# Patient Record
Sex: Female | Born: 1955
Health system: Southern US, Community
[De-identification: ages and names within clinical notes are randomized; demographics above are authoritative.]

## PROBLEM LIST (undated history)

## (undated) DIAGNOSIS — G43909 Migraine, unspecified, not intractable, without status migrainosus: Secondary | ICD-10-CM

## (undated) DIAGNOSIS — E785 Hyperlipidemia, unspecified: Secondary | ICD-10-CM

## (undated) DIAGNOSIS — R112 Nausea with vomiting, unspecified: Secondary | ICD-10-CM

## (undated) DIAGNOSIS — F329 Major depressive disorder, single episode, unspecified: Secondary | ICD-10-CM

## (undated) DIAGNOSIS — F32A Depression, unspecified: Secondary | ICD-10-CM

## (undated) DIAGNOSIS — T7840XA Allergy, unspecified, initial encounter: Secondary | ICD-10-CM

## (undated) DIAGNOSIS — B019 Varicella without complication: Secondary | ICD-10-CM

## (undated) DIAGNOSIS — R569 Unspecified convulsions: Secondary | ICD-10-CM

## (undated) DIAGNOSIS — F419 Anxiety disorder, unspecified: Secondary | ICD-10-CM

## (undated) DIAGNOSIS — Z8489 Family history of other specified conditions: Secondary | ICD-10-CM

## (undated) DIAGNOSIS — Z9889 Other specified postprocedural states: Secondary | ICD-10-CM

## (undated) DIAGNOSIS — R519 Headache, unspecified: Secondary | ICD-10-CM

## (undated) DIAGNOSIS — Z9289 Personal history of other medical treatment: Secondary | ICD-10-CM

## (undated) DIAGNOSIS — M810 Age-related osteoporosis without current pathological fracture: Secondary | ICD-10-CM

## (undated) DIAGNOSIS — R51 Headache: Secondary | ICD-10-CM

## (undated) DIAGNOSIS — I10 Essential (primary) hypertension: Secondary | ICD-10-CM

## (undated) DIAGNOSIS — K219 Gastro-esophageal reflux disease without esophagitis: Secondary | ICD-10-CM

## (undated) DIAGNOSIS — H269 Unspecified cataract: Secondary | ICD-10-CM

## (undated) DIAGNOSIS — M1990 Unspecified osteoarthritis, unspecified site: Secondary | ICD-10-CM

## (undated) DIAGNOSIS — J449 Chronic obstructive pulmonary disease, unspecified: Secondary | ICD-10-CM

## (undated) HISTORY — DX: Hyperlipidemia, unspecified: E78.5

## (undated) HISTORY — PX: EYE SURGERY: SHX253

## (undated) HISTORY — DX: Gastro-esophageal reflux disease without esophagitis: K21.9

## (undated) HISTORY — PX: WISDOM TOOTH EXTRACTION: SHX21

## (undated) HISTORY — DX: Varicella without complication: B01.9

## (undated) HISTORY — PX: HERNIA REPAIR: SHX51

## (undated) HISTORY — DX: Headache, unspecified: R51.9

## (undated) HISTORY — DX: Unspecified cataract: H26.9

## (undated) HISTORY — DX: Headache: R51

## (undated) HISTORY — DX: Age-related osteoporosis without current pathological fracture: M81.0

## (undated) HISTORY — DX: Chronic obstructive pulmonary disease, unspecified: J44.9

## (undated) HISTORY — DX: Unspecified osteoarthritis, unspecified site: M19.90

## (undated) HISTORY — PX: APPENDECTOMY: SHX54

## (undated) HISTORY — DX: Personal history of other medical treatment: Z92.89

## (undated) HISTORY — DX: Allergy, unspecified, initial encounter: T78.40XA

## (undated) HISTORY — DX: Depression, unspecified: F32.A

## (undated) HISTORY — DX: Migraine, unspecified, not intractable, without status migrainosus: G43.909

## (undated) HISTORY — DX: Essential (primary) hypertension: I10

## (undated) HISTORY — DX: Unspecified convulsions: R56.9

## (undated) HISTORY — DX: Anxiety disorder, unspecified: F41.9

## (undated) HISTORY — PX: CATARACT EXTRACTION, BILATERAL: SHX1313

## (undated) HISTORY — PX: JOINT REPLACEMENT: SHX530

## (undated) HISTORY — DX: Major depressive disorder, single episode, unspecified: F32.9

---

## 1982-02-03 HISTORY — PX: APPENDECTOMY: SHX54

## 1982-02-03 HISTORY — PX: TOTAL ABDOMINAL HYSTERECTOMY: SHX209

## 1982-02-03 HISTORY — PX: ABDOMINAL HYSTERECTOMY: SHX81

## 1999-06-03 ENCOUNTER — Encounter: Admission: RE | Admit: 1999-06-03 | Discharge: 1999-06-03 | Payer: Self-pay | Admitting: Obstetrics and Gynecology

## 1999-06-03 ENCOUNTER — Encounter: Payer: Self-pay | Admitting: Obstetrics and Gynecology

## 2005-02-14 ENCOUNTER — Emergency Department: Payer: Self-pay | Admitting: Emergency Medicine

## 2005-07-01 ENCOUNTER — Emergency Department: Payer: Self-pay | Admitting: Internal Medicine

## 2007-03-10 ENCOUNTER — Emergency Department: Payer: Self-pay | Admitting: Emergency Medicine

## 2007-06-09 ENCOUNTER — Ambulatory Visit: Payer: Self-pay

## 2009-05-06 ENCOUNTER — Emergency Department: Payer: Self-pay | Admitting: Emergency Medicine

## 2010-03-08 ENCOUNTER — Emergency Department: Payer: Self-pay | Admitting: Emergency Medicine

## 2010-09-15 ENCOUNTER — Emergency Department: Payer: Self-pay | Admitting: Unknown Physician Specialty

## 2011-01-30 ENCOUNTER — Emergency Department: Payer: Self-pay | Admitting: Emergency Medicine

## 2011-08-27 ENCOUNTER — Emergency Department: Payer: Self-pay | Admitting: Emergency Medicine

## 2011-08-27 LAB — CBC WITH DIFFERENTIAL/PLATELET
Basophil #: 0 10*3/uL (ref 0.0–0.1)
Basophil %: 0.4 %
Eosinophil #: 0.2 10*3/uL (ref 0.0–0.7)
Eosinophil %: 2.6 %
HCT: 42.5 % (ref 35.0–47.0)
HGB: 13.8 g/dL (ref 12.0–16.0)
Lymphocyte #: 1.7 10*3/uL (ref 1.0–3.6)
Lymphocyte %: 23.3 %
MCH: 32.3 pg (ref 26.0–34.0)
MCHC: 32.4 g/dL (ref 32.0–36.0)
MCV: 100 fL (ref 80–100)
Monocyte #: 0.3 x10 3/mm (ref 0.2–0.9)
Monocyte %: 4.3 %
Neutrophil #: 4.9 10*3/uL (ref 1.4–6.5)
Neutrophil %: 69.4 %
Platelet: 174 10*3/uL (ref 150–440)
RBC: 4.26 10*6/uL (ref 3.80–5.20)
RDW: 13.7 % (ref 11.5–14.5)
WBC: 7.1 10*3/uL (ref 3.6–11.0)

## 2011-08-27 LAB — COMPREHENSIVE METABOLIC PANEL
Albumin: 4 g/dL (ref 3.4–5.0)
Alkaline Phosphatase: 93 U/L (ref 50–136)
Anion Gap: 4 — ABNORMAL LOW (ref 7–16)
BUN: 14 mg/dL (ref 7–18)
Bilirubin,Total: 0.3 mg/dL (ref 0.2–1.0)
Calcium, Total: 8.7 mg/dL (ref 8.5–10.1)
Chloride: 106 mmol/L (ref 98–107)
Co2: 28 mmol/L (ref 21–32)
Creatinine: 0.82 mg/dL (ref 0.60–1.30)
EGFR (African American): >60
EGFR (Non-African Amer.): >60
Glucose: 108 mg/dL — ABNORMAL HIGH (ref 65–99)
Osmolality: 277 (ref 275–301)
Potassium: 3.8 mmol/L (ref 3.5–5.1)
SGOT(AST): 31 U/L (ref 15–37)
SGPT (ALT): 25 U/L
Sodium: 138 mmol/L (ref 136–145)
Total Protein: 8 g/dL (ref 6.4–8.2)

## 2012-02-04 ENCOUNTER — Emergency Department: Payer: Self-pay | Admitting: Emergency Medicine

## 2012-02-04 LAB — CBC WITH DIFFERENTIAL/PLATELET
Basophil #: 0.1 10*3/uL (ref 0.0–0.1)
Basophil %: 0.9 %
Eosinophil #: 0.1 10*3/uL (ref 0.0–0.7)
Eosinophil %: 0.9 %
HCT: 42.9 % (ref 35.0–47.0)
HGB: 15 g/dL (ref 12.0–16.0)
Lymphocyte #: 1.2 10*3/uL (ref 1.0–3.6)
Lymphocyte %: 16.9 %
MCH: 33.8 pg (ref 26.0–34.0)
MCHC: 35 g/dL (ref 32.0–36.0)
MCV: 97 fL (ref 80–100)
Monocyte #: 0.3 x10 3/mm (ref 0.2–0.9)
Monocyte %: 4.1 %
Neutrophil #: 5.2 10*3/uL (ref 1.4–6.5)
Neutrophil %: 77.2 %
Platelet: 179 10*3/uL (ref 150–440)
RBC: 4.44 10*6/uL (ref 3.80–5.20)
RDW: 13.1 % (ref 11.5–14.5)
WBC: 6.8 10*3/uL (ref 3.6–11.0)

## 2012-02-04 LAB — COMPREHENSIVE METABOLIC PANEL
Albumin: 4.3 g/dL (ref 3.4–5.0)
Alkaline Phosphatase: 88 U/L (ref 50–136)
Anion Gap: 12 (ref 7–16)
BUN: 10 mg/dL (ref 7–18)
Bilirubin,Total: 0.3 mg/dL (ref 0.2–1.0)
Calcium, Total: 9.3 mg/dL (ref 8.5–10.1)
Chloride: 104 mmol/L (ref 98–107)
Co2: 21 mmol/L (ref 21–32)
Creatinine: 1.08 mg/dL (ref 0.60–1.30)
EGFR (African American): >60
EGFR (Non-African Amer.): 58 — ABNORMAL LOW
Glucose: 88 mg/dL (ref 65–99)
Osmolality: 272 (ref 275–301)
Potassium: 3.1 mmol/L — ABNORMAL LOW (ref 3.5–5.1)
SGOT(AST): 20 U/L (ref 15–37)
SGPT (ALT): 14 U/L (ref 12–78)
Sodium: 137 mmol/L (ref 136–145)
Total Protein: 8.7 g/dL — ABNORMAL HIGH (ref 6.4–8.2)

## 2012-02-04 LAB — DRUG SCREEN, URINE
Amphetamines, Ur Screen: NEGATIVE (ref ?–1000)
Barbiturates, Ur Screen: NEGATIVE (ref ?–200)
Benzodiazepine, Ur Scrn: NEGATIVE (ref ?–200)
Cannabinoid 50 Ng, Ur ~~LOC~~: POSITIVE (ref ?–50)
Cocaine Metabolite,Ur ~~LOC~~: NEGATIVE (ref ?–300)
MDMA (Ecstasy)Ur Screen: NEGATIVE (ref ?–500)
Methadone, Ur Screen: NEGATIVE (ref ?–300)
Opiate, Ur Screen: NEGATIVE (ref ?–300)
Phencyclidine (PCP) Ur S: NEGATIVE (ref ?–25)
Tricyclic, Ur Screen: NEGATIVE (ref ?–1000)

## 2012-02-04 LAB — URINALYSIS, COMPLETE
Bacteria: NONE SEEN
Bilirubin,UR: NEGATIVE
Glucose,UR: NEGATIVE mg/dL (ref 0–75)
Hyaline Cast: 3
Ketone: NEGATIVE
Nitrite: NEGATIVE
Ph: 6 (ref 4.5–8.0)
Protein: 30
RBC,UR: 4 /HPF (ref 0–5)
Specific Gravity: 1.016 (ref 1.003–1.030)
Squamous Epithelial: 7
WBC UR: 6 /HPF (ref 0–5)

## 2012-07-01 ENCOUNTER — Inpatient Hospital Stay: Payer: Self-pay | Admitting: Internal Medicine

## 2012-07-01 LAB — COMPREHENSIVE METABOLIC PANEL
Albumin: 4.3 g/dL (ref 3.4–5.0)
Alkaline Phosphatase: 96 U/L (ref 50–136)
Anion Gap: 15 (ref 7–16)
BUN: 12 mg/dL (ref 7–18)
Bilirubin,Total: 0.3 mg/dL (ref 0.2–1.0)
Calcium, Total: 9 mg/dL (ref 8.5–10.1)
Chloride: 110 mmol/L — ABNORMAL HIGH (ref 98–107)
Co2: 16 mmol/L — ABNORMAL LOW (ref 21–32)
Creatinine: 1.26 mg/dL (ref 0.60–1.30)
EGFR (African American): 55 — ABNORMAL LOW
EGFR (Non-African Amer.): 48 — ABNORMAL LOW
Glucose: 132 mg/dL — ABNORMAL HIGH (ref 65–99)
Osmolality: 283 (ref 275–301)
Potassium: 3.3 mmol/L — ABNORMAL LOW (ref 3.5–5.1)
SGOT(AST): 43 U/L — ABNORMAL HIGH (ref 15–37)
SGPT (ALT): 21 U/L (ref 12–78)
Sodium: 141 mmol/L (ref 136–145)
Total Protein: 8.5 g/dL — ABNORMAL HIGH (ref 6.4–8.2)

## 2012-07-01 LAB — DRUG SCREEN, URINE
Barbiturates, Ur Screen: NEGATIVE (ref ?–200)
Benzodiazepine, Ur Scrn: NEGATIVE (ref ?–200)
Cannabinoid 50 Ng, Ur ~~LOC~~: POSITIVE (ref ?–50)
Cocaine Metabolite,Ur ~~LOC~~: NEGATIVE (ref ?–300)
MDMA (Ecstasy)Ur Screen: NEGATIVE (ref ?–500)
Methadone, Ur Screen: NEGATIVE (ref ?–300)
Opiate, Ur Screen: NEGATIVE (ref ?–300)
Phencyclidine (PCP) Ur S: NEGATIVE (ref ?–25)
Tricyclic, Ur Screen: NEGATIVE (ref ?–1000)

## 2012-07-01 LAB — CBC
HCT: 42.1 % (ref 35.0–47.0)
HGB: 14.1 g/dL (ref 12.0–16.0)
MCH: 33.1 pg (ref 26.0–34.0)
MCHC: 33.6 g/dL (ref 32.0–36.0)
MCV: 99 fL (ref 80–100)
Platelet: 231 10*3/uL (ref 150–440)
RBC: 4.27 10*6/uL (ref 3.80–5.20)
RDW: 13.8 % (ref 11.5–14.5)
WBC: 21.1 10*3/uL — ABNORMAL HIGH (ref 3.6–11.0)

## 2012-07-01 LAB — URINALYSIS, COMPLETE
Bacteria: NONE SEEN
Nitrite: NEGATIVE
Ph: 5 (ref 4.5–8.0)
Protein: NEGATIVE
RBC,UR: 6 /HPF (ref 0–5)
Squamous Epithelial: <1

## 2012-07-01 LAB — TSH: Thyroid Stimulating Horm: 1.37 u[IU]/mL

## 2012-07-01 LAB — MAGNESIUM: Magnesium: 1.7 mg/dL — ABNORMAL LOW

## 2012-07-01 LAB — ETHANOL: Ethanol: <3 mg/dL

## 2012-07-01 LAB — ACETAMINOPHEN LEVEL: Acetaminophen: <2 ug/mL

## 2012-07-02 LAB — BASIC METABOLIC PANEL
Anion Gap: 4 — ABNORMAL LOW (ref 7–16)
Calcium, Total: 8.5 mg/dL (ref 8.5–10.1)
Potassium: 3.7 mmol/L (ref 3.5–5.1)
Sodium: 141 mmol/L (ref 136–145)

## 2012-07-02 LAB — WBC: WBC: 12.8 10*3/uL — ABNORMAL HIGH (ref 3.6–11.0)

## 2012-07-03 ENCOUNTER — Ambulatory Visit: Payer: Self-pay | Admitting: Neurology

## 2012-07-19 ENCOUNTER — Ambulatory Visit: Payer: Self-pay | Admitting: Family Medicine

## 2013-02-03 LAB — HM MAMMOGRAPHY: HM MAMMO: NORMAL (ref 0–4)

## 2013-08-28 ENCOUNTER — Emergency Department: Payer: Self-pay | Admitting: Internal Medicine

## 2013-08-28 LAB — COMPREHENSIVE METABOLIC PANEL
ALBUMIN: 4.2 g/dL (ref 3.4–5.0)
ALK PHOS: 64 U/L
ANION GAP: 4 — AB (ref 7–16)
AST: 23 U/L (ref 15–37)
BUN: 13 mg/dL (ref 7–18)
Bilirubin,Total: 0.3 mg/dL (ref 0.2–1.0)
Calcium, Total: 8.8 mg/dL (ref 8.5–10.1)
Chloride: 101 mmol/L (ref 98–107)
Co2: 34 mmol/L — ABNORMAL HIGH (ref 21–32)
Creatinine: 1.27 mg/dL (ref 0.60–1.30)
EGFR (African American): 54 — ABNORMAL LOW
GFR CALC NON AF AMER: 47 — AB
Glucose: 91 mg/dL (ref 65–99)
Osmolality: 277 (ref 275–301)
Potassium: 3.5 mmol/L (ref 3.5–5.1)
SGPT (ALT): 16 U/L
SODIUM: 139 mmol/L (ref 136–145)
TOTAL PROTEIN: 7.8 g/dL (ref 6.4–8.2)

## 2013-08-28 LAB — CBC
HCT: 41.1 % (ref 35.0–47.0)
HGB: 13.7 g/dL (ref 12.0–16.0)
MCH: 33.1 pg (ref 26.0–34.0)
MCHC: 33.4 g/dL (ref 32.0–36.0)
MCV: 99 fL (ref 80–100)
Platelet: 194 10*3/uL (ref 150–440)
RBC: 4.16 10*6/uL (ref 3.80–5.20)
RDW: 13 % (ref 11.5–14.5)
WBC: 6.7 10*3/uL (ref 3.6–11.0)

## 2013-08-28 LAB — SEDIMENTATION RATE: Erythrocyte Sed Rate: 12 mm/hr (ref 0–30)

## 2013-08-29 DIAGNOSIS — F32A Depression, unspecified: Secondary | ICD-10-CM | POA: Insufficient documentation

## 2013-08-29 DIAGNOSIS — F329 Major depressive disorder, single episode, unspecified: Secondary | ICD-10-CM | POA: Insufficient documentation

## 2013-08-29 DIAGNOSIS — R9402 Abnormal brain scan: Secondary | ICD-10-CM | POA: Insufficient documentation

## 2013-09-24 ENCOUNTER — Emergency Department: Payer: Self-pay | Admitting: Emergency Medicine

## 2013-10-06 ENCOUNTER — Ambulatory Visit: Payer: Self-pay | Admitting: Neurology

## 2013-11-04 ENCOUNTER — Emergency Department: Payer: Self-pay | Admitting: Emergency Medicine

## 2014-01-10 ENCOUNTER — Ambulatory Visit: Payer: Self-pay | Admitting: Family Medicine

## 2014-05-26 NOTE — Discharge Summary (Signed)
PATIENT NAME:  Kayla Shah, Kayla Shah MR#:  [id number] DATE OF BIRTH:  1955-11-20  DATE OF ADMISSION:  07/01/2012 DATE OF DISCHARGE:  07/03/2012  PRIMARY CARE PHYSICIAN:  Dr. Brunetta Genera.   DISCHARGE DIAGNOSES: 1.  Generalized tonic-clonic seizure.  2.  Tobacco abuse.  4.  Depression.  5.  Postictal encephalopathy.   CONSULTATIONS:  Dr. Irish Elders of neurology.   IMAGING STUDIES DONE:  Include a CT scan of the head which showed no acute abnormalities.   PROCEDURES:  An EEG done showed abnormal occipital left temporal more than left parietal spikes and two short-lasting seizures with rapid secondary generalization.  This was consistent with cortical irritability and increased predisposition to focal onset epilepsy from these locations.   ADMITTING HISTORY AND PHYSICAL:  Please see detailed H and P dictated by Dr. Posey Pronto on 07/01/2012.  In brief, a 59 year old Caucasian female patient was brought to the Emergency Room by EMS after the patient's husband found her curled up on the floor for unknown amount of time.  The patient was confused and this was similar to her prior episode of having seizure six months prior, was brought to the Emergency Room.  In the Emergency Room, the patient had a generalized tonic-clonic seizure witnessed by the ER physician, was admitted to the hospitalist service for further work-up and treatment.   HOSPITAL COURSE:  The patient was started on Keppra IV twice daily as she was confused, unable to take anything oral after loading a Keppra dose.  The patient did not have any further seizures.  An EEG showed seizure-like activity after which Dr. Irish Elders of neurology was consulted who suggested patient can be discharged home as seizures had resolved, but she will need an MRI as outpatient and this was scheduled.  The patient will follow up with Dr. Manuella Ghazi of neurology in 2 to 3 weeks.  This appointment could not obtained as it is the weekend, but patient will call Dr. Trena Platt office,   The patient was advised to stop taking her tramadol which could have caused her seizure and lower the threshold of seizures.  Also, the patient was counseled extensively to quit using marijuana which could be the other cause of her precipitation of seizures.   Today the patient's neurological examination shows motor strength 5 by 5 in upper and lower extremities.  Sensation is intact all over.  The patient is alert and oriented x 3 and is being discharged home in a fair condition.   DISCHARGE MEDICATIONS:  Include:  1.  Simvastatin 20 mg oral once a day.  2.  Seroquel 50 mg oral once a day at bedtime.  3.  Celexa 20 mg oral once a day.  4.  Keppra 500 mg oral 2 times a day.  5.  Acetaminophen 650 mg oral every 4 hours as needed for pain or fever.   DISCHARGE INSTRUCTIONS:  Regular diet.  Activity as tolerated.  Call her doctor or return to the Emergency Room if any further seizures.  Follow up with Dr. Manuella Ghazi of neurology in 2 to 3 weeks.  The patient has been given a lab slip to check her MRI of the brain with and without contrast for which she will return and get the MRI.   Time spent on day of discharge in discharge activity was 40 minutes.     ____________________________ Leia Alf Kazuki Ingle, MD srs:ea D: 07/03/2012 15:36:42 ET T: 07/03/2012 20:06:26 ET JOB#: [id number]  cc: Alveta Heimlich R. Ellee Wawrzyniak, MD, <Dictator> Meindert A. Brunetta Genera, [place]  Arlice Colt MD ELECTRONICALLY SIGNED 07/27/2012 10:44

## 2014-05-26 NOTE — Consult Note (Signed)
PATIENT NAME:  Kayla Shah, Kayla Shah MR#:  [id number] DATE OF BIRTH:  July 22, 1955  DATE OF CONSULTATION:  07/03/2012  REFERRING PHYSICIAN:   CONSULTING PHYSICIAN:  Leotis Pain, MD  REASON FOR CONSULTATION:  Seizures.   HISTORY OF PRESENT ILLNESS: This is a 59 year old Caucasian female brought to Emergency Department via EMS after the patient's husband found her curled up on the floor, unknown amount of time. The patient was had altered mental status.  Upon presenting to the  Emergency Department, she appeared confused and had a generalized tonic-clonic seizure, which resolved with IV Ativan.  At that time, the patient appeared postictal and had difficulty following commands. She is status post Keppra load of 500 mg x1.  Throughout the course of the stay, the patient's symptoms are very much improved.  She appears to be close to baseline.  She has a history of chronic headaches and she has been on tramadol, which can lower seizure threshold, which is held at this time. She is also a chronic tobacco smoker of 1 pack per day. She smokes marijuana almost daily, or as she states, whenever she can afford it. She has a history of seizure that happened about six months ago.  She does remember the event and the type of seizure. Status post EEG that showed generalized spikes in the left  temporal, left occipital lobe with potentiation for seizure activity.   PAST MEDICAL HISTORY:  Depression, seizure disorder 6 months ago.  She has a suicide attempt in the past, history of total abdominal hysterectomy.   HOME MEDICATIONS: Include:  1.  Simvastatin.  2.  Seroquel.  3.  Celexa.  4. Tramadol, which is held at this point.   FAMILY HISTORY: No history of seizures.   SOCIAL HISTORY:  Per husband or the person who lives with her, she smokes about a pack per day. Denies any alcohol use. She is a daily marijuana smoker.   PHYSICAL EXAMINATION:  VITAL SIGNS: The patient's temperature is 99.1, pulse is 68,  respirations 18, blood pressure 129/79, pulse oximetry is 98%.   NEUROLOGICAL: The patient is alert, awake, oriented to time, place, and the reason why she is in the hospital. Cranial nerve examination:  The patient's sensation and motor are intact. Pupils 3 mm, 2 mm, reactive bilaterally. Extraocular movements are intact. Visual fields intact. Uvula elevates symmetrically. Tongue is midline. Shoulder shrug intact bilaterally. Strength is 5/5 bilateral upper extremities   Chronic weakness in the right lower extremity, which is 5- out of 5 and the left lower extremity is 5/5.  Coordination: Finger-to-nose intact. Sensation intact to light touch and temperature.  Reflexes symmetrical throughout. Gait not assessed.   IMAGING:  CAT scan of the head did not show any acute intracranial pathology. Status post EEG that showed left occipital sharps, potentiation for seizure activity. The patient's chemistry included glucose of 96, BUN of 12, creatinine of 0.89, chloride of 113.  The patient white blood cell count is 12.8, suspected because of seizure activity.   IMPRESSION: A 59 year old female presenting with generalized seizure activity.  This is  suspected a second seizure activity of her life, status post CAT scan did not show any acute abnormalities. EEG as above, potentiation for seizure activity in left temporal and left occipital lobes.   PLAN: The patient should be on antiepileptics, Keppra 500 mg b.i.d. as her renal function is within normal limits.  The patient does not drive but was told not to drive anyway. No swimming alone, seizure precautions.  MRI of the brain, which can be done as an outpatient with and without contrast.  Discontinue tramadol as tramadol lowers the seizure threshold. The patient should follow up with neurology as an outpatient.  The patient was also consulted in terms of smoking and was told not to smoke marijuana daily like she does.  She smokes 1 pack per day so she was told to try  to decrease it or use nicotine patch. This patient was discussed with the patient's primary team.  It was a pleasure seeing this patient.  ____________________________ Leotis Pain, MD yz:mw D: 07/03/2012 12:57:15 ET T: 07/03/2012 18:12:06 ET JOB#: [id number]  cc: Leotis Pain, MD, <Dictator> Leotis Pain MD ELECTRONICALLY SIGNED 08/07/2012 11:40

## 2014-05-26 NOTE — H&P (Signed)
PATIENT NAME:  ANYJAH, ROUNDTREE MR#:  [id number] DATE OF BIRTH:  [birth year]  DATE OF ADMISSION:  07/01/2012  PRIMARY CARE PHYSICIAN:  Dr.  Brunetta Genera  CHIEF COMPLAINT:  Altered mental status, possible seizures.   HISTORY OF PRESENT ILLNESS:  Miss Soots is a 59 year old Caucasian female, who is brought into the Emergency Room by EMS after patient's husband found her curled up on  the floor for unknown amount of time, with altered mental status, and brought to the Emergency Room. Thereafter,  remained confused and had a generalized convulsion in the Emergency Room, witnessed by the ER physician. She received IV Ativan. She is currently postictal during my evaluation; however, is getting restless and somewhat agitated, trying to pull IV lines and telemetry cords. The patient is being admitted for further evaluation and management.   Per husband, according to his knowledge, the patient does not do any drugs or drink alcohol. She is an excessive tobacco abuser. Per patient's husband, he does not think she has any suicidal ideation. He tells me she has "the most pleasant personality". The patient does not see a psychiatrist. Her depression is managed by a primary care physician.   PAST MEDICAL HISTORY: 1.  Depression.  2.  Seizure one time, last New Year's Eve, patient had a mild seizure, which was apparently thought to be due to her running out of her antidepressant meds.  3.  Suicidal attempt in the past.  4.  Depression.  5.  Total abdominal hysterectomy.   MEDICATIONS: 1.  Simvastatin 20 mg daily.  2.  Seroquel 50 mg at bedtime.  3.  Celexa 20 mg daily.  4.  Tramadol 50 mg 2 times a day.   ALLERGIES:  No known drug allergies.   SOCIAL HISTORY:  Per husband, smokes about a pack a day. Denies any alcohol use.   FAMILY HISTORY:  Unobtainable.  REVIEW OF SYSTEMS:  Unobtainable.  PHYSICAL EXAMINATION: GENERAL: The patient is unresponsive. Moves all extremities well, on sternal rub, she gets  somewhat agitated. She is afebrile, pulse is 96, blood pressure 145/77, sats are 96% on room air.  HEENT: Atraumatic, normocephalic. PERLA.  EOM intact. Oral mucosa is moist. There are some cracked, chapped lips present. I could not assess for tongue bite.  NECK:  Supple. No JVD. No carotid bruit.  RESPIRATORY:  Clear to auscultation bilaterally. No rales, rhonchi, respiratory distress or labored breathing.  CARDIOVASCULAR:  Both the heart sounds are normal. Rate, rhythm regular. PMI not lateralized. Chest nontender. Good pedal pulses, good femoral pulses. No lower extremity edema.  ABDOMEN:  Soft, benign. No organomegaly felt. Positive bowel sounds.  NEUROLOGIC:  Exam limited secondary to patient's altered mental status and being postictal. She moves all her extremities well. No obvious focal deficit noted.  SKIN:  Warm and dry. The patient does have some bruises over her knees and over her elbows.   LABS: Urinalysis negative for UTI. Urine drug screen positive for cannabinoids. CT of the head: No evidence of acute abnormality. CT of the cervical spine without contrast shows multilevel spondylolysis. No evidence of acute osseous abnormality. Serum acetaminophen less than 2.0. Creatinine is 1.26, sodium is 141, potassium is 1.3, chloride is 110. Her SGOT is 43. CBC within normal limits, except white count of 21.1.  Magnesium 1.7. Salicylates  9.4. TSH is 137. EKG: Sinus rhythm with voltage criteria of left ventricular hypertrophy, nonspecific ST-T abnormality.   ASSESSMENT:  A 59 year old [place], who presents to the Emergency Room with  altered mental status and found curled over on the floor by her husband, brought in with:   1.  Generalized seizures. The patient's seizure tonic convulsion was witnessed by ER MD. She is postictal at this time. Received IV Ativan. CT head is negative. Will admit patient on medical floor, take seizure precautions. Will give IV Keppra 500 mg b.i.d. Neurology  consultation in the morning. Will order EEG in the morning as well. This could be possibly tramadol-induced as well. I will hold off on it. No history of drug abuse or drug intake known to family. However, her drug screen was positive for cannabinoids. She also has mild elevated salicylate level, which we will continue to follow.   2.  Hypokalemia, hyperchloremia. I will give IV fluids, replace potassium, and follow up labs.   3.  Depression, on Seroquel. Will hold off on it at this time until patient is awake.   4. Tobacco abuse. Will counsel patient, once she is awake enough to follow commands.   5.  Deep vein thrombosis prophylaxis with subcu Lovenox.   Further workup according to patient's clinical course. Hospital admission plan was discussed with patient's family members, who were present in the Emergency Room.   TIME SPENT:  55 minutes.    ____________________________ Hart Rochester Posey Pronto, MD sap:mr D: 07/01/2012 19:44:49 ET T: 07/01/2012 20:11:34 ET JOB#: [id number]  cc: Yavuz Kirby A. Posey Pronto, MD, <Dictator> Meinder[place] A. Brunetta Genera, MD  Ilda Basset MD ELECTRONICALLY SIGNED 07/08/2012 19:35

## 2014-08-14 DIAGNOSIS — J4489 Other specified chronic obstructive pulmonary disease: Secondary | ICD-10-CM | POA: Insufficient documentation

## 2014-08-14 DIAGNOSIS — E7800 Pure hypercholesterolemia, unspecified: Secondary | ICD-10-CM | POA: Insufficient documentation

## 2014-08-14 DIAGNOSIS — I10 Essential (primary) hypertension: Secondary | ICD-10-CM | POA: Insufficient documentation

## 2014-08-14 DIAGNOSIS — J449 Chronic obstructive pulmonary disease, unspecified: Secondary | ICD-10-CM | POA: Insufficient documentation

## 2015-05-09 ENCOUNTER — Telehealth: Payer: Self-pay | Admitting: Family Medicine

## 2015-05-09 NOTE — Telephone Encounter (Signed)
Received a call from [facility]  requesting when pt. Come in for appt for new pt  For  You to order a colonoscopy .  [employer]  Call back # is  [phone].

## 2015-05-10 NOTE — Telephone Encounter (Signed)
Did this patient schedule an appt?  I would be happy to help her get her colonoscopy scheduled at her new patient appt. Thanks!

## 2015-06-07 ENCOUNTER — Ambulatory Visit (INDEPENDENT_AMBULATORY_CARE_PROVIDER_SITE_OTHER): Payer: Medicare Other | Admitting: Family Medicine

## 2015-06-07 ENCOUNTER — Encounter: Payer: Self-pay | Admitting: Family Medicine

## 2015-06-07 VITALS — BP 136/86 | HR 73 | Temp 98.9°F | Resp 16 | Ht 65.0 in | Wt 128.4 lb

## 2015-06-07 DIAGNOSIS — I10 Essential (primary) hypertension: Secondary | ICD-10-CM | POA: Diagnosis not present

## 2015-06-07 DIAGNOSIS — Z7189 Other specified counseling: Secondary | ICD-10-CM | POA: Diagnosis not present

## 2015-06-07 DIAGNOSIS — R569 Unspecified convulsions: Secondary | ICD-10-CM | POA: Diagnosis not present

## 2015-06-07 DIAGNOSIS — E7800 Pure hypercholesterolemia, unspecified: Secondary | ICD-10-CM

## 2015-06-07 DIAGNOSIS — F17200 Nicotine dependence, unspecified, uncomplicated: Secondary | ICD-10-CM | POA: Insufficient documentation

## 2015-06-07 DIAGNOSIS — F329 Major depressive disorder, single episode, unspecified: Secondary | ICD-10-CM | POA: Diagnosis not present

## 2015-06-07 DIAGNOSIS — K219 Gastro-esophageal reflux disease without esophagitis: Secondary | ICD-10-CM | POA: Insufficient documentation

## 2015-06-07 DIAGNOSIS — G43909 Migraine, unspecified, not intractable, without status migrainosus: Secondary | ICD-10-CM | POA: Insufficient documentation

## 2015-06-07 DIAGNOSIS — F32A Depression, unspecified: Secondary | ICD-10-CM

## 2015-06-07 DIAGNOSIS — Z1211 Encounter for screening for malignant neoplasm of colon: Secondary | ICD-10-CM

## 2015-06-07 DIAGNOSIS — E559 Vitamin D deficiency, unspecified: Secondary | ICD-10-CM

## 2015-06-07 DIAGNOSIS — Z720 Tobacco use: Secondary | ICD-10-CM

## 2015-06-07 DIAGNOSIS — Z1239 Encounter for other screening for malignant neoplasm of breast: Secondary | ICD-10-CM | POA: Diagnosis not present

## 2015-06-07 DIAGNOSIS — Z7689 Persons encountering health services in other specified circumstances: Secondary | ICD-10-CM

## 2015-06-07 MED ORDER — ALPRAZOLAM 1 MG PO TABS: 1.0000 mg | ORAL_TABLET | Freq: Three times a day (TID) | ORAL | Status: DC | PRN

## 2015-06-07 MED ORDER — VERAPAMIL HCL ER 240 MG PO TBCR: 240.0000 mg | EXTENDED_RELEASE_TABLET | Freq: Every day | ORAL | Status: DC

## 2015-06-07 MED ORDER — PANTOPRAZOLE SODIUM 40 MG PO TBEC: 40.0000 mg | DELAYED_RELEASE_TABLET | Freq: Every day | ORAL | Status: DC

## 2015-06-07 MED ORDER — SIMVASTATIN 20 MG PO TABS
20.0000 mg | ORAL_TABLET | Freq: Every day | ORAL | Status: DC
Start: 2015-06-07 — End: 2018-05-14

## 2015-06-07 MED ORDER — LEVETIRACETAM 500 MG PO TABS: 500.0000 mg | ORAL_TABLET | Freq: Two times a day (BID) | ORAL | Status: DC

## 2015-06-07 NOTE — Assessment & Plan Note (Signed)
Appears controlled with verapamil. Check CMET. Encouraged smoking cessation. Encouraged dash diet.

## 2015-06-07 NOTE — Patient Instructions (Signed)
Nausea- Try taking protonix in the morning first thing upon awakening. This may help nausea.   Depression and Anxiety: Let's try decreasing Xanax to 3 times daily while we wait for psychiatry. Continue not to take the seroquel at this time.   Please schedule mammogram at your convenience.   We will check labs. You can call Dr. Melrose Nakayama to determine if you need to continue the [place].

## 2015-06-07 NOTE — Assessment & Plan Note (Signed)
Encouraged smoking cessation. Referred to [facility]. Reviewed available resources.

## 2015-06-07 NOTE — Progress Notes (Signed)
Subjective:    Patient ID: Kayla Shah, female    DOB: [birth year], 60 y.o.   MRN: [id number]  HPI: Kayla Shah is a 60 y.o. female presenting on 06/07/2015 for Establish Care   HPI  Pt presents to establish care today. Previous care provider was Dr. Fulton Reek at [facility].   It has been 6 mos months since Her last PCP visit. Records from previous provider will be requested and reviewed. Current medical problems include:  Seizure disorder: Diagnosed 4 years ago. Currently on Keppra. Was being managed by her previous PCP. Has seen Dr. Melrose Nakayama in the past- has not seen since 2015. Last seizure was 4 years . Did EEG in office. Did not find anything.  Hypertension: Placed on BP medications 4 years ago. Doing well. Was placed verampil. Has helped with her headaches.  Acid Reflux: Diagnosed 5 years ago. Did not have EGD. Protonix controls symptoms. No dysphagia, regurg, or blood in stool or vomit. Has AM nausea. Takes protonix. Nausea in AM upon awakening.  Hyperlipidemia: Taking 20mg  simvastatin at bedtime. Has been on for 5-6 years. No leg cramping on myalgias. Tries to eat a heart healthy diet. Husband recently had an MI. Walks in the evenings. Does smoke.   Issue with frequent HA's- started verampil by Dr. Melrose Nakayama in 2015. Has not had issues in quite a while.  Arthritis: L knee and R foot. Doesn't bother her too badly. Stiff at times. Takes Aleve as needed.  Depression: Previously taking paxil- made her feel bad. Was placed on Seroquel by previous PCP- 2 seroquel per day is too much. Doesn't feel her antidepressants helps. Has been taking Xanax 4 times daily. Started on Valium in the 1980's. Endorse occasional dark thoughts. No plan for suicide. Does not want to die. Does not feel she is a danger to herself.   Disability- thinks it was due to mental state.  Health maintenance:  Smokes- 1/2 pack per day. Started smoking as a teenage. Smoked 1/2 PPD for 40 years.  Last mammogram- all normal.  Needs been 2 years.  Last pap Hysterectomy 1984- took cervix and ovaries. Last colonoscopy: Would like to do the cologuard.     Past Medical History  Diagnosis Date  . Allergy   . Arthritis   . Depression   . Headache   . GERD (gastroesophageal reflux disease)   . Hypertension   . Hyperlipidemia   . Migraine   . Seizure [facility])    Social History   Social History  . Marital Status: Divorced    Spouse Name: N/A  . Number of Children: N/A  . Years of Education: N/A   Occupational History  . Not on file.   Social History Main Topics  . Smoking status: Current Every Day Smoker -- 1.00 packs/day  . Smokeless tobacco: Not on file  . Alcohol Use: No  . Drug Use: No  . Sexual Activity: Not on file   Other Topics Concern  . Not on file   Social History Narrative  . No narrative on file   Family History  Problem Relation Age of Onset  . Stroke Mother   . Heart disease Father   . Osteoporosis Sister   . Kidney disease Sister    No current outpatient prescriptions on file prior to visit.   No current facility-administered medications on file prior to visit.    Review of Systems  Constitutional: Negative for fever and chills.  HENT: Negative.   Respiratory: Negative  for cough, chest tightness and wheezing.   Cardiovascular: Negative for chest pain and leg swelling.  Gastrointestinal: Positive for nausea. Negative for vomiting, abdominal pain, diarrhea and constipation.  Endocrine: Negative.  Negative for cold intolerance, heat intolerance, polydipsia, polyphagia and polyuria.  Genitourinary: Negative for dysuria and difficulty urinating.  Musculoskeletal: Negative.   Neurological: Negative for dizziness, seizures, light-headedness and numbness.  Psychiatric/Behavioral: Positive for dysphoric mood. The patient is nervous/anxious.    Per HPI unless specifically indicated above     Objective:    BP 136/86 mmHg  Pulse 73  Temp(Src) 98.9 F (37.2 C) (Oral)  Resp  16  Ht 5\' 5"  (1.651 m)  Wt 128 lb 6.4 oz (58.242 kg)  BMI 21.37 kg/m2  LMP   Wt Readings from Last 3 Encounters:  06/07/15 128 lb 6.4 oz (58.242 kg)    Depression screen PHQ 2/9 06/07/2015  Decreased Interest 1  Down, Depressed, Hopeless 1  PHQ - 2 Score 2  Altered sleeping 0  Tired, decreased energy 1  Change in appetite 1  Feeling bad or failure about yourself  1  Trouble concentrating 0  Moving slowly or fidgety/restless 1  Suicidal thoughts 1  PHQ-9 Score 7      Physical Exam  Constitutional: She is oriented to person, place, and time. She appears well-developed and well-nourished.  HENT:  Head: Normocephalic and atraumatic.  Neck: Neck supple.  Cardiovascular: Normal rate, regular rhythm and normal heart sounds.  Exam reveals no gallop and no friction rub.   No murmur heard. Pulmonary/Chest: Effort normal and breath sounds normal. She has no wheezes. She exhibits no tenderness.  Abdominal: Soft. Normal appearance and bowel sounds are normal. She exhibits no distension and no mass. There is no tenderness. There is no rebound and no guarding.  Musculoskeletal: Normal range of motion. She exhibits no edema or tenderness.  Lymphadenopathy:    She has no cervical adenopathy.  Neurological: She is alert and oriented to person, place, and time.  Skin: Skin is warm and dry.  Psychiatric: She has a normal mood and affect. Her speech is normal and behavior is normal. Judgment normal. Cognition and memory are normal. She expresses suicidal ideation. She expresses no suicidal plans.   Results for orders placed or performed in visit on 06/07/15  HM MAMMOGRAPHY  Result Value Ref Range   HM Mammogram Self Reported Normal 0-4 Bi-Rad, Self Reported Normal      Assessment & Plan:   Problem List Items Addressed This Visit      Cardiovascular and Mediastinum   Essential (primary) hypertension    Appears controlled with verapamil. Check CMET. Encouraged smoking cessation. Encouraged  dash diet.       Relevant Medications   verapamil (CALAN-SR) 240 MG CR tablet   simvastatin (ZOCOR) 20 MG tablet   Other Relevant Orders   Comprehensive metabolic panel     Digestive   Gastroesophageal reflux disease without esophagitis    Continue protonix. Check CBC. Trial of taking medication firs thing in AM to help with nausea. Consider GI referral if symptoms don't improve.       Relevant Medications   dimenhyDRINATE (DRAMAMINE) 50 MG tablet   pantoprazole (PROTONIX) 40 MG tablet     Other   Clinical depression    Pt does not want to take seroquel and stopped. Pt is currently on Xanax. Reviewed the potential long term risks of this medication and encouraged pt to seek psych consult to determine if there is another  medication that can control her symptoms. Have reduced Xanax dose with patient goal of weaning off medication if possible. Pt is aware this is not a long term medication from this office.  Unclear if clinical depression is true diagnosis since patient states she gets disability for her mental state. She is unable to tell me what her diagnosis was. Pt does feel she needs to see a therapist or psychiatrist to help with her symptoms. Referral to [facility] placed today.  Contract for safety made. Pt to ER if she feels a danger to herself. No active plan.       Relevant Medications   ALPRAZolam (XANAX) 1 MG tablet   Other Relevant Orders   Ambulatory referral to Psychiatry   Pure hypercholesterolemia    Check baseline lipid panel to determine if medication titration is needed.       Relevant Medications   verapamil (CALAN-SR) 240 MG CR tablet   simvastatin (ZOCOR) 20 MG tablet   Other Relevant Orders   Lipid panel   Seizure ([place])    Pt has not had a seizure in 4 years. Check Keppra levels. Recommend that the patient seek neurology consult to determine if this medication is needed. She will call to schedule follow-up with Dr. Melrose Nakayama.       Relevant Medications    levETIRAcetam (KEPPRA) 500 MG tablet   Other Relevant Orders   Levetiracetam level   CBC with Differential/Platelet   Smoker    Encouraged smoking cessation. Referred to [place] [facility]. Reviewed available resources.        Other Visit Diagnoses    Encounter to establish care    -  Primary    Colon cancer screening        Relevant Orders    Cologuard    Screening for breast cancer        Relevant Orders    MM Digital Screening    Vitamin D deficiency        Relevant Orders    VITAMIN D 25 Hydroxy (Vit-D Deficiency, Fractures)       Meds ordered this encounter  Medications  . DISCONTD: levETIRAcetam (KEPPRA) 500 MG tablet    Sig: Take by mouth.  . DISCONTD: pantoprazole (PROTONIX) 40 MG tablet    Sig: Take by mouth.  . DISCONTD: simvastatin (ZOCOR) 20 MG tablet    Sig: Take by mouth.  . DISCONTD: verapamil (CALAN-SR) 240 MG CR tablet    Sig: Take by mouth.  . DISCONTD: ALPRAZolam (XANAX) 1 MG tablet    Sig: Take 1 mg by mouth.  . DISCONTD: QUEtiapine (SEROQUEL) 25 MG tablet    Sig:   . Calcium-Vitamin D 600-200 MG-UNIT tablet    Sig: Take by mouth.  . Cholecalciferol (VITAMIN D-1000 MAX ST) 1000 units tablet    Sig: Take by mouth.  . Cyanocobalamin (B-12) 500 MCG SUBL    Sig: Take by mouth.  . Omega-3 Fatty Acids (FISH OIL PO)    Sig: Take by mouth.  . dimenhyDRINATE (DRAMAMINE) 50 MG tablet    Sig: Take 50 mg by mouth daily.  Marland Kitchen levETIRAcetam (KEPPRA) 500 MG tablet    Sig: Take 1 tablet (500 mg total) by mouth 2 (two) times daily.    Dispense:  180 tablet    Refill:  1    Order Specific Question:  Supervising Provider    Answer:  Arlis Porta [phone]  . verapamil (CALAN-SR) 240 MG CR tablet    Sig: Take  1 tablet (240 mg total) by mouth daily.    Dispense:  90 tablet    Refill:  3    Order Specific Question:  Supervising Provider    Answer:  Arlis Porta [phone]  . simvastatin (ZOCOR) 20 MG tablet    Sig: Take 1 tablet (20 mg total) by mouth daily  at 6 PM.    Dispense:  90 tablet    Refill:  3    Order Specific Question:  Supervising Provider    Answer:  Arlis Porta [phone]  . pantoprazole (PROTONIX) 40 MG tablet    Sig: Take 1 tablet (40 mg total) by mouth daily.    Dispense:  90 tablet    Refill:  3    Order Specific Question:  Supervising Provider    Answer:  Arlis Porta [phone]  . ALPRAZolam (XANAX) 1 MG tablet    Sig: Take 1 tablet (1 mg total) by mouth 3 (three) times daily as needed for anxiety.    Dispense:  90 tablet    Refill:  1    Order Specific Question:  Supervising Provider    Answer:  Arlis Porta [id number]      Follow up plan: Return in about 4 weeks (around 07/05/2015) for nausea.Marland Kitchen

## 2015-06-07 NOTE — Assessment & Plan Note (Addendum)
Pt does not want to take seroquel and stopped. Pt is currently on Xanax. Reviewed the potential long term risks of this medication and encouraged pt to seek psych consult to determine if there is another medication that can control her symptoms. Have reduced Xanax dose with patient goal of weaning off medication if possible. Pt is aware this is not a long term medication from this office.  Unclear if clinical depression is true diagnosis since patient states she gets disability for her mental state. She is unable to tell me what her diagnosis was. Pt does feel she needs to see a therapist or psychiatrist to help with her symptoms. Referral to [facility] placed today.  Contract for safety made. Pt to ER if she feels a danger to herself. No active plan.

## 2015-06-07 NOTE — Assessment & Plan Note (Signed)
Pt has not had a seizure in 4 years. Check Keppra levels. Recommend that the patient seek neurology consult to determine if this medication is needed. She will call to schedule follow-up with Dr. Melrose Nakayama.

## 2015-06-07 NOTE — Assessment & Plan Note (Signed)
Check baseline lipid panel to determine if medication titration is needed.

## 2015-06-07 NOTE — Assessment & Plan Note (Signed)
Continue protonix. Check CBC. Trial of taking medication firs thing in AM to help with nausea. Consider GI referral if symptoms don't improve.

## 2015-06-19 ENCOUNTER — Ambulatory Visit
Admission: RE | Admit: 2015-06-19 | Discharge: 2015-06-19 | Disposition: A | Discharge status: Home / Self Care | Payer: Medicare Other | Source: Ambulatory Visit | Attending: Family Medicine | Admitting: Family Medicine

## 2015-06-19 ENCOUNTER — Other Ambulatory Visit: Payer: Self-pay | Admitting: Family Medicine

## 2015-06-19 ENCOUNTER — Telehealth: Payer: Self-pay | Admitting: Family Medicine

## 2015-06-19 DIAGNOSIS — Z1231 Encounter for screening mammogram for malignant neoplasm of breast: Secondary | ICD-10-CM | POA: Diagnosis not present

## 2015-06-19 DIAGNOSIS — Z1239 Encounter for other screening for malignant neoplasm of breast: Secondary | ICD-10-CM

## 2015-06-19 NOTE — Telephone Encounter (Signed)
Pt needs to know what kind of labs [name] ordered for her because it matters when she goes to [place] she needs to know if it's a drug screen or fasting.  Please call [phone]

## 2015-06-19 NOTE — Telephone Encounter (Signed)
Left message that basic labs were ordered not a drug screen.

## 2015-06-22 DIAGNOSIS — R7309 Other abnormal glucose: Secondary | ICD-10-CM | POA: Diagnosis not present

## 2015-06-22 DIAGNOSIS — R569 Unspecified convulsions: Secondary | ICD-10-CM | POA: Diagnosis not present

## 2015-06-22 DIAGNOSIS — I10 Essential (primary) hypertension: Secondary | ICD-10-CM | POA: Diagnosis not present

## 2015-06-22 DIAGNOSIS — E7800 Pure hypercholesterolemia, unspecified: Secondary | ICD-10-CM | POA: Diagnosis not present

## 2015-06-22 DIAGNOSIS — E559 Vitamin D deficiency, unspecified: Secondary | ICD-10-CM | POA: Diagnosis not present

## 2015-06-25 LAB — COMPREHENSIVE METABOLIC PANEL
A/G RATIO: 1.6 (ref 1.2–2.2)
ALBUMIN: 4.7 g/dL (ref 3.5–5.5)
ALT: 8 IU/L (ref 0–32)
AST: 15 IU/L (ref 0–40)
Alkaline Phosphatase: 66 IU/L (ref 39–117)
BUN / CREAT RATIO: 12 (ref 9–23)
BUN: 12 mg/dL (ref 6–24)
Bilirubin Total: 0.3 mg/dL (ref 0.0–1.2)
CALCIUM: 9.5 mg/dL (ref 8.7–10.2)
CO2: 21 mmol/L (ref 18–29)
CREATININE: 0.97 mg/dL (ref 0.57–1.00)
Chloride: 98 mmol/L (ref 96–106)
GFR, EST AFRICAN AMERICAN: 74 mL/min/{1.73_m2} (ref >59)
GFR, EST NON AFRICAN AMERICAN: 64 mL/min/{1.73_m2} (ref >59)
GLUCOSE: 111 mg/dL — AB (ref 65–99)
Globulin, Total: 2.9 g/dL (ref 1.5–4.5)
POTASSIUM: 4.2 mmol/L (ref 3.5–5.2)
SODIUM: 143 mmol/L (ref 134–144)
TOTAL PROTEIN: 7.6 g/dL (ref 6.0–8.5)

## 2015-06-25 LAB — LIPID PANEL
CHOL/HDL RATIO: 3.5 ratio (ref 0.0–4.4)
Cholesterol, Total: 189 mg/dL (ref 100–199)
HDL: 54 mg/dL (ref >39)
LDL CALC: 111 mg/dL — AB (ref 0–99)
Triglycerides: 120 mg/dL (ref 0–149)
VLDL Cholesterol Cal: 24 mg/dL (ref 5–40)

## 2015-06-25 LAB — VITAMIN D 25 HYDROXY (VIT D DEFICIENCY, FRACTURES): VIT D 25 HYDROXY: 47.4 ng/mL (ref 30.0–100.0)

## 2015-06-25 LAB — CBC WITH DIFFERENTIAL/PLATELET
BASOS ABS: 0 10*3/uL (ref 0.0–0.2)
Basos: 1 %
EOS (ABSOLUTE): 0.2 10*3/uL (ref 0.0–0.4)
Eos: 3 %
Hematocrit: 39.4 % (ref 34.0–46.6)
Hemoglobin: 13.6 g/dL (ref 11.1–15.9)
IMMATURE GRANS (ABS): 0 10*3/uL (ref 0.0–0.1)
IMMATURE GRANULOCYTES: 0 %
LYMPHS: 33 %
Lymphocytes Absolute: 2.1 10*3/uL (ref 0.7–3.1)
MCH: 33 pg (ref 26.6–33.0)
MCHC: 34.5 g/dL (ref 31.5–35.7)
MCV: 96 fL (ref 79–97)
MONOS ABS: 0.4 10*3/uL (ref 0.1–0.9)
Monocytes: 6 %
NEUTROS ABS: 3.6 10*3/uL (ref 1.4–7.0)
NEUTROS PCT: 57 %
PLATELETS: 208 10*3/uL (ref 150–379)
RBC: 4.12 x10E6/uL (ref 3.77–5.28)
RDW: 14 % (ref 12.3–15.4)
WBC: 6.3 10*3/uL (ref 3.4–10.8)

## 2015-06-25 LAB — LEVETIRACETAM LEVEL: LEVETIRACETAM: 8.6 ug/mL — AB (ref 10.0–40.0)

## 2015-06-28 LAB — HGB A1C W/O EAG: Hgb A1c MFr Bld: 5.9 % — ABNORMAL HIGH (ref 4.8–5.6)

## 2015-06-28 LAB — SPECIMEN STATUS REPORT

## 2015-07-06 ENCOUNTER — Ambulatory Visit (INDEPENDENT_AMBULATORY_CARE_PROVIDER_SITE_OTHER): Payer: 59 | Admitting: Psychiatry

## 2015-07-06 ENCOUNTER — Encounter: Payer: Self-pay | Admitting: Psychiatry

## 2015-07-06 VITALS — BP 118/82 | HR 56 | Temp 97.5°F | Ht 65.0 in | Wt 134.8 lb

## 2015-07-06 DIAGNOSIS — F1394 Sedative, hypnotic or anxiolytic use, unspecified with sedative, hypnotic or anxiolytic-induced mood disorder: Secondary | ICD-10-CM | POA: Diagnosis not present

## 2015-07-06 DIAGNOSIS — F329 Major depressive disorder, single episode, unspecified: Secondary | ICD-10-CM | POA: Diagnosis not present

## 2015-07-06 DIAGNOSIS — F32A Depression, unspecified: Secondary | ICD-10-CM

## 2015-07-06 DIAGNOSIS — F4001 Agoraphobia with panic disorder: Secondary | ICD-10-CM

## 2015-07-06 MED ORDER — BUSPIRONE HCL 5 MG PO TABS: 5.0000 mg | ORAL_TABLET | Freq: Two times a day (BID) | ORAL | Status: DC

## 2015-07-06 MED ORDER — ALPRAZOLAM 1 MG PO TABS: 1.0000 mg | ORAL_TABLET | Freq: Two times a day (BID) | ORAL | Status: DC | PRN

## 2015-07-06 NOTE — Progress Notes (Signed)
Psychiatric Initial Adult Assessment   Patient Identification: Kayla Shah MRN:  [id number] Date of Evaluation:  07/06/2015 Referral Source: [facility]  Chief Complaint:   Chief Complaint    Establish Care; Depression; Anxiety; Panic Attack     Visit Diagnosis:    ICD-9-CM ICD-10-CM   1. Panic disorder with agoraphobia and mild panic attacks 300.21 F40.01   2. Sedative, hypnotic or anxiolytic-induced mood disorder (HCC) 292.84 F13.94    E980.2      History of Present Illness:   Patient is a 60 year old female who presented for initial assessment. Patient was referred by her primary care physician at [facility]. She reported that she has history of anxiety and and was prescribed Xanax 1 mg T times daily by her primary care physician. Patient reported that she has tried several psycho topic medications in the past and she does not want to switch her medication. She reported that she has history of anxiety and has been having issues with her medications. Previously she has tried Paxil which made her feel bad. She was also placed on Seroquel by her previous primary care physician. She doesn't feel that the antidepressants helped her. She was previously taking Xanax 4 times daily. She was also given Valium in the 1980s. She occasionally endorse impulsivity and anger and more swings. She reported that she wants to stay in the house most of  the time.  Patient currently denied having any suicidal ideations or plans. She denied having any perceptual disturbances.  Associated Signs/Symptoms: Depression Symptoms:  difficulty concentrating, impaired memory, anxiety, disturbed sleep, (Hypo) Manic Symptoms:  Impulsivity, Irritable Mood, Labiality of Mood, Anxiety Symptoms:  Excessive Worry, Panic Symptoms, Psychotic Symptoms:  denied  PTSD Symptoms: Negative NA  Past Psychiatric History:  She denied any previous psychiatric hospitalization. She has never  attempted suicide in the past.   Previous Psychotropic Medications:  Seroquel- Sleeping too much Prozac Paxil made her homidical    Substance Abuse History in the last 12 months:  No. None reported Consequences of Substance Abuse: Negative NA  Past Medical History:  Past Medical History  Diagnosis Date  . Allergy   . Arthritis   . Depression   . Headache   . GERD (gastroesophageal reflux disease)   . Hypertension   . Hyperlipidemia   . Migraine   . Seizure ([place])   . Diabetes mellitus, type II [facility])     Past Surgical History  Procedure Laterality Date  . Abdominal hysterectomy  1984    Family Psychiatric History:  Father - raging alcoholic Niece - raging crack addict   Family History:  Family History  Problem Relation Age of Onset  . Stroke Mother   . Depression Mother   . Heart disease Father   . Alcohol abuse Father   . Osteoporosis Sister   . Kidney disease Sister     Social History:   Social History   Social History  . Marital Status: Divorced    Spouse Name: N/A  . Number of Children: N/A  . Years of Education: N/A   Social History Main Topics  . Smoking status: Current Every Day Smoker -- 1.00 packs/day  . Smokeless tobacco: None  . Alcohol Use: No  . Drug Use: No  . Sexual Activity: No   Other Topics Concern  . None   Social History Narrative    Additional Social History:  Married x 35 years. Has a step son. Has 2 grown grand children.  Allergies:   Allergies  Allergen Reactions  . Paroxetine Hcl Other (See Comments)    Pt. Becomes very aggressive with mood swings.  . Codeine Nausea And Vomiting  . Fluoxetine Anxiety  . Paroxetine Anxiety    Metabolic Disorder Labs: Lab Results  Component Value Date   HGBA1C 5.9* 06/22/2015   No results found for: PROLACTIN Lab Results  Component Value Date   CHOL 189 06/22/2015   TRIG 120 06/22/2015   HDL 54 06/22/2015   CHOLHDL 3.5 06/22/2015   LDLCALC 111* 06/22/2015      Current Medications: Current Outpatient Prescriptions  Medication Sig Dispense Refill  . ALPRAZolam (XANAX) 1 MG tablet Take 1 tablet (1 mg total) by mouth 3 (three) times daily as needed for anxiety. 90 tablet 1  . Calcium-Vitamin D 600-200 MG-UNIT tablet Take by mouth.    . Cholecalciferol (VITAMIN D-1000 MAX ST) 1000 units tablet Take by mouth.    . Cyanocobalamin (B-12) 500 MCG SUBL Take by mouth.    . levETIRAcetam (KEPPRA) 500 MG tablet Take 1 tablet (500 mg total) by mouth 2 (two) times daily. 180 tablet 1  . Omega-3 Fatty Acids (FISH OIL PO) Take by mouth.    . pantoprazole (PROTONIX) 40 MG tablet Take 1 tablet (40 mg total) by mouth daily. 90 tablet 3  . simvastatin (ZOCOR) 20 MG tablet Take 1 tablet (20 mg total) by mouth daily at 6 PM. 90 tablet 3  . verapamil (CALAN-SR) 240 MG CR tablet Take 1 tablet (240 mg total) by mouth daily. 90 tablet 3   No current facility-administered medications for this visit.    Neurologic: Headache: No Seizure: Yes Paresthesias:No  Musculoskeletal: Strength & Muscle Tone: within normal limits Gait & Station: normal Patient leans: N/A  Psychiatric Specialty Exam: ROS  Blood pressure 118/82, pulse 56, temperature 97.5 F (36.4 C), temperature source Tympanic, height 5\' 5"  (1.651 m), weight 134 lb 12.8 oz (61.145 kg), SpO2 95 %.Body mass index is 22.43 kg/(m^2).  General Appearance: Casual  Eye Contact:  Fair  Speech:  Clear and Coherent  Volume:  Normal  Mood:  Anxious  Affect:  Appropriate  Thought Process:  Coherent  Orientation:  Full (Time, Place, and Person)  Thought Content:  WDL  Suicidal Thoughts:  No  Homicidal Thoughts:  No  Memory:  Immediate;   Fair Recent;   Fair Remote;   Fair  Judgement:  Fair  Insight:  Fair  Psychomotor Activity:  Normal  Concentration:  Concentration: Fair and Attention Span: Fair  Recall:  [employer] of Knowledge:Fair  Language: Fair  Akathisia:  No  Handed:  Right  AIMS (if  indicated):    Assets:  Communication Skills Desire for Improvement Physical Health Social Support  ADL's:  Intact  Cognition: WNL  Sleep:      Treatment Plan Summary: Medication management   Discussed with her at length about the medications treatment risks benefits and alternatives. She will be started on Xanax 1 mg by mouth twice a day to help with her anxiety symptoms. Advised patient that the xanax work on the same receptors in the brain as well as alcohol and she demonstrated understanding  I will also start her on Buspar 5 mg by mouth twice a day for her anxiety symptoms and she will take it on alternate basis with Xanax.  Discussed about starting her on mood stabilizer at her next visit and she agreed with the plan.  Follow-up in 3 weeks  More than 50% of the time spent in psychoeducation, counseling and coordination of care.    This note was generated in part or whole with voice recognition software. Voice regonition is usually quite accurate but there are transcription errors that can and very often do occur. I apologize for any typographical errors that were not detected and corrected.    Rainey Pines, MD 6/2/20179:33 AM

## 2015-07-10 ENCOUNTER — Telehealth: Payer: Self-pay

## 2015-07-10 ENCOUNTER — Telehealth: Payer: Self-pay | Admitting: Family Medicine

## 2015-07-10 NOTE — Telephone Encounter (Signed)
pt called states that she can not take the buspar.  it makes her sick, she is more nervous, and nausea and she can not take this she doesn't like how it makes her feel

## 2015-07-10 NOTE — Telephone Encounter (Signed)
Left detailed message with recommendations.[place]

## 2015-07-10 NOTE — Telephone Encounter (Signed)
Looks like psychiatry started her on this medication. I recommend she call and speak with them if she feels she cannot tolerate medication. General tips for meds that bother stomach are to take with a meal. If she is taking twice daily, just take with breakfast and dinner. Thanks! AK

## 2015-07-10 NOTE — Telephone Encounter (Signed)
Pt. Called states that  Her medicine for depression  (Buspar) was making her sick on her stomach wanted to know what she needed to do. Pt call  bck # is  [phone]

## 2015-07-11 NOTE — Telephone Encounter (Signed)
Advise pt to take Buspar 5mg   In evening with food. Titrate slowly next week.  Will follow as scheduled.

## 2015-07-12 ENCOUNTER — Encounter: Payer: Self-pay | Admitting: Family Medicine

## 2015-07-12 ENCOUNTER — Ambulatory Visit (INDEPENDENT_AMBULATORY_CARE_PROVIDER_SITE_OTHER): Payer: Medicare Other | Admitting: Family Medicine

## 2015-07-12 VITALS — BP 112/78 | HR 58 | Temp 98.0°F | Resp 16 | Ht 65.0 in | Wt 136.0 lb

## 2015-07-12 DIAGNOSIS — Z13820 Encounter for screening for osteoporosis: Secondary | ICD-10-CM

## 2015-07-12 DIAGNOSIS — F329 Major depressive disorder, single episode, unspecified: Secondary | ICD-10-CM

## 2015-07-12 DIAGNOSIS — R569 Unspecified convulsions: Secondary | ICD-10-CM

## 2015-07-12 DIAGNOSIS — K219 Gastro-esophageal reflux disease without esophagitis: Secondary | ICD-10-CM | POA: Diagnosis not present

## 2015-07-12 DIAGNOSIS — F32A Depression, unspecified: Secondary | ICD-10-CM

## 2015-07-12 DIAGNOSIS — F418 Other specified anxiety disorders: Secondary | ICD-10-CM | POA: Diagnosis not present

## 2015-07-12 DIAGNOSIS — F419 Anxiety disorder, unspecified: Secondary | ICD-10-CM

## 2015-07-12 NOTE — Assessment & Plan Note (Signed)
Symptoms much improved with AM protonix. Will continue current regimen.

## 2015-07-12 NOTE — Assessment & Plan Note (Signed)
Urged pt to follow recommendations of psychiatry. As her PCP I will not change medications or deviate from psychiatry recommendations. Pt told this could be dangerous for her. Recommend taking home nausea medication 30 minutes prior to her buspar to help with symptoms.   I spent >50% of the visit face to face counseling this patient about her anxiety plan of care.

## 2015-07-12 NOTE — Patient Instructions (Addendum)
I would urge you to keep following the recommendations of psychiatry in regards to your anxiety. Try taking dramamine 30 minutes prior to your Buspar to help with nausea.

## 2015-07-12 NOTE — Assessment & Plan Note (Signed)
Placed a referral to neurology today to determine if patient still needs medication. She had 1 seizure 4 years ago per her report. Has since been on keppra. No additional seizures. Was previously taking only once daily and her levels were low.

## 2015-07-12 NOTE — Progress Notes (Signed)
Subjective:    Patient ID: Kayla Shah, female    DOB: 02/27/1955, 60 y.o.   MRN: [id number]  HPI: Kayla Shah is a 60 y.o. female presenting on 07/12/2015 for Nausea   HPI  Patient presents today to follow up on nausea in the AM. Has started taking her Protonix very early and has seen major improvement and has not had to use her Dramamine at all recently. Denies any other issues at this time. Has cologuard at home. Has had some constipation. Is waiting unti she is not taking laxatives to do. Had several questions about her anxiety medications and treatment. Pt saw psychiatry last Friday. The Buspar made her nauseated. She has not been taking. She has been taking her Xanax TID.   Past Medical History  Diagnosis Date  . Allergy   . Arthritis   . Depression   . Headache   . GERD (gastroesophageal reflux disease)   . Hypertension   . Hyperlipidemia   . Migraine   . Seizure ([place])   . Diabetes mellitus, type II [facility])     Current Outpatient Prescriptions on File Prior to Visit  Medication Sig  . ALPRAZolam (XANAX) 1 MG tablet Take 1 tablet (1 mg total) by mouth 2 (two) times daily as needed for anxiety.  . busPIRone (BUSPAR) 5 MG tablet Take 1 tablet (5 mg total) by mouth 2 (two) times daily. (Patient not taking: Reported on 07/12/2015)  . Calcium-Vitamin D 600-200 MG-UNIT tablet Take by mouth.  . Cholecalciferol (VITAMIN D-1000 MAX ST) 1000 units tablet Take by mouth.  . Cyanocobalamin (B-12) 500 MCG SUBL Take by mouth.  . levETIRAcetam (KEPPRA) 500 MG tablet Take 1 tablet (500 mg total) by mouth 2 (two) times daily.  . Omega-3 Fatty Acids (FISH OIL PO) Take by mouth.  . pantoprazole (PROTONIX) 40 MG tablet Take 1 tablet (40 mg total) by mouth daily.  . simvastatin (ZOCOR) 20 MG tablet Take 1 tablet (20 mg total) by mouth daily at 6 PM.  . verapamil (CALAN-SR) 240 MG CR tablet Take 1 tablet (240 mg total) by mouth daily.   No current facility-administered medications on  file prior to visit.    Review of Systems  Constitutional: Negative for fever and chills.  HENT: Negative.   Eyes: Negative.   Respiratory: Negative for cough, chest tightness, shortness of breath and wheezing.   Cardiovascular: Negative for chest pain and leg swelling.  Gastrointestinal: Negative for nausea, vomiting, abdominal pain, diarrhea and constipation.  Endocrine: Negative.  Negative for cold intolerance, heat intolerance, polydipsia, polyphagia and polyuria.  Genitourinary: Negative for dysuria and difficulty urinating.  Musculoskeletal: Negative.   Neurological: Negative for dizziness, light-headedness and numbness.  Psychiatric/Behavioral: Negative for suicidal ideas. The patient is nervous/anxious.    Per HPI unless specifically indicated above     Objective:    BP 112/78 mmHg  Pulse 58  Temp(Src) 98 F (36.7 C) (Oral)  Resp 16  Ht 5\' 5"  (1.651 m)  Wt 136 lb (61.689 kg)  BMI 22.63 kg/m2  Wt Readings from Last 3 Encounters:  07/12/15 136 lb (61.689 kg)  07/06/15 134 lb 12.8 oz (61.145 kg)  06/07/15 128 lb 6.4 oz (58.242 kg)    Physical Exam  Constitutional: She is oriented to person, place, and time. She appears well-developed and well-nourished.  HENT:  Head: Normocephalic and atraumatic.  Neck: Neck supple.  Cardiovascular: Normal rate, regular rhythm and normal heart sounds.  Exam reveals no gallop and no  friction rub.   No murmur heard. Pulmonary/Chest: Effort normal and breath sounds normal. She has no wheezes. She exhibits no tenderness.  Abdominal: Soft. Normal appearance and bowel sounds are normal. She exhibits no distension and no mass. There is no tenderness. There is no rebound and no guarding.  Musculoskeletal: Normal range of motion. She exhibits no edema or tenderness.  Lymphadenopathy:    She has no cervical adenopathy.  Neurological: She is alert and oriented to person, place, and time.  Skin: Skin is warm and dry.  Psychiatric: She has a  normal mood and affect. Her behavior is normal. Judgment and thought content normal.   Results for orders placed or performed in visit on 06/07/15  HM MAMMOGRAPHY  Result Value Ref Range   HM Mammogram Self Reported Normal 0-4 Bi-Rad, Self Reported Normal  Levetiracetam level  Result Value Ref Range   Levetiracetam Lvl 8.6 (L) 10.0 - 40.0 ug/mL  Comprehensive metabolic panel  Result Value Ref Range   Glucose 111 (H) 65 - 99 mg/dL   BUN 12 6 - 24 mg/dL   Creatinine, Ser 0.97 0.57 - 1.00 mg/dL   GFR calc non Af Amer 64 >59 mL/min/1.73   GFR calc Af Amer 74 >59 mL/min/1.73   BUN/Creatinine Ratio 12 9 - 23   Sodium 143 134 - 144 mmol/L   Potassium 4.2 3.5 - 5.2 mmol/L   Chloride 98 96 - 106 mmol/L   CO2 21 18 - 29 mmol/L   Calcium 9.5 8.7 - 10.2 mg/dL   Total Protein 7.6 6.0 - 8.5 g/dL   Albumin 4.7 3.5 - 5.5 g/dL   Globulin, Total 2.9 1.5 - 4.5 g/dL   Albumin/Globulin Ratio 1.6 1.2 - 2.2   Bilirubin Total 0.3 0.0 - 1.2 mg/dL   Alkaline Phosphatase 66 39 - 117 IU/L   AST 15 0 - 40 IU/L   ALT 8 0 - 32 IU/L  Lipid panel  Result Value Ref Range   Cholesterol, Total 189 100 - 199 mg/dL   Triglycerides 120 0 - 149 mg/dL   HDL 54 >39 mg/dL   VLDL Cholesterol Cal 24 5 - 40 mg/dL   LDL Calculated 111 (H) 0 - 99 mg/dL   Chol/HDL Ratio 3.5 0.0 - 4.4 ratio units  CBC with Differential/Platelet  Result Value Ref Range   WBC 6.3 3.4 - 10.8 x10E3/uL   RBC 4.12 3.77 - 5.28 x10E6/uL   Hemoglobin 13.6 11.1 - 15.9 g/dL   Hematocrit 39.4 34.0 - 46.6 %   MCV 96 79 - 97 fL   MCH 33.0 26.6 - 33.0 pg   MCHC 34.5 31.5 - 35.7 g/dL   RDW 14.0 12.3 - 15.4 %   Platelets 208 150 - 379 x10E3/uL   Neutrophils 57 %   Lymphs 33 %   Monocytes 6 %   Eos 3 %   Basos 1 %   Neutrophils Absolute 3.6 1.4 - 7.0 x10E3/uL   Lymphocytes Absolute 2.1 0.7 - 3.1 x10E3/uL   Monocytes Absolute 0.4 0.1 - 0.9 x10E3/uL   EOS (ABSOLUTE) 0.2 0.0 - 0.4 x10E3/uL   Basophils Absolute 0.0 0.0 - 0.2 x10E3/uL   Immature  Granulocytes 0 %   Immature Grans (Abs) 0.0 0.0 - 0.1 x10E3/uL  VITAMIN D 25 Hydroxy (Vit-D Deficiency, Fractures)  Result Value Ref Range   Vit D, 25-Hydroxy 47.4 30.0 - 100.0 ng/mL  Hgb A1c w/o eAG  Result Value Ref Range   Hgb A1c MFr Bld 5.9 (H)  4.8 - 5.6 %  Specimen status report  Result Value Ref Range   specimen status report Comment       Assessment & Plan:   Problem List Items Addressed This Visit      Digestive   Gastroesophageal reflux disease without esophagitis - Primary    Symptoms much improved with AM protonix. Will continue current regimen.         Other   Clinical depression    Urged pt to follow recommendations of psychiatry. As her PCP I will not change medications or deviate from psychiatry recommendations. Pt told this could be dangerous for her. Recommend taking home nausea medication 30 minutes prior to her buspar to help with symptoms.   I spent >50% of the visit face to face counseling this patient about her anxiety plan of care.       Seizure [facility]    Placed a referral to neurology today to determine if patient still needs medication. She had 1 seizure 4 years ago per her report. Has since been on keppra. No additional seizures. Was previously taking only once daily and her levels were low.       Relevant Orders   Ambulatory referral to Neurology    Other Visit Diagnoses    Screening for osteoporosis        Pt would like DXA scan.     Relevant Orders    DG Bone Density       No orders of the defined types were placed in this encounter.      Follow up plan: Return in about 3 months (around 10/12/2015) for HTN, needs spirometry.

## 2015-07-16 ENCOUNTER — Telehealth: Payer: Self-pay | Admitting: Family Medicine

## 2015-07-16 NOTE — Telephone Encounter (Signed)
Pt. Called requesting an order for her Bone Density test

## 2015-07-16 NOTE — Telephone Encounter (Signed)
Please inform Kayla Shah that she does have an order for her bone density was placed on Friday. She needs to call [place] [phone] to schedule at her leisure. Thanks! AK

## 2015-07-16 NOTE — Telephone Encounter (Signed)
Patient informed.[place]

## 2015-07-24 ENCOUNTER — Ambulatory Visit: Payer: Medicare Other | Admitting: Psychiatry

## 2015-08-14 ENCOUNTER — Ambulatory Visit: Payer: Medicare Other | Admitting: Psychiatry

## 2015-09-05 ENCOUNTER — Ambulatory Visit: Payer: Medicare Other | Admitting: Psychiatry

## 2015-10-10 ENCOUNTER — Telehealth: Payer: Self-pay | Admitting: *Deleted

## 2015-10-10 NOTE — Telephone Encounter (Signed)
Patient contacted me with self referral for initial lung cancer screening scan. Contacted patient and obtained smoking history,(current smoker, 45 pack year) as well as answering questions related to screening process. Patient denies signs of lung cancer such as weight loss or hemoptysis. Patient denies comorbidity that would prevent curative treatment if lung cancer were found. Patient is tentatively scheduled for shared decision making visit and CT scan on 10/16/15 at 1:30pm, pending insurance approval from business office.

## 2015-10-11 ENCOUNTER — Ambulatory Visit (INDEPENDENT_AMBULATORY_CARE_PROVIDER_SITE_OTHER): Payer: Medicare Other | Admitting: Family Medicine

## 2015-10-11 ENCOUNTER — Encounter: Payer: Self-pay | Admitting: Family Medicine

## 2015-10-11 ENCOUNTER — Ambulatory Visit: Payer: Medicare Other | Admitting: Psychiatry

## 2015-10-11 VITALS — BP 132/84 | HR 76 | Temp 98.7°F | Resp 16 | Ht 65.0 in | Wt 128.6 lb

## 2015-10-11 DIAGNOSIS — F329 Major depressive disorder, single episode, unspecified: Secondary | ICD-10-CM | POA: Diagnosis not present

## 2015-10-11 DIAGNOSIS — K5901 Slow transit constipation: Secondary | ICD-10-CM | POA: Diagnosis not present

## 2015-10-11 DIAGNOSIS — F32A Depression, unspecified: Secondary | ICD-10-CM

## 2015-10-11 DIAGNOSIS — M19022 Primary osteoarthritis, left elbow: Secondary | ICD-10-CM | POA: Diagnosis not present

## 2015-10-11 MED ORDER — POLYETHYLENE GLYCOL 4500 POWD
17.0000 g | Freq: Every day | 2 refills | Status: DC
Start: 2015-10-11 — End: 2017-07-30

## 2015-10-11 MED ORDER — MELOXICAM 15 MG PO TABS: 15.0000 mg | ORAL_TABLET | Freq: Every day | ORAL | 0 refills | Status: DC

## 2015-10-11 MED ORDER — BISACODYL 10 MG RE SUPP: 10.0000 mg | RECTAL | 0 refills | Status: DC | PRN

## 2015-10-11 NOTE — Patient Instructions (Addendum)
Constipation: We will get an XR. Start taking miralax once daily- mix about 17g (1 tablespoon) into liquid daily. Take bisacodyl suppository once nightly for 1 week to help clean you out. Continue with miralax or do over the counter Benefiber or Metamucil.   Arthritis: We will start meloxicam to help with your joints- take once daily. No other advil or aleve. Can take extra-strength tylenol as needed.   Anxiety: I highly recommend finding a new psychiatrist. Please call  [facility]- [address], [place], [place] [address]  Hours:  Open today  9AM-5PM  Phone: [phone]  Called [facility]: [phone]- you can walk in as a new patient M-F 9am-4pm. I recommend that you walk in to discuss your issues.

## 2015-10-11 NOTE — Progress Notes (Signed)
Subjective:    Patient ID: Kayla Shah, female    DOB: 05/20/1955, 59 y.o.   MRN: [id number]  HPI: Kayla Shah is a 60 y.o. female presenting on 10/11/2015 for Hypertension   HPI  Pt presents for hypertension but she would like to talk about her psychiatrist today. She has not been back to see psychiatry since June. She only wants Xanax to treat her psychiatric issues. She does not agree with the plan the psychiatrist started. She  Pt is having issues with constipation. Bowel movements changed a couple months ago. BM's daily to now once per week. She is using laxatives at home. Straining to have BM. Unsure what changed. No blood in the stool. Tried Senna at home. Home enema. Is trying to eat well. Drinking water. Has tried increasing fiber.  Taking a stool softener. Type 2 bristol stool chart.  Pt is reporting arthritis pain- L elbow, L wrist, L fingers. Ices it regularly. Taking aleve at home for pain. Aleve does not help. Hurts when she wakes up in the morning. Exercise does help joints. No swelling in the joints in the morning. She was taking Xanax to help with pain previously.   Pt is refusing to see neurology due to cost.   Pt is refusing to return to psychiatry. She is refusing any SSRI. Refusing well butrin- reports she failed in the past. Has failed seroquel in the past.  Does report some passive SI. No plans to commit suicide. She does not feel she is a danger to herself. Psychiatry gave her instructions on how to wean off of Xanax. She has been out for 2 weeks.    Past Medical History:  Diagnosis Date  . Allergy   . Arthritis   . Depression   . Diabetes mellitus, type II ([place])   . GERD (gastroesophageal reflux disease)   . Headache   . Hyperlipidemia   . Hypertension   . Migraine   . Seizure [facility]     Current Outpatient Prescriptions on File Prior to Visit  Medication Sig  . levETIRAcetam (KEPPRA) 500 MG tablet Take 1 tablet (500 mg total) by mouth 2 (two) times  daily. (Patient taking differently: Take 500 mg by mouth daily. )  . pantoprazole (PROTONIX) 40 MG tablet Take 1 tablet (40 mg total) by mouth daily.  . simvastatin (ZOCOR) 20 MG tablet Take 1 tablet (20 mg total) by mouth daily at 6 PM.  . verapamil (CALAN-SR) 240 MG CR tablet Take 1 tablet (240 mg total) by mouth daily.   No current facility-administered medications on file prior to visit.     Review of Systems  Constitutional: Negative for chills and fever.  HENT: Negative.   Respiratory: Negative for cough, chest tightness and wheezing.   Cardiovascular: Negative for chest pain and leg swelling.  Gastrointestinal: Positive for constipation. Negative for abdominal pain, diarrhea, nausea and vomiting.  Endocrine: Negative.  Negative for cold intolerance, heat intolerance, polydipsia, polyphagia and polyuria.  Genitourinary: Negative for difficulty urinating and dysuria.  Musculoskeletal: Negative.   Neurological: Negative for dizziness, light-headedness and numbness.  Psychiatric/Behavioral: Positive for dysphoric mood and suicidal ideas. Negative for agitation, behavioral problems, hallucinations, self-injury and sleep disturbance. The patient is nervous/anxious.    Per HPI unless specifically indicated above     Objective:    BP 132/84 (BP Location: Left Arm, Patient Position: Sitting, Cuff Size: Normal)   Pulse 76   Temp 98.7 F (37.1 C) (Oral)   Resp 16  Ht 5\' 5"  (1.651 m)   Wt 128 lb 9.6 oz (58.3 kg)   BMI 21.40 kg/m   Wt Readings from Last 3 Encounters:  10/11/15 128 lb 9.6 oz (58.3 kg)  07/12/15 136 lb (61.7 kg)  07/06/15 134 lb 12.8 oz (61.1 kg)    Physical Exam  Constitutional: She is oriented to person, place, and time. She appears well-developed and well-nourished.  HENT:  Head: Normocephalic and atraumatic.  Neck: Neck supple.  Cardiovascular: Normal rate, regular rhythm and normal heart sounds.  Exam reveals no gallop and no friction rub.   No murmur  heard. Pulmonary/Chest: Effort normal and breath sounds normal. She has no wheezes. She exhibits no tenderness.  Abdominal: Soft. Normal appearance and bowel sounds are normal. She exhibits no distension and no mass. There is generalized tenderness. There is no rebound and no guarding.  Musculoskeletal: Normal range of motion. She exhibits no edema or tenderness.  Lymphadenopathy:    She has no cervical adenopathy.  Neurological: She is alert and oriented to person, place, and time.  Skin: Skin is warm and dry.  Psychiatric: Her speech is normal. Judgment and thought content normal. Her mood appears anxious. She is hyperactive. Cognition and memory are normal. She expresses no suicidal ideation. She expresses no suicidal plans.   Results for orders placed or performed in visit on 06/07/15  HM MAMMOGRAPHY  Result Value Ref Range   HM Mammogram Self Reported Normal 0-4 Bi-Rad, Self Reported Normal  Levetiracetam level  Result Value Ref Range   Levetiracetam Lvl 8.6 (L) 10.0 - 40.0 ug/mL  Comprehensive metabolic panel  Result Value Ref Range   Glucose 111 (H) 65 - 99 mg/dL   BUN 12 6 - 24 mg/dL   Creatinine, Ser 0.97 0.57 - 1.00 mg/dL   GFR calc non Af Amer 64 >59 mL/min/1.73   GFR calc Af Amer 74 >59 mL/min/1.73   BUN/Creatinine Ratio 12 9 - 23   Sodium 143 134 - 144 mmol/L   Potassium 4.2 3.5 - 5.2 mmol/L   Chloride 98 96 - 106 mmol/L   CO2 21 18 - 29 mmol/L   Calcium 9.5 8.7 - 10.2 mg/dL   Total Protein 7.6 6.0 - 8.5 g/dL   Albumin 4.7 3.5 - 5.5 g/dL   Globulin, Total 2.9 1.5 - 4.5 g/dL   Albumin/Globulin Ratio 1.6 1.2 - 2.2   Bilirubin Total 0.3 0.0 - 1.2 mg/dL   Alkaline Phosphatase 66 39 - 117 IU/L   AST 15 0 - 40 IU/L   ALT 8 0 - 32 IU/L  Lipid panel  Result Value Ref Range   Cholesterol, Total 189 100 - 199 mg/dL   Triglycerides 120 0 - 149 mg/dL   HDL 54 >39 mg/dL   VLDL Cholesterol Cal 24 5 - 40 mg/dL   LDL Calculated 111 (H) 0 - 99 mg/dL   Chol/HDL Ratio 3.5 0.0 -  4.4 ratio units  CBC with Differential/Platelet  Result Value Ref Range   WBC 6.3 3.4 - 10.8 x10E3/uL   RBC 4.12 3.77 - 5.28 x10E6/uL   Hemoglobin 13.6 11.1 - 15.9 g/dL   Hematocrit 39.4 34.0 - 46.6 %   MCV 96 79 - 97 fL   MCH 33.0 26.6 - 33.0 pg   MCHC 34.5 31.5 - 35.7 g/dL   RDW 14.0 12.3 - 15.4 %   Platelets 208 150 - 379 x10E3/uL   Neutrophils 57 %   Lymphs 33 %   Monocytes 6 %  Eos 3 %   Basos 1 %   Neutrophils Absolute 3.6 1.4 - 7.0 x10E3/uL   Lymphocytes Absolute 2.1 0.7 - 3.1 x10E3/uL   Monocytes Absolute 0.4 0.1 - 0.9 x10E3/uL   EOS (ABSOLUTE) 0.2 0.0 - 0.4 x10E3/uL   Basophils Absolute 0.0 0.0 - 0.2 x10E3/uL   Immature Granulocytes 0 %   Immature Grans (Abs) 0.0 0.0 - 0.1 x10E3/uL  VITAMIN D 25 Hydroxy (Vit-D Deficiency, Fractures)  Result Value Ref Range   Vit D, 25-Hydroxy 47.4 30.0 - 100.0 ng/mL  Hgb A1c w/o eAG  Result Value Ref Range   Hgb A1c MFr Bld 5.9 (H) 4.8 - 5.6 %  Specimen status report  Result Value Ref Range   specimen status report Comment       Assessment & Plan:   Problem List Items Addressed This Visit      Musculoskeletal and Integument   Primary osteoarthritis of left elbow    Start meloxicam to help with elbow pain.  PRN tylenol for breakthrough pain. Recommend icy hot cream, blue emu cream for pain. Continue usual activities to help with pain.       Relevant Medications   meloxicam (MOBIC) 15 MG tablet     Other   Clinical depression    Strongly advised this patient to continue with psychiatry. She has failed multiple psych medications including atypicals I don't feel she can be managed in primary care. She is requesting Xanax today- she weaned herself per psychiatry's recommendations and has been out for about 2 weeks. Declined to fill today since psychiatry worked to take her off and I don't feel restarting is in her best interest. Strongly advised psych with passive SI- she does not feel she is danger to herself-recommend she go to  ER if she feels she is a danger to herself.        Other Visit Diagnoses    Slow transit constipation    -  Primary   KUB to check for impaction. Restart miralax to help with constipation. PRN bisacoydl. Pt is due for colonoscopy- will refer to GI. Return 1 mos.    Relevant Medications   Polyethylene Glycol 4500 POWD   bisacodyl (DULCOLAX) 10 MG suppository   Other Relevant Orders   DG Abd 1 View      Meds ordered this encounter  Medications  . Polyethylene Glycol 4500 POWD    Sig: 17 g by Does not apply route daily.    Dispense:  500 g    Refill:  2    Order Specific Question:   Supervising Provider    Answer:   Arlis Porta [id number]  . bisacodyl (DULCOLAX) 10 MG suppository    Sig: Place 1 suppository (10 mg total) rectally as needed for moderate constipation.    Dispense:  12 suppository    Refill:  0    Order Specific Question:   Supervising Provider    Answer:   Arlis Porta [phone]  . meloxicam (MOBIC) 15 MG tablet    Sig: Take 1 tablet (15 mg total) by mouth daily.    Dispense:  30 tablet    Refill:  0    Order Specific Question:   Supervising Provider    Answer:   Arlis Porta [id number]      Follow up plan: Return in about 4 weeks (around 11/08/2015) for constipation. Marland Kitchen

## 2015-10-11 NOTE — Assessment & Plan Note (Signed)
Strongly advised this patient to continue with psychiatry. She has failed multiple psych medications including atypicals I don't feel she can be managed in primary care. She is requesting Xanax today- she weaned herself per psychiatry's recommendations and has been out for about 2 weeks. Declined to fill today since psychiatry worked to take her off and I don't feel restarting is in her best interest. Strongly advised psych with passive SI- she does not feel she is danger to herself-recommend she go to ER if she feels she is a danger to herself.

## 2015-10-11 NOTE — Assessment & Plan Note (Signed)
Start meloxicam to help with elbow pain.  PRN tylenol for breakthrough pain. Recommend icy hot cream, blue emu cream for pain. Continue usual activities to help with pain.

## 2015-10-15 ENCOUNTER — Ambulatory Visit: Payer: Self-pay | Admitting: Family Medicine

## 2015-10-15 ENCOUNTER — Other Ambulatory Visit: Payer: Self-pay | Admitting: *Deleted

## 2015-10-15 DIAGNOSIS — Z87891 Personal history of nicotine dependence: Secondary | ICD-10-CM

## 2015-10-16 ENCOUNTER — Telehealth: Payer: Self-pay | Admitting: Family Medicine

## 2015-10-16 ENCOUNTER — Ambulatory Visit: Payer: Medicare Other | Attending: Oncology

## 2015-10-16 ENCOUNTER — Encounter: Payer: Self-pay | Admitting: Oncology

## 2015-10-16 ENCOUNTER — Inpatient Hospital Stay: Payer: Medicare Other | Admitting: Oncology

## 2015-10-16 NOTE — Telephone Encounter (Signed)
Called to follow-up on patient abdominal XR. She never got it done.  She said she has a respiratory virus and will try to get it done next week.

## 2015-10-22 ENCOUNTER — Other Ambulatory Visit: Payer: Self-pay | Admitting: *Deleted

## 2015-10-22 DIAGNOSIS — Z87891 Personal history of nicotine dependence: Secondary | ICD-10-CM

## 2015-10-23 ENCOUNTER — Inpatient Hospital Stay: Payer: Medicare Other | Attending: Oncology | Admitting: Oncology

## 2015-10-23 ENCOUNTER — Ambulatory Visit
Admission: RE | Admit: 2015-10-23 | Discharge: 2015-10-23 | Disposition: A | Discharge status: Home / Self Care | Payer: Medicare Other | Source: Ambulatory Visit | Attending: Oncology | Admitting: Oncology

## 2015-10-23 DIAGNOSIS — F17210 Nicotine dependence, cigarettes, uncomplicated: Secondary | ICD-10-CM

## 2015-10-23 DIAGNOSIS — Z87891 Personal history of nicotine dependence: Secondary | ICD-10-CM | POA: Diagnosis not present

## 2015-10-23 DIAGNOSIS — R911 Solitary pulmonary nodule: Secondary | ICD-10-CM | POA: Insufficient documentation

## 2015-10-23 DIAGNOSIS — Z122 Encounter for screening for malignant neoplasm of respiratory organs: Secondary | ICD-10-CM | POA: Diagnosis not present

## 2015-10-23 NOTE — Progress Notes (Signed)
In accordance with CMS guidelines, patient has met eligibility criteria including age, absence of signs or symptoms of lung cancer.  Social History  Substance Use Topics  . Smoking status: Current Every Day Smoker    Packs/day: 1.00    Years: 45.00    Types: Cigarettes  . Smokeless tobacco: Current User  . Alcohol use No     A shared decision-making session was conducted prior to the performance of CT scan. This includes one or more decision aids, includes benefits and harms of screening, follow-up diagnostic testing, over-diagnosis, false positive rate, and total radiation exposure.  Counseling on the importance of adherence to annual lung cancer LDCT screening, impact of co-morbidities, and ability or willingness to undergo diagnosis and treatment is imperative for compliance of the program.  Counseling on the importance of continued smoking cessation for former smokers; the importance of smoking cessation for current smokers, and information about tobacco cessation interventions have been given to patient including [place] and 1800 quit [place] programs.  Written order for lung cancer screening with LDCT has been given to the patient and any and all questions have been answered to the best of my abilities.   Yearly follow up will be coordinated by Burgess Estelle, Thoracic Navigator.

## 2015-10-25 ENCOUNTER — Telehealth: Payer: Self-pay | Admitting: *Deleted

## 2015-10-25 NOTE — Telephone Encounter (Signed)
Notified patient of LDCT lung cancer screening results with recommendation for 12 month follow up imaging. Patient verbalizes understanding.   IMPRESSION: 3.3 mm subpleural left upper lobe pulmonary nodule. Lung-RADS Category 2, benign appearance or behavior. Continue annual screening with low-dose chest CT without contrast in 12 months.

## 2015-10-29 ENCOUNTER — Ambulatory Visit: Payer: Medicare Other

## 2015-10-31 ENCOUNTER — Ambulatory Visit: Payer: Medicare Other | Attending: Family Medicine

## 2015-11-09 ENCOUNTER — Ambulatory Visit: Payer: Self-pay | Admitting: Family Medicine

## 2015-11-21 ENCOUNTER — Ambulatory Visit: Payer: Self-pay | Admitting: Family Medicine

## 2015-11-26 ENCOUNTER — Ambulatory Visit: Payer: Medicare Other

## 2016-01-09 ENCOUNTER — Ambulatory Visit: Payer: Self-pay | Admitting: Internal Medicine

## 2016-01-09 ENCOUNTER — Other Ambulatory Visit: Payer: Self-pay | Admitting: Family Medicine

## 2016-01-09 DIAGNOSIS — Z79899 Other long term (current) drug therapy: Secondary | ICD-10-CM | POA: Diagnosis not present

## 2016-01-09 DIAGNOSIS — Z1231 Encounter for screening mammogram for malignant neoplasm of breast: Secondary | ICD-10-CM | POA: Diagnosis not present

## 2016-01-09 DIAGNOSIS — K219 Gastro-esophageal reflux disease without esophagitis: Secondary | ICD-10-CM | POA: Diagnosis not present

## 2016-02-27 ENCOUNTER — Encounter: Payer: Self-pay | Admitting: Family Medicine

## 2016-02-27 ENCOUNTER — Ambulatory Visit
Admission: RE | Admit: 2016-02-27 | Discharge: 2016-02-27 | Disposition: A | Discharge status: Home / Self Care | Payer: Medicare Other | Source: Ambulatory Visit | Attending: Family Medicine | Admitting: Family Medicine

## 2016-02-27 DIAGNOSIS — M818 Other osteoporosis without current pathological fracture: Secondary | ICD-10-CM | POA: Diagnosis not present

## 2016-02-27 DIAGNOSIS — Z13820 Encounter for screening for osteoporosis: Secondary | ICD-10-CM | POA: Diagnosis not present

## 2016-02-27 DIAGNOSIS — M810 Age-related osteoporosis without current pathological fracture: Secondary | ICD-10-CM | POA: Insufficient documentation

## 2016-03-13 DIAGNOSIS — R5381 Other malaise: Secondary | ICD-10-CM | POA: Diagnosis not present

## 2016-03-13 DIAGNOSIS — M545 Low back pain: Secondary | ICD-10-CM | POA: Diagnosis not present

## 2016-03-13 DIAGNOSIS — Z79899 Other long term (current) drug therapy: Secondary | ICD-10-CM | POA: Diagnosis not present

## 2016-03-13 DIAGNOSIS — R5383 Other fatigue: Secondary | ICD-10-CM | POA: Diagnosis not present

## 2016-03-13 DIAGNOSIS — M542 Cervicalgia: Secondary | ICD-10-CM | POA: Diagnosis not present

## 2016-03-13 DIAGNOSIS — E559 Vitamin D deficiency, unspecified: Secondary | ICD-10-CM | POA: Diagnosis not present

## 2016-03-13 DIAGNOSIS — E7800 Pure hypercholesterolemia, unspecified: Secondary | ICD-10-CM | POA: Diagnosis not present

## 2016-03-13 DIAGNOSIS — E785 Hyperlipidemia, unspecified: Secondary | ICD-10-CM | POA: Diagnosis not present

## 2016-03-13 DIAGNOSIS — R7989 Other specified abnormal findings of blood chemistry: Secondary | ICD-10-CM | POA: Diagnosis not present

## 2016-04-20 ENCOUNTER — Encounter: Payer: Self-pay | Admitting: Emergency Medicine

## 2016-04-20 ENCOUNTER — Emergency Department
Admission: EM | Admit: 2016-04-20 | Discharge: 2016-04-20 | Disposition: A | Discharge status: Home / Self Care | Payer: Medicare Other | Attending: Emergency Medicine | Admitting: Emergency Medicine

## 2016-04-20 DIAGNOSIS — E119 Type 2 diabetes mellitus without complications: Secondary | ICD-10-CM | POA: Diagnosis not present

## 2016-04-20 DIAGNOSIS — R112 Nausea with vomiting, unspecified: Secondary | ICD-10-CM | POA: Diagnosis present

## 2016-04-20 DIAGNOSIS — G43909 Migraine, unspecified, not intractable, without status migrainosus: Secondary | ICD-10-CM | POA: Diagnosis not present

## 2016-04-20 DIAGNOSIS — I10 Essential (primary) hypertension: Secondary | ICD-10-CM | POA: Diagnosis not present

## 2016-04-20 DIAGNOSIS — F17210 Nicotine dependence, cigarettes, uncomplicated: Secondary | ICD-10-CM | POA: Insufficient documentation

## 2016-04-20 MED ORDER — SODIUM CHLORIDE 0.9 % IV BOLUS (SEPSIS)
1000.0000 mL | Freq: Once | INTRAVENOUS | Status: AC
  Administered 2016-04-20: 1000 mL via INTRAVENOUS

## 2016-04-20 MED ORDER — FENTANYL CITRATE (PF) 100 MCG/2ML IJ SOLN: 50.0000 ug | Freq: Once | INTRAMUSCULAR | Status: DC

## 2016-04-20 MED ORDER — METOCLOPRAMIDE HCL 5 MG/ML IJ SOLN
10.0000 mg | Freq: Once | INTRAMUSCULAR | Status: AC
  Administered 2016-04-20: 10 mg via INTRAVENOUS
  Filled 2016-04-20: qty 2

## 2016-04-20 MED ORDER — BUTALBITAL-APAP-CAFFEINE 50-325-40 MG PO TABS: 1.0000 | ORAL_TABLET | Freq: Four times a day (QID) | ORAL | 0 refills | Status: AC | PRN

## 2016-04-20 MED ORDER — DIPHENHYDRAMINE HCL 50 MG/ML IJ SOLN
50.0000 mg | Freq: Once | INTRAMUSCULAR | Status: AC
  Administered 2016-04-20: 50 mg via INTRAVENOUS
  Filled 2016-04-20: qty 1

## 2016-04-20 MED ORDER — FENTANYL CITRATE (PF) 100 MCG/2ML IJ SOLN
100.0000 ug | Freq: Once | INTRAMUSCULAR | Status: AC
  Administered 2016-04-20: 100 ug via INTRAVENOUS
  Filled 2016-04-20: qty 2

## 2016-04-20 MED ORDER — KETOROLAC TROMETHAMINE 30 MG/ML IJ SOLN
30.0000 mg | Freq: Once | INTRAMUSCULAR | Status: AC
  Administered 2016-04-20: 30 mg via INTRAVENOUS
  Filled 2016-04-20: qty 1

## 2016-04-20 MED ORDER — METHYLPREDNISOLONE SODIUM SUCC 125 MG IJ SOLR
125.0000 mg | Freq: Once | INTRAMUSCULAR | Status: AC
  Administered 2016-04-20: 125 mg via INTRAVENOUS
  Filled 2016-04-20: qty 2

## 2016-04-20 NOTE — ED Notes (Signed)
Pt provided with additional warm blankets. Pt states "I want to go".

## 2016-04-20 NOTE — ED Provider Notes (Signed)
[facility] Emergency Department Provider Note  Time seen: 5:01 PM  I have reviewed the triage vital signs and the nursing notes.   HISTORY  Chief Complaint Headache and Emesis    HPI Kayla Shah is a 61 y.o. female with a past medical history of depression, diabetes, gastric reflux, headaches, migraines, hypertension, seizure disorder, presents to the emergency department for a migraine headache. According to the patient for the past 3 days she has had a headache. States it started very mild but has progressively worsened today she describes as moderate to severe along with photophobia and nausea and vomiting which are typical of her migraines. Patient states her last migraine was approximately one year ago. Denies any weakness or numbness. Denies sudden onset. Denies fever or neck pain.  Past Medical History:  Diagnosis Date  . Allergy   . Arthritis   . Depression   . Diabetes mellitus, type II ([place])   . GERD (gastroesophageal reflux disease)   . Headache   . Hyperlipidemia   . Hypertension   . Migraine   . Seizure [facility])     Patient Active Problem List   Diagnosis Date Noted  . Osteoporosis 02/27/2016  . Personal history of tobacco use, presenting hazards to health 10/23/2015  . Primary osteoarthritis of left elbow 10/11/2015  . Headache 06/07/2015  . Seizure ([place]) 06/07/2015  . Smoker 06/07/2015  . Gastroesophageal reflux disease without esophagitis 06/07/2015  . Chronic obstructive bronchitis ([place]) 08/14/2014  . Essential (primary) hypertension 08/14/2014  . Pure hypercholesterolemia 08/14/2014  . Abnormal brain scan 08/29/2013  . Clinical depression 08/29/2013    Past Surgical History:  Procedure Laterality Date  . ABDOMINAL HYSTERECTOMY  1984    Prior to Admission medications   Medication Sig Start Date End Date Taking? Authorizing Provider  bisacodyl (DULCOLAX) 10 MG suppository Place 1 suppository (10 mg total) rectally as  needed for moderate constipation. 10/11/15   [name] Overton Mam, NP  levETIRAcetam (KEPPRA) 500 MG tablet Take 1 tablet (500 mg total) by mouth 2 (two) times daily. Patient taking differently: Take 500 mg by mouth daily.  06/07/15   [name] Overton Mam, NP  meloxicam (MOBIC) 15 MG tablet Take 1 tablet (15 mg total) by mouth daily. 10/11/15   [name] Lauren Krebs, NP  pantoprazole (PROTONIX) 40 MG tablet Take 1 tablet (40 mg total) by mouth daily. 06/07/15   [name] Overton Mam, NP  Polyethylene Glycol 4500 POWD 17 g by Does not apply route daily. 10/11/15   [name] Overton Mam, NP  simvastatin (ZOCOR) 20 MG tablet Take 1 tablet (20 mg total) by mouth daily at 6 PM. 06/07/15   [name] Overton Mam, NP  verapamil (CALAN-SR) 240 MG CR tablet Take 1 tablet (240 mg total) by mouth daily. 06/07/15 06/06/16  [name] Overton Mam, NP    Allergies  Allergen Reactions  . Paroxetine Hcl Other (See Comments)    Pt. Becomes very aggressive with mood swings.  . Codeine Nausea And Vomiting  . Fluoxetine Anxiety  . Paroxetine Anxiety    Family History  Problem Relation Age of Onset  . Stroke Mother   . Depression Mother   . Heart disease Father   . Alcohol abuse Father   . Osteoporosis Sister   . Kidney disease Sister     Social History Social History  Substance Use Topics  . Smoking status: Current Every Day Smoker    Packs/day: 1.00    Years: 45.00    Types: Cigarettes  .  Smokeless tobacco: Current User  . Alcohol use No    Review of Systems Constitutional: Negative for fever. Cardiovascular: Negative for chest pain. Respiratory: Negative for shortness of breath. Gastrointestinal: Negative for abdominal pain. Positive for nausea and vomiting. Negative diarrhea Musculoskeletal: Upper neck pain Neurological: Moderate headache, negative for focal weakness or numbness. 10-point ROS otherwise negative.  ____________________________________________   PHYSICAL EXAM:  VITAL SIGNS: ED Triage Vitals  Enc Vitals Group      BP 04/20/16 1604 112/73     Pulse Rate 04/20/16 1604 78     Resp 04/20/16 1604 20     Temp 04/20/16 1604 98.9 F (37.2 C)     Temp Source 04/20/16 1604 Oral     SpO2 04/20/16 1604 99 %     Weight 04/20/16 1605 132 lb (59.9 kg)     Height 04/20/16 1605 5\' 5"  (1.651 m)     Head Circumference --      Peak Flow --      Pain Score 04/20/16 1605 10     Pain Loc --      Pain Edu? --      Excl. in [place]? --     Constitutional: Alert and oriented. Keep lights off in the room states that is painful to look at the light. Eyes: Normal exam, mild photophobia on exam. PERRL, EOMI. ENT   Head: Normocephalic and atraumatic   Mouth/Throat: Mucous membranes are moist. Cardiovascular: Normal rate, regular rhythm. No murmur Respiratory: Normal respiratory effort without tachypnea nor retractions. Breath sounds are clear  Gastrointestinal: Soft and nontender. No distention.   Musculoskeletal: Nontender with normal range of motion in all extremities.  Neurologic:  Normal speech and language. No gross focal neurologic deficits are equal grip strengths. Skin:  Skin is warm, dry and intact.  Psychiatric: Mood and affect are normal.   ____________________________________________   INITIAL IMPRESSION / ASSESSMENT AND PLAN / ED COURSE  Pertinent labs & imaging results that were available during my care of the patient were reviewed by me and considered in my medical decision making (see chart for details).  Patient presents the emergency department with a headache, symptoms are very suggestive of migraine, patient is a history of migraine with similar symptoms in the past but states it is been approximately one year since she has had a migraine. We will treat with Toradol, Reglan, Benadryl and closely monitor for improvement in the emergency department.  Patient states significant improvement in headache, still has a mild headache. Patient is asking to be discharged home so she can go home and  sleep.  ____________________________________________   FINAL CLINICAL IMPRESSION(S) / ED DIAGNOSES  Migraine headache    Harvest Dark, MD 04/20/16 1941

## 2016-04-20 NOTE — ED Triage Notes (Signed)
Pt presents to ER from home with complaints of headache for 2 days reports history of migraine taking OTC med Ibuprofen with no relief. Pt reports unable to see PCP until May. Pt reports has been having nausea and vomiting, last episode of emesis about 30 minutes prior to ER arrival. Pt talks in complete sentences no respiratory distress noted, pt reports photosensitivity.

## 2016-10-09 ENCOUNTER — Ambulatory Visit: Payer: Self-pay | Admitting: Nurse Practitioner

## 2016-10-13 ENCOUNTER — Telehealth: Payer: Self-pay | Admitting: *Deleted

## 2016-10-13 DIAGNOSIS — Z122 Encounter for screening for malignant neoplasm of respiratory organs: Secondary | ICD-10-CM

## 2016-10-13 DIAGNOSIS — Z87891 Personal history of nicotine dependence: Secondary | ICD-10-CM

## 2016-10-13 NOTE — Telephone Encounter (Signed)
CT appt schd and  conf with patient for 09/19 @ 2:30 pm.

## 2016-10-13 NOTE — Telephone Encounter (Signed)
Notified patient that annual lung cancer screening low dose CT scan is due currently or will be in near future. Confirmed that patient is within the age range of 55-77, and asymptomatic, (no signs or symptoms of lung cancer). Patient denies illness that would prevent curative treatment for lung cancer if found. Verified smoking history, (current, 46 pack year). The shared decision making visit was done 10/23/15. Patient is agreeable for CT scan being scheduled.

## 2016-10-22 ENCOUNTER — Ambulatory Visit
Admission: RE | Admit: 2016-10-22 | Discharge: 2016-10-22 | Disposition: A | Discharge status: Home / Self Care | Payer: Medicare Other | Source: Ambulatory Visit | Attending: Oncology | Admitting: Oncology

## 2016-10-22 DIAGNOSIS — J439 Emphysema, unspecified: Secondary | ICD-10-CM | POA: Diagnosis not present

## 2016-10-22 DIAGNOSIS — Z122 Encounter for screening for malignant neoplasm of respiratory organs: Secondary | ICD-10-CM | POA: Insufficient documentation

## 2016-10-22 DIAGNOSIS — Z87891 Personal history of nicotine dependence: Secondary | ICD-10-CM | POA: Diagnosis not present

## 2016-10-22 DIAGNOSIS — I700 Atherosclerosis of aorta: Secondary | ICD-10-CM | POA: Diagnosis not present

## 2016-10-22 DIAGNOSIS — I2510 Atherosclerotic heart disease of native coronary artery without angina pectoris: Secondary | ICD-10-CM | POA: Diagnosis not present

## 2016-10-27 ENCOUNTER — Encounter: Payer: Self-pay | Admitting: *Deleted

## 2016-12-01 ENCOUNTER — Other Ambulatory Visit: Payer: Self-pay | Admitting: Internal Medicine

## 2016-12-01 DIAGNOSIS — Z1231 Encounter for screening mammogram for malignant neoplasm of breast: Secondary | ICD-10-CM

## 2017-01-14 ENCOUNTER — Ambulatory Visit: Payer: Medicare Other | Admitting: Family Medicine

## 2017-03-05 ENCOUNTER — Telehealth: Payer: Self-pay | Admitting: Gastroenterology

## 2017-03-05 NOTE — Telephone Encounter (Signed)
Patient ready to schedule procedure 

## 2017-03-09 ENCOUNTER — Other Ambulatory Visit: Payer: Self-pay

## 2017-03-09 DIAGNOSIS — Z1211 Encounter for screening for malignant neoplasm of colon: Secondary | ICD-10-CM

## 2017-03-09 NOTE — Telephone Encounter (Signed)
Gastroenterology Pre-Procedure Review  Request Date: 03/26/17 Requesting Physician: Dr. Vicente Males  PATIENT REVIEW QUESTIONS: The patient responded to the following health history questions as indicated:    1. Are you having any GI issues? no 2. Do you have a personal history of Polyps? no 3. Do you have a family history of Colon Cancer or Polyps? no 4. Diabetes Mellitus? no 5. Joint replacements in the past 12 months?no 6. Major health problems in the past 3 months?no 7. Any artificial heart valves, MVP, or defibrillator?no    MEDICATIONS & ALLERGIES:    Patient reports the following regarding taking any anticoagulation/antiplatelet therapy:   Plavix, Coumadin, Eliquis, Xarelto, Lovenox, Pradaxa, Brilinta, or Effient? no Aspirin? yes (81 mg daily)  Patient confirms/reports the following medications:  Current Outpatient Medications  Medication Sig Dispense Refill  . bisacodyl (DULCOLAX) 10 MG suppository Place 1 suppository (10 mg total) rectally as needed for moderate constipation. 12 suppository 0  . butalbital-acetaminophen-caffeine (FIORICET, ESGIC) 50-325-40 MG tablet Take 1-2 tablets by mouth every 6 (six) hours as needed for headache. 20 tablet 0  . levETIRAcetam (KEPPRA) 500 MG tablet Take 1 tablet (500 mg total) by mouth 2 (two) times daily. (Patient taking differently: Take 500 mg by mouth daily. ) 180 tablet 1  . meloxicam (MOBIC) 15 MG tablet Take 1 tablet (15 mg total) by mouth daily. 30 tablet 0  . pantoprazole (PROTONIX) 40 MG tablet Take 1 tablet (40 mg total) by mouth daily. 90 tablet 3  . Polyethylene Glycol 4500 POWD 17 g by Does not apply route daily. 500 g 2  . simvastatin (ZOCOR) 20 MG tablet Take 1 tablet (20 mg total) by mouth daily at 6 PM. 90 tablet 3  . verapamil (CALAN-SR) 240 MG CR tablet Take 1 tablet (240 mg total) by mouth daily. 90 tablet 3   No current facility-administered medications for this visit.     Patient confirms/reports the following allergies:   Allergies  Allergen Reactions  . Paroxetine Hcl Other (See Comments)    Pt. Becomes very aggressive with mood swings.  . Codeine Nausea And Vomiting  . Fluoxetine Anxiety  . Paroxetine Anxiety    No orders of the defined types were placed in this encounter.   AUTHORIZATION INFORMATION Primary Insurance: 1D#: Group #:  Secondary Insurance: 1D#: Group #:  SCHEDULE INFORMATION: Date:03/26/17  Time: Location:[facility]

## 2017-03-11 ENCOUNTER — Ambulatory Visit
Admission: RE | Admit: 2017-03-11 | Discharge: 2017-03-11 | Disposition: A | Discharge status: Home / Self Care | Payer: Medicare Other | Source: Ambulatory Visit | Attending: Internal Medicine | Admitting: Internal Medicine

## 2017-03-11 DIAGNOSIS — Z1231 Encounter for screening mammogram for malignant neoplasm of breast: Secondary | ICD-10-CM | POA: Diagnosis present

## 2017-03-24 ENCOUNTER — Telehealth: Payer: Self-pay | Admitting: Gastroenterology

## 2017-03-24 NOTE — Telephone Encounter (Signed)
Patient called & l/m wanting to talking with someone before her colonoscopy scheduled for 03-26-17 with Dr Vicente Males.

## 2017-03-26 ENCOUNTER — Encounter
Admission: RE | Disposition: A | Discharge status: Home / Self Care | Payer: Self-pay | Source: Ambulatory Visit | Attending: Gastroenterology

## 2017-03-26 ENCOUNTER — Other Ambulatory Visit: Payer: Self-pay

## 2017-03-26 ENCOUNTER — Ambulatory Visit: Payer: Medicare Other | Admitting: Anesthesiology

## 2017-03-26 ENCOUNTER — Ambulatory Visit
Admission: RE | Admit: 2017-03-26 | Discharge: 2017-03-26 | Disposition: A | Discharge status: Home / Self Care | Payer: Medicare Other | Source: Ambulatory Visit | Attending: Gastroenterology | Admitting: Gastroenterology

## 2017-03-26 ENCOUNTER — Encounter: Payer: Self-pay | Admitting: *Deleted

## 2017-03-26 DIAGNOSIS — F329 Major depressive disorder, single episode, unspecified: Secondary | ICD-10-CM | POA: Diagnosis not present

## 2017-03-26 DIAGNOSIS — D124 Benign neoplasm of descending colon: Secondary | ICD-10-CM | POA: Diagnosis not present

## 2017-03-26 DIAGNOSIS — I10 Essential (primary) hypertension: Secondary | ICD-10-CM | POA: Insufficient documentation

## 2017-03-26 DIAGNOSIS — Z8249 Family history of ischemic heart disease and other diseases of the circulatory system: Secondary | ICD-10-CM | POA: Diagnosis not present

## 2017-03-26 DIAGNOSIS — Z888 Allergy status to other drugs, medicaments and biological substances status: Secondary | ICD-10-CM | POA: Diagnosis not present

## 2017-03-26 DIAGNOSIS — Z885 Allergy status to narcotic agent status: Secondary | ICD-10-CM | POA: Insufficient documentation

## 2017-03-26 DIAGNOSIS — M1990 Unspecified osteoarthritis, unspecified site: Secondary | ICD-10-CM | POA: Insufficient documentation

## 2017-03-26 DIAGNOSIS — Z79899 Other long term (current) drug therapy: Secondary | ICD-10-CM | POA: Diagnosis not present

## 2017-03-26 DIAGNOSIS — K219 Gastro-esophageal reflux disease without esophagitis: Secondary | ICD-10-CM | POA: Diagnosis not present

## 2017-03-26 DIAGNOSIS — E785 Hyperlipidemia, unspecified: Secondary | ICD-10-CM | POA: Insufficient documentation

## 2017-03-26 DIAGNOSIS — Z1211 Encounter for screening for malignant neoplasm of colon: Secondary | ICD-10-CM | POA: Diagnosis present

## 2017-03-26 DIAGNOSIS — J449 Chronic obstructive pulmonary disease, unspecified: Secondary | ICD-10-CM | POA: Insufficient documentation

## 2017-03-26 DIAGNOSIS — K621 Rectal polyp: Secondary | ICD-10-CM | POA: Diagnosis not present

## 2017-03-26 DIAGNOSIS — F17210 Nicotine dependence, cigarettes, uncomplicated: Secondary | ICD-10-CM | POA: Diagnosis not present

## 2017-03-26 HISTORY — PX: COLONOSCOPY WITH PROPOFOL: SHX5780

## 2017-03-26 SURGERY — COLONOSCOPY WITH PROPOFOL
Anesthesia: General

## 2017-03-26 MED ORDER — SODIUM CHLORIDE 0.9 % IV SOLN
INTRAVENOUS | Status: DC
  Administered 2017-03-26: 07:00:00 via INTRAVENOUS

## 2017-03-26 MED ORDER — PROPOFOL 10 MG/ML IV BOLUS
INTRAVENOUS | Status: DC | PRN
  Administered 2017-03-26: 14 mg via INTRAVENOUS
  Administered 2017-03-26: 80 mg via INTRAVENOUS
  Administered 2017-03-26: 14 mg via INTRAVENOUS
  Administered 2017-03-26: 28 mg via INTRAVENOUS
  Administered 2017-03-26: 14 mg via INTRAVENOUS

## 2017-03-26 MED ORDER — PROPOFOL 500 MG/50ML IV EMUL
INTRAVENOUS | Status: DC | PRN
  Administered 2017-03-26: 140 ug/kg/min via INTRAVENOUS

## 2017-03-26 MED ORDER — LIDOCAINE HCL (PF) 2 % IJ SOLN
INTRAMUSCULAR | Status: AC
  Filled 2017-03-26: qty 10

## 2017-03-26 MED ORDER — LIDOCAINE HCL (CARDIAC) 20 MG/ML IV SOLN
INTRAVENOUS | Status: DC | PRN
  Administered 2017-03-26: 50 mg via INTRAVENOUS

## 2017-03-26 MED ORDER — PROPOFOL 500 MG/50ML IV EMUL
INTRAVENOUS | Status: AC
  Filled 2017-03-26: qty 50

## 2017-03-26 NOTE — H&P (Signed)
Jonathon Bellows, MD [address], [place], [place], [place], [address] [address], [place], [place], [place], [address] Phone: [phone]  Fax: [phone]  Primary Care Physician:  Cletis Athens, MD   Pre-Procedure History & Physical: HPI:  Kayla Shah is a 62 y.o. female is here for an colonoscopy.   Past Medical History:  Diagnosis Date  . Allergy   . Arthritis   . Depression   . GERD (gastroesophageal reflux disease)   . Headache   . Hyperlipidemia   . Hypertension   . Migraine   . Seizure [facility])     Past Surgical History:  Procedure Laterality Date  . ABDOMINAL HYSTERECTOMY  1984  . WISDOM TOOTH EXTRACTION      Prior to Admission medications   Medication Sig Start Date End Date Taking? Authorizing Provider  bisacodyl (DULCOLAX) 10 MG suppository Place 1 suppository (10 mg total) rectally as needed for moderate constipation. 10/11/15  Yes Krebs, Genevie Cheshire, NP  butalbital-acetaminophen-caffeine (FIORICET, ESGIC) [phone] MG tablet Take 1-2 tablets by mouth every 6 (six) hours as needed for headache. 04/20/16 04/20/17 Yes Harvest Dark, MD  pantoprazole (PROTONIX) 40 MG tablet Take 1 tablet (40 mg total) by mouth daily. 06/07/15  Yes Krebs, [name] Lauren, NP  simvastatin (ZOCOR) 20 MG tablet Take 1 tablet (20 mg total) by mouth daily at 6 PM. 06/07/15  Yes Krebs, [name] Lauren, NP  verapamil (CALAN-SR) 240 MG CR tablet Take 1 tablet (240 mg total) by mouth daily. 06/07/15 03/26/17 Yes Krebs, [name] Lauren, NP  levETIRAcetam (KEPPRA) 500 MG tablet Take 1 tablet (500 mg total) by mouth 2 (two) times daily. Patient not taking: Reported on 03/26/2017 06/07/15   Luciana Axe, NP  meloxicam (MOBIC) 15 MG tablet Take 1 tablet (15 mg total) by mouth daily. Patient not taking: Reported on 03/26/2017 10/11/15   Luciana Axe, NP  Polyethylene Glycol 4500 POWD 17 g by Does not apply route daily. Patient not taking: Reported on 03/26/2017 10/11/15   Luciana Axe, NP     Allergies as of 03/10/2017 - Review Complete 04/20/2016  Allergen Reaction Noted  . Paroxetine hcl Other (See Comments) 06/07/2015  . Codeine Nausea And Vomiting 06/07/2015  . Fluoxetine Anxiety 06/07/2015  . Paroxetine Anxiety 06/07/2015    Family History  Problem Relation Age of Onset  . Stroke Mother   . Depression Mother   . Heart disease Father   . Alcohol abuse Father   . Osteoporosis Sister   . Kidney disease Sister   . Breast cancer Neg Hx     Social History   Socioeconomic History  . Marital status: Divorced    Spouse name: Not on file  . Number of children: Not on file  . Years of education: Not on file  . Highest education level: Not on file  Social Needs  . Financial resource strain: Not on file  . Food insecurity - worry: Not on file  . Food insecurity - inability: Not on file  . Transportation needs - medical: Not on file  . Transportation needs - non-medical: Not on file  Occupational History  . Not on file  Tobacco Use  . Smoking status: Current Every Day Smoker    Packs/day: 1.00    Years: 45.00    Pack years: 45.00    Types: Cigarettes  . Smokeless tobacco: Never Used  Substance and Sexual Activity  . Alcohol use: No  . Drug use: No  .  Sexual activity: No  Other Topics Concern  . Not on file  Social History Narrative  . Not on file    Review of Systems: See HPI, otherwise negative ROS  Physical Exam: BP (!) 158/85   Pulse 67   Temp (!) 97.4 F (36.3 C) (Tympanic)   Resp 20   Ht 5' 6.5" (1.689 m)   Wt 125 lb (56.7 kg)   SpO2 100%   BMI 19.87 kg/m  General:   Alert,  pleasant and cooperative in NAD Head:  Normocephalic and atraumatic. Neck:  Supple; no masses or thyromegaly. Lungs:  Clear throughout to auscultation, normal respiratory effort.    Heart:  +S1, +S2, Regular rate and rhythm, No edema. Abdomen:  Soft, nontender and nondistended. Normal bowel sounds, without guarding, and without rebound.   Neurologic:  Alert and   oriented x4;  grossly normal neurologically.  Impression/Plan: Kayla Shah is here for an colonoscopy to be performed for Screening colonoscopy average risk   Risks, benefits, limitations, and alternatives regarding  colonoscopy have been reviewed with the patient.  Questions have been answered.  All parties agreeable.   Jonathon Bellows, MD  03/26/2017, 7:44 AM

## 2017-03-26 NOTE — Op Note (Signed)
[facility] Gastroenterology Patient Name: Kayla Shah Procedure Date: 03/26/2017 7:45 AM MRN: [id number] Account #: [id number] Date of Birth: 03/06/1955 Admit Type: Outpatient Age: 62 Room: [facility] ENDO ROOM 3 Gender: Female Note Status: Finalized Procedure:            Colonoscopy Indications:          Screening for colorectal malignant neoplasm Providers:            Jonathon Bellows MD, MD Referring MD:         Cletis Athens, MD (Referring MD) Medicines:            Monitored Anesthesia Care Complications:        No immediate complications. Procedure:            Pre-Anesthesia Assessment:                       - Prior to the procedure, a History and Physical was                        performed, and patient medications, allergies and                        sensitivities were reviewed. The patient's tolerance of                        previous anesthesia was reviewed.                       - The risks and benefits of the procedure and the                        sedation options and risks were discussed with the                        patient. All questions were answered and informed                        consent was obtained.                       - ASA Grade Assessment: III - A patient with severe                        systemic disease.                       After obtaining informed consent, the colonoscope was                        passed under direct vision. Throughout the procedure,                        the patient's blood pressure, pulse, and oxygen                        saturations were monitored continuously. The                        Colonoscope was introduced through the anus and  advanced to the the cecum, identified by the                        appendiceal orifice, IC valve and transillumination.                        The colonoscopy was performed with ease. The patient                        tolerated the procedure well. The  quality of the bowel                        preparation was good. Findings:      The perianal and digital rectal examinations were normal.      Two sessile polyps were found in the rectum and descending colon. The       polyps were 5 to 7 mm in size. These polyps were removed with a cold       snare. Resection and retrieval were complete.      No additional abnormalities were found on retroflexion.      The exam was otherwise without abnormality on direct and retroflexion       views. Impression:           - Two 5 to 7 mm polyps in the rectum and in the                        descending colon, removed with a cold snare. Resected                        and retrieved.                       - The examination was otherwise normal on direct and                        retroflexion views. Recommendation:       - Discharge patient to home (with escort).                       - Resume previous diet.                       - Continue present medications.                       - Await pathology results.                       - Repeat colonoscopy in 5-10 years for surveillance                        based on pathology results. Procedure Code(s):    --- Professional ---                       [phone], Colonoscopy, flexible; with removal of tumor(s),                        polyp(s), or other lesion(s) by snare technique Diagnosis Code(s):    --- Professional ---  Z12.11, Encounter for screening for malignant neoplasm                        of colon                       K62.1, Rectal polyp                       D12.4, Benign neoplasm of descending colon CPT copyright 2016 American Medical Association. All rights reserved. The codes documented in this report are preliminary and upon coder review may  be revised to meet current compliance requirements. Jonathon Bellows, MD Jonathon Bellows MD, MD 03/26/2017 8:14:34 AM This report has been signed electronically. Number of Addenda: 0 Note  Initiated On: 03/26/2017 7:45 AM Scope Withdrawal Time: 0 hours 16 minutes 20 seconds  Total Procedure Duration: 0 hours 23 minutes 8 seconds       [facility]

## 2017-03-26 NOTE — Anesthesia Preprocedure Evaluation (Signed)
Anesthesia Evaluation  Patient identified by MRN, date of birth, ID band Patient awake    Reviewed: Allergy & Precautions, NPO status , Patient's Chart, lab work & pertinent test results  History of Anesthesia Complications (+) PONV  Airway Mallampati: II       Dental   Pulmonary neg sleep apnea, COPD (no tx at this time), Current Smoker,           Cardiovascular hypertension, Pt. on medications (-) Past MI and (-) CHF (-) dysrhythmias (-) Valvular Problems/Murmurs     Neuro/Psych Seizures - (off meds x 5 yrs, no seizures at least that much),  Depression    GI/Hepatic GERD  Medicated and Controlled,  Endo/Other  Diabetes: pt denies.  Renal/GU      Musculoskeletal   Abdominal   Peds  Hematology   Anesthesia Other Findings   Reproductive/Obstetrics                             Anesthesia Physical Anesthesia Plan  ASA: III  Anesthesia Plan: General   Post-op Pain Management:    Induction: Intravenous  PONV Risk Score and Plan: 3 and Propofol infusion, TIVA and Midazolam  Airway Management Planned: Nasal Cannula  Additional Equipment:   Intra-op Plan:   Post-operative Plan:   Informed Consent: I have reviewed the patients History and Physical, chart, labs and discussed the procedure including the risks, benefits and alternatives for the proposed anesthesia with the patient or authorized representative who has indicated his/her understanding and acceptance.     Plan Discussed with:   Anesthesia Plan Comments:         Anesthesia Quick Evaluation

## 2017-03-26 NOTE — Anesthesia Postprocedure Evaluation (Signed)
Anesthesia Post Note  Patient: Kayla Shah  Procedure(s) Performed: COLONOSCOPY WITH PROPOFOL (N/A )  Patient location during evaluation: Endoscopy Anesthesia Type: General Level of consciousness: awake and alert Pain management: pain level controlled Vital Signs Assessment: post-procedure vital signs reviewed and stable Respiratory status: spontaneous breathing and respiratory function stable Cardiovascular status: stable Anesthetic complications: no     Last Vitals:  Vitals:   03/26/17 0707 03/26/17 0818  BP: (!) 158/85 114/61  Pulse: 67 76  Resp: 20 14  Temp: (!) 36.3 C (!) 36.2 C  SpO2: 100% 100%    Last Pain:  Vitals:   03/26/17 0707  TempSrc: Tympanic                 Halona Amstutz K

## 2017-03-26 NOTE — Transfer of Care (Signed)
Immediate Anesthesia Transfer of Care Note  Patient: Kayla Shah  Procedure(s) Performed: COLONOSCOPY WITH PROPOFOL (N/A )  Patient Location: PACU and Endoscopy Unit  Anesthesia Type:General  Level of Consciousness: awake, alert , oriented and patient cooperative  Airway & Oxygen Therapy: Patient Spontanous Breathing and Patient connected to nasal cannula oxygen  Post-op Assessment: Report given to RN and Post -op Vital signs reviewed and stable  Post vital signs: Reviewed and stable  Last Vitals:  Vitals:   03/26/17 0707  BP: (!) 158/85  Pulse: 67  Resp: 20  Temp: (!) 36.3 C  SpO2: 100%    Last Pain:  Vitals:   03/26/17 0707  TempSrc: Tympanic      Patients Stated Pain Goal: 6 (88/41/66 0630)  Complications: No apparent anesthesia complications

## 2017-03-26 NOTE — Anesthesia Post-op Follow-up Note (Signed)
Anesthesia QCDR form completed.        

## 2017-03-27 ENCOUNTER — Encounter: Payer: Self-pay | Admitting: Gastroenterology

## 2017-03-27 LAB — SURGICAL PATHOLOGY

## 2017-04-06 ENCOUNTER — Telehealth: Payer: Self-pay | Admitting: Gastroenterology

## 2017-04-06 NOTE — Telephone Encounter (Signed)
Please call patient ASAP regarding results. She didn't get her letter and I explained the mail is slow. Please call today.

## 2017-04-06 NOTE — Telephone Encounter (Signed)
Advised patient to pick-up [facility] medication [facility] by 04/20/17.

## 2017-04-06 NOTE — Telephone Encounter (Signed)
Patient has been having awful headaches since her colonoscopy.

## 2017-05-10 ENCOUNTER — Emergency Department
Admission: EM | Admit: 2017-05-10 | Discharge: 2017-05-10 | Disposition: A | Discharge status: Home / Self Care | Payer: Medicare Other | Attending: Emergency Medicine | Admitting: Emergency Medicine

## 2017-05-10 ENCOUNTER — Other Ambulatory Visit: Payer: Self-pay

## 2017-05-10 ENCOUNTER — Encounter: Payer: Self-pay | Admitting: Emergency Medicine

## 2017-05-10 DIAGNOSIS — I10 Essential (primary) hypertension: Secondary | ICD-10-CM | POA: Diagnosis not present

## 2017-05-10 DIAGNOSIS — G43909 Migraine, unspecified, not intractable, without status migrainosus: Secondary | ICD-10-CM | POA: Diagnosis not present

## 2017-05-10 DIAGNOSIS — Z79899 Other long term (current) drug therapy: Secondary | ICD-10-CM | POA: Insufficient documentation

## 2017-05-10 DIAGNOSIS — R51 Headache: Secondary | ICD-10-CM | POA: Diagnosis present

## 2017-05-10 DIAGNOSIS — F17210 Nicotine dependence, cigarettes, uncomplicated: Secondary | ICD-10-CM | POA: Insufficient documentation

## 2017-05-10 MED ORDER — PROCHLORPERAZINE EDISYLATE 5 MG/ML IJ SOLN
10.0000 mg | Freq: Once | INTRAMUSCULAR | Status: AC
  Administered 2017-05-10: 10 mg via INTRAVENOUS
  Filled 2017-05-10: qty 2

## 2017-05-10 MED ORDER — SODIUM CHLORIDE 0.9 % IV BOLUS
1000.0000 mL | Freq: Once | INTRAVENOUS | Status: AC
  Administered 2017-05-10: 1000 mL via INTRAVENOUS

## 2017-05-10 MED ORDER — MAGNESIUM SULFATE 2 GM/50ML IV SOLN: 2.0000 g | Freq: Once | INTRAVENOUS | Status: DC

## 2017-05-10 MED ORDER — KETOROLAC TROMETHAMINE 30 MG/ML IJ SOLN: 30.0000 mg | Freq: Once | INTRAMUSCULAR | Status: DC

## 2017-05-10 NOTE — ED Notes (Signed)
ED Provider at bedside. 

## 2017-05-10 NOTE — ED Provider Notes (Signed)
[facility] Emergency Department Provider Note   ____________________________________________   I have reviewed the triage vital signs and the nursing notes.   HISTORY  Chief Complaint Migraine   History limited by: Not Limited   HPI Kayla Shah is a 62 y.o. female who presents to the emergency department today because of concerns for migraine.  It started 6 days ago.  Initially located on the right side however now is generalized throughout her head.  It is severe.  She tried using her home Imitrex without any relief.  She has had associated nausea and vomiting.  She has had photophobia.  States it is been a couple of years since she last had a migraine this bad.  Denies any recent trauma to her head.  Denies any fevers.  Per medical record review patient has a history of migraine.  Past Medical History:  Diagnosis Date  . Allergy   . Arthritis   . Depression   . GERD (gastroesophageal reflux disease)   . Headache   . Hyperlipidemia   . Hypertension   . Migraine   . Seizure [facility])     Patient Active Problem List   Diagnosis Date Noted  . Osteoporosis 02/27/2016  . Personal history of tobacco use, presenting hazards to health 10/23/2015  . Primary osteoarthritis of left elbow 10/11/2015  . Headache 06/07/2015  . Seizure ([place]) 06/07/2015  . Smoker 06/07/2015  . Gastroesophageal reflux disease without esophagitis 06/07/2015  . Chronic obstructive bronchitis ([place]) 08/14/2014  . Essential (primary) hypertension 08/14/2014  . Pure hypercholesterolemia 08/14/2014  . Abnormal brain scan 08/29/2013  . Clinical depression 08/29/2013    Past Surgical History:  Procedure Laterality Date  . ABDOMINAL HYSTERECTOMY  1984  . COLONOSCOPY WITH PROPOFOL N/A 03/26/2017   Procedure: COLONOSCOPY WITH PROPOFOL;  Surgeon: Jonathon Bellows, MD;  Location: [facility] ENDOSCOPY;  Service: Gastroenterology;  Laterality: N/A;  . WISDOM TOOTH EXTRACTION      Prior to  Admission medications   Medication Sig Start Date End Date Taking? Authorizing Provider  bisacodyl (DULCOLAX) 10 MG suppository Place 1 suppository (10 mg total) rectally as needed for moderate constipation. 10/11/15   Krebs, Genevie Cheshire, NP  levETIRAcetam (KEPPRA) 500 MG tablet Take 1 tablet (500 mg total) by mouth 2 (two) times daily. Patient not taking: Reported on 03/26/2017 06/07/15   Luciana Axe, NP  meloxicam (MOBIC) 15 MG tablet Take 1 tablet (15 mg total) by mouth daily. Patient not taking: Reported on 03/26/2017 10/11/15   Luciana Axe, NP  pantoprazole (PROTONIX) 40 MG tablet Take 1 tablet (40 mg total) by mouth daily. 06/07/15   Luciana Axe, NP  Polyethylene Glycol 4500 POWD 17 g by Does not apply route daily. Patient not taking: Reported on 03/26/2017 10/11/15   Luciana Axe, NP  simvastatin (ZOCOR) 20 MG tablet Take 1 tablet (20 mg total) by mouth daily at 6 PM. 06/07/15   Krebs, Genevie Cheshire, NP  verapamil (CALAN-SR) 240 MG CR tablet Take 1 tablet (240 mg total) by mouth daily. 06/07/15 03/26/17  Luciana Axe, NP    Allergies Paroxetine hcl; Codeine; Fluoxetine; and Paroxetine  Family History  Problem Relation Age of Onset  . Stroke Mother   . Depression Mother   . Heart disease Father   . Alcohol abuse Father   . Osteoporosis Sister   . Kidney disease Sister   . Breast cancer Neg Hx     Social History Social History   Tobacco  Use  . Smoking status: Current Every Day Smoker    Packs/day: 1.00    Years: 45.00    Pack years: 45.00    Types: Cigarettes  . Smokeless tobacco: Never Used  Substance Use Topics  . Alcohol use: No  . Drug use: No    Review of Systems Constitutional: No fever/chills Eyes: Positive for photophobia. ENT: No sore throat. Cardiovascular: Denies chest pain. Respiratory: Denies shortness of breath. Gastrointestinal: No abdominal pain.  Positive for nausea and vomiting. Genitourinary: Negative for dysuria. Musculoskeletal:  Negative for back pain. Skin: Negative for rash. Neurological: Positive for migraine.  ____________________________________________   PHYSICAL EXAM:  VITAL SIGNS: ED Triage Vitals  Enc Vitals Group     BP 05/10/17 1351 118/74     Pulse Rate 05/10/17 1347 80     Resp 05/10/17 1347 18     Temp 05/10/17 1347 98.1 F (36.7 C)     Temp Source 05/10/17 1347 Oral     SpO2 05/10/17 1347 99 %     Weight 05/10/17 1348 125 lb (56.7 kg)     Height 05/10/17 1348 5\' 5"  (1.651 m)     Head Circumference --      Peak Flow --      Pain Score 05/10/17 1348 10   Constitutional: Alert and oriented. Appears uncomfortable. Eyes: Conjunctivae are normal.  ENT   Head: Normocephalic and atraumatic.   Nose: No congestion/rhinnorhea.   Mouth/Throat: Mucous membranes are moist.   Neck: No stridor. Hematological/Lymphatic/Immunilogical: No cervical lymphadenopathy. Cardiovascular: Normal rate, regular rhythm.  No murmurs, rubs, or gallops.  Respiratory: Normal respiratory effort without tachypnea nor retractions. Breath sounds are clear and equal bilaterally. No wheezes/rales/rhonchi. Gastrointestinal: Soft and non tender. No rebound. No guarding.  Genitourinary: Deferred Musculoskeletal: Normal range of motion in all extremities.  Neurologic:  Normal speech and language. No gross focal neurologic deficits are appreciated.  Skin:  Skin is warm, dry and intact. No rash noted. Psychiatric: Mood and affect are normal. Speech and behavior are normal. Patient exhibits appropriate insight and judgment.  ____________________________________________    LABS (pertinent positives/negatives)  None  ____________________________________________   EKG  None  ____________________________________________    RADIOLOGY  None  ____________________________________________   PROCEDURES  Procedures  ____________________________________________   INITIAL IMPRESSION / ASSESSMENT AND  PLAN / ED COURSE  Pertinent labs & imaging results that were available during my care of the patient were reviewed by me and considered in my medical decision making (see chart for details).  Patient presented to the emergency department today because of concerns for migraines.  Patient does have a history of migraines states been a couple years since she has had one this bad.  No focal neuro deficits on exam.  Patient was given IV fluids and Compazine.  She states this did not give complete resolution so further medications were ordered.  She however declined further medications.  She stated she would rather go home.  I did discuss with the patient.  She states that the only medications at work are narcotics.  Did have a discussion with patient given concern for narcotics and rebound headaches.  Additionally stated I would like to try other non-noncardiac medications prior.  She however declined stating she would rather go home and go to bed.   ____________________________________________   FINAL CLINICAL IMPRESSION(S) / ED DIAGNOSES  Final diagnoses:  Migraine syndrome     Note: This dictation was prepared with Dragon dictation. Any transcriptional errors that result from this  process are unintentional     Nance Pear, MD 05/10/17 1535

## 2017-05-10 NOTE — Discharge Instructions (Addendum)
Please seek medical attention for any high fevers, chest pain, shortness of breath, change in behavior, persistent vomiting, bloody stool or any other new or concerning symptoms.  

## 2017-05-10 NOTE — ED Triage Notes (Signed)
Migraine headache x1 week , hx of same , unable to find relief with home medications, + photosensitive

## 2017-05-10 NOTE — ED Notes (Signed)
Pt not wanting additional medication , " it will not work , narcotic is usually what helps " Dr.Goodman at bedside

## 2017-07-30 ENCOUNTER — Telehealth: Payer: Self-pay | Admitting: Internal Medicine

## 2017-07-30 ENCOUNTER — Encounter: Payer: Self-pay | Admitting: Internal Medicine

## 2017-07-30 ENCOUNTER — Ambulatory Visit: Payer: Medicare Other | Admitting: Internal Medicine

## 2017-07-30 VITALS — BP 128/64 | HR 69 | Temp 97.5°F | Ht 65.0 in | Wt 117.8 lb

## 2017-07-30 DIAGNOSIS — N649 Disorder of breast, unspecified: Secondary | ICD-10-CM | POA: Diagnosis not present

## 2017-07-30 DIAGNOSIS — F419 Anxiety disorder, unspecified: Secondary | ICD-10-CM | POA: Diagnosis not present

## 2017-07-30 DIAGNOSIS — R7303 Prediabetes: Secondary | ICD-10-CM | POA: Diagnosis not present

## 2017-07-30 DIAGNOSIS — E8941 Symptomatic postprocedural ovarian failure: Secondary | ICD-10-CM | POA: Insufficient documentation

## 2017-07-30 DIAGNOSIS — M810 Age-related osteoporosis without current pathological fracture: Secondary | ICD-10-CM

## 2017-07-30 DIAGNOSIS — L988 Other specified disorders of the skin and subcutaneous tissue: Secondary | ICD-10-CM

## 2017-07-30 DIAGNOSIS — F32A Depression, unspecified: Secondary | ICD-10-CM | POA: Insufficient documentation

## 2017-07-30 DIAGNOSIS — G43909 Migraine, unspecified, not intractable, without status migrainosus: Secondary | ICD-10-CM

## 2017-07-30 DIAGNOSIS — I10 Essential (primary) hypertension: Secondary | ICD-10-CM

## 2017-07-30 DIAGNOSIS — I2510 Atherosclerotic heart disease of native coronary artery without angina pectoris: Secondary | ICD-10-CM | POA: Insufficient documentation

## 2017-07-30 DIAGNOSIS — Z1329 Encounter for screening for other suspected endocrine disorder: Secondary | ICD-10-CM | POA: Diagnosis not present

## 2017-07-30 DIAGNOSIS — G4700 Insomnia, unspecified: Secondary | ICD-10-CM | POA: Insufficient documentation

## 2017-07-30 DIAGNOSIS — F329 Major depressive disorder, single episode, unspecified: Secondary | ICD-10-CM

## 2017-07-30 DIAGNOSIS — F17200 Nicotine dependence, unspecified, uncomplicated: Secondary | ICD-10-CM | POA: Diagnosis not present

## 2017-07-30 DIAGNOSIS — F4000 Agoraphobia, unspecified: Secondary | ICD-10-CM | POA: Insufficient documentation

## 2017-07-30 DIAGNOSIS — J449 Chronic obstructive pulmonary disease, unspecified: Secondary | ICD-10-CM

## 2017-07-30 MED ORDER — BUTALBITAL-APAP-CAFFEINE 50-325-40 MG PO TABS: 1.0000 | ORAL_TABLET | Freq: Four times a day (QID) | ORAL | 2 refills | Status: DC | PRN

## 2017-07-30 MED ORDER — ALPRAZOLAM 0.5 MG PO TABS: 0.5000 mg | ORAL_TABLET | Freq: Every evening | ORAL | 2 refills | Status: DC | PRN

## 2017-07-30 NOTE — Telephone Encounter (Signed)
Try to see if can get prolia approved   tMS

## 2017-07-30 NOTE — Patient Instructions (Addendum)
Meditation apps Calm, insight timer or headspace  sch fasting labs and f/u in 2-3 months  Take care  Let us know which psychiatrist you would like to go to   Major Depressive Disorder, Adult Major depressive disorder (MDD) is a mental health condition. MDD often makes you feel sad, hopeless, or helpless. MDD can also cause symptoms in your body. MDD can affect your:  Work.  School.  Relationships.  Other normal activities.  MDD can range from mild to very bad. It may occur once (single episode MDD). It can also occur many times (recurrent MDD). The main symptoms of MDD often include:  Feeling sad, depressed, or irritable most of the time.  Loss of interest.  MDD symptoms also include:  Sleeping too much or too little.  Eating too much or too little.  A change in your weight.  Feeling tired (fatigue) or having low energy.  Feeling worthless.  Feeling guilty.  Trouble making decisions.  Trouble thinking clearly.  Thoughts of suicide or harming others.  Feeling weak.  Feeling agitated.  Keeping yourself from being around other people (isolation).  Follow these instructions at home: Activity  Do these things as told by your doctor: ? Go back to your normal activities. ? Exercise regularly. ? Spend time outdoors. Alcohol  Talk with your doctor about how alcohol can affect your antidepressant medicines.  Do not drink alcohol. Or, limit how much alcohol you drink. ? This means no more than 1 drink a day for nonpregnant women and 2 drinks a day for men. One drink equals one of these:  12 oz of beer.  5 oz of wine.  1 oz of hard liquor. General instructions  Take over-the-counter and prescription medicines only as told by your doctor.  Eat a healthy diet.  Get plenty of sleep.  Find activities that you enjoy. Make time to do them.  Think about joining a support group. Your doctor may be able to suggest a group for you.  Keep all follow-up visits  as told by your doctor. This is important. Where to find more information:  [employer] on Mental Illness: ? www.nami.[place]: ? https://carter.com/  National Suicide Prevention Lifeline: ? (402)478-9737. This is free, 24-hour help. Contact a doctor if:  Your symptoms get worse.  You have new symptoms. Get help right away if:  You self-harm.  You see, hear, taste, smell, or feel things that are not present (hallucinate). If you ever feel like you may hurt yourself or others, or have thoughts about taking your own life, get help right away. You can go to your nearest emergency department or call:  Your local emergency services (911 in the [place]  A suicide crisis helpline, such as the National Suicide Prevention Lifeline: ? 5052703661. This is open 24 hours a day.  This information is not intended to replace advice given to you by your health care provider. Make sure you discuss any questions you have with your health care provider. Document Released: 01/01/2015 Document Revised: 10/07/2015 Document Reviewed: 10/07/2015 Elsevier Interactive Patient Education  2017 [place].  Generalized Anxiety Disorder, Adult Generalized anxiety disorder (GAD) is a mental health disorder. People with this condition constantly worry about everyday events. Unlike normal anxiety, worry related to GAD is not triggered by a specific event. These worries also do not fade or get better with time. GAD interferes with life functions, including relationships, work, and school. GAD can vary from mild to  severe. People with severe GAD can have intense waves of anxiety with physical symptoms (panic attacks). What are the causes? The exact cause of GAD is not known. What increases the risk? This condition is more likely to develop in:  Women.  People who have a family history of anxiety disorders.  People who are very shy.  People who experience very  stressful life events, such as the death of a loved one.  People who have a very stressful family environment.  What are the signs or symptoms? People with GAD often worry excessively about many things in their lives, such as their health and family. They may also be overly concerned about:  Doing well at work.  Being on time.  Natural disasters.  Friendships.  Physical symptoms of GAD include:  Fatigue.  Muscle tension or having muscle twitches.  Trembling or feeling shaky.  Being easily startled.  Feeling like your heart is pounding or racing.  Feeling out of breath or like you cannot take a deep breath.  Having trouble falling asleep or staying asleep.  Sweating.  Nausea, diarrhea, or irritable bowel syndrome (IBS).  Headaches.  Trouble concentrating or remembering facts.  Restlessness.  Irritability.  How is this diagnosed? Your health care provider can diagnose GAD based on your symptoms and medical history. You will also have a physical exam. The health care provider will ask specific questions about your symptoms, including how severe they are, when they started, and if they come and go. Your health care provider may ask you about your use of alcohol or drugs, including prescription medicines. Your health care provider may refer you to a mental health specialist for further evaluation. Your health care provider will do a thorough examination and may perform additional tests to rule out other possible causes of your symptoms. To be diagnosed with GAD, a person must have anxiety that:  Is out of his or her control.  Affects several different aspects of his or her life, such as work and relationships.  Causes distress that makes him or her unable to take part in normal activities.  Includes at least three physical symptoms of GAD, such as restlessness, fatigue, trouble concentrating, irritability, muscle tension, or sleep problems.  Before your health care  provider can confirm a diagnosis of GAD, these symptoms must be present more days than they are not, and they must last for six months or longer. How is this treated? The following therapies are usually used to treat GAD:  Medicine. Antidepressant medicine is usually prescribed for long-term daily control. Antianxiety medicines may be added in severe cases, especially when panic attacks occur.  Talk therapy (psychotherapy). Certain types of talk therapy can be helpful in treating GAD by providing support, education, and guidance. Options include: ? Cognitive behavioral therapy (CBT). People learn coping skills and techniques to ease their anxiety. They learn to identify unrealistic or negative thoughts and behaviors and to replace them with positive ones. ? Acceptance and commitment therapy (ACT). This treatment teaches people how to be mindful as a way to cope with unwanted thoughts and feelings. ? Biofeedback. This process trains you to manage your body's response (physiological response) through breathing techniques and relaxation methods. You will work with a therapist while machines are used to monitor your physical symptoms.  Stress management techniques. These include yoga, meditation, and exercise.  A mental health specialist can help determine which treatment is best for you. Some people see improvement with one type of therapy. However, other  people require a combination of therapies. Follow these instructions at home:  Take over-the-counter and prescription medicines only as told by your health care provider.  Try to maintain a normal routine.  Try to anticipate stressful situations and allow extra time to manage them.  Practice any stress management or self-calming techniques as taught by your health care provider.  Do not punish yourself for setbacks or for not making progress.  Try to recognize your accomplishments, even if they are small.  Keep all follow-up visits as told  by your health care provider. This is important. Contact a health care provider if:  Your symptoms do not get better.  Your symptoms get worse.  You have signs of depression, such as: ? A persistently sad, cranky, or irritable mood. ? Loss of enjoyment in activities that used to bring you joy. ? Change in weight or eating. ? Changes in sleeping habits. ? Avoiding friends or family members. ? Loss of energy for normal tasks. ? Feelings of guilt or worthlessness. Get help right away if:  You have serious thoughts about hurting yourself or others. If you ever feel like you may hurt yourself or others, or have thoughts about taking your own life, get help right away. You can go to your nearest emergency department or call:  Your local emergency services (911 in the [place]  A suicide crisis helpline, such as the [facility] at 251-446-7339. This is open 24 hours a day.  Summary  Generalized anxiety disorder (GAD) is a mental health disorder that involves worry that is not triggered by a specific event.  People with GAD often worry excessively about many things in their lives, such as their health and family.  GAD may cause physical symptoms such as restlessness, trouble concentrating, sleep problems, frequent sweating, nausea, diarrhea, headaches, and trembling or muscle twitching.  A mental health specialist can help determine which treatment is best for you. Some people see improvement with one type of therapy. However, other people require a combination of therapies. This information is not intended to replace advice given to you by your health care provider. Make sure you discuss any questions you have with your health care provider. Document Released: 05/17/2012 Document Revised: 12/11/2015 Document Reviewed: 12/11/2015 Elsevier Interactive Patient Education  2018 [place] is a mental health disorder. It is a type of  anxiety or fear. People with agoraphobia fear public places where they may be trapped, helpless, or embarrassed in the event of a panic attack or a loss of control. They often start to avoid the feared situations or insist that another person go with them. Agoraphobia may interfere with normal daily activities and personal relationships. People with severe agoraphobia may become completely homebound and dependent on others for grocery shopping and other errands. Agoraphobia usually begins before age 59, but it can start in the older adult years. People with agoraphobia are at risk for other anxiety disorders, depression, and substance abuse. What are the causes? It is not known exactly what causes agoraphobia. What increases the risk? Agoraphobia is more common in women. People who have panic disorder or have family members with agoraphobia are at higher risk of developing agoraphobia. What are the signs or symptoms? You may have agoraphobia if you have the following symptoms for 6 months or longer:  Intense fear about two or more of the following: ? Using public transportation, such as cars, buses, planes, trains, or ships. ? Being in open spaces, such  as parking lots, shopping malls, or bridges. ? Being in enclosed spaces, such as shops, theaters, or elevators. ? Standing in line or being in a crowd. ? Being outside the home alone.  Fear that is due to thoughts of being unable to escape or get help if certain events occur. The feared event may be a panic attack or panic-like symptoms, such as a racing heart, dizziness, and trouble breathing. In older people, the feared event may be a fall or loss of bowel control.  Reacting to feared situations by: ? Avoiding them. ? Requiring the presence of a companion. ? Enduring them with intense fear or anxiety.  Fear or anxiety that is out of proportion to the actual danger that is posed by the event and the situation.  How is this  diagnosed? Agoraphobia may be diagnosed by your health care provider. You will be asked questions about your fears and how they have affected you. You may be asked about your medical history and your use of medicines, alcohol, or drugs. Your health care provider may do a physical exam and order lab tests or other studies to rule out a medical condition. You may also be referred to a mental health specialist. How is this treated? Treatment usually includes a combination of counseling and medicines.  Counseling or talk therapy. Talk therapy is provided by mental health specialists. The following forms of talk therapy can be especially helpful: ? Cognitive therapy. Cognitive therapy helps you to recognize and change unrealistic thoughts and beliefs that contribute to your fears. ? Exposure therapy. Exposure therapy helps you to face and overcome your fears in a relaxed state and a safe environment.  Medicines. The following types of medicines may be helpful: ? Antidepressants. Antidepressants are believed to affect certain chemicals in your brain. They can decrease general levels of anxiety and can help to prevent panic attacks. ? Benzodiazepines. These medicines block feelings of anxiety and panic. They are very effective and act more quickly than antidepressants, but they are highly addictive. These medicines are recommended only for short-term use. ? Beta blockers. Beta blockers can reduce physical symptoms of anxiety, such as a racing heart, sweating, and tremor. They may help you to feel less tense and anxious.  Follow these instructions at home:  Keep all follow-up visits as directed by your health care provider. This is important.  Take all medicines only as directed by your health care provider.  Try to exercise, eat a healthy diet, and get plenty of sleep.  Do not drink alcohol.  Do not use illegal drugs. Where to find more information: For more information, visit the website of the  Anxiety and Depression Association of [place] (ADAA): https://www.clark.net/ Contact a health care provider if:  Your fear or anxiety gets worse.  You have new fears or anxieties. Get help right away if:  You have serious thoughts about hurting yourself or someone else.  You have trouble breathing or have chest pain. This information is not intended to replace advice given to you by your health care provider. Make sure you discuss any questions you have with your health care provider. Document Released: 06/12/2010 Document Revised: 06/28/2015 Document Reviewed: 05/23/2013 Elsevier Interactive Patient Education  [employer].

## 2017-07-30 NOTE — Progress Notes (Addendum)
Chief Complaint  Patient presents with  . Establish Care   New patient  1. Anxiety and depression and agoraphobia had tried several medications in the past (I.e prozac needed to be in straight jacket, paxil no help and side effects, celexa no help, busbar no help, effexor no help). She was on Xanax 1 mg tid prn at one pt.  She reports also she has insomnia on sleeping medication now sleeping 3-4 hrs qhs. PHQ 9 score today 24 she has been to psych and therapy in the past and does not want to see Dr. Gretel Acre or CBC Dr. Tomma Rakers again.  She denies SI or plan or HI but at times wonders if world would be better off w/o her. She reports she some sort of traumatic experiences with immediate family at a younger age and then due to endometriosis s/p hysterectomy could not have kids and this has been traumatic.   2. HTN controlled on verapamil  3. Smoker since teenager < 1ppd now but max was 1-2 ppd no FH lung cancer. CT lung 10/22/16 mild COPD, LUL lung nodule, DDD T spine, CAD, atherosclerosis  4. Menopausal hot flashes wants refill of estrogen disc with pt estrogen contraindicated in smokers.   5. Migraines uses imitrex prn and fiorocet prn but not had migraine in several months  6. DEXAQ 02/27/16 osteoporosis oral medications made her nauseated   Review of Systems  Constitutional: Negative for weight loss.  HENT: Negative for hearing loss.   Eyes: Negative for blurred vision.  Cardiovascular: Negative for chest pain.  Gastrointestinal: Negative for abdominal pain.  Genitourinary:       +hot flashes.    Musculoskeletal: Negative for falls.  Skin: Negative for rash.  Neurological: Negative for headaches.  Psychiatric/Behavioral: Positive for depression. Negative for suicidal ideas. The patient is nervous/anxious and has insomnia.    Past Medical History:  Diagnosis Date  . Allergy   . Arthritis   . Depression   . GERD (gastroesophageal reflux disease)   . Headache   . Hyperlipidemia   .  Hypertension   . Migraine   . Seizure [facility])    Past Surgical History:  Procedure Laterality Date  . ABDOMINAL HYSTERECTOMY  1984  . COLONOSCOPY WITH PROPOFOL N/A 03/26/2017   Procedure: COLONOSCOPY WITH PROPOFOL;  Surgeon: Jonathon Bellows, MD;  Location: [facility] ENDOSCOPY;  Service: Gastroenterology;  Laterality: N/A;  . WISDOM TOOTH EXTRACTION     Family History  Problem Relation Age of Onset  . Stroke Mother   . Depression Mother   . Heart disease Father   . Alcohol abuse Father   . Osteoporosis Sister   . Kidney disease Sister   . Breast cancer Neg Hx    Social History   Socioeconomic History  . Marital status: Divorced    Spouse name: Not on file  . Number of children: Not on file  . Years of education: Not on file  . Highest education level: Not on file  Occupational History  . Not on file  Social Needs  . Financial resource strain: Not on file  . Food insecurity:    Worry: Not on file    Inability: Not on file  . Transportation needs:    Medical: Not on file    Non-medical: Not on file  Tobacco Use  . Smoking status: Current Every Day Smoker    Packs/day: 1.00    Years: 45.00    Pack years: 45.00    Types: Cigarettes  . Smokeless  tobacco: Never Used  Substance and Sexual Activity  . Alcohol use: No  . Drug use: No  . Sexual activity: Never  Lifestyle  . Physical activity:    Days per week: Not on file    Minutes per session: Not on file  . Stress: Not on file  Relationships  . Social connections:    Talks on phone: Not on file    Gets together: Not on file    Attends religious service: Not on file    Active member of club or organization: Not on file    Attends meetings of clubs or organizations: Not on file    Relationship status: Not on file  . Intimate partner violence:    Fear of current or ex partner: Not on file    Emotionally abused: Not on file    Physically abused: Not on file    Forced sexual activity: Not on file  Other Topics Concern  .  Not on file  Social History Narrative  . Not on file   Current Meds  Medication Sig  . BABY ASPIRIN PO Take by mouth.  . Calcium-Magnesium-Vitamin D (CALCIUM 500 PO) Take by mouth.  . cholecalciferol (VITAMIN D) 1000 units tablet Take 1,000 Units by mouth daily.  Marland Kitchen dimenhyDRINATE (DRAMAMINE) 50 MG tablet Take 50 mg by mouth daily.  Marland Kitchen estradiol (ESTRACE) 0.5 MG tablet Take 0.5 mg by mouth daily.  . Naproxen Sodium (ALEVE PO) Take by mouth.  . pantoprazole (PROTONIX) 40 MG tablet Take 1 tablet (40 mg total) by mouth daily.  . simvastatin (ZOCOR) 20 MG tablet Take 1 tablet (20 mg total) by mouth daily at 6 PM.  . tiZANidine (ZANAFLEX) 4 MG capsule Take 4 mg by mouth 2 (two) times daily.   Allergies  Allergen Reactions  . Paroxetine Hcl Other (See Comments)    Pt. Becomes very aggressive with mood swings.  . Codeine Nausea And Vomiting  . Fluoxetine Anxiety  . Paroxetine Anxiety   No results found for this or any previous visit (from the past 2160 hour(s)). Objective  Body mass index is 19.6 kg/m. Wt Readings from Last 3 Encounters:  07/30/17 117 lb 12.8 oz (53.4 kg)  05/10/17 125 lb (56.7 kg)  03/26/17 125 lb (56.7 kg)   Temp Readings from Last 3 Encounters:  07/30/17 (!) 97.5 F (36.4 C) (Oral)  05/10/17 98.1 F (36.7 C) (Oral)  03/26/17 (!) 97.2 F (36.2 C)   BP Readings from Last 3 Encounters:  07/30/17 128/64  05/10/17 137/84  03/26/17 92/72   Pulse Readings from Last 3 Encounters:  07/30/17 69  05/10/17 74  03/26/17 (!) 56    Physical Exam  Constitutional: She is oriented to person, place, and time. Vital signs are normal. She appears well-developed and well-nourished. She is cooperative.  HENT:  Head: Normocephalic and atraumatic.  Mouth/Throat: Oropharynx is clear and moist and mucous membranes are normal.  Eyes: Pupils are equal, round, and reactive to light. Conjunctivae are normal.  Cardiovascular: Normal rate, regular rhythm and normal heart sounds.    ?cardiac murmur    Pulmonary/Chest: Effort normal and breath sounds normal.    Neurological: She is alert and oriented to person, place, and time. Gait normal.  Skin: Skin is warm, dry and intact. Lesion noted.     Psychiatric: She has a normal mood and affect. Her speech is normal and behavior is normal. Judgment and thought content normal. Cognition and memory are normal.  Nursing note and vitals  reviewed.   Assessment   1. Anxiety with agoraphobia, depression, insomnia  2. htn controlled  3. ? Cardiac murmur reassess at f/u, CAD, atherosclerosis  4. Tobacco abuse with mild emphysema and LUL lung nodule on CT chest 10/22/16 5. Menopausal sx's  6. Migraines history none for now  7. HM Plan   1.  Refer to psychiatry pt given list to choose psych of her choices locally and let us know  Referred to [facility]  Xanax 0.5 bid prn advised pt not long term and needs establish with psych   2. Cont meds  3. Reassess murmur at f/u  rec smoking cessation  On aspirin 81 mg qd  4. rec cessation due for CT chest 10/22/17 to f/u lung nodule 5.  Will not refill estrogen due to smoker and at risk stroke and heart attack  6. Cont prn imitrex, fiorocet  7.  Declines flu shot, pna vaccines, shingles-reassess at f/u  ? Last Tdap  -no Tdap in [facility]   S/p hysterectomy endometriosis no h/o abnormal pap ask at f/u if cervix still intact  Mammogram 03/11/17 normal  Colonoscopy 03/26/17 polyps tubular and hyperplastic Dr. Vicente Males neg malignancy repeat in 5 years  dexa 02/27/16 osteoporosis oral meds made her nauseated  -will try to get prolia approved -vit D 47.4 06/22/15  CT chest due 10/22/17 teenager started smoking max 1-2 ppd now <1 ppd no FH lung cancer Refer dermatology [facility]   Declines STD check   Try to get copy of HIV, hep B/C labs from [facility]  Most recent pcp Dr. Cletis [place] 2019  Provider: Dr. Olivia Mackie McLean-Scocuzza-Internal Medicine

## 2017-07-30 NOTE — Progress Notes (Signed)
Pre visit review using our clinic review tool, if applicable. No additional management support is needed unless otherwise documented below in the visit note. 

## 2017-07-30 NOTE — Telephone Encounter (Signed)
Check [facility] for Tdap vaccine

## 2017-08-05 ENCOUNTER — Other Ambulatory Visit (INDEPENDENT_AMBULATORY_CARE_PROVIDER_SITE_OTHER): Payer: Medicare Other

## 2017-08-05 ENCOUNTER — Encounter: Payer: Self-pay | Admitting: *Deleted

## 2017-08-05 DIAGNOSIS — I2510 Atherosclerotic heart disease of native coronary artery without angina pectoris: Secondary | ICD-10-CM

## 2017-08-05 DIAGNOSIS — R7303 Prediabetes: Secondary | ICD-10-CM | POA: Diagnosis not present

## 2017-08-05 DIAGNOSIS — Z1329 Encounter for screening for other suspected endocrine disorder: Secondary | ICD-10-CM | POA: Diagnosis not present

## 2017-08-05 DIAGNOSIS — F419 Anxiety disorder, unspecified: Secondary | ICD-10-CM

## 2017-08-05 DIAGNOSIS — I10 Essential (primary) hypertension: Secondary | ICD-10-CM | POA: Diagnosis not present

## 2017-08-05 LAB — COMPREHENSIVE METABOLIC PANEL
ALT: 9 U/L (ref 0–35)
AST: 14 U/L (ref 0–37)
Albumin: 4.1 g/dL (ref 3.5–5.2)
Alkaline Phosphatase: 69 U/L (ref 39–117)
BUN: 8 mg/dL (ref 6–23)
CALCIUM: 9.2 mg/dL (ref 8.4–10.5)
CHLORIDE: 105 meq/L (ref 96–112)
CO2: 29 meq/L (ref 19–32)
CREATININE: 1 mg/dL (ref 0.40–1.20)
GFR: 59.88 mL/min — AB (ref >60.00)
Glucose, Bld: 96 mg/dL (ref 70–99)
Potassium: 4.4 mEq/L (ref 3.5–5.1)
Sodium: 141 mEq/L (ref 135–145)
Total Bilirubin: 0.3 mg/dL (ref 0.2–1.2)
Total Protein: 6.9 g/dL (ref 6.0–8.3)

## 2017-08-05 LAB — LIPID PANEL
CHOL/HDL RATIO: 3
CHOLESTEROL: 142 mg/dL (ref 0–200)
HDL: 48.1 mg/dL (ref >39.00)
LDL CALC: 55 mg/dL (ref 0–99)
NonHDL: 93.96
TRIGLYCERIDES: 196 mg/dL — AB (ref 0.0–149.0)
VLDL: 39.2 mg/dL (ref 0.0–40.0)

## 2017-08-05 LAB — T4, FREE: FREE T4: 0.66 ng/dL (ref 0.60–1.60)

## 2017-08-05 LAB — CBC WITH DIFFERENTIAL/PLATELET
BASOS ABS: 0 10*3/uL (ref 0.0–0.1)
BASOS PCT: 0.8 % (ref 0.0–3.0)
EOS ABS: 0.2 10*3/uL (ref 0.0–0.7)
Eosinophils Relative: 3.8 % (ref 0.0–5.0)
HEMATOCRIT: 37.6 % (ref 36.0–46.0)
Hemoglobin: 12.9 g/dL (ref 12.0–15.0)
LYMPHS ABS: 2.3 10*3/uL (ref 0.7–4.0)
LYMPHS PCT: 38.4 % (ref 12.0–46.0)
MCHC: 34.2 g/dL (ref 30.0–36.0)
MCV: 99.4 fl (ref 78.0–100.0)
MONO ABS: 0.4 10*3/uL (ref 0.1–1.0)
Monocytes Relative: 6.8 % (ref 3.0–12.0)
NEUTROS ABS: 3 10*3/uL (ref 1.4–7.7)
NEUTROS PCT: 50.2 % (ref 43.0–77.0)
PLATELETS: 185 10*3/uL (ref 150.0–400.0)
RBC: 3.79 Mil/uL — ABNORMAL LOW (ref 3.87–5.11)
RDW: 13.8 % (ref 11.5–15.5)
WBC: 6.1 10*3/uL (ref 4.0–10.5)

## 2017-08-05 LAB — HEMOGLOBIN A1C: Hgb A1c MFr Bld: 5.5 % (ref 4.6–6.5)

## 2017-08-05 LAB — TSH: TSH: 1.81 u[IU]/mL (ref 0.35–4.50)

## 2017-08-05 NOTE — Addendum Note (Signed)
Addended by: Arby Barrette on: 08/05/2017 08:36 AM   Modules accepted: Orders

## 2017-08-05 NOTE — Telephone Encounter (Signed)
Patient not in [facility]. 

## 2017-08-06 LAB — MICROSCOPIC EXAMINATION: Casts: NONE SEEN /lpf

## 2017-08-06 LAB — URINALYSIS, ROUTINE W REFLEX MICROSCOPIC
BILIRUBIN UA: NEGATIVE
Glucose, UA: NEGATIVE
Ketones, UA: NEGATIVE
LEUKOCYTES UA: NEGATIVE
Nitrite, UA: NEGATIVE
PROTEIN UA: NEGATIVE
SPEC GRAV UA: 1.01 (ref 1.005–1.030)
Urobilinogen, Ur: 0.2 mg/dL (ref 0.2–1.0)
pH, UA: 5.5 (ref 5.0–7.5)

## 2017-08-11 ENCOUNTER — Telehealth: Payer: Self-pay | Admitting: *Deleted

## 2017-08-11 NOTE — Telephone Encounter (Signed)
Copied from [place] [phone]. Topic: General - Other >> Aug 11, 2017 11:35 AM Yvette Rack wrote: Reason for CRM: Pt states the headache medicine that was prescribed helps with the headaches but it causes nausea. Pt states she already takes dimenhyDRINATE (DRAMAMINE) 50 MG tablet for the nausea but she requests a call back to discuss other options if there are any. Cb# [phone]

## 2017-08-11 NOTE — Telephone Encounter (Signed)
Please advise 

## 2017-08-11 NOTE — Telephone Encounter (Signed)
Unread mychart message mailed to patient 

## 2017-08-13 ENCOUNTER — Ambulatory Visit: Payer: Medicare Other | Admitting: Psychology

## 2017-08-13 DIAGNOSIS — F418 Other specified anxiety disorders: Secondary | ICD-10-CM | POA: Diagnosis not present

## 2017-08-14 ENCOUNTER — Telehealth: Payer: Self-pay

## 2017-08-14 ENCOUNTER — Other Ambulatory Visit: Payer: Self-pay

## 2017-08-14 ENCOUNTER — Telehealth: Payer: Self-pay | Admitting: Internal Medicine

## 2017-08-14 DIAGNOSIS — G43909 Migraine, unspecified, not intractable, without status migrainosus: Secondary | ICD-10-CM

## 2017-08-14 MED ORDER — BUTALBITAL-APAP-CAFFEINE 50-325-40 MG PO TABS: 1.0000 | ORAL_TABLET | Freq: Four times a day (QID) | ORAL | 2 refills | Status: DC | PRN

## 2017-08-14 NOTE — Telephone Encounter (Signed)
Copied from [place] [phone]. Topic: Quick Communication - Rx Refill/Question >> Aug 14, 2017  4:21 PM Bea Graff, NT wrote: Medication: butalbital-acetaminophen-caffeine (FIORICET, ESGIC) 50-325-40 MG tablet   Has the patient contacted their pharmacy? Yes.   (Agent: If no, request that the patient contact the pharmacy for the refill.) (Agent: If yes, when and what did the pharmacy advise?)  Preferred Pharmacy (with phone number or street name): [place], [place] - [place] [phone] (Phone) [phone] (Fax)      Agent: Please be advised that RX refills may take up to 3 business days. We ask that you follow-up with your pharmacy.

## 2017-08-14 NOTE — Telephone Encounter (Signed)
Copied from [place] [phone]. Topic: Quick Communication - See Telephone Encounter >> Aug 14, 2017  1:26 PM Antonieta Iba C wrote: CRM for notification. See Telephone encounter for: 08/14/17.   Pt is requesting a call back from office  CB: [phone]

## 2017-08-17 NOTE — Telephone Encounter (Signed)
Spoken to patient. She doe not need to speak with anyone at office. Her situation has changed and we are not needed.

## 2017-08-26 ENCOUNTER — Other Ambulatory Visit: Payer: Self-pay | Admitting: Internal Medicine

## 2017-08-26 DIAGNOSIS — G43909 Migraine, unspecified, not intractable, without status migrainosus: Secondary | ICD-10-CM

## 2017-08-26 NOTE — Telephone Encounter (Signed)
Copied from [place] [phone]. Topic: Quick Communication - Rx Refill/Question >> Aug 26, 2017  3:25 PM Oliver Pila B wrote: Medication: butalbital-acetaminophen-caffeine Emelda Brothers, [facility]) 50-325-40 MG tablet [[id number]]   Has the patient contacted their pharmacy? Yes.   (Agent: If no, request that the patient contact the pharmacy for the refill.) (Agent: If yes, when and what did the pharmacy advise?)  Preferred Pharmacy (with phone number or street [facility]  Agent: Please be advised that RX refills may take up to 3 business days. We ask that you follow-up with your pharmacy.

## 2017-08-26 NOTE — Telephone Encounter (Signed)
In process 

## 2017-08-27 ENCOUNTER — Ambulatory Visit: Payer: Medicare Other | Admitting: Psychology

## 2017-08-27 DIAGNOSIS — F418 Other specified anxiety disorders: Secondary | ICD-10-CM | POA: Diagnosis not present

## 2017-08-27 NOTE — Telephone Encounter (Signed)
Fioricet refill request by patient, it was refilled on 08/14/17 30 tab/2 refills printed, will need to resend to the pharmacy.  [facility] [address] [place], [place] - [place] [phone] (Phone) [phone] (Fax)

## 2017-08-28 ENCOUNTER — Telehealth: Payer: Self-pay

## 2017-08-28 NOTE — Telephone Encounter (Signed)
Re send rx

## 2017-08-28 NOTE — Telephone Encounter (Signed)
Copied from [place] [phone]. Topic: General - Other >> Aug 28, 2017  9:03 AM Keene Breath wrote: Reason for CRM: Patient called to speak with the nurse or doctor regarding her medications.  Patient would like a call back to discuss if this is the correct medication she should be taking.  CB# [phone].

## 2017-08-31 MED ORDER — BUTALBITAL-APAP-CAFFEINE 50-325-40 MG PO TABS: 1.0000 | ORAL_TABLET | Freq: Four times a day (QID) | ORAL | 2 refills | Status: DC | PRN

## 2017-08-31 NOTE — Telephone Encounter (Signed)
Please let her know cost and ok if agreeable to cost  [place]

## 2017-08-31 NOTE — Telephone Encounter (Addendum)
Insurance verification for Prolia filed on [employer] 08/17/17, recieved approval for Prolia verification 08/31/17, patient portion will be $ 225 plus injection for 6 month s medication ok to schedule ?

## 2017-09-01 NOTE — Telephone Encounter (Signed)
mychart message sent to patient to ask what is wrong.

## 2017-09-09 ENCOUNTER — Telehealth: Payer: Self-pay | Admitting: Internal Medicine

## 2017-09-09 NOTE — Telephone Encounter (Signed)
Copied from [place]. Topic: General - Other >> Sep 09, 2017  8:59 AM Judyann Munson wrote: Reason for CRM: Patient is calling to advise she is unable to have a bowel movement for the past 5 days, she is requesting to see if something can be called in. Please advise   [place], [place] - [place] [phone] (Phone) [phone] (Fax)

## 2017-09-09 NOTE — Telephone Encounter (Signed)
No needs appt  Can try otc meds I.e miralax or senna colace  Warm prune juice  Eat more fiber and drink more water   TMS

## 2017-09-17 ENCOUNTER — Ambulatory Visit: Payer: Medicare Other | Admitting: Psychology

## 2017-09-24 ENCOUNTER — Ambulatory Visit: Payer: Medicare Other | Admitting: Psychology

## 2017-09-24 DIAGNOSIS — F418 Other specified anxiety disorders: Secondary | ICD-10-CM

## 2017-09-25 ENCOUNTER — Telehealth: Payer: Self-pay | Admitting: Internal Medicine

## 2017-09-25 NOTE — Telephone Encounter (Signed)
Please advise 

## 2017-09-25 NOTE — Telephone Encounter (Unsigned)
Copied from [place] [phone]. Topic: Quick Communication - See Telephone Encounter >> Sep 25, 2017  1:19 PM Neva Seat wrote: Pt's therapist sent pt's information to [facility] 3 weeks ago.  Pt hasn't heard from them and cannot reach by phone because she keeps getting an answering machine. Pt is needing during the day help.  The one at night is not helping during the day.

## 2017-09-28 NOTE — Telephone Encounter (Signed)
mychart message has been sent to inform patient. 

## 2017-09-28 NOTE — Telephone Encounter (Signed)
Pt is following up on this request.  She states she still has not been able to reach [facility]. To get an appt and is still having trouble with her anxiety during the day.  She states she would like to Dr McLean-Scocuzza to consider increasing her ALPRAZolam (XANAX) 0.5 MG tablet to 1.0 mg until she can get an appt with them.  Please advise.

## 2017-09-28 NOTE — Telephone Encounter (Signed)
No and I will not refill this medication further Please have pt call psychiatry and establish with them   [place]

## 2017-09-30 ENCOUNTER — Other Ambulatory Visit: Payer: Self-pay | Admitting: Internal Medicine

## 2017-09-30 ENCOUNTER — Telehealth: Payer: Self-pay | Admitting: Internal Medicine

## 2017-09-30 DIAGNOSIS — G43909 Migraine, unspecified, not intractable, without status migrainosus: Secondary | ICD-10-CM

## 2017-09-30 NOTE — Telephone Encounter (Signed)
Copied from [place] [id number]. Topic: Quick Communication - Rx Refill/Question >> Sep 30, 2017  5:08 PM Percell Belt A wrote: Medication: butalbital-acetaminophen-caffeine Emelda Brothers, ESGIC) 50-325-40 MG tablet [[id number]] - Pharmacy called in and stated that pt is out of this med and need to speak with nurse about the instructions of this med  Has the patient contacted their pharmacy? yes (Agent: If no, request that the patient contact the pharmacy for the refill.) (Agent: If yes, when and what did the pharmacy advise?)  Preferred Pharmacy (with phone number or street name): [place], [place] - [place] [phone] (Phone)   Agent: Please be advised that RX refills may take up to 3 business days. We ask that you follow-up with your pharmacy.

## 2017-10-01 NOTE — Telephone Encounter (Signed)
Medication clarification

## 2017-10-02 NOTE — Telephone Encounter (Signed)
Patient calling to check status of medication being sent to the pharmacy. Would like a call once this is complete. States that the pharmacy closes at [place], [place]

## 2017-10-02 NOTE — Telephone Encounter (Signed)
Medication request.

## 2017-10-07 MED ORDER — BUTALBITAL-APAP-CAFFEINE 50-325-40 MG PO TABS: 1.0000 | ORAL_TABLET | Freq: Four times a day (QID) | ORAL | 2 refills | Status: DC | PRN

## 2017-10-08 ENCOUNTER — Ambulatory Visit: Payer: Medicare Other | Admitting: Internal Medicine

## 2017-10-08 ENCOUNTER — Ambulatory Visit: Payer: Medicare Other | Admitting: Psychology

## 2017-10-08 ENCOUNTER — Encounter: Payer: Self-pay | Admitting: Internal Medicine

## 2017-10-08 VITALS — BP 130/80 | HR 76 | Temp 98.2°F | Ht 65.0 in | Wt 112.8 lb

## 2017-10-08 DIAGNOSIS — F17219 Nicotine dependence, cigarettes, with unspecified nicotine-induced disorders: Secondary | ICD-10-CM

## 2017-10-08 DIAGNOSIS — M810 Age-related osteoporosis without current pathological fracture: Secondary | ICD-10-CM

## 2017-10-08 DIAGNOSIS — R911 Solitary pulmonary nodule: Secondary | ICD-10-CM | POA: Diagnosis not present

## 2017-10-08 DIAGNOSIS — F329 Major depressive disorder, single episode, unspecified: Secondary | ICD-10-CM

## 2017-10-08 DIAGNOSIS — G43909 Migraine, unspecified, not intractable, without status migrainosus: Secondary | ICD-10-CM

## 2017-10-08 DIAGNOSIS — F419 Anxiety disorder, unspecified: Secondary | ICD-10-CM

## 2017-10-08 DIAGNOSIS — G4700 Insomnia, unspecified: Secondary | ICD-10-CM

## 2017-10-08 DIAGNOSIS — J449 Chronic obstructive pulmonary disease, unspecified: Secondary | ICD-10-CM | POA: Diagnosis not present

## 2017-10-08 DIAGNOSIS — F32A Depression, unspecified: Secondary | ICD-10-CM

## 2017-10-08 DIAGNOSIS — I10 Essential (primary) hypertension: Secondary | ICD-10-CM

## 2017-10-08 MED ORDER — VERAPAMIL HCL ER 240 MG PO TBCR: 240.0000 mg | EXTENDED_RELEASE_TABLET | Freq: Every day | ORAL | 3 refills | Status: DC

## 2017-10-08 MED ORDER — NORTRIPTYLINE HCL 10 MG PO CAPS: ORAL_CAPSULE | ORAL | 2 refills | Status: DC

## 2017-10-08 MED ORDER — SUMATRIPTAN SUCCINATE 50 MG PO TABS: 50.0000 mg | ORAL_TABLET | Freq: Four times a day (QID) | ORAL | 2 refills | Status: DC | PRN

## 2017-10-08 NOTE — Progress Notes (Signed)
Chief Complaint  Patient presents with  . Migraine   F/u  1. Migraines uncontrolled Tuesday had bad migraines and having migraine since 1984 hysterectomy. She takes fiorcet prn, imitrex prn and Advil otc too much which advil is not helping  2. Anxiety/depression/insomnia not sch appt yet with psychiatry but will [facility]  3. Tobacco abuse, h/o lung nodule. Mild copd ct chest due 10/22/17  4. Osteoporosis unable to afford prolia and orals caused nausea.  5. HTN controlled verapamil 240 mg qd    Review of Systems  Constitutional: Negative for weight loss.  Respiratory: Negative for shortness of breath.   Cardiovascular: Negative for chest pain.  Skin: Negative for rash.  Neurological: Negative for headaches.  Psychiatric/Behavioral: Negative for depression. The patient is nervous/anxious and has insomnia.    Past Medical History:  Diagnosis Date  . Allergy   . Anxiety    agoraphobia   . Arthritis   . Chicken pox   . Depression   . GERD (gastroesophageal reflux disease)   . Headache   . History of blood transfusion    1984  . Hyperlipidemia   . Hypertension   . Migraine   . Seizure [facility])    Past Surgical History:  Procedure Laterality Date  . ABDOMINAL HYSTERECTOMY  1984   endometriosis  . COLONOSCOPY WITH PROPOFOL N/A 03/26/2017   Procedure: COLONOSCOPY WITH PROPOFOL;  Surgeon: Jonathon Bellows, MD;  Location: [facility] ENDOSCOPY;  Service: Gastroenterology;  Laterality: N/A;  . WISDOM TOOTH EXTRACTION     Family History  Problem Relation Age of Onset  . Stroke Mother   . Depression Mother   . Arthritis Mother   . Heart disease Father   . Alcohol abuse Father   . Diabetes Father   . Osteoporosis Sister   . Kidney disease Sister   . Arthritis Sister   . Arthritis Maternal Grandmother   . Heart disease Maternal Grandmother   . Hyperlipidemia Maternal Grandmother   . Diabetes Paternal Grandmother   . Breast cancer Neg Hx    Social History   Socioeconomic History   . Marital status: Divorced    Spouse name: Not on file  . Number of children: Not on file  . Years of education: Not on file  . Highest education level: Not on file  Occupational History  . Not on file  Social Needs  . Financial resource strain: Not on file  . Food insecurity:    Worry: Not on file    Inability: Not on file  . Transportation needs:    Medical: Not on file    Non-medical: Not on file  Tobacco Use  . Smoking status: Current Every Day Smoker    Packs/day: 1.00    Years: 45.00    Pack years: 45.00    Types: Cigarettes  . Smokeless tobacco: Never Used  Substance and Sexual Activity  . Alcohol use: No  . Drug use: No  . Sexual activity: Never  Lifestyle  . Physical activity:    Days per week: Not on file    Minutes per session: Not on file  . Stress: Not on file  Relationships  . Social connections:    Talks on phone: Not on file    Gets together: Not on file    Attends religious service: Not on file    Active member of club or organization: Not on file    Attends meetings of clubs or organizations: Not on file    Relationship  status: Not on file  . Intimate partner violence:    Fear of current or ex partner: Not on file    Emotionally abused: Not on file    Physically abused: Not on file    Forced sexual activity: Not on file  Other Topics Concern  . Not on file  Social History Narrative   Disabled    Married no kids    No guns, wears seat belt, feels safe in relationship    12th grade ed.    Current Meds  Medication Sig  . ALPRAZolam (XANAX) 0.5 MG tablet Take 1 tablet (0.5 mg total) by mouth at bedtime as needed for anxiety.  Marland Kitchen BABY ASPIRIN PO Take by mouth.  . butalbital-acetaminophen-caffeine (FIORICET, ESGIC) 50-325-40 MG tablet Take 1-2 tablets by mouth every 6 (six) hours as needed for headache.  . Calcium-Magnesium-Vitamin D (CALCIUM 500 PO) Take by mouth.  . cholecalciferol (VITAMIN D) 1000 units tablet Take 1,000 Units by mouth daily.   Marland Kitchen dimenhyDRINATE (DRAMAMINE) 50 MG tablet Take 50 mg by mouth daily.  . pantoprazole (PROTONIX) 40 MG tablet Take 1 tablet (40 mg total) by mouth daily.  . simvastatin (ZOCOR) 20 MG tablet Take 1 tablet (20 mg total) by mouth daily at 6 PM.  . SUMAtriptan (IMITREX) 50 MG tablet Take 50 mg by mouth every 2 (two) hours as needed for migraine. May repeat in 2 hours if headache persists or recurs.  Marland Kitchen tiZANidine (ZANAFLEX) 4 MG capsule Take 4 mg by mouth 2 (two) times daily.   Allergies  Allergen Reactions  . Paroxetine Hcl Other (See Comments)    Pt. Becomes very aggressive with mood swings.  . Codeine Nausea And Vomiting  . Fluoxetine Anxiety  . Paroxetine Anxiety   Recent Results (from the past 2160 hour(s))  Hemoglobin A1c     Status: None   Collection Time: 08/05/17  8:25 AM  Result Value Ref Range   Hgb A1c MFr Bld 5.5 4.6 - 6.5 %    Comment: Glycemic Control Guidelines for People with Diabetes:Non Diabetic:  <6%Goal of Therapy: <7%Additional Action Suggested:  >8%   T4, free     Status: None   Collection Time: 08/05/17  8:25 AM  Result Value Ref Range   Free T4 0.66 0.60 - 1.60 ng/dL    Comment: Specimens from patients who are undergoing biotin therapy and /or ingesting biotin supplements may contain high levels of biotin.  The higher biotin concentration in these specimens interferes with this Free T4 assay.  Specimens that contain high levels  of biotin may cause false high results for this Free T4 assay.  Please interpret results in light of the total clinical presentation of the patient.    TSH     Status: None   Collection Time: 08/05/17  8:25 AM  Result Value Ref Range   TSH 1.81 0.35 - 4.50 uIU/mL  Lipid panel     Status: Abnormal   Collection Time: 08/05/17  8:25 AM  Result Value Ref Range   Cholesterol 142 0 - 200 mg/dL    Comment: ATP III Classification       Desirable:  < 200 mg/dL               Borderline High:  200 - 239 mg/dL          High:  > = 240 mg/dL    Triglycerides 196.0 (H) 0.0 - 149.0 mg/dL    Comment: Normal:  <150 mg/dLBorderline High:  150 - 199 mg/dL   HDL 48.10 >39.00 mg/dL   VLDL 39.2 0.0 - 40.0 mg/dL   LDL Cholesterol 55 0 - 99 mg/dL   Total CHOL/HDL Ratio 3     Comment:                Men          Women1/2 Average Risk     3.4          3.3Average Risk          5.0          4.42X Average Risk          9.6          7.13X Average Risk          15.0          11.0                       NonHDL 93.96     Comment: NOTE:  Non-HDL goal should be 30 mg/dL higher than patient's LDL goal (i.e. LDL goal of < 70 mg/dL, would have non-HDL goal of < 100 mg/dL)  CBC with Differential/Platelet     Status: Abnormal   Collection Time: 08/05/17  8:25 AM  Result Value Ref Range   WBC 6.1 4.0 - 10.5 K/uL   RBC 3.79 (L) 3.87 - 5.11 Mil/uL   Hemoglobin 12.9 12.0 - 15.0 g/dL   HCT 37.6 36.0 - 46.0 %   MCV 99.4 78.0 - 100.0 fl   MCHC 34.2 30.0 - 36.0 g/dL   RDW 13.8 11.5 - 15.5 %   Platelets 185.0 150.0 - 400.0 K/uL   Neutrophils Relative % 50.2 43.0 - 77.0 %   Lymphocytes Relative 38.4 12.0 - 46.0 %   Monocytes Relative 6.8 3.0 - 12.0 %   Eosinophils Relative 3.8 0.0 - 5.0 %   Basophils Relative 0.8 0.0 - 3.0 %   Neutro Abs 3.0 1.4 - 7.7 K/uL   Lymphs Abs 2.3 0.7 - 4.0 K/uL   Monocytes Absolute 0.4 0.1 - 1.0 K/uL   Eosinophils Absolute 0.2 0.0 - 0.7 K/uL   Basophils Absolute 0.0 0.0 - 0.1 K/uL  Comprehensive metabolic panel     Status: Abnormal   Collection Time: 08/05/17  8:25 AM  Result Value Ref Range   Sodium 141 135 - 145 mEq/L   Potassium 4.4 3.5 - 5.1 mEq/L   Chloride 105 96 - 112 mEq/L   CO2 29 19 - 32 mEq/L   Glucose, Bld 96 70 - 99 mg/dL   BUN 8 6 - 23 mg/dL   Creatinine, Ser 1.00 0.40 - 1.20 mg/dL   Total Bilirubin 0.3 0.2 - 1.2 mg/dL   Alkaline Phosphatase 69 39 - 117 U/L   AST 14 0 - 37 U/L   ALT 9 0 - 35 U/L   Total Protein 6.9 6.0 - 8.3 g/dL   Albumin 4.1 3.5 - 5.2 g/dL   Calcium 9.2 8.4 - 10.5 mg/dL   GFR 59.88 (L)  >60.00 mL/min  Urinalysis, Routine w reflex microscopic     Status: Abnormal   Collection Time: 08/05/17  8:36 AM  Result Value Ref Range   Specific Gravity, UA 1.010 1.005 - 1.030   pH, UA 5.5 5.0 - 7.5   Color, UA Yellow Yellow   Appearance Ur Clear Clear   Leukocytes, UA Negative Negative   Protein, UA Negative Negative/Trace   Glucose, UA  Negative Negative   Ketones, UA Negative Negative   RBC, UA Trace (A) Negative   Bilirubin, UA Negative Negative   Urobilinogen, Ur 0.2 0.2 - 1.0 mg/dL   Nitrite, UA Negative Negative   Microscopic Examination See below:     Comment: Microscopic was indicated and was performed.  Microscopic Examination     Status: None   Collection Time: 08/05/17  8:36 AM  Result Value Ref Range   WBC, UA 0-5 0 - 5 /hpf   RBC, UA 0-2 0 - 2 /hpf   Epithelial Cells (non renal) 0-10 0 - 10 /hpf   Casts None seen None seen /lpf   Mucus, UA Present Not Estab.   Bacteria, UA Few None seen/Few   Objective  Body mass index is 18.77 kg/m. Wt Readings from Last 3 Encounters:  10/08/17 112 lb 12.8 oz (51.2 kg)  07/30/17 117 lb 12.8 oz (53.4 kg)  05/10/17 125 lb (56.7 kg)   Temp Readings from Last 3 Encounters:  10/08/17 98.2 F (36.8 C) (Oral)  07/30/17 (!) 97.5 F (36.4 C) (Oral)  05/10/17 98.1 F (36.7 C) (Oral)   BP Readings from Last 3 Encounters:  10/08/17 130/80  07/30/17 128/64  05/10/17 137/84   Pulse Readings from Last 3 Encounters:  10/08/17 76  07/30/17 69  05/10/17 74    Physical Exam  Constitutional: She is oriented to person, place, and time. Vital signs are normal. She appears well-developed and well-nourished. She is cooperative.  HENT:  Head: Normocephalic and atraumatic.  Mouth/Throat: Oropharynx is clear and moist and mucous membranes are normal.  Eyes: Pupils are equal, round, and reactive to light. Conjunctivae are normal.  Cardiovascular: Normal rate and regular rhythm.  Murmur heard. Pulmonary/Chest: Effort normal and  breath sounds normal. She has no wheezes.  Neurological: She is alert and oriented to person, place, and time. Gait normal.  Skin: Skin is warm, dry and intact.  Psychiatric: She has a normal mood and affect. Her speech is normal and behavior is normal. Judgment and thought content normal. Cognition and memory are normal.  Nursing note and vitals reviewed.   Assessment   1. Migraines  2. Anxiety/depression/insomnia  3. Tobacco abuse  4. Osteoporosis  5. HM 6. HTN controlled  Plan   1. Trial of nortriptyline 10 mg qhs x 1 week then 20 mg qhs unable to afford neurology consult appt at this time will hold  2. See above   F/u osman  Pt to sch appt with [facility] has not yet  Will not refill xanax if indicated psych should fill  3. Referred CT chest due 10/22/17  rec smoking cessation smoking 15 cig/day now  4. Declines prolia 2/2 cost  5.  Declines flu shot, pna vaccines, shingles-reassess at f/u  ? Last Tdap  -no Tdap in [facility]   S/p hysterectomy 1984 endometriosis no h/o abnormal pap ask at f/u if cervix still intact  Mammogram 03/11/17 normal  Colonoscopy 03/26/17 polyps tubular and hyperplastic Dr. Vicente Males neg malignancy repeat in 5 years  dexa 02/27/16 osteoporosis oral meds made her nauseated  -will try to get prolia approved approved pt cant afford 2/2 cost  -vit D 47.4 06/22/15  CT chest due 10/22/17 teenager started smoking max 1-2 ppd now <1 ppd no FH lung cancer -referred today Refer dermatology [facility]  Dr. Brendolyn Patty will call sch appt when cant afford right now  Declines STD check   Try to get copy of HIV, hep B/C labs  from [place]  Most recent pcp Dr. Cletis [place] 2019   6. Refilled verapamil 240 mg qd     Provider: Dr. Olivia Mackie McLean-Scocuzza-[facility]

## 2017-10-08 NOTE — Telephone Encounter (Signed)
Patient was informed today at appointment.

## 2017-10-08 NOTE — Patient Instructions (Addendum)
Please sch appt with psychiatry 1st  Take nortriptyline 10 mg at night night week 1 and then 2 pills at night=20 mg at night   Call dermatology when you can to schedule appt [phone]   I ordered CT scan chest due 10/22/17   F/u in 4-6 weeks   Nortriptyline capsules What is this medicine? NORTRIPTYLINE (nor TRIP ti leen) is used to treat depression. This medicine may be used for other purposes; ask your health care provider or pharmacist if you have questions. COMMON BRAND NAME(S): Aventyl, Pamelor What should I tell my health care provider before I take this medicine? They need to know if you have any of these conditions: -an alcohol problem -bipolar disorder or schizophrenia -difficulty passing urine, prostate trouble -glaucoma -heart disease or recent heart attack -liver disease -over active thyroid -seizures -thoughts or plans of suicide or a previous suicide attempt or family history of suicide attempt -an unusual or allergic reaction to nortriptyline, other medicines, foods, dyes, or preservatives -pregnant or trying to get pregnant -breast-feeding How should I use this medicine? Take this medicine by mouth with a glass of water. Follow the directions on the prescription label. Take your doses at regular intervals. Do not take it more often than directed. Do not stop taking this medicine suddenly except upon the advice of your doctor. Stopping this medicine too quickly may cause serious side effects or your condition may worsen. A special MedGuide will be given to you by the pharmacist with each prescription and refill. Be sure to read this information carefully each time. Talk to your pediatrician regarding the use of this medicine in children. Special care may be needed. Overdosage: If you think you have taken too much of this medicine contact a poison control center or emergency room at once. NOTE: This medicine is only for you. Do not share this medicine with  others. What if I miss a dose? If you miss a dose, take it as soon as you can. If it is almost time for your next dose, take only that dose. Do not take double or extra doses. What may interact with this medicine? Do not take this medicine with any of the following medications: -arsenic trioxide -certain medicines medicines for irregular heart beat -cisapride -halofantrine -linezolid -MAOIs like Carbex, Eldepryl, Marplan, Nardil, and Parnate -methylene blue (injected into a vein) -other medicines for mental depression -phenothiazines like perphenazine, thioridazine and chlorpromazine -pimozide -probucol -procarbazine -sparfloxacin -St. John's Wort -ziprasidone This medicine may also interact with any of the following medications: -atropine and related drugs like hyoscyamine, scopolamine, tolterodine and others -barbiturate medicines for inducing sleep or treating seizures, such as phenobarbital -cimetidine -medicines for diabetes -medicines for seizures like carbamazepine or phenytoin -reserpine -thyroid medicine This list may not describe all possible interactions. Give your health care provider a list of all the medicines, herbs, non-prescription drugs, or dietary supplements you use. Also tell them if you smoke, drink alcohol, or use illegal drugs. Some items may interact with your medicine. What should I watch for while using this medicine? Tell your doctor if your symptoms do not get better or if they get worse. Visit your doctor or health care professional for regular checks on your progress. Because it may take several weeks to see the full effects of this medicine, it is important to continue your treatment as prescribed by your doctor. Patients and their families should watch out for new or worsening thoughts of suicide or depression. Also watch out for sudden  changes in feelings such as feeling anxious, agitated, panicky, irritable, hostile, aggressive, impulsive, severely  restless, overly excited and hyperactive, or not being able to sleep. If this happens, especially at the beginning of treatment or after a change in dose, call your health care professional. Dennis Bast may get drowsy or dizzy. Do not drive, use machinery, or do anything that needs mental alertness until you know how this medicine affects you. Do not stand or sit up quickly, especially if you are an older patient. This reduces the risk of dizzy or fainting spells. Alcohol may interfere with the effect of this medicine. Avoid alcoholic drinks. Do not treat yourself for coughs, colds, or allergies without asking your doctor or health care professional for advice. Some ingredients can increase possible side effects. Your mouth may get dry. Chewing sugarless gum or sucking hard candy, and drinking plenty of water may help. Contact your doctor if the problem does not go away or is severe. This medicine may cause dry eyes and blurred vision. If you wear contact lenses you may feel some discomfort. Lubricating drops may help. See your eye doctor if the problem does not go away or is severe. This medicine can cause constipation. Try to have a bowel movement at least every 2 to 3 days. If you do not have a bowel movement for 3 days, call your doctor or health care professional. This medicine can make you more sensitive to the sun. Keep out of the sun. If you cannot avoid being in the sun, wear protective clothing and use sunscreen. Do not use sun lamps or tanning beds/booths. What side effects may I notice from receiving this medicine? Side effects that you should report to your doctor or health care professional as soon as possible: -allergic reactions like skin rash, itching or hives, swelling of the face, lips, or tongue -anxious -breathing problems -changes in vision -confusion -elevated mood, decreased need for sleep, racing thoughts, impulsive behavior -eye pain -fast, irregular heartbeat -feeling faint or  lightheaded, falls -feeling agitated, angry, or irritable -fever with increased sweating -hallucination, loss of contact with reality -seizures -stiff muscles -suicidal thoughts or other mood changes -tingling, pain, or numbness in the feet or hands -trouble passing urine or change in the amount of urine -trouble sleeping -unusually weak or tired -vomiting -yellowing of the eyes or skin Side effects that usually do not require medical attention (report to your doctor or health care professional if they continue or are bothersome): -change in sex drive or performance -change in appetite or weight -constipation -dizziness -dry mouth -nausea -tired -tremors -upset stomach This list may not describe all possible side effects. Call your doctor for medical advice about side effects. You may report side effects to FDA at 1-800-FDA-1088. Where should I keep my medicine? Keep out of the reach of children. Store at room temperature between 15 and 30 degrees C (59 and 86 degrees F). Keep container tightly closed. Throw away any unused medicine after the expiration date. NOTE: This sheet is a summary. It may not cover all possible information. If you have questions about this medicine, talk to your doctor, pharmacist, or health care provider.  2018 Elsevier/Gold Standard (2015-06-22 12:53:08)   Cholesterol Cholesterol is a white, waxy, fat-like substance that is needed by the human body in small amounts. The liver makes all the cholesterol we need. Cholesterol is carried from the liver by the blood through the blood vessels. Deposits of cholesterol (plaques) may build up on blood vessel (artery) walls.  Plaques make the arteries narrower and stiffer. Cholesterol plaques increase the risk for heart attack and stroke. You cannot feel your cholesterol level even if it is very high. The only way to know that it is high is to have a blood test. Once you know your cholesterol levels, you should keep a  record of the test results. Work with your health care provider to keep your levels in the desired range. What do the results mean?  Total cholesterol is a rough measure of all the cholesterol in your blood.  LDL (low-density lipoprotein) is the "bad" cholesterol. This is the type that causes plaque to build up on the artery walls. You want this level to be low.  HDL (high-density lipoprotein) is the "good" cholesterol because it cleans the arteries and carries the LDL away. You want this level to be high.  Triglycerides are fat that the body can either burn for energy or store. High levels are closely linked to heart disease. What are the desired levels of cholesterol?  Total cholesterol below 200.  LDL below 100 for people who are at risk, below 70 for people at very high risk.  HDL above 40 is good. A level of 60 or higher is considered to be protective against heart disease.  Triglycerides below 150. How can I lower my cholesterol? Diet Follow your diet program as told by your health care provider.  Choose fish or white meat chicken and [place], roasted or baked. Limit fatty cuts of red meat, fried foods, and processed meats, such as sausage and lunch meats.  Eat lots of fresh fruits and vegetables.  Choose whole grains, beans, pasta, potatoes, and cereals.  Choose olive oil, corn oil, or canola oil, and use only small amounts.  Avoid butter, mayonnaise, shortening, or palm kernel oils.  Avoid foods with trans fats.  Drink skim or nonfat milk and eat low-fat or nonfat yogurt and cheeses. Avoid whole milk, cream, ice cream, egg yolks, and full-fat cheeses.  Healthier desserts include angel food cake, ginger snaps, animal crackers, hard candy, popsicles, and low-fat or nonfat frozen yogurt. Avoid pastries, cakes, pies, and cookies.  Exercise  Follow your exercise program as told by your health care provider. A regular program: ? Helps to decrease LDL and raise HDL. ? Helps  with weight control.  Do things that increase your activity level, such as gardening, walking, and taking the stairs.  Ask your health care provider about ways that you can be more active in your daily life.  Medicine  Take over-the-counter and prescription medicines only as told by your health care provider. ? Medicine may be prescribed by your health care provider to help lower cholesterol and decrease the risk for heart disease. This is usually done if diet and exercise have failed to bring down cholesterol levels. ? If you have several risk factors, you may need medicine even if your levels are normal.  This information is not intended to replace advice given to you by your health care provider. Make sure you discuss any questions you have with your health care provider. Document Released: 10/15/2000 Document Revised: 08/18/2015 Document Reviewed: 07/21/2015 Elsevier Interactive Patient Education  [employer].

## 2017-10-08 NOTE — Progress Notes (Signed)
Pre visit review using our clinic review tool, if applicable. No additional management support is needed unless otherwise documented below in the visit note. 

## 2017-10-08 NOTE — Telephone Encounter (Signed)
Please see message below

## 2017-10-09 ENCOUNTER — Other Ambulatory Visit: Payer: Self-pay

## 2017-10-09 ENCOUNTER — Telehealth: Payer: Self-pay

## 2017-10-09 DIAGNOSIS — Z7982 Long term (current) use of aspirin: Secondary | ICD-10-CM | POA: Insufficient documentation

## 2017-10-09 DIAGNOSIS — I10 Essential (primary) hypertension: Secondary | ICD-10-CM | POA: Diagnosis not present

## 2017-10-09 DIAGNOSIS — I2510 Atherosclerotic heart disease of native coronary artery without angina pectoris: Secondary | ICD-10-CM | POA: Diagnosis not present

## 2017-10-09 DIAGNOSIS — Z79899 Other long term (current) drug therapy: Secondary | ICD-10-CM | POA: Diagnosis not present

## 2017-10-09 DIAGNOSIS — R51 Headache: Secondary | ICD-10-CM | POA: Diagnosis present

## 2017-10-09 DIAGNOSIS — F17210 Nicotine dependence, cigarettes, uncomplicated: Secondary | ICD-10-CM | POA: Diagnosis not present

## 2017-10-09 NOTE — Telephone Encounter (Signed)
Please advise patient.  

## 2017-10-09 NOTE — Telephone Encounter (Signed)
Copied from [place] [id number]. Topic: Referral - Status >> Oct 09, 2017 10:44 AM Scherrie Gerlach wrote: Reason for CRM: pt states [facility] wanted $150 fee just to set up her dr. Abbott Pao states she cannot do that.  Refuses to go to [facility] clinic and does not know what to do now. Pt is really wanting to go somewhere, and would like advice now. She states [facility] did not even suggest they will work with her, and hung up on her. Please advise.

## 2017-10-09 NOTE — Telephone Encounter (Signed)
She can call [facility] They also have walk ins   [place]

## 2017-10-09 NOTE — ED Triage Notes (Signed)
Pt arrives to ED via [facility] from home with c/o migraine x2 days. Pt with h/x of same. Pt reporting taking r/x'd medications as prescribed without relief. Pt is slurry words and states it's because of her 'headache medcations". Pt states the only things that works are narcotics but PCP will not r/x them.

## 2017-10-09 NOTE — Telephone Encounter (Signed)
Patient has been informed and given number to patient to contact them.

## 2017-10-09 NOTE — Telephone Encounter (Signed)
Patient says she cannot afford Prolia at this time.

## 2017-10-10 ENCOUNTER — Emergency Department
Admission: EM | Admit: 2017-10-10 | Discharge: 2017-10-10 | Disposition: A | Discharge status: Home / Self Care | Payer: Medicare Other | Attending: Emergency Medicine | Admitting: Emergency Medicine

## 2017-10-10 ENCOUNTER — Telehealth: Payer: Self-pay

## 2017-10-10 DIAGNOSIS — R51 Headache: Secondary | ICD-10-CM

## 2017-10-10 DIAGNOSIS — R519 Headache, unspecified: Secondary | ICD-10-CM

## 2017-10-10 MED ORDER — SODIUM CHLORIDE 0.9 % IV BOLUS
1000.0000 mL | Freq: Once | INTRAVENOUS | Status: AC
  Administered 2017-10-10: 1000 mL via INTRAVENOUS

## 2017-10-10 MED ORDER — PROPOFOL 10 MG/ML IV BOLUS
50.0000 mg | Freq: Once | INTRAVENOUS | Status: AC
  Administered 2017-10-10: 50 mg via INTRAVENOUS
  Filled 2017-10-10: qty 20

## 2017-10-10 NOTE — ED Provider Notes (Signed)
[facility] Emergency Department Provider Note  ____________________________________________   First MD Initiated Contact with Patient 10/10/17 0012     (approximate)  I have reviewed the triage vital signs and the nursing notes.   HISTORY  Chief Complaint Migraine   HPI Kayla Shah is a 62 y.o. female comes the emergency department via EMS with "migraine" headache for the past 2 days.  She has a long-standing history of headaches.  This feels identical to previous.  Gradual onset not maximal onset slowly progressive bifrontal throbbing headache associated with nausea.  She has previously seen Dr. Melrose Nakayama who evaluated her for her headaches and determined these were not infected migraine headaches but likely secondary to coingestion.  She denies fevers or chills.  Denies numbness or weakness.  Denies chest pain shortness of breath abdominal pain nausea vomiting.  She has used over-the-counter medications with no effect.   Past Medical History:  Diagnosis Date  . Allergy   . Anxiety    agoraphobia   . Arthritis   . Chicken pox   . Depression   . GERD (gastroesophageal reflux disease)   . Headache   . History of blood transfusion    1984  . Hyperlipidemia   . Hypertension   . Migraine   . Seizure [facility])     Patient Active Problem List   Diagnosis Date Noted  . Lung nodule 10/08/2017  . Anxiety and depression 07/30/2017  . Agoraphobia 07/30/2017  . CAD (coronary artery disease) 07/30/2017  . Hot flashes due to surgical menopause 07/30/2017  . Insomnia 07/30/2017  . Osteoporosis 02/27/2016  . Personal history of tobacco use, presenting hazards to health 10/23/2015  . Primary osteoarthritis of left elbow 10/11/2015  . Migraine 06/07/2015  . Seizure ([place]) 06/07/2015  . Smoker 06/07/2015  . Gastroesophageal reflux disease without esophagitis 06/07/2015  . Chronic obstructive bronchitis ([place]) 08/14/2014  . Essential (primary) hypertension  08/14/2014  . Pure hypercholesterolemia 08/14/2014  . Abnormal brain scan 08/29/2013  . Clinical depression 08/29/2013    Past Surgical History:  Procedure Laterality Date  . ABDOMINAL HYSTERECTOMY  1984   endometriosis  . COLONOSCOPY WITH PROPOFOL N/A 03/26/2017   Procedure: COLONOSCOPY WITH PROPOFOL;  Surgeon: Jonathon Bellows, MD;  Location: [facility] ENDOSCOPY;  Service: Gastroenterology;  Laterality: N/A;  . WISDOM TOOTH EXTRACTION      Prior to Admission medications   Medication Sig Start Date End Date Taking? Authorizing Provider  ALPRAZolam Duanne Moron) 0.5 MG tablet Take 1 tablet (0.5 mg total) by mouth at bedtime as needed for anxiety. 07/30/17   McLean-Scocuzza, Nino Glow, MD  BABY ASPIRIN PO Take by mouth.    [provider]  butalbital-acetaminophen-caffeine (FIORICET, ESGIC) [phone] MG tablet Take 1-2 tablets by mouth every 6 (six) hours as needed for headache. 10/07/17 10/07/18  McLean-Scocuzza, Nino Glow, MD  Calcium-Magnesium-Vitamin D (CALCIUM 500 PO) Take by mouth.    [provider]  cholecalciferol (VITAMIN D) 1000 units tablet Take 1,000 Units by mouth daily.    [provider]  dimenhyDRINATE (DRAMAMINE) 50 MG tablet Take 50 mg by mouth daily.    [provider]  nortriptyline (PAMELOR) 10 MG capsule 1 pill at night x 1 week, may increase to 2 pills at night week 2 10/08/17   McLean-Scocuzza, Nino Glow, MD  pantoprazole (PROTONIX) 40 MG tablet Take 1 tablet (40 mg total) by mouth daily. 06/07/15   Krebs, Genevie Cheshire, NP  simvastatin (ZOCOR) 20 MG tablet Take 1 tablet (20  mg total) by mouth daily at 6 PM. 06/07/15   Krebs, Genevie Cheshire, NP  SUMAtriptan (IMITREX) 50 MG tablet Take 1 tablet (50 mg total) by mouth every 6 (six) hours as needed for migraine. May repeat in 2 hours if headache persists or recurs no more than 4 in 1 day no more than 2x per week 10/08/17   McLean-Scocuzza, Nino Glow, MD  tiZANidine (ZANAFLEX) 4 MG capsule Take 4 mg by mouth 2 (two) times daily.     [provider]  verapamil (CALAN-SR) 240 MG CR tablet Take 1 tablet (240 mg total) by mouth daily. 10/08/17 01/06/18  McLean-Scocuzza, Nino Glow, MD    Allergies Paroxetine hcl; Codeine; Fluoxetine; and Paroxetine  Family History  Problem Relation Age of Onset  . Stroke Mother   . Depression Mother   . Arthritis Mother   . Heart disease Father   . Alcohol abuse Father   . Diabetes Father   . Osteoporosis Sister   . Kidney disease Sister   . Arthritis Sister   . Arthritis Maternal Grandmother   . Heart disease Maternal Grandmother   . Hyperlipidemia Maternal Grandmother   . Diabetes Paternal Grandmother   . Breast cancer Neg Hx     Social History Social History   Tobacco Use  . Smoking status: Current Every Day Smoker    Packs/day: 1.00    Years: 45.00    Pack years: 45.00    Types: Cigarettes  . Smokeless tobacco: Never Used  Substance Use Topics  . Alcohol use: No  . Drug use: No    Review of Systems Constitutional: No fever/chills Eyes: No visual changes. ENT: No sore throat. Cardiovascular: Denies chest pain. Respiratory: Denies shortness of breath. Gastrointestinal: No abdominal pain.  Positive for nausea, no vomiting.  No diarrhea.  No constipation. Genitourinary: Negative for dysuria. Musculoskeletal: Negative for back pain. Skin: Negative for rash. Neurological: Positive for headache   ____________________________________________   PHYSICAL EXAM:  VITAL SIGNS: ED Triage Vitals  Enc Vitals Group     BP 10/09/17 2305 (!) 149/85     Pulse Rate 10/09/17 2305 79     Resp 10/09/17 2305 16     Temp 10/09/17 2305 98.2 F (36.8 C)     Temp Source 10/09/17 2305 Oral     SpO2 10/09/17 2305 98 %     Weight 10/09/17 2305 112 lb (50.8 kg)     Height 10/09/17 2305 5\' 5"  (1.651 m)     Head Circumference --      Peak Flow --      Pain Score 10/09/17 2311 10     Pain Loc --      Pain Edu? --      Excl. in [place]? --     Constitutional: Alert and  oriented x4 appears quite uncomfortable although nontoxic no diaphoresis speaks in full clear sentences Eyes: PERRL EOMI. midrange and brisk Head: Atraumatic. Nose: No congestion/rhinnorhea. Mouth/Throat: No trismus Neck: No stridor.  No meningismus Cardiovascular: Normal rate, regular rhythm. Grossly normal heart sounds.  Good peripheral circulation. Respiratory: Normal respiratory effort.  No retractions. Lungs CTAB and moving good air Gastrointestinal: Soft nontender Musculoskeletal: No lower extremity edema   Neurologic:  Normal speech and language. No gross focal neurologic deficits are appreciated. Skin:  Skin is warm, dry and intact. No rash noted. Psychiatric: Mood and affect are normal. Speech and behavior are normal.    ____________________________________________   DIFFERENTIAL includes but not limited to  Tension  headache, glaucoma, temporal arteritis, migraine headache, meningitis ____________________________________________   LABS (all labs ordered are listed, but only abnormal results are displayed)  Labs Reviewed - No data to display   __________________________________________  EKG   ____________________________________________  RADIOLOGY   ____________________________________________   PROCEDURES  Procedure(s) performed: no  Procedures  Critical Care performed: no  ____________________________________________   INITIAL IMPRESSION / ASSESSMENT AND PLAN / ED COURSE  Pertinent labs & imaging results that were available during my care of the patient were reviewed by me and considered in my medical decision making (see chart for details).   As part of my medical decision making, I reviewed the following data within the [place] History obtained from family if available, nursing notes, old chart and ekg, as well as notes from prior ED visits.  The patient comes to the emergency department with a bifrontal throbbing headache  that is similar to previous headaches.  She describes these as "migraines" however I discussed with the patient and her husband that these are not migraine headaches as they are not unilateral and her neurologist has thoroughly evaluated her and feels they are not migraines.  She has had multiple MRIs in the past.  She is requesting IV Phenergan to help with her nausea.  We discussed a trial of micro-dosing of propofol which she is willing to try.     ----------------------------------------- 1:03 AM on 10/10/2017 -----------------------------------------  Following micro-dosing of propofol for the patient's headache her pain is now down to a 0.  I was present for the duration of the medication and 50 mg total was administered 10 mg at a time over a total of 12 minutes.  At no point did she lose consciousness.  At no point did she stopped breathing.  At no point was this procedural or deep sedation.  This is analogous to using ketamine for pain dosing.  It is consistent with:  Headache. 2000 Mar;40(3):224-30. Intravenous propofol: unique effectiveness in treating intractable migraine. Madelon Lips JC1, Philemon Kingdom, [place] J. ____________________________________________   The patient's headache is improved following the micro-dosing.  I have advised her to return to neurology however she refuses to see Dr. Melrose Nakayama or Dr. Manuella Ghazi and as such I have advised her to seek care at [facility] or [facility] or [place].  Discharged home in improved condition.  FINAL CLINICAL IMPRESSION(S) / ED DIAGNOSES  Final diagnoses:  Bad headache      NEW MEDICATIONS STARTED DURING THIS VISIT:  Discharge Medication List as of 10/10/2017  1:03 AM       Note:  This document was prepared using Dragon voice recognition software and may include unintentional dictation errors.     Darel Hong, MD 10/12/17 2216

## 2017-10-10 NOTE — Telephone Encounter (Signed)
Call pt regarding lung screening scan . Pt is a current smoker. Less than 1/2 pack per day . Would like scan any time but must be on a Wednesday or Thursday .

## 2017-10-10 NOTE — Discharge Instructions (Signed)
Please make sure you remain well-hydrated and follow-up with Dr. Melrose Nakayama for reevaluation of your headache.  Return to the emergency department sooner for any concerns.  It was a pleasure to take care of you today, and thank you for coming to our emergency department.  If you have any questions or concerns before leaving please ask the nurse to grab me and I'm more than happy to go through your aftercare instructions again.  If you were prescribed any opioid pain medication today such as Norco, Vicodin, Percocet, morphine, hydrocodone, or oxycodone please make sure you do not drive when you are taking this medication as it can alter your ability to drive safely.  If you have any concerns once you are home that you are not improving or are in fact getting worse before you can make it to your follow-up appointment, please do not hesitate to call 911 and come back for further evaluation.  Darel Hong, MD

## 2017-10-12 ENCOUNTER — Other Ambulatory Visit: Payer: Self-pay | Admitting: *Deleted

## 2017-10-12 ENCOUNTER — Telehealth: Payer: Self-pay | Admitting: *Deleted

## 2017-10-12 DIAGNOSIS — Z122 Encounter for screening for malignant neoplasm of respiratory organs: Secondary | ICD-10-CM

## 2017-10-12 NOTE — Telephone Encounter (Signed)
Called patient to inform her of LDCT screening appt forThursday 10/22/2017 @ 1:15pm here @[facility] , pt asked for call back tomorrow as she has a migraine and just got home from the ER.  Will call pt back tomorrow and mailed appt today.

## 2017-10-13 ENCOUNTER — Telehealth: Payer: Self-pay | Admitting: *Deleted

## 2017-10-13 NOTE — Telephone Encounter (Signed)
Attempted to contact patient r/t LDCT Screening appointment on 9-19 at 115, unable to reach patient message left and appt mailed to patient on 10-12-17.

## 2017-10-16 ENCOUNTER — Telehealth: Payer: Self-pay | Admitting: Internal Medicine

## 2017-10-16 NOTE — Telephone Encounter (Signed)
Patient was informed by [employer].

## 2017-10-16 NOTE — Telephone Encounter (Signed)
Call pt zocor interacts with verapamil rec cut zocor 20 mg in 1/2=10 mg at night   [place]

## 2017-10-22 ENCOUNTER — Ambulatory Visit: Admission: RE | Admit: 2017-10-22 | Payer: Medicare Other | Source: Ambulatory Visit

## 2017-10-26 ENCOUNTER — Telehealth: Payer: Self-pay | Admitting: *Deleted

## 2017-10-26 ENCOUNTER — Telehealth: Payer: Self-pay | Admitting: Internal Medicine

## 2017-10-26 NOTE — Telephone Encounter (Signed)
Pt voiced understanding of upcoming appt

## 2017-10-26 NOTE — Telephone Encounter (Signed)
Copied from [place] [id number]. Topic: Quick Communication - Rx Refill/Question >> Oct 26, 2017  8:36 AM Margot Ables wrote: Medication: gabapentin - pt is requesting RX from Dr. Terese Door - pt states it helps her move and deal with the pain - it doesn't make her sleepy or nutty - pt states she has been taking 300mg  1xday (sometimes 2xday) that she has taken from her husband - she knows she shouldn't do that and that he shouldn't share his medication - she said he has it from having his thumb amputated and it helps with joint pain so he told her to try it - pt said that she has osteoporosis really bad and she's been advised not to take so much Aleeve and she doesn't know what else to do - she is doing exercises and eating better but needs help - she said that she tried this out of desperation.    Preferred Pharmacy (with phone number or street name): [facility] [address] [place], [place] - [place] [phone] (Phone) [phone] (Fax)

## 2017-10-27 ENCOUNTER — Other Ambulatory Visit: Payer: Self-pay | Admitting: Internal Medicine

## 2017-10-27 DIAGNOSIS — M545 Low back pain: Principal | ICD-10-CM

## 2017-10-27 DIAGNOSIS — G8929 Other chronic pain: Secondary | ICD-10-CM

## 2017-10-27 MED ORDER — GABAPENTIN 300 MG PO CAPS: 300.0000 mg | ORAL_CAPSULE | Freq: Two times a day (BID) | ORAL | 11 refills | Status: DC

## 2017-10-28 ENCOUNTER — Encounter: Payer: Self-pay | Admitting: *Deleted

## 2017-10-28 ENCOUNTER — Ambulatory Visit
Admission: RE | Admit: 2017-10-28 | Discharge: 2017-10-28 | Disposition: A | Discharge status: Home / Self Care | Payer: Medicare Other | Source: Ambulatory Visit | Attending: Oncology | Admitting: Oncology

## 2017-10-28 DIAGNOSIS — Z122 Encounter for screening for malignant neoplasm of respiratory organs: Secondary | ICD-10-CM | POA: Insufficient documentation

## 2017-10-28 DIAGNOSIS — R918 Other nonspecific abnormal finding of lung field: Secondary | ICD-10-CM | POA: Diagnosis not present

## 2017-10-28 DIAGNOSIS — J438 Other emphysema: Secondary | ICD-10-CM | POA: Diagnosis not present

## 2017-10-28 DIAGNOSIS — I2510 Atherosclerotic heart disease of native coronary artery without angina pectoris: Secondary | ICD-10-CM | POA: Diagnosis not present

## 2017-10-28 DIAGNOSIS — I700 Atherosclerosis of aorta: Secondary | ICD-10-CM | POA: Insufficient documentation

## 2017-10-28 DIAGNOSIS — J432 Centrilobular emphysema: Secondary | ICD-10-CM | POA: Diagnosis not present

## 2017-10-28 DIAGNOSIS — F17210 Nicotine dependence, cigarettes, uncomplicated: Secondary | ICD-10-CM | POA: Insufficient documentation

## 2017-10-30 ENCOUNTER — Other Ambulatory Visit: Payer: Self-pay | Admitting: Internal Medicine

## 2017-10-30 DIAGNOSIS — G43909 Migraine, unspecified, not intractable, without status migrainosus: Secondary | ICD-10-CM

## 2017-10-30 MED ORDER — BUTALBITAL-APAP-CAFFEINE 50-325-40 MG PO TABS: 1.0000 | ORAL_TABLET | Freq: Three times a day (TID) | ORAL | 1 refills | Status: DC | PRN

## 2017-11-01 ENCOUNTER — Emergency Department: Payer: Medicare Other

## 2017-11-01 ENCOUNTER — Other Ambulatory Visit: Payer: Self-pay

## 2017-11-01 ENCOUNTER — Emergency Department
Admission: EM | Admit: 2017-11-01 | Discharge: 2017-11-01 | Disposition: A | Discharge status: Home / Self Care | Payer: Medicare Other | Attending: Emergency Medicine | Admitting: Emergency Medicine

## 2017-11-01 ENCOUNTER — Encounter: Payer: Self-pay | Admitting: Emergency Medicine

## 2017-11-01 DIAGNOSIS — Z79899 Other long term (current) drug therapy: Secondary | ICD-10-CM | POA: Diagnosis not present

## 2017-11-01 DIAGNOSIS — F17210 Nicotine dependence, cigarettes, uncomplicated: Secondary | ICD-10-CM | POA: Diagnosis not present

## 2017-11-01 DIAGNOSIS — I2510 Atherosclerotic heart disease of native coronary artery without angina pectoris: Secondary | ICD-10-CM | POA: Insufficient documentation

## 2017-11-01 DIAGNOSIS — E876 Hypokalemia: Secondary | ICD-10-CM | POA: Insufficient documentation

## 2017-11-01 DIAGNOSIS — R4182 Altered mental status, unspecified: Secondary | ICD-10-CM | POA: Diagnosis present

## 2017-11-01 DIAGNOSIS — I10 Essential (primary) hypertension: Secondary | ICD-10-CM | POA: Insufficient documentation

## 2017-11-01 DIAGNOSIS — R440 Auditory hallucinations: Secondary | ICD-10-CM | POA: Insufficient documentation

## 2017-11-01 DIAGNOSIS — F1290 Cannabis use, unspecified, uncomplicated: Secondary | ICD-10-CM | POA: Insufficient documentation

## 2017-11-01 DIAGNOSIS — R569 Unspecified convulsions: Secondary | ICD-10-CM | POA: Diagnosis not present

## 2017-11-01 DIAGNOSIS — Z7982 Long term (current) use of aspirin: Secondary | ICD-10-CM | POA: Insufficient documentation

## 2017-11-01 LAB — URINE DRUG SCREEN, QUALITATIVE (ARMC ONLY)
AMPHETAMINES, UR SCREEN: NOT DETECTED
Barbiturates, Ur Screen: NOT DETECTED
Benzodiazepine, Ur Scrn: NOT DETECTED
CANNABINOID 50 NG, UR ~~LOC~~: POSITIVE — AB
Cocaine Metabolite,Ur ~~LOC~~: NOT DETECTED
MDMA (Ecstasy)Ur Screen: NOT DETECTED
Methadone Scn, Ur: NOT DETECTED
OPIATE, UR SCREEN: NOT DETECTED
PHENCYCLIDINE (PCP) UR S: NOT DETECTED
Tricyclic, Ur Screen: NOT DETECTED

## 2017-11-01 LAB — COMPREHENSIVE METABOLIC PANEL
ALT: 8 U/L (ref 0–44)
AST: 26 U/L (ref 15–41)
Albumin: 4.3 g/dL (ref 3.5–5.0)
Alkaline Phosphatase: 49 U/L (ref 38–126)
Anion gap: 21 — ABNORMAL HIGH (ref 5–15)
BUN: 14 mg/dL (ref 8–23)
CHLORIDE: 103 mmol/L (ref 98–111)
CO2: 16 mmol/L — AB (ref 22–32)
Calcium: 8.9 mg/dL (ref 8.9–10.3)
Creatinine, Ser: 0.87 mg/dL (ref 0.44–1.00)
Glucose, Bld: 160 mg/dL — ABNORMAL HIGH (ref 70–99)
Potassium: 3 mmol/L — ABNORMAL LOW (ref 3.5–5.1)
SODIUM: 140 mmol/L (ref 135–145)
Total Bilirubin: 0.6 mg/dL (ref 0.3–1.2)
Total Protein: 7.7 g/dL (ref 6.5–8.1)

## 2017-11-01 LAB — CBC WITH DIFFERENTIAL/PLATELET
BASOS PCT: 1 %
Basophils Absolute: 0.1 10*3/uL (ref 0–0.1)
EOS ABS: 0.1 10*3/uL (ref 0–0.7)
Eosinophils Relative: 1 %
HCT: 39.1 % (ref 35.0–47.0)
HEMOGLOBIN: 13.4 g/dL (ref 12.0–16.0)
LYMPHS ABS: 2.1 10*3/uL (ref 1.0–3.6)
Lymphocytes Relative: 18 %
MCH: 35.5 pg — AB (ref 26.0–34.0)
MCHC: 34.3 g/dL (ref 32.0–36.0)
MCV: 103.5 fL — ABNORMAL HIGH (ref 80.0–100.0)
Monocytes Absolute: 0.4 10*3/uL (ref 0.2–0.9)
Monocytes Relative: 4 %
NEUTROS PCT: 76 %
Neutro Abs: 8.8 10*3/uL — ABNORMAL HIGH (ref 1.4–6.5)
PLATELETS: 193 10*3/uL (ref 150–440)
RBC: 3.78 MIL/uL — AB (ref 3.80–5.20)
RDW: 13.5 % (ref 11.5–14.5)
WBC: 11.4 10*3/uL — AB (ref 3.6–11.0)

## 2017-11-01 LAB — SALICYLATE LEVEL

## 2017-11-01 LAB — URINALYSIS, COMPLETE (UACMP) WITH MICROSCOPIC
Bacteria, UA: NONE SEEN
Bilirubin Urine: NEGATIVE
GLUCOSE, UA: NEGATIVE mg/dL
KETONES UR: NEGATIVE mg/dL
LEUKOCYTES UA: NEGATIVE
Nitrite: NEGATIVE
Protein, ur: NEGATIVE mg/dL
RBC / HPF: >50 RBC/hpf — ABNORMAL HIGH (ref 0–5)
SQUAMOUS EPITHELIAL / LPF: NONE SEEN (ref 0–5)
Specific Gravity, Urine: 1.011 (ref 1.005–1.030)
pH: 8 (ref 5.0–8.0)

## 2017-11-01 LAB — LACTIC ACID, PLASMA: Lactic Acid, Venous: 13.2 mmol/L (ref 0.5–1.9)

## 2017-11-01 LAB — TROPONIN I

## 2017-11-01 LAB — ETHANOL

## 2017-11-01 LAB — ACETAMINOPHEN LEVEL

## 2017-11-01 MED ORDER — LORAZEPAM 2 MG/ML IJ SOLN
1.0000 mg | Freq: Once | INTRAMUSCULAR | Status: AC
  Administered 2017-11-01: 1 mg via INTRAVENOUS

## 2017-11-01 MED ORDER — LORAZEPAM 2 MG/ML IJ SOLN
INTRAMUSCULAR | Status: AC
  Filled 2017-11-01: qty 1

## 2017-11-01 MED ORDER — POTASSIUM CHLORIDE CRYS ER 20 MEQ PO TBCR
40.0000 meq | EXTENDED_RELEASE_TABLET | Freq: Once | ORAL | Status: AC
  Administered 2017-11-01: 40 meq via ORAL
  Filled 2017-11-01: qty 2

## 2017-11-01 NOTE — ED Notes (Signed)
Given water ok per dr Reita Cliche

## 2017-11-01 NOTE — ED Provider Notes (Signed)
[facility] Emergency Department Provider Note ____________________________________________   I have reviewed the triage vital signs and the triage nursing note.  HISTORY  Chief Complaint Altered Mental Status   Historian Level 5 Caveat History Limited by altered mental status  HPI Kayla Shah is a 62 y.o. female came in by EMS from home after family called that patient had altered mental status this morning and was hallucinating.  EMS reports house smelled like marijuana.  EMS was able to get history from patient that she had a history of seizures, although it is unclear whether not she is on any medication for that.  Upon arrival to the emergency department patient did have a 30 second self-limited generalized seizure.  She is postictal on my initial evaluation.    Past Medical History:  Diagnosis Date  . Allergy   . Anxiety    agoraphobia   . Arthritis   . Chicken pox   . Depression   . GERD (gastroesophageal reflux disease)   . Headache   . History of blood transfusion    1984  . Hyperlipidemia   . Hypertension   . Migraine   . Seizure [facility])     Patient Active Problem List   Diagnosis Date Noted  . Lung nodule 10/08/2017  . Anxiety and depression 07/30/2017  . Agoraphobia 07/30/2017  . CAD (coronary artery disease) 07/30/2017  . Hot flashes due to surgical menopause 07/30/2017  . Insomnia 07/30/2017  . Osteoporosis 02/27/2016  . Personal history of tobacco use, presenting hazards to health 10/23/2015  . Primary osteoarthritis of left elbow 10/11/2015  . Migraine 06/07/2015  . Seizure ([place]) 06/07/2015  . Smoker 06/07/2015  . Gastroesophageal reflux disease without esophagitis 06/07/2015  . Chronic obstructive bronchitis ([place]) 08/14/2014  . Essential (primary) hypertension 08/14/2014  . Pure hypercholesterolemia 08/14/2014  . Abnormal brain scan 08/29/2013  . Clinical depression 08/29/2013    Past Surgical History:   Procedure Laterality Date  . ABDOMINAL HYSTERECTOMY  1984   endometriosis  . COLONOSCOPY WITH PROPOFOL N/A 03/26/2017   Procedure: COLONOSCOPY WITH PROPOFOL;  Surgeon: Jonathon Bellows, MD;  Location: [facility] ENDOSCOPY;  Service: Gastroenterology;  Laterality: N/A;  . WISDOM TOOTH EXTRACTION      Prior to Admission medications   Medication Sig Start Date End Date Taking? Authorizing Provider  aspirin EC 81 MG tablet Take 81 mg by mouth daily.   Yes [provider]  ALPRAZolam Duanne Moron) 0.5 MG tablet Take 1 tablet (0.5 mg total) by mouth at bedtime as needed for anxiety. 07/30/17   McLean-Scocuzza, Nino Glow, MD  butalbital-acetaminophen-caffeine (FIORICET, ESGIC) [phone] MG tablet Take 1-2 tablets by mouth 3 (three) times daily as needed for headache. 10/30/17 10/30/18  McLean-Scocuzza, Nino Glow, MD  Calcium-Magnesium-Vitamin D (CALCIUM 500 PO) Take by mouth.    [provider]  cholecalciferol (VITAMIN D) 1000 units tablet Take 1,000 Units by mouth daily.    [provider]  dimenhyDRINATE (DRAMAMINE) 50 MG tablet Take 50 mg by mouth daily.    [provider]  gabapentin (NEURONTIN) 300 MG capsule Take 1 capsule (300 mg total) by mouth 2 (two) times daily. 10/27/17   McLean-Scocuzza, Nino Glow, MD  nortriptyline (PAMELOR) 10 MG capsule 1 pill at night x 1 week, may increase to 2 pills at night week 2 10/08/17   McLean-Scocuzza, Nino Glow, MD  pantoprazole (PROTONIX) 40 MG tablet Take 1 tablet (40 mg total) by mouth daily. 06/07/15   Luciana Axe, NP  simvastatin (ZOCOR) 20 MG tablet Take 1 tablet (20 mg total) by mouth daily at 6 PM. 06/07/15   Krebs, Genevie Cheshire, NP  SUMAtriptan (IMITREX) 50 MG tablet Take 1 tablet (50 mg total) by mouth every 6 (six) hours as needed for migraine. May repeat in 2 hours if headache persists or recurs no more than 4 in 1 day no more than 2x per week 10/08/17   McLean-Scocuzza, Nino Glow, MD  tiZANidine (ZANAFLEX) 4 MG capsule Take 4 mg by mouth 2  (two) times daily.    [provider]  verapamil (CALAN-SR) 240 MG CR tablet Take 1 tablet (240 mg total) by mouth daily. 10/08/17 01/06/18  McLean-Scocuzza, Nino Glow, MD    Allergies  Allergen Reactions  . Paroxetine Hcl Other (See Comments)    Pt. Becomes very aggressive with mood swings.  . Codeine Nausea And Vomiting  . Fluoxetine Anxiety  . Paroxetine Anxiety    Family History  Problem Relation Age of Onset  . Stroke Mother   . Depression Mother   . Arthritis Mother   . Heart disease Father   . Alcohol abuse Father   . Diabetes Father   . Osteoporosis Sister   . Kidney disease Sister   . Arthritis Sister   . Arthritis Maternal Grandmother   . Heart disease Maternal Grandmother   . Hyperlipidemia Maternal Grandmother   . Diabetes Paternal Grandmother   . Breast cancer Neg Hx     Social History Social History   Tobacco Use  . Smoking status: Current Every Day Smoker    Packs/day: 1.00    Years: 45.00    Pack years: 45.00    Types: Cigarettes  . Smokeless tobacco: Never Used  Substance Use Topics  . Alcohol use: No  . Drug use: Yes    Types: Marijuana    Review of Systems  Unable to obtain secondary to altered mental status/postictal  ____________________________________________   PHYSICAL EXAM:  VITAL SIGNS: ED Triage Vitals  Enc Vitals Group     BP      Pulse      Resp      Temp      Temp src      SpO2      Weight      Height      Head Circumference      Peak Flow      Pain Score      Pain Loc      Pain Edu?      Excl. in [place]?      Constitutional: Opens eyes to name, confused, not making sense verbally HEENT      Head: Normocephalic and atraumatic.      Eyes: Conjunctivae are normal. Pupils equal and round, approx 28mm bilat.      Ears:         Nose: No congestion/rhinnorhea.      Mouth/Throat: Mucous membranes are moist.      Neck: No stridor. Cardiovascular/Chest: Normal rate, regular rhythm.  No murmurs, rubs, or  gallops. Respiratory: Normal respiratory effort without tachypnea nor retractions. Breath sounds are clear and equal bilaterally. No wheezes/rales/rhonchi. Gastrointestinal: Soft. No distention, no guarding, no rebound. Nontender.  Genitourinary/rectal:Deferred Musculoskeletal: Nontender with normal range of motion in all extremities. Moving 4 extremities Neurologic: No facial droop.  Confused.  Moving 4 extremities.  Unable to cooperate with additional neurologic exam. Skin:  Skin is warm, dry and intact. No rash noted. Psychiatric: Agitated and confused.  ____________________________________________  LABS (pertinent positives/negatives) I, Lisa Roca, MD the attending physician have reviewed the labs noted below.  Labs Reviewed  URINALYSIS, COMPLETE (UACMP) WITH MICROSCOPIC - Abnormal; Notable for the following components:      Result Value   Color, Urine STRAW (*)    APPearance CLEAR (*)    Hgb urine dipstick SMALL (*)    RBC / HPF >50 (*)    All other components within normal limits  URINE DRUG SCREEN, QUALITATIVE ([facility] ONLY) - Abnormal; Notable for the following components:   Cannabinoid 50 Ng, Ur [place] POSITIVE (*)    All other components within normal limits  COMPREHENSIVE METABOLIC PANEL - Abnormal; Notable for the following components:   Potassium 3.0 (*)    CO2 16 (*)    Glucose, Bld 160 (*)    Anion gap 21 (*)    All other components within normal limits  ACETAMINOPHEN LEVEL - Abnormal; Notable for the following components:   Acetaminophen (Tylenol), Serum <10 (*)    All other components within normal limits  LACTIC ACID, PLASMA - Abnormal; Notable for the following components:   Lactic Acid, Venous 13.2 (*)    All other components within normal limits  CBC WITH DIFFERENTIAL/PLATELET - Abnormal; Notable for the following components:   WBC 11.4 (*)    RBC 3.78 (*)    MCV 103.5 (*)    MCH 35.5 (*)    Neutro Abs 8.8 (*)    All other components within normal limits   ETHANOL  SALICYLATE LEVEL  TROPONIN I    ____________________________________________    EKG I, Lisa Roca, MD, the attending physician have personally viewed and interpreted all ECGs.  None ____________________________________________  RADIOLOGY   Chest x-ray read report: No edema or consolidation. __________________________________________  PROCEDURES  Procedure(s) performed: None  Procedures  Critical Care performed: CRITICAL CARE Performed by: Lisa Roca   Total critical care time: 30 minutes  Critical care time was exclusive of separately billable procedures and treating other patients.  Critical care was necessary to treat or prevent imminent or life-threatening deterioration.  Critical care was time spent personally by me on the following activities: development of treatment plan with patient and/or surrogate as well as nursing, discussions with consultants, evaluation of patient's response to treatment, examination of patient, obtaining history from patient or surrogate, ordering and performing treatments and interventions, ordering and review of laboratory studies, ordering and review of radiographic studies, pulse oximetry and re-evaluation of patient's condition.    ____________________________________________  ED COURSE / ASSESSMENT AND PLAN  Pertinent labs & imaging results that were available during my care of the patient were reviewed by me and considered in my medical decision making (see chart for details).     Patient arrived with altered mental status, appearing to be postictal, opening eyes to voice but trying to grab a oxygen mask and having trouble following commands.  Husband did arrive later and was able to add to the history that this morning she seemed to be confused and panicky and at one point she laid down in the bed and he felt like she was then unable to respond to him and seemed like the right side of her face was twitching or  drawing up which only lasted a second or so and he called EMS.  With EMS she did have a witnessed generalized seizure which was self-limited, patient was given IV Ativan on arrival.  She has followed with neurology mostly for migraine headaches, but sounds  like she had a seizure approximately 3 to 4 years ago they cannot quite remember, but the work-up was negative and she was not started on medication.  Denies other drug use.  Urine drug screen is negative for anything besides cannabinoids.  The patient is noted to have a lactate>4. With the current information available to me, I don't think the patient is in septic shock. The lactate>4, is related to seizure activity.  Patient back to baseline and we discussed I do not have a concern for intracranial emergency and discussed holding off brain imaging and they feel quite comfortable with this.  I do not suspect a stroke or TIA.   Mild hypokalemia, given repletion here.    CONSULTATIONS:   Dr. Doy Mince, neurology, by phone discussed given last seizure was multiple years ago, the extremely infrequent nature of this, not recommending starting on medications for seizures now, but close follow-up with neurologist.   Patient / Family / Caregiver informed of clinical course, medical decision-making process, and agree with plan.   I discussed return precautions, follow-up instructions, and discharge instructions with patient and/or family.  Discharge Instructions : You were evaluated after seizure, and your exam and evaluation are overall reassuring in the emergency department today.  Return to the emergency department immediately for any worsening condition including altered mental status, seizure within the next 24 hours, fever, weakness, numbness, or any other symptoms concerning to you.    ___________________________________________   FINAL CLINICAL IMPRESSION(S) / ED DIAGNOSES   Final diagnoses:  Seizure ([place])  Marijuana use   Hypokalemia      ___________________________________________         Note: This dictation was prepared with Dragon dictation. Any transcriptional errors that result from this process are unintentional    Lisa Roca, MD 11/01/17 [phone]

## 2017-11-01 NOTE — ED Notes (Signed)
Upon assessment Pt slow to respond to verbal questions and did not respond do pronator drift/dorsiflection requests.

## 2017-11-01 NOTE — ED Notes (Signed)
Pt requested water. Approved by EDP and water provided

## 2017-11-01 NOTE — ED Triage Notes (Signed)
Pt was brought in via [facility] with c/o AMS. Per EMS, pt had seizure in the bay prior to entrance into the ED. EMS reports pt's BP 128/76, HR 92, and CBG 108. Pt is not oriented at this time.

## 2017-11-01 NOTE — Discharge Instructions (Addendum)
You were evaluated after seizure, and your exam and evaluation are overall reassuring in the emergency department today.  Return to the emergency department immediately for any worsening condition including altered mental status, seizure within the next 24 hours, fever, weakness, numbness, or any other symptoms concerning to you.

## 2017-11-12 ENCOUNTER — Ambulatory Visit: Payer: Medicare Other | Admitting: Psychology

## 2017-11-12 ENCOUNTER — Telehealth: Payer: Self-pay | Admitting: Internal Medicine

## 2017-11-12 ENCOUNTER — Encounter: Payer: Self-pay | Admitting: Internal Medicine

## 2017-11-12 ENCOUNTER — Ambulatory Visit: Payer: Medicare Other | Admitting: Internal Medicine

## 2017-11-12 VITALS — BP 122/78 | HR 80 | Temp 98.1°F | Ht 65.0 in | Wt 114.0 lb

## 2017-11-12 DIAGNOSIS — G43909 Migraine, unspecified, not intractable, without status migrainosus: Secondary | ICD-10-CM

## 2017-11-12 DIAGNOSIS — F419 Anxiety disorder, unspecified: Secondary | ICD-10-CM | POA: Diagnosis not present

## 2017-11-12 DIAGNOSIS — F32A Depression, unspecified: Secondary | ICD-10-CM

## 2017-11-12 DIAGNOSIS — R569 Unspecified convulsions: Secondary | ICD-10-CM | POA: Diagnosis not present

## 2017-11-12 DIAGNOSIS — F329 Major depressive disorder, single episode, unspecified: Secondary | ICD-10-CM

## 2017-11-12 DIAGNOSIS — S2002XD Contusion of left breast, subsequent encounter: Secondary | ICD-10-CM

## 2017-11-12 MED ORDER — OXYCODONE-ACETAMINOPHEN 5-325 MG PO TABS: 1.0000 | ORAL_TABLET | Freq: Two times a day (BID) | ORAL | 0 refills | Status: DC | PRN

## 2017-11-12 NOTE — Telephone Encounter (Signed)
Copied from [place] [id number]. Topic: General - Other >> Nov 12, 2017  2:30 PM Lennox Solders wrote: Reason for CRM: pt was seen today for follow up on  seizures. Pt forgot to tell  doctor she has a knot on her stomach above her belly button that has been there for more than 6 months. The knot is painless. Pt advise. Pt is not due to see md for about 4 month. Pt wants to know should she wait. Pt has not made follow up

## 2017-11-12 NOTE — Patient Instructions (Addendum)
[facility]  [address] [place]  [phone]    Epilepsy Epilepsy is a condition in which a person has repeated seizures over time. A seizure is a sudden burst of abnormal electrical and chemical activity in the brain. Seizures can cause a change in attention, behavior, or the ability to remain awake and alert (altered mental status). Epilepsy increases a person's risk of falls, accidents, and injury. It can also lead to complications, including:  Depression.  Poor memory.  Sudden unexplained death in epilepsy (SUDEP). This complication is rare, and its cause is not known.  Most people with epilepsy lead normal lives. What are the causes? This condition may be caused by:  A head injury.  An injury that happens at birth.  A high fever during childhood.  A stroke.  Bleeding that goes into or around the brain.  Certain medicines and drugs.  Having too little oxygen for a long period of time.  Abnormal brain development.  Certain infections, such as meningitis and encephalitis.  Brain tumors.  Conditions that are passed along from parent to child (are hereditary).  What are the signs or symptoms? Symptoms of a seizure vary greatly from person to person. They include:  Convulsions.  Stiffening of the body.  Involuntary movements of the arms or legs.  Loss of consciousness.  Breathing problems.  Falling suddenly.  Confusion.  Head nodding.  Eye blinking or fluttering.  Lip smacking.  Drooling.  Rapid eye movements.  Grunting.  Loss of bladder control and bowel control.  Staring.  Unresponsiveness.  Some people have symptoms right before a seizure happens (aura) and right after a seizure happens. Symptoms of an aura include:  Fear or anxiety.  Nausea.  Feeling like the room is spinning (vertigo).  A feeling of having seen or heard something before (deja vu).  Odd tastes or smells.  Changes in  vision, such as seeing flashing lights or spots.  Symptoms that follow a seizure include:  Confusion.  Sleepiness.  Headache.  How is this diagnosed? This condition is diagnosed based on:  Your symptoms.  Your medical history.  A physical exam.  A neurological exam. A neurological exam is similar to a physical exam. It involves checking your strength, reflexes, coordination, and sensations.  Tests, such as: ? An electroencephalogram (EEG). This is a painless test that creates a diagram of your brain waves. ? An MRI of the brain. ? A CT scan of the brain. ? A lumbar puncture, also called a spinal tap. ? Blood tests to check for signs of infection or abnormal blood chemistry.  How is this treated? There is no cure for this condition, but treatment can help control seizures. Treatment may involve:  Taking medicines to control seizures. These include medicines to prevent seizures and medicines to stop seizures as they occur.  Having a device called a vagus nerve stimulator implanted in the chest. The device sends electrical impulses to the vagus nerve and to the brain to prevent seizures. This treatment may be recommended if medicines do not help.  Brain surgery. There are several kinds of surgeries that may be done to stop seizures from happening or to reduce how often seizures happen.  Having regular blood tests. You may need to have blood tests regularly to check that you are getting the right amount of medicine.  Once this condition has been diagnosed, it is important to begin treatment as soon as possible. For some people, epilepsy  eventually goes away. Follow these instructions at home: Medicines   Take over-the-counter and prescription medicines only as told by your health care provider.  Avoid any substances that may prevent your medicine from working properly, such as alcohol. Activity  Get enough rest. Lack of sleep can make seizures more likely to  occur.  Follow instructions from your health care provider about driving, swimming, and doing any other activities that would be dangerous if you had a seizure. Educating others Teach friends and family what to do if you have a seizure. They should:  Lay you on the ground to prevent a fall.  Cushion your head and body.  Loosen any tight clothing around your neck.  Turn you on your side. If vomiting occurs, this helps keep your airway clear.  Stay with you until you recover.  Not hold you down. Holding you down will not stop the seizure.  Not put anything in your mouth.  Know whether or not you need emergency care.  General instructions  Avoid anything that has ever triggered a seizure for you.  Keep a seizure diary. Record what you remember about each seizure, especially anything that might have triggered the seizure.  Keep all follow-up visits as told by your health care provider. This is important. Contact a health care provider if:  Your seizure pattern changes.  You have symptoms of infection or another illness. This might increase your risk of having a seizure. Get help right away if:  You have a seizure that does not stop after 5 minutes.  You have several seizures in a row without a complete recovery in between seizures.  You have a seizure that makes it harder to breathe.  You have a seizure that is different from previous seizures.  You have a seizure that leaves you unable to speak or use a part of your body.  You did not wake up immediately after a seizure. This information is not intended to replace advice given to you by your health care provider. Make sure you discuss any questions you have with your health care provider. Document Released: 01/20/2005 Document Revised: 08/18/2015 Document Reviewed: 07/31/2015 Elsevier Interactive Patient Education  2018 [place] A contusion is a deep bruise. Contusions are the result of a blunt injury  to tissues and muscle fibers under the skin. The injury causes bleeding under the skin. The skin overlying the contusion may turn blue, purple, or yellow. Minor injuries will give you a painless contusion, but more severe contusions may stay painful and swollen for a few weeks. What are the causes? This condition is usually caused by a blow, trauma, or direct force to an area of the body. What are the signs or symptoms? Symptoms of this condition include:  Swelling of the injured area.  Pain and tenderness in the injured area.  Discoloration. The area may have redness and then turn blue, purple, or yellow.  How is this diagnosed? This condition is diagnosed based on a physical exam and medical history. An X-ray, CT scan, or MRI may be needed to determine if there are any associated injuries, such as broken bones (fractures). How is this treated? Specific treatment for this condition depends on what area of the body was injured. In general, the best treatment for a contusion is resting, icing, applying pressure to (compression), and elevating the injured area. This is often called the RICE strategy. Over-the-counter anti-inflammatory medicines may also be recommended for pain control. Follow these instructions at  home:  Rest the injured area.  If directed, apply ice to the injured area: ? Put ice in a plastic bag. ? Place a towel between your skin and the bag. ? Leave the ice on for 20 minutes, 2-3 times per day.  If directed, apply light compression to the injured area using an elastic bandage. Make sure the bandage is not wrapped too tightly. Remove and reapply the bandage as directed by your health care provider.  If possible, raise (elevate) the injured area above the level of your heart while you are sitting or lying down.  Take over-the-counter and prescription medicines only as told by your health care provider. Contact a health care provider if:  Your symptoms do not improve  after several days of treatment.  Your symptoms get worse.  You have difficulty moving the injured area. Get help right away if:  You have severe pain.  You have numbness in a hand or foot.  Your hand or foot turns pale or cold. This information is not intended to replace advice given to you by your health care provider. Make sure you discuss any questions you have with your health care provider. Document Released: 10/30/2004 Document Revised: 05/31/2015 Document Reviewed: 06/07/2014 Elsevier Interactive Patient Education  [employer].

## 2017-11-12 NOTE — Progress Notes (Signed)
Chief Complaint  Patient presents with  . Follow-up   F/u  1. Had seizure 11/01/17 with postictal and LOC h/o seizure d/o was on Keppra she bruised left chest and c/o pain CXR 11/01/17 negative UDS+ THC she has tried ice and heat to left chest NSAIDS and tylenol w/o relief  2. Migraines on imitrex, prn fiorcet, prn imitrex and nortriptyline still getting migraines  3. Anxiety unable to see psychiatry for now due to finances I advised I will no longer continue xanax prn we can try other options pt declines  Therapy here is helping mood but anxiety is not controlled   Review of Systems  Constitutional: Negative for weight loss.  HENT: Negative for hearing loss.   Eyes: Negative for blurred vision.  Respiratory: Negative for shortness of breath.   Cardiovascular: Negative for chest pain.       Bruise to left chest with pain   Skin: Negative for rash.  Neurological: Positive for seizures and headaches.  Psychiatric/Behavioral: Negative for depression. The patient is nervous/anxious.    Past Medical History:  Diagnosis Date  . Allergy   . Anxiety    agoraphobia   . Arthritis   . Chicken pox   . Depression   . GERD (gastroesophageal reflux disease)   . Headache   . History of blood transfusion    1984  . Hyperlipidemia   . Hypertension   . Migraine   . Seizure [facility])    Past Surgical History:  Procedure Laterality Date  . ABDOMINAL HYSTERECTOMY  1984   endometriosis  . COLONOSCOPY WITH PROPOFOL N/A 03/26/2017   Procedure: COLONOSCOPY WITH PROPOFOL;  Surgeon: Jonathon Bellows, MD;  Location: [facility] ENDOSCOPY;  Service: Gastroenterology;  Laterality: N/A;  . WISDOM TOOTH EXTRACTION     Family History  Problem Relation Age of Onset  . Stroke Mother   . Depression Mother   . Arthritis Mother   . Heart disease Father   . Alcohol abuse Father   . Diabetes Father   . Osteoporosis Sister   . Kidney disease Sister   . Arthritis Sister   . Arthritis Maternal Grandmother   . Heart  disease Maternal Grandmother   . Hyperlipidemia Maternal Grandmother   . Diabetes Paternal Grandmother   . Breast cancer Neg Hx    Social History   Socioeconomic History  . Marital status: Divorced    Spouse name: Not on file  . Number of children: Not on file  . Years of education: Not on file  . Highest education level: Not on file  Occupational History  . Not on file  Social Needs  . Financial resource strain: Not on file  . Food insecurity:    Worry: Not on file    Inability: Not on file  . Transportation needs:    Medical: Not on file    Non-medical: Not on file  Tobacco Use  . Smoking status: Current Every Day Smoker    Packs/day: 1.00    Years: 45.00    Pack years: 45.00    Types: Cigarettes  . Smokeless tobacco: Never Used  Substance and Sexual Activity  . Alcohol use: No  . Drug use: Yes    Types: Marijuana  . Sexual activity: Never  Lifestyle  . Physical activity:    Days per week: Not on file    Minutes per session: Not on file  . Stress: Not on file  Relationships  . Social connections:    Talks on phone: Not  on file    Gets together: Not on file    Attends religious service: Not on file    Active member of club or organization: Not on file    Attends meetings of clubs or organizations: Not on file    Relationship status: Not on file  . Intimate partner violence:    Fear of current or ex partner: Not on file    Emotionally abused: Not on file    Physically abused: Not on file    Forced sexual activity: Not on file  Other Topics Concern  . Not on file  Social History Narrative   Disabled    Married no kids    No guns, wears seat belt, feels safe in relationship    12th grade ed.    Current Meds  Medication Sig  . aspirin EC 81 MG tablet Take 81 mg by mouth daily.  . butalbital-acetaminophen-caffeine (FIORICET, ESGIC) 50-325-40 MG tablet Take 1-2 tablets by mouth 3 (three) times daily as needed for headache.  . Calcium-Magnesium-Vitamin D  (CALCIUM 500 PO) Take by mouth.  . cholecalciferol (VITAMIN D) 1000 units tablet Take 1,000 Units by mouth daily.  Marland Kitchen dimenhyDRINATE (DRAMAMINE) 50 MG tablet Take 50 mg by mouth daily.  Marland Kitchen gabapentin (NEURONTIN) 300 MG capsule Take 1 capsule (300 mg total) by mouth 2 (two) times daily.  . nortriptyline (PAMELOR) 10 MG capsule 1 pill at night x 1 week, may increase to 2 pills at night week 2  . pantoprazole (PROTONIX) 40 MG tablet Take 1 tablet (40 mg total) by mouth daily.  . simvastatin (ZOCOR) 20 MG tablet Take 1 tablet (20 mg total) by mouth daily at 6 PM.  . SUMAtriptan (IMITREX) 50 MG tablet Take 1 tablet (50 mg total) by mouth every 6 (six) hours as needed for migraine. May repeat in 2 hours if headache persists or recurs no more than 4 in 1 day no more than 2x per week  . verapamil (CALAN-SR) 240 MG CR tablet Take 1 tablet (240 mg total) by mouth daily.   Allergies  Allergen Reactions  . Paroxetine Hcl Other (See Comments)    Pt. Becomes very aggressive with mood swings.  . Codeine Nausea And Vomiting  . Fluoxetine Anxiety  . Paroxetine Anxiety   Recent Results (from the past 2160 hour(s))  Urinalysis, Complete w Microscopic     Status: Abnormal   Collection Time: 11/01/17  7:09 AM  Result Value Ref Range   Color, Urine STRAW (A) YELLOW   APPearance CLEAR (A) CLEAR   Specific Gravity, Urine 1.011 1.005 - 1.030   pH 8.0 5.0 - 8.0   Glucose, UA NEGATIVE NEGATIVE mg/dL   Hgb urine dipstick SMALL (A) NEGATIVE   Bilirubin Urine NEGATIVE NEGATIVE   Ketones, ur NEGATIVE NEGATIVE mg/dL   Protein, ur NEGATIVE NEGATIVE mg/dL   Nitrite NEGATIVE NEGATIVE   Leukocytes, UA NEGATIVE NEGATIVE   RBC / HPF >50 (H) 0 - 5 RBC/hpf   WBC, UA 0-5 0 - 5 WBC/hpf   Bacteria, UA NONE SEEN NONE SEEN   Squamous Epithelial / LPF NONE SEEN 0 - 5    Comment: Performed at [facility], [address] [place], [place] [address]  Urine Drug Screen, Qualitative     Status: Abnormal   Collection  Time: 11/01/17  7:09 AM  Result Value Ref Range   Tricyclic, Ur Screen NONE DETECTED NONE DETECTED   Amphetamines, Ur Screen NONE DETECTED NONE DETECTED   MDMA (Ecstasy)Ur  Screen NONE DETECTED NONE DETECTED   Cocaine Metabolite,Ur [place] NONE DETECTED NONE DETECTED   Opiate, Ur Screen NONE DETECTED NONE DETECTED   Phencyclidine (PCP) Ur S NONE DETECTED NONE DETECTED   Cannabinoid 50 Ng, Ur [place] POSITIVE (A) NONE DETECTED   Barbiturates, Ur Screen NONE DETECTED NONE DETECTED   Benzodiazepine, Ur Scrn NONE DETECTED NONE DETECTED   Methadone Scn, Ur NONE DETECTED NONE DETECTED    Comment: (NOTE) Tricyclics + metabolites, urine    Cutoff 1000 ng/mL Amphetamines + metabolites, urine  Cutoff 1000 ng/mL MDMA (Ecstasy), urine              Cutoff 500 ng/mL Cocaine Metabolite, urine          Cutoff 300 ng/mL Opiate + metabolites, urine        Cutoff 300 ng/mL Phencyclidine (PCP), urine         Cutoff 25 ng/mL Cannabinoid, urine                 Cutoff 50 ng/mL Barbiturates + metabolites, urine  Cutoff 200 ng/mL Benzodiazepine, urine              Cutoff 200 ng/mL Methadone, urine                   Cutoff 300 ng/mL The urine drug screen provides only a preliminary, unconfirmed analytical test result and should not be used for non-medical purposes. Clinical consideration and professional judgment should be applied to any positive drug screen result due to possible interfering substances. A more specific alternate chemical method must be used in order to obtain a confirmed analytical result. Gas chromatography / mass spectrometry (GC/MS) is the preferred confirmat ory method. Performed at [facility], [address] [place], [place] [address]   Comprehensive metabolic panel     Status: Abnormal   Collection Time: 11/01/17  7:09 AM  Result Value Ref Range   Sodium 140 135 - 145 mmol/L    Comment: RESULTS VERIFIED BY REPEAT TESTING /HKP   Potassium 3.0 (L) 3.5 - 5.1 mmol/L   Chloride  103 98 - 111 mmol/L   CO2 16 (L) 22 - 32 mmol/L   Glucose, Bld 160 (H) 70 - 99 mg/dL   BUN 14 8 - 23 mg/dL   Creatinine, Ser 0.87 0.44 - 1.00 mg/dL   Calcium 8.9 8.9 - 10.3 mg/dL   Total Protein 7.7 6.5 - 8.1 g/dL   Albumin 4.3 3.5 - 5.0 g/dL   AST 26 15 - 41 U/L   ALT 8 0 - 44 U/L   Alkaline Phosphatase 49 38 - 126 U/L   Total Bilirubin 0.6 0.3 - 1.2 mg/dL   GFR calc non Af Amer >60 >60 mL/min   GFR calc Af Amer >60 >60 mL/min    Comment: (NOTE) The eGFR has been calculated using the CKD EPI equation. This calculation has not been validated in all clinical situations. eGFR's persistently <60 mL/min signify possible Chronic Kidney Disease.    Anion gap 21 (H) 5 - 15    Comment: Performed at [facility], [address] [place], [place] [address]  Ethanol     Status: None   Collection Time: 11/01/17  7:09 AM  Result Value Ref Range   Alcohol, Ethyl (B) <10 <10 mg/dL    Comment: (NOTE) Lowest detectable limit for serum alcohol is 10 mg/dL. For medical purposes only. Performed at [facility], [address], [place], [place] [address]  Salicylate level     Status: None   Collection Time: 11/01/17  7:09 AM  Result Value Ref Range   Salicylate Lvl <6.2 2.8 - 30.0 mg/dL    Comment: Performed at [facility], [address] [place], [place] [address]  Acetaminophen level     Status: Abnormal   Collection Time: 11/01/17  7:09 AM  Result Value Ref Range   Acetaminophen (Tylenol), Serum <10 (L) 10 - 30 ug/mL    Comment: (NOTE) Therapeutic concentrations vary significantly. A range of 10-30 ug/mL  may be an effective concentration for many patients. However, some  are best treated at concentrations outside of this range. Acetaminophen concentrations >150 ug/mL at 4 hours after ingestion  and >50 ug/mL at 12 hours after ingestion are often associated with  toxic reactions. Performed at [facility], [address] [place], [place]  [address]   Lactic acid, plasma     Status: Abnormal   Collection Time: 11/01/17  7:09 AM  Result Value Ref Range   Lactic Acid, Venous 13.2 (HH) 0.5 - 1.9 mmol/L    Comment: CRITICAL RESULT CALLED TO, READ BACK BY AND VERIFIED WITH ALLEY CHANDLER_0  ON 11/01/17 BY HKP Performed at [facility], [address] [place], [place] [address]   CBC with Differential     Status: Abnormal   Collection Time: 11/01/17  7:09 AM  Result Value Ref Range   WBC 11.4 (H) 3.6 - 11.0 K/uL   RBC 3.78 (L) 3.80 - 5.20 MIL/uL   Hemoglobin 13.4 12.0 - 16.0 g/dL   HCT 39.1 35.0 - 47.0 %   MCV 103.5 (H) 80.0 - 100.0 fL   MCH 35.5 (H) 26.0 - 34.0 pg   MCHC 34.3 32.0 - 36.0 g/dL   RDW 13.5 11.5 - 14.5 %   Platelets 193 150 - 440 K/uL   Neutrophils Relative % 76 %   Neutro Abs 8.8 (H) 1.4 - 6.5 K/uL   Lymphocytes Relative 18 %   Lymphs Abs 2.1 1.0 - 3.6 K/uL   Monocytes Relative 4 %   Monocytes Absolute 0.4 0.2 - 0.9 K/uL   Eosinophils Relative 1 %   Eosinophils Absolute 0.1 0 - 0.7 K/uL   Basophils Relative 1 %   Basophils Absolute 0.1 0 - 0.1 K/uL    Comment: Performed at [facility], [address] [place], [place] [address]  Troponin I     Status: None   Collection Time: 11/01/17  7:09 AM  Result Value Ref Range   Troponin I <0.03 <0.03 ng/mL    Comment: Performed at [facility], [address] [place],  [address]   Objective  Body mass index is 18.97 kg/m. Wt Readings from Last 3 Encounters:  11/12/17 114 lb (51.7 kg)  11/01/17 112 lb (50.8 kg)  10/28/17 112 lb (50.8 kg)   Temp Readings from Last 3 Encounters:  11/12/17 98.1 F (36.7 C) (Oral)  11/01/17 97.9 F (36.6 C) (Axillary)  10/09/17 98.2 F (36.8 C) (Oral)   BP Readings from Last 3 Encounters:  11/12/17 122/78  11/01/17 130/82  10/10/17 (!) 146/86   Pulse Readings from Last 3 Encounters:  11/12/17 80  11/01/17 96  10/10/17 70    Physical Exam  Constitutional: She is oriented  to person, place, and time. Vital signs are normal. She appears well-developed and well-nourished. She is cooperative.  HENT:  Head: Normocephalic and atraumatic.  Mouth/Throat: Oropharynx is clear and moist and mucous membranes  are normal.  Eyes: Pupils are equal, round, and reactive to light. Conjunctivae are normal.  Cardiovascular: Normal rate, regular rhythm and normal heart sounds.  Pulmonary/Chest: Effort normal and breath sounds normal.    Neurological: She is alert and oriented to person, place, and time. Gait normal.  Skin: Skin is warm and dry. Bruising noted.     Psychiatric: She has a normal mood and affect. Her speech is normal and behavior is normal. Judgment and thought content normal. Cognition and memory are normal.  Nursing note and vitals reviewed.   Assessment   1. Migraines and seizures ? Etiology  2. Anxiety  3. Left chest contusion normal ROM left shoulder 4. HM Plan   1. Refer to [place] neurology further w/u  MRI brain On nortriptyline 10 mg qhs, prn imitrex and fiorocet disc migraines with neurology   2. Will not do chronic benzos establish with psych in future does not want to try other options  Cont therapy here 3. Prn percocet, heat and ic e 4.  Declines flu shot, pna vaccines, shingles-reassess at f/u  ? Last Tdap -no Tdap in [facility]  S/p hysterectomy 1984 endometriosis no h/o abnormal pap ask at f/u if cervix still intact  Mammogram 03/11/17 normal  Colonoscopy 03/26/17 polyps tubular and hyperplastic Dr. Vicente Males neg malignancy repeat in 5 years  dexa 02/27/16 osteoporosis oral meds made her nauseated  -will try to get prolia approved approved pt cant afford 2/2 cost  -vit D 47.4 06/22/15  CT chest had 10/28/17 needs yearly teenager started smoking max 1-2 ppd now <1 ppd no FH lung cancer Refer dermatology tbse Dr. Brendolyn Patty will call sch appt when cant afford right now  Declines STD check   Try to get copy of HIV, hep B/C labs from [place]  Most recent pcp Dr. Cletis [place] 2019   Provider: Dr. Olivia Mackie McLean-Scocuzza-Internal Medicine

## 2017-11-12 NOTE — Telephone Encounter (Signed)
Please advise 

## 2017-11-12 NOTE — Telephone Encounter (Signed)
We could image her abdomen and do a CT scan to work up for a hernia Does she want to do that?   [place]

## 2017-11-12 NOTE — Progress Notes (Signed)
Pre visit review using our clinic review tool, if applicable. No additional management support is needed unless otherwise documented below in the visit note. 

## 2017-11-16 ENCOUNTER — Telehealth: Payer: Self-pay | Admitting: *Deleted

## 2017-11-16 NOTE — Telephone Encounter (Signed)
Copied from [place] [id number]. Topic: Quick Communication - Rx Refill/Question >> Nov 16, 2017  4:10 PM Carolyn Stare wrote: Medication   oxyCODONE-acetaminophen (PERCOCET) 5-325 MG tablet  Has the patient contacted their pharmacy  yes    Preferred [place]: Please be advised that RX refills may take up to 3 business days. We ask that you follow-up with your pharmacy.

## 2017-11-16 NOTE — Telephone Encounter (Signed)
Will not be refilling this for her advised her at her appt this was a 5 day supply only  She can take otc medications for pain tylenol or Advil  [place]

## 2017-11-17 NOTE — Telephone Encounter (Signed)
Pt notified & would like to think about it and call back later when she gets home.

## 2017-11-17 NOTE — Telephone Encounter (Signed)
Patient apologized says since starting to have seizures she forgets a lot. She did not mean to bother PCP.

## 2017-11-18 ENCOUNTER — Encounter: Payer: Self-pay | Admitting: Diagnostic Neuroimaging

## 2017-11-18 ENCOUNTER — Ambulatory Visit: Payer: Medicare Other | Admitting: Diagnostic Neuroimaging

## 2017-11-18 VITALS — BP 139/82 | HR 73 | Ht 65.0 in | Wt 112.6 lb

## 2017-11-18 DIAGNOSIS — F332 Major depressive disorder, recurrent severe without psychotic features: Secondary | ICD-10-CM

## 2017-11-18 DIAGNOSIS — G43009 Migraine without aura, not intractable, without status migrainosus: Secondary | ICD-10-CM | POA: Diagnosis not present

## 2017-11-18 DIAGNOSIS — G40909 Epilepsy, unspecified, not intractable, without status epilepticus: Secondary | ICD-10-CM

## 2017-11-18 MED ORDER — DIVALPROEX SODIUM 500 MG PO DR TAB: 500.0000 mg | DELAYED_RELEASE_TABLET | Freq: Two times a day (BID) | ORAL | 9 refills | Status: DC

## 2017-11-18 MED ORDER — RIZATRIPTAN BENZOATE 10 MG PO TBDP: 10.0000 mg | ORAL_TABLET | ORAL | 11 refills | Status: DC | PRN

## 2017-11-18 NOTE — Telephone Encounter (Signed)
Pt states she would like to re-visit this issue at a later time.

## 2017-11-18 NOTE — Progress Notes (Signed)
[facility]  PATIENT: Kayla Shah DOB: 1955-04-04  REFERRING CLINICIAN:  T Mclean-Scocuzza HISTORY FROM: patient  REASON FOR VISIT: new consult    HISTORICAL  CHIEF COMPLAINT:  Chief Complaint  Patient presents with  . Seizures    rm 7, New Pt, "history of seizures, I used to be on Keppra but my dr told me I didn't have seizures and took me off Keppra"  . Migraine    "Imitrex helps a little"    HISTORY OF PRESENT ILLNESS:   62 year old female here for evaluation of seizure and migraine.  Patient first developed seizures in 5s.  She was on Keppra at that time.  At some point she was taken off of Keppra.  2014 she was hospitalized for seizure.  She may have been on and off of Keppra following 2014 but she is not sure.  Patient reports grand mal seizures and postictal confusion symptoms but is not quite sure about the frequency.  She had another seizure possibly in September 2019.  She is not on antiseizure medication at this time.  Patient has history of migraine headaches at age 89 years old with pounding throbbing headaches, nausea, photophobia, typically unilateral right greater than left-sided headaches.  She averages 4-5 headaches per month.  She is not sure what type of medication she has been on the past.  Patient also has history of depression, anxiety, stress, weight loss, chronic pain.   REVIEW OF SYSTEMS: Full 14 system review of systems performed and negative with exception of: Memory loss confusion headache cold urination problems seizure disinterest activities.  ALLERGIES: Allergies  Allergen Reactions  . Paroxetine Hcl Other (See Comments)    Pt. Becomes very aggressive with mood swings.  . Codeine Nausea And Vomiting  . Fluoxetine Anxiety  . Paroxetine Anxiety    HOME MEDICATIONS: Outpatient Medications Prior to Visit  Medication Sig Dispense Refill  . aspirin EC 81 MG tablet Take 81 mg by mouth daily.    .  butalbital-acetaminophen-caffeine (FIORICET, ESGIC) 50-325-40 MG tablet Take 1-2 tablets by mouth 3 (three) times daily as needed for headache. 90 tablet 1  . Calcium-Magnesium-Vitamin D (CALCIUM 500 PO) Take by mouth.    . cholecalciferol (VITAMIN D) 1000 units tablet Take 1,000 Units by mouth daily.    Marland Kitchen dimenhyDRINATE (DRAMAMINE) 50 MG tablet Take 50 mg by mouth daily.    Marland Kitchen gabapentin (NEURONTIN) 300 MG capsule Take 1 capsule (300 mg total) by mouth 2 (two) times daily. 60 capsule 11  . nortriptyline (PAMELOR) 10 MG capsule 1 pill at night x 1 week, may increase to 2 pills at night week 2 60 capsule 2  . pantoprazole (PROTONIX) 40 MG tablet Take 1 tablet (40 mg total) by mouth daily. 90 tablet 3  . simvastatin (ZOCOR) 20 MG tablet Take 1 tablet (20 mg total) by mouth daily at 6 PM. 90 tablet 3  . verapamil (CALAN-SR) 240 MG CR tablet Take 1 tablet (240 mg total) by mouth daily. 90 tablet 3  . oxyCODONE-acetaminophen (PERCOCET) 5-325 MG tablet Take 1 tablet by mouth 2 (two) times daily as needed for severe pain. (Patient not taking: Reported on 11/18/2017) 10 tablet 0  . SUMAtriptan (IMITREX) 50 MG tablet Take 1 tablet (50 mg total) by mouth every 6 (six) hours as needed for migraine. May repeat in 2 hours if headache persists or recurs no more than 4 in 1 day no more than 2x per week (Patient not taking: Reported on 11/18/2017)  10 tablet 2   No facility-administered medications prior to visit.     PAST MEDICAL HISTORY: Past Medical History:  Diagnosis Date  . Allergy   . Anxiety    agoraphobia   . Arthritis   . Chicken pox   . COPD (chronic obstructive pulmonary disease) ([place])   . Depression   . GERD (gastroesophageal reflux disease)   . Headache   . History of blood transfusion    1984  . Hyperlipidemia   . Hypertension   . Migraine   . Seizure ([place])    was on keppra    PAST SURGICAL HISTORY: Past Surgical History:  Procedure Laterality Date  . ABDOMINAL HYSTERECTOMY  1984     endometriosis  . COLONOSCOPY WITH PROPOFOL N/A 03/26/2017   Procedure: COLONOSCOPY WITH PROPOFOL;  Surgeon: Jonathon Bellows, MD;  Location: [facility] ENDOSCOPY;  Service: Gastroenterology;  Laterality: N/A;  . WISDOM TOOTH EXTRACTION      FAMILY HISTORY: Family History  Problem Relation Age of Onset  . Stroke Mother   . Depression Mother   . Arthritis Mother   . Heart disease Father   . Alcohol abuse Father   . Diabetes Father   . Osteoporosis Sister   . Kidney disease Sister   . Arthritis Sister   . Arthritis Maternal Grandmother   . Heart disease Maternal Grandmother   . Hyperlipidemia Maternal Grandmother   . Diabetes Paternal Grandmother   . Breast cancer Neg Hx     SOCIAL HISTORY: Social History   Socioeconomic History  . Marital status: Divorced    Spouse name: Legrand Como  . Number of children: 0  . Years of education: 15  . Highest education level: Not on file  Occupational History    Comment: disabled  Social Needs  . Financial resource strain: Not on file  . Food insecurity:    Worry: Not on file    Inability: Not on file  . Transportation needs:    Medical: Not on file    Non-medical: Not on file  Tobacco Use  . Smoking status: Current Every Day Smoker    Packs/day: 1.00    Years: 45.00    Pack years: 45.00    Types: Cigarettes  . Smokeless tobacco: Never Used  Substance and Sexual Activity  . Alcohol use: No  . Drug use: Yes    Types: Marijuana    Comment: 11/18/17 "NA"  . Sexual activity: Never  Lifestyle  . Physical activity:    Days per week: Not on file    Minutes per session: Not on file  . Stress: Not on file  Relationships  . Social connections:    Talks on phone: Not on file    Gets together: Not on file    Attends religious service: Not on file    Active member of club or organization: Not on file    Attends meetings of clubs or organizations: Not on file    Relationship status: Not on file  . Intimate partner violence:    Fear of  current or ex partner: Not on file    Emotionally abused: Not on file    Physically abused: Not on file    Forced sexual activity: Not on file  Other Topics Concern  . Not on file  Social History Narrative   Disabled    Married no kids    No guns, wears seat belt, feels safe in relationship    12th grade ed.  PHYSICAL EXAM  GENERAL EXAM/CONSTITUTIONAL: Vitals:  Vitals:   11/18/17 1403  BP: 139/82  Pulse: 73  Weight: 112 lb 9.6 oz (51.1 kg)  Height: 5\' 5"  (1.651 m)     Body mass index is 18.74 kg/m. Wt Readings from Last 3 Encounters:  11/18/17 112 lb 9.6 oz (51.1 kg)  11/12/17 114 lb (51.7 kg)  11/01/17 112 lb (50.8 kg)     SLOW MOVEMENTS; MALAISE APPEARANCE  Patient is in no distress; well developed, nourished and groomed; neck is supple  CARDIOVASCULAR:  Examination of carotid arteries is normal; no carotid bruits  Regular rate and rhythm, no murmurs  Examination of peripheral vascular system by observation and palpation is normal  EYES:  Ophthalmoscopic exam of optic discs and posterior segments is normal; no papilledema or hemorrhages Vision Screening Comments: Refused to attempt  MUSCULOSKELETAL:  Gait, strength, tone, movements noted in Neurologic exam below  NEUROLOGIC: MENTAL STATUS:  No flowsheet data found.  awake, alert, oriented to person, place and time  recent and remote memory intact  normal attention and concentration  language fluent, comprehension intact, naming intact  fund of knowledge appropriate  CRANIAL NERVE:   2nd - no papilledema on fundoscopic exam  2nd, 3rd, 4th, 6th - pupils equal and reactive to light, visual fields full to confrontation, extraocular muscles intact, no nystagmus  5th - facial sensation symmetric  7th - facial strength symmetric  8th - hearing intact  9th - palate elevates symmetrically, uvula midline  11th - shoulder shrug symmetric  12th - tongue protrusion midline  MOTOR:    normal bulk and tone, full strength in the BUE, BLE  LUE ANTALGIC MOVEMENTS  SENSORY:   normal and symmetric to light touch, temperature, vibration  COORDINATION:   finger-nose-finger, fine finger movements normal  REFLEXES:   deep tendon reflexes present and symmetric  GAIT/STATION:   narrow based gait     DIAGNOSTIC DATA (LABS, IMAGING, TESTING) - I reviewed patient records, labs, notes, testing and imaging myself where available.  Lab Results  Component Value Date   WBC 11.4 (H) 11/01/2017   HGB 13.4 11/01/2017   HCT 39.1 11/01/2017   MCV 103.5 (H) 11/01/2017   PLT 193 11/01/2017      Component Value Date/Time   NA 140 11/01/2017 0709   NA 143 06/22/2015 1053   NA 139 08/28/2013 1805   K 3.0 (L) 11/01/2017 0709   K 3.5 08/28/2013 1805   CL 103 11/01/2017 0709   CL 101 08/28/2013 1805   CO2 16 (L) 11/01/2017 0709   CO2 34 (H) 08/28/2013 1805   GLUCOSE 160 (H) 11/01/2017 0709   GLUCOSE 91 08/28/2013 1805   BUN 14 11/01/2017 0709   BUN 12 06/22/2015 1053   BUN 13 08/28/2013 1805   CREATININE 0.87 11/01/2017 0709   CREATININE 1.27 08/28/2013 1805   CALCIUM 8.9 11/01/2017 0709   CALCIUM 8.8 08/28/2013 1805   PROT 7.7 11/01/2017 0709   PROT 7.6 06/22/2015 1053   PROT 7.8 08/28/2013 1805   ALBUMIN 4.3 11/01/2017 0709   ALBUMIN 4.7 06/22/2015 1053   ALBUMIN 4.2 08/28/2013 1805   AST 26 11/01/2017 0709   AST 23 08/28/2013 1805   ALT 8 11/01/2017 0709   ALT 16 08/28/2013 1805   ALKPHOS 49 11/01/2017 0709   ALKPHOS 64 08/28/2013 1805   BILITOT 0.6 11/01/2017 0709   BILITOT 0.3 06/22/2015 1053   BILITOT 0.3 08/28/2013 1805   GFRNONAA >60 11/01/2017 0709   GFRNONAA  47 (L) 08/28/2013 1805   GFRAA >60 11/01/2017 0709   GFRAA 54 (L) 08/28/2013 1805   Lab Results  Component Value Date   CHOL 142 08/05/2017   HDL 48.10 08/05/2017   LDLCALC 55 08/05/2017   TRIG 196.0 (H) 08/05/2017   CHOLHDL 3 08/05/2017   Lab Results  Component Value Date    HGBA1C 5.5 08/05/2017   No results found for: VITAMINB12 Lab Results  Component Value Date   TSH 1.81 08/05/2017    10/06/13 MRI brain [I reviewed images myself and agree with interpretation. -VRP]  - Stable and normal for age MRI appearance of the brain.   11/01/17 CXR [I reviewed images myself and agree with interpretation. -VRP]  - Stable and normal for age MRI appearance of the brain.     ASSESSMENT AND PLAN  62 y.o. year old female here with history of seizure disorder since 1990s, also with history of migraine headaches.  Also with concomitant severe depression and chronic pain.   Dx:  1. Seizure disorder ([place])   2. Migraine without aura and without status migrainosus, not intractable   3. Severe episode of recurrent major depressive disorder, without psychotic features ([place])     PLAN:  - start depakote 500mg  twice a day (to help with seizure prevention, migraine prevention; also may help with mood stabilization)  - rizatriptan 10mg  as needed for breakthrough headache; may repeat x 1 after 2 hours; max 2 tabs per day or 8 per month  - stop sumatriptan (not effective)  - agree with psychiatry evaluation for depression / anxiety  Meds ordered this encounter  Medications  . divalproex (DEPAKOTE) 500 MG DR tablet    Sig: Take 1 tablet (500 mg total) by mouth 2 (two) times daily.    Dispense:  60 tablet    Refill:  9  . rizatriptan (MAXALT-MLT) 10 MG disintegrating tablet    Sig: Take 1 tablet (10 mg total) by mouth as needed for migraine. May repeat in 2 hours if needed    Dispense:  9 tablet    Refill:  11   Return in about 6 months (around 05/20/2018).  I reviewed images, labs, notes, records myself. I summarized findings and reviewed with patient, for this high risk condition (SEIZURE; migraine; major depression) requiring high complexity decision making.     Penni Bombard, MD 46/56/8127, 5:17 PM Certified in Neurology, Neurophysiology and  Neuroimaging  [facility] [address], [place], [place] [address] [phone]

## 2017-11-18 NOTE — Patient Instructions (Signed)
-   start depakote 500mg  twice a day (to help with seizure prevention, migraine prevention; also may help with mood stabilization)  - rizatriptan 10mg  as needed for breakthrough headache; may repeat x 1 after 2 hours; max 2 tabs per day or 8 per month  - stop sumatriptan (not effective)

## 2017-11-26 ENCOUNTER — Telehealth: Payer: Self-pay | Admitting: Diagnostic Neuroimaging

## 2017-11-26 ENCOUNTER — Ambulatory Visit: Payer: Medicare Other | Admitting: Psychology

## 2017-11-26 NOTE — Telephone Encounter (Signed)
Try depakote 500mg  daily x 2 weeks, then increase to twice a day. -VRP

## 2017-11-26 NOTE — Telephone Encounter (Signed)
Pt called stating her MG dosing for divalproex (DEPAKOTE) 500 MG DR tablet is too strong, stating at this dose she would rather stay in bed all day. Requesting a call to discuss lowering the dosage

## 2017-11-26 NOTE — Telephone Encounter (Signed)
Spoke with patient and advised her per Dr Leta Baptist she should decrease depakote to once day for 2 weeks, then increase back to twice daily. Advised she take it at night since it is making her so sleepy. Advised she call back after increasing to update how she is doing, and hopefully she will have adjusted to medication better and can continue with it twice daily. She verbalized understanding, appreciation.

## 2017-11-29 ENCOUNTER — Ambulatory Visit
Admission: RE | Admit: 2017-11-29 | Discharge: 2017-11-29 | Disposition: A | Discharge status: Home / Self Care | Payer: Medicare Other | Source: Ambulatory Visit | Attending: Internal Medicine | Admitting: Internal Medicine

## 2017-11-29 DIAGNOSIS — G43909 Migraine, unspecified, not intractable, without status migrainosus: Secondary | ICD-10-CM

## 2017-11-29 DIAGNOSIS — R569 Unspecified convulsions: Secondary | ICD-10-CM

## 2017-11-30 NOTE — Telephone Encounter (Signed)
Pt called stating she lowered medication to 1 tablet daily but due to increased migraines she had started 2 tablets daily again. Stating she would rather deal with the s/e over a migraine. Appreciated Dr. Leta Baptist trying to help her.

## 2017-11-30 NOTE — Telephone Encounter (Signed)
Spoke with patient who stated she began having migraines after decreasing depakote to once a day. She increased it back to twice daily and stated she has felt much better. She stated she still feels "slow" but would rather not have headaches. This RN advised she try it for a week and hopefully she'll adjust to it. Advised her if she does not to please call and let this RN know so that Dr Leta Baptist could consider other medications. She verbalized understanding, appreciation.

## 2017-12-07 NOTE — Telephone Encounter (Signed)
LVM requesting call back.

## 2017-12-07 NOTE — Telephone Encounter (Addendum)
Received call back from patient who stated in the past week she's not had a migraine, but has had 2-3 headaches. She is taking depakote twice daily.  Fioricet does not give her any relief. Rizatriptan is too costly for her at $14/ 9 pills. She stated her PCP has told her not to take Aleve. She stated Tylenol has never helped her headaches.  She stated she realizes a lot of it has to do with her anxiety, however she wants relief from her headaches.  This RN stated will discuss with Dr Leta Baptist and call her back with his recommendations. She verbalized understanding, appreciation.

## 2017-12-07 NOTE — Telephone Encounter (Signed)
Pt has called to inform that the divalproex (DEPAKOTE) 500 MG DR tablet has helped but when she takes the butalbital-acetaminophen-caffeine (FIORICET, ESGIC) 50-325-40 MG tablet with it she  Does not feels the FIORICET.  Pt is asking for a call to discuss

## 2017-12-08 NOTE — Telephone Encounter (Signed)
Called patient and advised her that Dr Leta Baptist doesn't have any further medication recommendations at this time. He does agree with her seeing a psychiatrist. She stated she is seeing one.  This RN advised she be aware of triggers for her headaches, anxiety. She verbalized understanding, appreciation.

## 2017-12-08 NOTE — Telephone Encounter (Signed)
Agree with psychiatry eval. No other suggestions at this time. -VRP

## 2017-12-14 ENCOUNTER — Telehealth: Payer: Self-pay

## 2017-12-14 NOTE — Telephone Encounter (Signed)
Call pt I would rec CT ab/pelvis to further work up this week  Is she agreeable?  Then we can sent her to a surgeon based on results   Thanks [place]

## 2017-12-14 NOTE — Telephone Encounter (Signed)
Copied from [place] [id number]. Topic: General - Other >> Dec 14, 2017 10:31 AM Leward Quan A wrote: Reason for CRM: Patient called to say that she was having pain in the area where the bulge is in her abdomen. Stated that it was starting to hurt and now she is ready to be seen by a specialist for that issue. Patient discussed this problem briefly with doctor on last visit. Request a call back with a referral please. Ph# [phone]

## 2017-12-17 NOTE — Telephone Encounter (Signed)
Called and spoke with patient. Pt advised and is willing to have CT scan done can only do this on a Wednesday or Thursday.   When the referral is placed to see a surgeon if needed she wants to see someone in [place] she does NOT want to go to anyone in [place] clinic.  Sent to PCP

## 2017-12-18 ENCOUNTER — Other Ambulatory Visit: Payer: Self-pay | Admitting: Internal Medicine

## 2017-12-18 DIAGNOSIS — K469 Unspecified abdominal hernia without obstruction or gangrene: Secondary | ICD-10-CM

## 2017-12-18 NOTE — Telephone Encounter (Signed)
Please set up CT ab/pelvis for abdominal pain and hernia work up can go Weds/Thursday  Thanks [employer]

## 2017-12-30 ENCOUNTER — Ambulatory Visit
Admission: RE | Admit: 2017-12-30 | Discharge: 2017-12-30 | Disposition: A | Discharge status: Home / Self Care | Payer: Medicare Other | Source: Ambulatory Visit | Attending: Internal Medicine | Admitting: Internal Medicine

## 2017-12-30 DIAGNOSIS — K429 Umbilical hernia without obstruction or gangrene: Secondary | ICD-10-CM | POA: Diagnosis not present

## 2017-12-30 DIAGNOSIS — K469 Unspecified abdominal hernia without obstruction or gangrene: Secondary | ICD-10-CM | POA: Diagnosis not present

## 2017-12-30 DIAGNOSIS — K439 Ventral hernia without obstruction or gangrene: Secondary | ICD-10-CM | POA: Diagnosis not present

## 2018-01-05 ENCOUNTER — Other Ambulatory Visit: Payer: Self-pay | Admitting: Internal Medicine

## 2018-01-05 DIAGNOSIS — G43909 Migraine, unspecified, not intractable, without status migrainosus: Secondary | ICD-10-CM

## 2018-01-05 MED ORDER — BUTALBITAL-APAP-CAFFEINE 50-325-40 MG PO TABS: 1.0000 | ORAL_TABLET | Freq: Three times a day (TID) | ORAL | 2 refills | Status: DC | PRN

## 2018-01-06 ENCOUNTER — Telehealth: Payer: Self-pay | Admitting: *Deleted

## 2018-01-06 NOTE — Telephone Encounter (Addendum)
Patient refused evaluation and zofran she has appointment with PCP tomorrow and "I will do what I have  to do". "Taking Zofran is like taking water."

## 2018-01-06 NOTE — Telephone Encounter (Signed)
Called patient she ask  is there a control substance in phenergan, advised not controlled but can cause drowsiness, and requires prescription, patient was advising she has been having nausea since she was a teenager but today she has emesis 4-6 times , advised patient PCP could hearr converstion and that she advised UC , patient sad not happening and hung up the phone.

## 2018-01-06 NOTE — Telephone Encounter (Signed)
Copied from [place] [id number]. Topic: General - Other >> Jan 06, 2018  9:08 AM Janace Aris A wrote: Reason for CRM: Pt called in wanting to know if she can have a medication called in to the pharmacy for the nausea she is experiencing.Marland Kitchen

## 2018-01-06 NOTE — Telephone Encounter (Signed)
We will not be Rx phenerghan rec she go to urgent care for nausea or we can refer her to GI  She can try zofran as needed   [place]

## 2018-01-07 ENCOUNTER — Ambulatory Visit: Payer: Self-pay | Admitting: Internal Medicine

## 2018-01-07 ENCOUNTER — Telehealth: Payer: Self-pay

## 2018-01-07 DIAGNOSIS — Z0289 Encounter for other administrative examinations: Secondary | ICD-10-CM

## 2018-01-07 NOTE — Telephone Encounter (Signed)
Juliann Pulse please start dismissal letter please from me  Thanks  [place]

## 2018-01-07 NOTE — Telephone Encounter (Signed)
Copied from [place] [id number]. Topic: General - Other >> Jan 07, 2018 12:29 PM Mcneil, Ja-Kwan wrote: Reason for CRM: Pt stated she missed her appt today due to nausea and throwing up. Pt stated she likes the office but if the doctor is not going to prescribe phenergan then she has to move on because it is the only thing that work. Pt said thank you and hung-up.

## 2018-01-18 NOTE — Telephone Encounter (Signed)
I am ok with her transferring care but she has drug seeking behaviors at times which is why I will no longer want to see her   Kayla Shah

## 2018-01-25 NOTE — Telephone Encounter (Addendum)
Pt called stating that she doesn't want to change providers. She would like to stay with Dr. Terese Door. Pt states "I showed my a**" and intends to apologize to everyone in the office. She would like to keep appt with Dr. Terese Door 02/18/18 and not transfer. She said she talked to someone this morning and thought it was taken care of. Pt states that she is needing to f/u on CT from November. Please advise pt.  Call back # [phone]

## 2018-01-25 NOTE — Telephone Encounter (Signed)
I spoke with the provider she no longer wants to see this patient. She may establish care with another provider within the practice that is excepting new patients, TOC or another Conservation officer, historic buildings.

## 2018-01-26 NOTE — Telephone Encounter (Signed)
Dr Olivia Mackie would not like to see patient anymore.  Note sent to [facility] to dismiss patient.

## 2018-01-26 NOTE — Telephone Encounter (Signed)
FYI. Please advise.

## 2018-01-26 NOTE — Telephone Encounter (Signed)
This is okay to do   Kayla Shah

## 2018-02-08 NOTE — Telephone Encounter (Signed)
The patient has been scheduled with [facility]

## 2018-02-08 NOTE — Telephone Encounter (Signed)
FYI

## 2018-02-09 NOTE — Telephone Encounter (Signed)
Spoke with dr. Aundra Dubin she advised she would want to proceed with discharging patient

## 2018-02-09 NOTE — Telephone Encounter (Signed)
Completed discharge form given to Dr. Aundra Dubin for completion.

## 2018-02-12 ENCOUNTER — Telehealth: Payer: Self-pay | Admitting: Internal Medicine

## 2018-02-12 NOTE — Telephone Encounter (Signed)
Copied from [place] [phone]. Topic: Quick Communication - See Telephone Encounter >> Feb 12, 2018 12:27 PM Blase Mess A wrote: CRM for notification. See Telephone encounter for: 02/12/18.  Patient is calling regarding the gabapentin (NEURONTIN) 300 MG capsule [[id number]]   Patient is requesting the medication to be changed to xanax.  Patinent has an appt with Dr. Olivia Mackie next week and would like to discuss. Please advise [phone]

## 2018-02-12 NOTE — Telephone Encounter (Signed)
We will not be prescribing her xanax  If anxiety is not controlled she will need to find a psychiatrist    [place]

## 2018-02-15 NOTE — Telephone Encounter (Signed)
Patient has been informed. No questions, comments, and or concerns at this time.

## 2018-02-18 ENCOUNTER — Ambulatory Visit: Payer: Self-pay | Admitting: Family Medicine

## 2018-02-18 ENCOUNTER — Ambulatory Visit: Payer: Self-pay | Admitting: Internal Medicine

## 2018-03-01 ENCOUNTER — Telehealth: Payer: Self-pay | Admitting: Internal Medicine

## 2018-03-01 NOTE — Telephone Encounter (Signed)
Patient dismissed from [facility] by Olivia Mackie McLean-Scocuzza MD, effective 02/17/18. Dismissal letter sent out by 1st class mail. LM

## 2018-03-03 ENCOUNTER — Ambulatory Visit: Payer: Self-pay | Admitting: Nurse Practitioner

## 2018-03-09 NOTE — Telephone Encounter (Signed)
This is completed.  

## 2018-03-10 ENCOUNTER — Encounter: Payer: Self-pay | Admitting: Family Medicine

## 2018-03-10 ENCOUNTER — Ambulatory Visit: Payer: Medicare Other | Admitting: Family Medicine

## 2018-03-10 VITALS — BP 130/87 | HR 65 | Temp 98.5°F | Ht 65.0 in | Wt 111.0 lb

## 2018-03-10 DIAGNOSIS — G43009 Migraine without aura, not intractable, without status migrainosus: Secondary | ICD-10-CM

## 2018-03-10 DIAGNOSIS — I10 Essential (primary) hypertension: Secondary | ICD-10-CM | POA: Diagnosis not present

## 2018-03-10 DIAGNOSIS — F332 Major depressive disorder, recurrent severe without psychotic features: Secondary | ICD-10-CM

## 2018-03-10 DIAGNOSIS — Z1159 Encounter for screening for other viral diseases: Secondary | ICD-10-CM

## 2018-03-10 DIAGNOSIS — R569 Unspecified convulsions: Secondary | ICD-10-CM

## 2018-03-10 DIAGNOSIS — F4000 Agoraphobia, unspecified: Secondary | ICD-10-CM

## 2018-03-10 DIAGNOSIS — Z114 Encounter for screening for human immunodeficiency virus [HIV]: Secondary | ICD-10-CM | POA: Diagnosis not present

## 2018-03-10 DIAGNOSIS — E7800 Pure hypercholesterolemia, unspecified: Secondary | ICD-10-CM

## 2018-03-10 MED ORDER — HYDROXYZINE HCL 25 MG PO TABS: 25.0000 mg | ORAL_TABLET | Freq: Three times a day (TID) | ORAL | 1 refills | Status: DC | PRN

## 2018-03-10 NOTE — Assessment & Plan Note (Signed)
Stable on zocor. Continue current regimen

## 2018-03-10 NOTE — Assessment & Plan Note (Signed)
Stable on depakote, continue per Neurology

## 2018-03-10 NOTE — Assessment & Plan Note (Signed)
Seems to be getting good benefit from maxalt. Long discussion today about the risks and side effects of fioricet and recommended several safer, more effective options. Pt agreeable to continuing prn maxalt. Again asks if I can give her several tabs of fioricet and I re-iterated that I will not fill this medication.

## 2018-03-10 NOTE — Assessment & Plan Note (Signed)
Discussed importance of seeing Psychiatry given the severe extent of her symptoms at this point and hesitancy to try medications other than Benzo's for her sxs. Pt will think about it but is too worried about finances to pursue psychiatry or counseling at this time. Will trial hydroxyzine to help with the day to day worrying she's feeling. Offered multiple other medications but pt declines

## 2018-03-10 NOTE — Assessment & Plan Note (Signed)
Refuses medicines due to past hx of side effects with multiple. Strongly encouraged Psychiatry/counseling, she will consider and let us know. No SI/HI.

## 2018-03-10 NOTE — Assessment & Plan Note (Signed)
Stable and under good control. Continue current regimen 

## 2018-03-10 NOTE — Progress Notes (Signed)
BP 130/87   Pulse 65   Temp 98.5 F (36.9 C) (Oral)   Ht 5\' 5"  (1.651 m)   Wt 111 lb (50.3 kg)   SpO2 97%   BMI 18.47 kg/m    Subjective:    Patient ID: Kayla Shah, female    DOB: [birth year], 63 y.o.   MRN: [id number]  HPI: Kayla Shah is a 63 y.o. female  Chief Complaint  Patient presents with  . New Patient (Initial Visit)  . Medication Management    Patient would like RX for [place]   . Anxiety    Used to be under control. Husband recently had heart attack & lost his job. Now living on one income  . Health Maintenance    HIV - ordered; Hep C - ordered; Tdap - pt would like to discuss w/ provider first; Flu - declined.    Here today to Est care.  HTN - takes verapamil daily, does not check BPs at home. Tolerating the medicine well, no side effects, CP, SOB, dizziness.   Worrying all the time. Used to be under good control but husband recently laid off and now having significant stressors. The past few years has been mostly confining herself to her home because she gets so worked up if she goes out in public and states it's just easier to stay home. States she used to be on xanax several times daily which seems to really help her - requesting refill today. Does not do well on antidepressants because of nausea and insomnia. Tried seroquel and abilify in the past which she states did the same thing. Tried counseling in the past, does not want to go to Psychiatry at this time due to finances. Denies SI/HI.   Followed by Neurology for seizures, on depakote and has not had a seizure since starting that medicine.   States she's on gabapentin for nerve pain. Does not specify what type of nerve pain she's been dealing with.   Hx of migraines since age 13. Was given nortriptyline last year but never took it. Was switched by Neurologist from imitrex to [place] and feels the maxalt does well to get rid of the headaches quickly. Never even has to take the second dose. Has been  on fioricet for quite some time as well and really would like a refill on this one.     Relevant past medical, surgical, family and social history reviewed and updated as indicated. Interim medical history since our last visit reviewed. Allergies and medications reviewed and updated.  Review of Systems  Per HPI unless specifically indicated above     Objective:    BP 130/87   Pulse 65   Temp 98.5 F (36.9 C) (Oral)   Ht 5\' 5"  (1.651 m)   Wt 111 lb (50.3 kg)   SpO2 97%   BMI 18.47 kg/m   Wt Readings from Last 3 Encounters:  03/10/18 111 lb (50.3 kg)  11/18/17 112 lb 9.6 oz (51.1 kg)  11/29/17 114 lb (51.7 kg)    Physical Exam Vitals signs and nursing note reviewed.  Constitutional:      Appearance: Normal appearance. She is not ill-appearing.  HENT:     Head: Atraumatic.  Eyes:     Extraocular Movements: Extraocular movements intact.     Conjunctiva/sclera: Conjunctivae normal.  Neck:     Musculoskeletal: Normal range of motion and neck supple.  Cardiovascular:     Rate and Rhythm: Normal rate and regular rhythm.  Heart sounds: Normal heart sounds.  Pulmonary:     Effort: Pulmonary effort is normal.     Breath sounds: Normal breath sounds.  Musculoskeletal: Normal range of motion.  Skin:    General: Skin is warm and dry.  Neurological:     Mental Status: She is alert and oriented to person, place, and time.  Psychiatric:        Mood and Affect: Mood normal.        Thought Content: Thought content normal.        Judgment: Judgment normal.     Results for orders placed or performed during the hospital encounter of 11/01/17  Urinalysis, Complete w Microscopic  Result Value Ref Range   Color, Urine STRAW (A) YELLOW   APPearance CLEAR (A) CLEAR   Specific Gravity, Urine 1.011 1.005 - 1.030   pH 8.0 5.0 - 8.0   Glucose, UA NEGATIVE NEGATIVE mg/dL   Hgb urine dipstick SMALL (A) NEGATIVE   Bilirubin Urine NEGATIVE NEGATIVE   Ketones, ur NEGATIVE NEGATIVE  mg/dL   Protein, ur NEGATIVE NEGATIVE mg/dL   Nitrite NEGATIVE NEGATIVE   Leukocytes, UA NEGATIVE NEGATIVE   RBC / HPF >50 (H) 0 - 5 RBC/hpf   WBC, UA 0-5 0 - 5 WBC/hpf   Bacteria, UA NONE SEEN NONE SEEN   Squamous Epithelial / LPF NONE SEEN 0 - 5  Urine Drug Screen, Qualitative  Result Value Ref Range   Tricyclic, Ur Screen NONE DETECTED NONE DETECTED   Amphetamines, Ur Screen NONE DETECTED NONE DETECTED   MDMA (Ecstasy)Ur Screen NONE DETECTED NONE DETECTED   Cocaine Metabolite,Ur [place] NONE DETECTED NONE DETECTED   Opiate, Ur Screen NONE DETECTED NONE DETECTED   Phencyclidine (PCP) Ur S NONE DETECTED NONE DETECTED   Cannabinoid 50 Ng, Ur [place] POSITIVE (A) NONE DETECTED   Barbiturates, Ur Screen NONE DETECTED NONE DETECTED   Benzodiazepine, Ur Scrn NONE DETECTED NONE DETECTED   Methadone Scn, Ur NONE DETECTED NONE DETECTED  Comprehensive metabolic panel  Result Value Ref Range   Sodium 140 135 - 145 mmol/L   Potassium 3.0 (L) 3.5 - 5.1 mmol/L   Chloride 103 98 - 111 mmol/L   CO2 16 (L) 22 - 32 mmol/L   Glucose, Bld 160 (H) 70 - 99 mg/dL   BUN 14 8 - 23 mg/dL   Creatinine, Ser 0.87 0.44 - 1.00 mg/dL   Calcium 8.9 8.9 - 10.3 mg/dL   Total Protein 7.7 6.5 - 8.1 g/dL   Albumin 4.3 3.5 - 5.0 g/dL   AST 26 15 - 41 U/L   ALT 8 0 - 44 U/L   Alkaline Phosphatase 49 38 - 126 U/L   Total Bilirubin 0.6 0.3 - 1.2 mg/dL   GFR calc non Af Amer >60 >60 mL/min   GFR calc Af Amer >60 >60 mL/min   Anion gap 21 (H) 5 - 15  Ethanol  Result Value Ref Range   Alcohol, Ethyl (B) <32 <20 mg/dL  Salicylate level  Result Value Ref Range   Salicylate Lvl <2.5 2.8 - 30.0 mg/dL  Acetaminophen level  Result Value Ref Range   Acetaminophen (Tylenol), Serum <10 (L) 10 - 30 ug/mL  Lactic acid, plasma  Result Value Ref Range   Lactic Acid, Venous 13.2 (HH) 0.5 - 1.9 mmol/L  CBC with Differential  Result Value Ref Range   WBC 11.4 (H) 3.6 - 11.0 K/uL   RBC 3.78 (L) 3.80 - 5.20 MIL/uL   Hemoglobin 13.4  12.0 - 16.0 g/dL   HCT 39.1 35.0 - 47.0 %   MCV 103.5 (H) 80.0 - 100.0 fL   MCH 35.5 (H) 26.0 - 34.0 pg   MCHC 34.3 32.0 - 36.0 g/dL   RDW 13.5 11.5 - 14.5 %   Platelets 193 150 - 440 K/uL   Neutrophils Relative % 76 %   Neutro Abs 8.8 (H) 1.4 - 6.5 K/uL   Lymphocytes Relative 18 %   Lymphs Abs 2.1 1.0 - 3.6 K/uL   Monocytes Relative 4 %   Monocytes Absolute 0.4 0.2 - 0.9 K/uL   Eosinophils Relative 1 %   Eosinophils Absolute 0.1 0 - 0.7 K/uL   Basophils Relative 1 %   Basophils Absolute 0.1 0 - 0.1 K/uL  Troponin I  Result Value Ref Range   Troponin I <0.03 <0.03 ng/mL      Assessment & Plan:   Problem List Items Addressed This Visit      Cardiovascular and Mediastinum   Essential (primary) hypertension    Stable and under good control. Continue current regimen      Relevant Orders   Basic Metabolic Panel (BMET)   Migraine    Seems to be getting good benefit from maxalt. Long discussion today about the risks and side effects of fioricet and recommended several safer, more effective options. Pt agreeable to continuing prn maxalt. Again asks if I can give her several tabs of fioricet and I re-iterated that I will not fill this medication.         Other   Depression    Refuses medicines due to past hx of side effects with multiple. Strongly encouraged Psychiatry/counseling, she will consider and let us know. No SI/HI.       Relevant Medications   hydrOXYzine (ATARAX/VISTARIL) 25 MG tablet   Pure hypercholesterolemia    Stable on zocor. Continue current regimen      Seizures (HCC)    Stable on depakote, continue per Neurology      Agoraphobia    Discussed importance of seeing Psychiatry given the severe extent of her symptoms at this point and hesitancy to try medications other than Benzo's for her sxs. Pt will think about it but is too worried about finances to pursue psychiatry or counseling at this time. Will trial hydroxyzine to help with the day to day worrying  she's feeling. Offered multiple other medications but pt declines      Relevant Medications   hydrOXYzine (ATARAX/VISTARIL) 25 MG tablet    Other Visit Diagnoses    Need for hepatitis C screening test    -  Primary   Encounter for screening for HIV           Follow up plan: Return in about 6 months (around 09/08/2018) for BP.

## 2018-03-11 LAB — BASIC METABOLIC PANEL
BUN / CREAT RATIO: 16 (ref 12–28)
BUN: 13 mg/dL (ref 8–27)
CO2: 24 mmol/L (ref 20–29)
CREATININE: 0.81 mg/dL (ref 0.57–1.00)
Calcium: 9 mg/dL (ref 8.7–10.3)
Chloride: 99 mmol/L (ref 96–106)
GFR calc Af Amer: 91 mL/min/{1.73_m2} (ref >59)
GFR calc non Af Amer: 79 mL/min/{1.73_m2} (ref >59)
GLUCOSE: 80 mg/dL (ref 65–99)
Potassium: 3.8 mmol/L (ref 3.5–5.2)
Sodium: 140 mmol/L (ref 134–144)

## 2018-03-16 ENCOUNTER — Telehealth: Payer: Self-pay | Admitting: Diagnostic Neuroimaging

## 2018-03-16 NOTE — Telephone Encounter (Signed)
Pt would like to know if she can get some butalbital-acetaminophen-caffeine (FIORICET, ESGIC) 50-325-40 MG tablet sent to her pharmacy since she is still experiencing headaches. She would like it sent to  [facility].

## 2018-03-16 NOTE — Telephone Encounter (Signed)
Spoke with patient and informed her that Dr Leta Baptist will not refill Fioricet for her. Advised she ask provider who prescribed it. She verbalized understanding, appreciation.

## 2018-03-16 NOTE — Telephone Encounter (Signed)
No to fioricet. -VRP

## 2018-03-22 ENCOUNTER — Other Ambulatory Visit: Payer: Self-pay | Admitting: Family Medicine

## 2018-03-22 DIAGNOSIS — Z1231 Encounter for screening mammogram for malignant neoplasm of breast: Secondary | ICD-10-CM

## 2018-03-31 ENCOUNTER — Ambulatory Visit
Admission: RE | Admit: 2018-03-31 | Discharge: 2018-03-31 | Disposition: A | Discharge status: Home / Self Care | Payer: Medicare Other | Source: Ambulatory Visit | Attending: Family Medicine | Admitting: Family Medicine

## 2018-03-31 DIAGNOSIS — Z1231 Encounter for screening mammogram for malignant neoplasm of breast: Secondary | ICD-10-CM | POA: Diagnosis present

## 2018-05-14 ENCOUNTER — Other Ambulatory Visit: Payer: Self-pay | Admitting: Family Medicine

## 2018-05-14 DIAGNOSIS — E7800 Pure hypercholesterolemia, unspecified: Secondary | ICD-10-CM

## 2018-05-19 ENCOUNTER — Telehealth: Payer: Self-pay | Admitting: *Deleted

## 2018-05-19 NOTE — Telephone Encounter (Signed)
Called patient and advised she can have a telephone visit with Dr Leta Baptist. She consented to telephone visit. Updated EMR. She stated she has not had any seizures, and her headaches are much improved on Depakote. She verbalized understanding, appreciation.

## 2018-05-19 NOTE — Telephone Encounter (Signed)
Pt called in and stated she doesn't have the capability for a video call. She states she is doing well and the depakote is working awesome for her

## 2018-05-19 NOTE — Telephone Encounter (Signed)
LVM advising due to current COVID 19 pandemic, our office is severely reducing in person visits in order to minimize the risk to our patients and healthcare providers. We recommend to convert your appointment to a video visit.  Requested call back to discuss.

## 2018-05-26 ENCOUNTER — Ambulatory Visit: Payer: Medicare Other | Admitting: Diagnostic Neuroimaging

## 2018-05-26 ENCOUNTER — Other Ambulatory Visit: Payer: Self-pay

## 2018-05-26 NOTE — Telephone Encounter (Addendum)
Called patient and noted that she did not connect by phone with Dr Leta Baptist today. She stated she has had phone by her all day, does not see a missed call. I apologized, uncertain as to the issue. We rescheduled her for Monday. She stated that only her mobile should be called [phone], verbalized understanding, appreciation.

## 2018-05-31 ENCOUNTER — Ambulatory Visit (INDEPENDENT_AMBULATORY_CARE_PROVIDER_SITE_OTHER): Payer: Medicare Other | Admitting: Diagnostic Neuroimaging

## 2018-05-31 ENCOUNTER — Other Ambulatory Visit: Payer: Self-pay

## 2018-05-31 DIAGNOSIS — G43009 Migraine without aura, not intractable, without status migrainosus: Secondary | ICD-10-CM

## 2018-05-31 DIAGNOSIS — G40909 Epilepsy, unspecified, not intractable, without status epilepticus: Secondary | ICD-10-CM | POA: Diagnosis not present

## 2018-05-31 MED ORDER — DIVALPROEX SODIUM 500 MG PO DR TAB: 500.0000 mg | DELAYED_RELEASE_TABLET | Freq: Two times a day (BID) | ORAL | 4 refills | Status: DC

## 2018-05-31 MED ORDER — RIZATRIPTAN BENZOATE 10 MG PO TBDP: 10.0000 mg | ORAL_TABLET | ORAL | 11 refills | Status: DC | PRN

## 2018-05-31 NOTE — Progress Notes (Signed)
    Virtual Visit via Telephone Note  I connected with Kayla Shah on 05/31/18 at 11:00 AM EDT by telephone and verified that I am speaking with the correct person using two identifiers.   I discussed the limitations, risks, security and privacy concerns of performing an evaluation and management service by telephone and the availability of in person appointments. I also discussed with the patient that there may be a patient responsible charge related to this service. The patient expressed understanding and agreed to proceed.   History of Present Illness:  - since last visit, doing well - depakote helping with HA and seizure prevention    Observations/Objective:  - awake and alert   Assessment and Plan:   63 y.o. female here with history of seizure disorder since 1990s, also with history of migraine headaches.  Also with concomitant severe depression and chronic pain.   Dx:  1. Migraine without aura and without status migrainosus, not intractable   2. Seizure disorder ([place])      PLAN:  MIGRAINE HEADACHES - continue depakote 500mg  twice a day (to help with seizure prevention, migraine prevention; also may help with mood stabilization) - rizatriptan 10mg  as needed for breakthrough headache; may repeat x 1 after 2 hours; max 2 tabs per day or 8 per month  Meds ordered this encounter  Medications  . rizatriptan (MAXALT-MLT) 10 MG disintegrating tablet    Sig: Take 1 tablet (10 mg total) by mouth as needed for migraine. May repeat in 2 hours if needed    Dispense:  9 tablet    Refill:  11  . divalproex (DEPAKOTE) 500 MG DR tablet    Sig: Take 1 tablet (500 mg total) by mouth 2 (two) times daily.    Dispense:  180 tablet    Refill:  4     Follow Up Instructions:  - Return in about 9 months (around 03/02/2019) for with NP ([name] Lomax).     I discussed the assessment and treatment plan with the patient. The patient was provided an opportunity to ask questions  and all were answered. The patient agreed with the plan and demonstrated an understanding of the instructions.   The patient was advised to call back or seek an in-person evaluation if the symptoms worsen or if the condition fails to improve as anticipated.  I provided 15 minutes of non-face-to-face time during this encounter.   Penni Bombard, MD 1/76/1607, 37:10 AM Certified in Neurology, Neurophysiology and Neuroimaging  [facility] [address] [place], [place] [address] [phone]

## 2018-06-12 ENCOUNTER — Emergency Department: Payer: Medicare Other

## 2018-06-12 ENCOUNTER — Encounter: Payer: Self-pay | Admitting: Emergency Medicine

## 2018-06-12 ENCOUNTER — Other Ambulatory Visit: Payer: Self-pay

## 2018-06-12 ENCOUNTER — Inpatient Hospital Stay
Admission: EM | Admit: 2018-06-12 | Discharge: 2018-06-13 | DRG: 101 | Disposition: A | Discharge status: Home / Self Care | Payer: Medicare Other | Attending: Family Medicine | Admitting: Family Medicine

## 2018-06-12 DIAGNOSIS — Z8262 Family history of osteoporosis: Secondary | ICD-10-CM | POA: Diagnosis not present

## 2018-06-12 DIAGNOSIS — M19022 Primary osteoarthritis, left elbow: Secondary | ICD-10-CM | POA: Diagnosis present

## 2018-06-12 DIAGNOSIS — Z7982 Long term (current) use of aspirin: Secondary | ICD-10-CM | POA: Diagnosis not present

## 2018-06-12 DIAGNOSIS — Z811 Family history of alcohol abuse and dependence: Secondary | ICD-10-CM

## 2018-06-12 DIAGNOSIS — I2510 Atherosclerotic heart disease of native coronary artery without angina pectoris: Secondary | ICD-10-CM | POA: Diagnosis present

## 2018-06-12 DIAGNOSIS — Z8349 Family history of other endocrine, nutritional and metabolic diseases: Secondary | ICD-10-CM

## 2018-06-12 DIAGNOSIS — R778 Other specified abnormalities of plasma proteins: Secondary | ICD-10-CM

## 2018-06-12 DIAGNOSIS — R569 Unspecified convulsions: Secondary | ICD-10-CM | POA: Diagnosis present

## 2018-06-12 DIAGNOSIS — G40909 Epilepsy, unspecified, not intractable, without status epilepticus: Principal | ICD-10-CM | POA: Diagnosis present

## 2018-06-12 DIAGNOSIS — Z20828 Contact with and (suspected) exposure to other viral communicable diseases: Secondary | ICD-10-CM | POA: Diagnosis present

## 2018-06-12 DIAGNOSIS — E7800 Pure hypercholesterolemia, unspecified: Secondary | ICD-10-CM | POA: Diagnosis present

## 2018-06-12 DIAGNOSIS — Z818 Family history of other mental and behavioral disorders: Secondary | ICD-10-CM

## 2018-06-12 DIAGNOSIS — Z79899 Other long term (current) drug therapy: Secondary | ICD-10-CM

## 2018-06-12 DIAGNOSIS — J449 Chronic obstructive pulmonary disease, unspecified: Secondary | ICD-10-CM | POA: Diagnosis present

## 2018-06-12 DIAGNOSIS — G43909 Migraine, unspecified, not intractable, without status migrainosus: Secondary | ICD-10-CM | POA: Diagnosis present

## 2018-06-12 DIAGNOSIS — Z8249 Family history of ischemic heart disease and other diseases of the circulatory system: Secondary | ICD-10-CM

## 2018-06-12 DIAGNOSIS — Z823 Family history of stroke: Secondary | ICD-10-CM

## 2018-06-12 DIAGNOSIS — Z885 Allergy status to narcotic agent status: Secondary | ICD-10-CM

## 2018-06-12 DIAGNOSIS — Z8261 Family history of arthritis: Secondary | ICD-10-CM | POA: Diagnosis not present

## 2018-06-12 DIAGNOSIS — Z841 Family history of disorders of kidney and ureter: Secondary | ICD-10-CM | POA: Diagnosis not present

## 2018-06-12 DIAGNOSIS — Z833 Family history of diabetes mellitus: Secondary | ICD-10-CM

## 2018-06-12 DIAGNOSIS — E785 Hyperlipidemia, unspecified: Secondary | ICD-10-CM | POA: Diagnosis present

## 2018-06-12 DIAGNOSIS — I10 Essential (primary) hypertension: Secondary | ICD-10-CM | POA: Diagnosis present

## 2018-06-12 DIAGNOSIS — R7989 Other specified abnormal findings of blood chemistry: Secondary | ICD-10-CM

## 2018-06-12 DIAGNOSIS — Z820 Family history of epilepsy and other diseases of the nervous system: Secondary | ICD-10-CM

## 2018-06-12 DIAGNOSIS — F17210 Nicotine dependence, cigarettes, uncomplicated: Secondary | ICD-10-CM | POA: Diagnosis present

## 2018-06-12 DIAGNOSIS — F419 Anxiety disorder, unspecified: Secondary | ICD-10-CM | POA: Diagnosis present

## 2018-06-12 DIAGNOSIS — K219 Gastro-esophageal reflux disease without esophagitis: Secondary | ICD-10-CM | POA: Diagnosis present

## 2018-06-12 DIAGNOSIS — Z888 Allergy status to other drugs, medicaments and biological substances status: Secondary | ICD-10-CM

## 2018-06-12 LAB — LIPID PANEL
Cholesterol: 152 mg/dL (ref 0–200)
HDL: 73 mg/dL (ref >40)
LDL Cholesterol: 70 mg/dL (ref 0–99)
Total CHOL/HDL Ratio: 2.1 RATIO
Triglycerides: 47 mg/dL (ref <150)
VLDL: 9 mg/dL (ref 0–40)

## 2018-06-12 LAB — CBC WITH DIFFERENTIAL/PLATELET
Abs Immature Granulocytes: 0.03 10*3/uL (ref 0.00–0.07)
Basophils Absolute: 0 10*3/uL (ref 0.0–0.1)
Basophils Relative: 0 %
Eosinophils Absolute: 0.1 10*3/uL (ref 0.0–0.5)
Eosinophils Relative: 1 %
HCT: 42.4 % (ref 36.0–46.0)
Hemoglobin: 13.9 g/dL (ref 12.0–15.0)
Immature Granulocytes: 0 %
Lymphocytes Relative: 15 %
Lymphs Abs: 1.3 10*3/uL (ref 0.7–4.0)
MCH: 33.7 pg (ref 26.0–34.0)
MCHC: 32.8 g/dL (ref 30.0–36.0)
MCV: 102.7 fL — ABNORMAL HIGH (ref 80.0–100.0)
Monocytes Absolute: 0.4 10*3/uL (ref 0.1–1.0)
Monocytes Relative: 4 %
Neutro Abs: 7 10*3/uL (ref 1.7–7.7)
Neutrophils Relative %: 80 %
Platelets: 155 10*3/uL (ref 150–400)
RBC: 4.13 MIL/uL (ref 3.87–5.11)
RDW: 12.8 % (ref 11.5–15.5)
WBC: 8.8 10*3/uL (ref 4.0–10.5)
nRBC: 0 % (ref 0.0–0.2)

## 2018-06-12 LAB — COMPREHENSIVE METABOLIC PANEL
ALT: 13 U/L (ref 0–44)
AST: 26 U/L (ref 15–41)
Albumin: 4 g/dL (ref 3.5–5.0)
Alkaline Phosphatase: 48 U/L (ref 38–126)
Anion gap: 14 (ref 5–15)
BUN: 16 mg/dL (ref 8–23)
CO2: 22 mmol/L (ref 22–32)
Calcium: 9.2 mg/dL (ref 8.9–10.3)
Chloride: 105 mmol/L (ref 98–111)
Creatinine, Ser: 1.01 mg/dL — ABNORMAL HIGH (ref 0.44–1.00)
GFR calc Af Amer: >60 mL/min (ref >60)
GFR calc non Af Amer: >60 mL/min (ref >60)
Glucose, Bld: 128 mg/dL — ABNORMAL HIGH (ref 70–99)
Potassium: 3.5 mmol/L (ref 3.5–5.1)
Sodium: 141 mmol/L (ref 135–145)
Total Bilirubin: 0.3 mg/dL (ref 0.3–1.2)
Total Protein: 8 g/dL (ref 6.5–8.1)

## 2018-06-12 LAB — URINALYSIS, COMPLETE (UACMP) WITH MICROSCOPIC
Bilirubin Urine: NEGATIVE
Glucose, UA: NEGATIVE mg/dL
Ketones, ur: 5 mg/dL — AB
Leukocytes,Ua: NEGATIVE
Nitrite: NEGATIVE
Protein, ur: NEGATIVE mg/dL
Specific Gravity, Urine: 1.014 (ref 1.005–1.030)
Squamous Epithelial / LPF: NONE SEEN (ref 0–5)
pH: 7 (ref 5.0–8.0)

## 2018-06-12 LAB — APTT: aPTT: 27 seconds (ref 24–36)

## 2018-06-12 LAB — GLUCOSE, CAPILLARY: Glucose-Capillary: 133 mg/dL — ABNORMAL HIGH (ref 70–99)

## 2018-06-12 LAB — VALPROIC ACID LEVEL
Valproic Acid Lvl: 33 ug/mL — ABNORMAL LOW (ref 50.0–100.0)
Valproic Acid Lvl: 22 ug/mL — ABNORMAL LOW (ref 50.0–100.0)

## 2018-06-12 LAB — PROTIME-INR
INR: 1.1 (ref 0.8–1.2)
Prothrombin Time: 13.9 seconds (ref 11.4–15.2)

## 2018-06-12 LAB — URINE DRUG SCREEN, QUALITATIVE (ARMC ONLY)
Amphetamines, Ur Screen: NOT DETECTED
Barbiturates, Ur Screen: POSITIVE — AB
Benzodiazepine, Ur Scrn: POSITIVE — AB
Cannabinoid 50 Ng, Ur ~~LOC~~: POSITIVE — AB
Cocaine Metabolite,Ur ~~LOC~~: NOT DETECTED
MDMA (Ecstasy)Ur Screen: NOT DETECTED
Methadone Scn, Ur: NOT DETECTED
Opiate, Ur Screen: NOT DETECTED
Phencyclidine (PCP) Ur S: NOT DETECTED
Tricyclic, Ur Screen: NOT DETECTED

## 2018-06-12 LAB — SARS CORONAVIRUS 2 BY RT PCR (HOSPITAL ORDER, PERFORMED IN ~~LOC~~ HOSPITAL LAB): SARS Coronavirus 2: NEGATIVE

## 2018-06-12 LAB — HEMOGLOBIN A1C
Hgb A1c MFr Bld: 5.6 % (ref 4.8–5.6)
Mean Plasma Glucose: 114.02 mg/dL

## 2018-06-12 LAB — TROPONIN I
Troponin I: 0.05 ng/mL (ref <0.03)
Troponin I: 0.56 ng/mL (ref <0.03)
Troponin I: 1.9 ng/mL (ref <0.03)
Troponin I: 1.42 ng/mL (ref <0.03)

## 2018-06-12 LAB — ETHANOL: Alcohol, Ethyl (B): <10 mg/dL (ref <10)

## 2018-06-12 LAB — HEPARIN LEVEL (UNFRACTIONATED): Heparin Unfractionated: <0.1 IU/mL — ABNORMAL LOW (ref 0.30–0.70)

## 2018-06-12 MED ORDER — HEPARIN SODIUM (PORCINE) 5000 UNIT/ML IJ SOLN: 5000.0000 [IU] | Freq: Three times a day (TID) | INTRAMUSCULAR | Status: DC

## 2018-06-12 MED ORDER — SODIUM CHLORIDE 0.9 % IV BOLUS
1000.0000 mL | Freq: Once | INTRAVENOUS | Status: AC
  Administered 2018-06-12: 1000 mL via INTRAVENOUS

## 2018-06-12 MED ORDER — SIMVASTATIN 20 MG PO TABS
20.0000 mg | ORAL_TABLET | Freq: Every day | ORAL | Status: DC
  Administered 2018-06-13: 20 mg via ORAL
  Filled 2018-06-12: qty 1

## 2018-06-12 MED ORDER — LORAZEPAM 2 MG/ML IJ SOLN: 2.0000 mg | INTRAMUSCULAR | Status: DC | PRN

## 2018-06-12 MED ORDER — HEPARIN BOLUS VIA INFUSION
4000.0000 [IU] | Freq: Once | INTRAVENOUS | Status: AC
  Administered 2018-06-12: 4000 [IU] via INTRAVENOUS
  Filled 2018-06-12: qty 4000

## 2018-06-12 MED ORDER — DIVALPROEX SODIUM 500 MG PO DR TAB
750.0000 mg | DELAYED_RELEASE_TABLET | Freq: Two times a day (BID) | ORAL | Status: DC
  Administered 2018-06-12: 750 mg via ORAL
  Filled 2018-06-12 (×3): qty 1

## 2018-06-12 MED ORDER — PANTOPRAZOLE SODIUM 40 MG PO TBEC
40.0000 mg | DELAYED_RELEASE_TABLET | Freq: Every day | ORAL | Status: DC
  Administered 2018-06-13: 40 mg via ORAL
  Filled 2018-06-12: qty 1

## 2018-06-12 MED ORDER — NICOTINE 21 MG/24HR TD PT24
21.0000 mg | MEDICATED_PATCH | Freq: Every day | TRANSDERMAL | Status: DC
  Filled 2018-06-12: qty 1

## 2018-06-12 MED ORDER — GABAPENTIN 300 MG PO CAPS
300.0000 mg | ORAL_CAPSULE | Freq: Two times a day (BID) | ORAL | Status: DC
  Administered 2018-06-12 – 2018-06-13 (×2): 300 mg via ORAL
  Filled 2018-06-12: qty 1

## 2018-06-12 MED ORDER — BUTALBITAL-APAP-CAFFEINE 50-325-40 MG PO TABS
1.0000 | ORAL_TABLET | Freq: Four times a day (QID) | ORAL | Status: AC | PRN
  Administered 2018-06-12 – 2018-06-13 (×2): 1 via ORAL
  Filled 2018-06-12 (×2): qty 1

## 2018-06-12 MED ORDER — RIZATRIPTAN BENZOATE 10 MG PO TBDP: 10.0000 mg | ORAL_TABLET | ORAL | Status: DC | PRN

## 2018-06-12 MED ORDER — VERAPAMIL HCL ER 240 MG PO TBCR
240.0000 mg | EXTENDED_RELEASE_TABLET | Freq: Every day | ORAL | Status: DC
  Administered 2018-06-13: 240 mg via ORAL
  Filled 2018-06-12 (×2): qty 1

## 2018-06-12 MED ORDER — LORAZEPAM 2 MG/ML IJ SOLN
1.0000 mg | Freq: Once | INTRAMUSCULAR | Status: AC
  Administered 2018-06-12: 1 mg via INTRAVENOUS
  Filled 2018-06-12: qty 1

## 2018-06-12 MED ORDER — ONDANSETRON HCL 4 MG/2ML IJ SOLN
4.0000 mg | Freq: Four times a day (QID) | INTRAMUSCULAR | Status: DC | PRN
  Administered 2018-06-12: 4 mg via INTRAVENOUS
  Filled 2018-06-12: qty 2

## 2018-06-12 MED ORDER — VITAMIN D3 25 MCG (1000 UNIT) PO TABS
1000.0000 [IU] | ORAL_TABLET | Freq: Every day | ORAL | Status: DC
  Administered 2018-06-13: 1000 [IU] via ORAL
  Filled 2018-06-12 (×2): qty 1

## 2018-06-12 MED ORDER — VALPROATE SODIUM 500 MG/5ML IV SOLN
500.0000 mg | Freq: Once | INTRAVENOUS | Status: AC
  Administered 2018-06-12: 500 mg via INTRAVENOUS
  Filled 2018-06-12: qty 5

## 2018-06-12 MED ORDER — DIVALPROEX SODIUM 500 MG PO DR TAB
500.0000 mg | DELAYED_RELEASE_TABLET | Freq: Two times a day (BID) | ORAL | Status: DC
  Filled 2018-06-12: qty 1

## 2018-06-12 MED ORDER — BUTALBITAL-APAP-CAFFEINE 50-325-40 MG PO TABS
1.0000 | ORAL_TABLET | Freq: Once | ORAL | Status: AC
  Administered 2018-06-12: 1 via ORAL
  Filled 2018-06-12: qty 1

## 2018-06-12 MED ORDER — ADULT MULTIVITAMIN W/MINERALS CH
1.0000 | ORAL_TABLET | Freq: Every day | ORAL | Status: DC
  Administered 2018-06-13: 1 via ORAL
  Filled 2018-06-12: qty 1

## 2018-06-12 MED ORDER — HEPARIN (PORCINE) 25000 UT/250ML-% IV SOLN
700.0000 [IU]/h | INTRAVENOUS | Status: DC
  Administered 2018-06-12: 850 [IU]/h via INTRAVENOUS
  Filled 2018-06-12: qty 250

## 2018-06-12 MED ORDER — SODIUM CHLORIDE 0.9 % IV SOLN
INTRAVENOUS | Status: DC | PRN
  Administered 2018-06-12: 250 mL via INTRAVENOUS

## 2018-06-12 MED ORDER — DIMENHYDRINATE 50 MG PO TABS
50.0000 mg | ORAL_TABLET | Freq: Every day | ORAL | Status: DC
  Administered 2018-06-13: 50 mg via ORAL
  Filled 2018-06-12 (×2): qty 1

## 2018-06-12 MED ORDER — ACETAMINOPHEN 500 MG PO TABS
1000.0000 mg | ORAL_TABLET | Freq: Once | ORAL | Status: AC
  Administered 2018-06-12: 1000 mg via ORAL
  Filled 2018-06-12: qty 2

## 2018-06-12 MED ORDER — ONDANSETRON HCL 4 MG/2ML IJ SOLN
4.0000 mg | Freq: Once | INTRAMUSCULAR | Status: AC
  Administered 2018-06-12: 4 mg via INTRAVENOUS
  Filled 2018-06-12: qty 2

## 2018-06-12 MED ORDER — DOCUSATE SODIUM 100 MG PO CAPS: 100.0000 mg | ORAL_CAPSULE | Freq: Two times a day (BID) | ORAL | Status: DC | PRN

## 2018-06-12 NOTE — ED Notes (Addendum)
ED TO INPATIENT HANDOFF REPORT  ED Nurse Name and Phone #: Carma Lair  S Name/Age/Gender Alwyn Ren 63 y.o. female Room/Bed: ED26A/ED26A  Code Status   Code Status: Not on file  Home/SNF/Other Home Patient oriented to: self Is this baseline? Yes   Triage Complete: Triage complete  Chief Complaint [facility] Seizure  Triage Note Pt arrived via EMS from home after she had two seizures this morning. Hx of the same. Pt does take Depakote with no known missed doses. First seizure was several hours ago and pt refused transport by EMS who had been called. 2nd seizure was approx 30 min ago. Pt husband reports he heard her hit the floor per EMS. Pt currently uncooperative and yelling loudly. 2mg  of versed given IM in left upper arm by EMS prior to arrival. FSBS 164 No obvious injury from fall during seizure at home. Pt denies pain at this time. Seizure pads in place on stretcher.    Allergies Allergies  Allergen Reactions  . Paroxetine Hcl Other (See Comments)    Pt. Becomes very aggressive with mood swings.  . Codeine Nausea And Vomiting  . Fluoxetine Anxiety  . Paroxetine Anxiety    Level of Care/Admitting Diagnosis ED Disposition    ED Disposition Condition Comment   Admit  The patient appears reasonably stabilized for admission considering the current resources, flow, and capabilities available in the ED at this time, and I doubt any other [facility] requiring further screening and/or treatment in the ED prior to admission is  present.       B Medical/Surgery History Past Medical History:  Diagnosis Date  . Allergy   . Anxiety    agoraphobia   . Arthritis   . Chicken pox   . COPD (chronic obstructive pulmonary disease) ([place])   . Depression   . GERD (gastroesophageal reflux disease)   . Headache   . History of blood transfusion    1984  . Hyperlipidemia   . Hypertension   . Migraine   . Seizure ([place])    was on keppra   Past Surgical History:  Procedure  Laterality Date  . ABDOMINAL HYSTERECTOMY  1984   endometriosis  . APPENDECTOMY    . COLONOSCOPY WITH PROPOFOL N/A 03/26/2017   Procedure: COLONOSCOPY WITH PROPOFOL;  Surgeon: Jonathon Bellows, MD;  Location: [facility] ENDOSCOPY;  Service: Gastroenterology;  Laterality: N/A;  . WISDOM TOOTH EXTRACTION       A IV Location/Drains/Wounds Patient Lines/Drains/Airways Status   Active Line/Drains/Airways    Name:   Placement date:   Placement time:   Site:   Days:   Peripheral IV 06/12/18 Right Arm   06/12/18    1044    Arm   less than 1          Intake/Output Last 24 hours No intake or output data in the 24 hours ending 06/12/18 1446  Labs/Imaging Results for orders placed or performed during the hospital encounter of 06/12/18 (from the past 48 hour(s))  Glucose, capillary     Status: Abnormal   Collection Time: 06/12/18  6:45 AM  Result Value Ref Range   Glucose-Capillary 133 (H) 70 - 99 mg/dL  CBC with Differential     Status: Abnormal   Collection Time: 06/12/18  6:47 AM  Result Value Ref Range   WBC 8.8 4.0 - 10.5 K/uL   RBC 4.13 3.87 - 5.11 MIL/uL   Hemoglobin 13.9 12.0 - 15.0 g/dL   HCT 42.4 36.0 - 46.0 %  MCV 102.7 (H) 80.0 - 100.0 fL   MCH 33.7 26.0 - 34.0 pg   MCHC 32.8 30.0 - 36.0 g/dL   RDW 12.8 11.5 - 15.5 %   Platelets 155 150 - 400 K/uL   nRBC 0.0 0.0 - 0.2 %   Neutrophils Relative % 80 %   Neutro Abs 7.0 1.7 - 7.7 K/uL   Lymphocytes Relative 15 %   Lymphs Abs 1.3 0.7 - 4.0 K/uL   Monocytes Relative 4 %   Monocytes Absolute 0.4 0.1 - 1.0 K/uL   Eosinophils Relative 1 %   Eosinophils Absolute 0.1 0.0 - 0.5 K/uL   Basophils Relative 0 %   Basophils Absolute 0.0 0.0 - 0.1 K/uL   Immature Granulocytes 0 %   Abs Immature Granulocytes 0.03 0.00 - 0.07 K/uL    Comment: Performed at [facility], [address] [place], [place] [address]  Comprehensive metabolic panel     Status: Abnormal   Collection Time: 06/12/18  6:47 AM  Result Value Ref Range   Sodium  141 135 - 145 mmol/L   Potassium 3.5 3.5 - 5.1 mmol/L   Chloride 105 98 - 111 mmol/L   CO2 22 22 - 32 mmol/L   Glucose, Bld 128 (H) 70 - 99 mg/dL   BUN 16 8 - 23 mg/dL   Creatinine, Ser 1.01 (H) 0.44 - 1.00 mg/dL   Calcium 9.2 8.9 - 10.3 mg/dL   Total Protein 8.0 6.5 - 8.1 g/dL   Albumin 4.0 3.5 - 5.0 g/dL   AST 26 15 - 41 U/L   ALT 13 0 - 44 U/L   Alkaline Phosphatase 48 38 - 126 U/L   Total Bilirubin 0.3 0.3 - 1.2 mg/dL   GFR calc non Af Amer >60 >60 mL/min   GFR calc Af Amer >60 >60 mL/min   Anion gap 14 5 - 15    Comment: Performed at [facility], [address] [place], [place] [address]  Troponin I - Once     Status: Abnormal   Collection Time: 06/12/18  6:47 AM  Result Value Ref Range   Troponin I 0.05 (HH) <0.03 ng/mL    Comment: CRITICAL RESULT CALLED TO, READ BACK BY AND VERIFIED WITH ANDREA BRYANT AT 0715 06/12/2018.PMF Performed at [facility], [address] [place], [place] [address]   Urinalysis, Complete w Microscopic     Status: Abnormal   Collection Time: 06/12/18  6:47 AM  Result Value Ref Range   Color, Urine YELLOW (A) YELLOW   APPearance CLOUDY (A) CLEAR   Specific Gravity, Urine 1.014 1.005 - 1.030   pH 7.0 5.0 - 8.0   Glucose, UA NEGATIVE NEGATIVE mg/dL   Hgb urine dipstick MODERATE (A) NEGATIVE   Bilirubin Urine NEGATIVE NEGATIVE   Ketones, ur 5 (A) NEGATIVE mg/dL   Protein, ur NEGATIVE NEGATIVE mg/dL   Nitrite NEGATIVE NEGATIVE   Leukocytes,Ua NEGATIVE NEGATIVE   RBC / HPF 6-10 0 - 5 RBC/hpf   WBC, UA 0-5 0 - 5 WBC/hpf   Bacteria, UA RARE (A) NONE SEEN   Squamous Epithelial / LPF NONE SEEN 0 - 5   Amorphous Crystal PRESENT     Comment: Performed at [facility], [address], [place], [place] [address]  Urine Drug Screen, Qualitative     Status: Abnormal   Collection Time: 06/12/18  6:47 AM  Result Value Ref Range   Tricyclic, Ur Screen NONE DETECTED NONE DETECTED   Amphetamines,  Ur Screen NONE DETECTED NONE  DETECTED   MDMA (Ecstasy)Ur Screen NONE DETECTED NONE DETECTED   Cocaine Metabolite,Ur [place] NONE DETECTED NONE DETECTED   Opiate, Ur Screen NONE DETECTED NONE DETECTED   Phencyclidine (PCP) Ur S NONE DETECTED NONE DETECTED   Cannabinoid 50 Ng, Ur [place] POSITIVE (A) NONE DETECTED   Barbiturates, Ur Screen POSITIVE (A) NONE DETECTED   Benzodiazepine, Ur Scrn POSITIVE (A) NONE DETECTED   Methadone Scn, Ur NONE DETECTED NONE DETECTED    Comment: (NOTE) Tricyclics + metabolites, urine    Cutoff 1000 ng/mL Amphetamines + metabolites, urine  Cutoff 1000 ng/mL MDMA (Ecstasy), urine              Cutoff 500 ng/mL Cocaine Metabolite, urine          Cutoff 300 ng/mL Opiate + metabolites, urine        Cutoff 300 ng/mL Phencyclidine (PCP), urine         Cutoff 25 ng/mL Cannabinoid, urine                 Cutoff 50 ng/mL Barbiturates + metabolites, urine  Cutoff 200 ng/mL Benzodiazepine, urine              Cutoff 200 ng/mL Methadone, urine                   Cutoff 300 ng/mL The urine drug screen provides only a preliminary, unconfirmed analytical test result and should not be used for non-medical purposes. Clinical consideration and professional judgment should be applied to any positive drug screen result due to possible interfering substances. A more specific alternate chemical method must be used in order to obtain a confirmed analytical result. Gas chromatography / mass spectrometry (GC/MS) is the preferred confirmat ory method. Performed at [facility], [address] [place], [place] [address]   Ethanol     Status: None   Collection Time: 06/12/18  9:40 AM  Result Value Ref Range   Alcohol, Ethyl (B) <10 <10 mg/dL    Comment: (NOTE) Lowest detectable limit for serum alcohol is 10 mg/dL. For medical purposes only. Performed at [facility], [address] [place], [place] [address]   Valproic acid level     Status: Abnormal   Collection Time: 06/12/18  9:40 AM   Result Value Ref Range   Valproic Acid Lvl 33 (L) 50.0 - 100.0 ug/mL    Comment: Performed at [facility], [address] [place], [place] [address]  Troponin I - Once     Status: Abnormal   Collection Time: 06/12/18 10:45 AM  Result Value Ref Range   Troponin I 0.56 (HH) <0.03 ng/mL    Comment: CRITICAL RESULT CALLED TO, READ BACK BY AND VERIFIED WITH HUNTER ORE AT 1140 06/12/2018.PMF Performed at [facility], [address], [place], [place] [address]   SARS Coronavirus 2 (CEPHEID - Performed in [facility] hospital lab), Hosp Order     Status: None   Collection Time: 06/12/18 11:50 AM  Result Value Ref Range   SARS Coronavirus 2 NEGATIVE NEGATIVE    Comment: (NOTE) If result is NEGATIVE SARS-CoV-2 target nucleic acids are NOT DETECTED. The SARS-CoV-2 RNA is generally detectable in upper and lower  respiratory specimens during the acute phase of infection. The lowest  concentration of SARS-CoV-2 viral copies this assay can detect is 250  copies / mL. A negative result does not preclude SARS-CoV-2 infection  and should not be used as the  sole basis for treatment or other  patient management decisions.  A negative result may occur with  improper specimen collection / handling, submission of specimen other  than nasopharyngeal swab, presence of viral mutation(s) within the  areas targeted by this assay, and inadequate number of viral copies  (<250 copies / mL). A negative result must be combined with clinical  observations, patient history, and epidemiological information. If result is POSITIVE SARS-CoV-2 target nucleic acids are DETECTED. The SARS-CoV-2 RNA is generally detectable in upper and lower  respiratory specimens dur ing the acute phase of infection.  Positive  results are indicative of active infection with SARS-CoV-2.  Clinical  correlation with patient history and other diagnostic information is  necessary to determine patient infection status.   Positive results do  not rule out bacterial infection or co-infection with other viruses. If result is PRESUMPTIVE POSTIVE SARS-CoV-2 nucleic acids MAY BE PRESENT.   A presumptive positive result was obtained on the submitted specimen  and confirmed on repeat testing.  While 2019 novel coronavirus  (SARS-CoV-2) nucleic acids may be present in the submitted sample  additional confirmatory testing may be necessary for epidemiological  and / or clinical management purposes  to differentiate between  SARS-CoV-2 and other Sarbecovirus currently known to infect humans.  If clinically indicated additional testing with an alternate test  methodology [phone]) is advised. The SARS-CoV-2 RNA is generally  detectable in upper and lower respiratory sp ecimens during the acute  phase of infection. The expected result is Negative. Fact Sheet for Patients:  StrictlyIdeas.no Fact Sheet for Healthcare Providers: BankingDealers.co.za This test is not yet approved or cleared by the [place] FDA and has been authorized for detection and/or diagnosis of SARS-CoV-2 by FDA under an Emergency Use Authorization (EUA).  This EUA will remain in effect (meaning this test can be used) for the duration of the COVID-19 declaration under Section 564(b)(1) of the Act, 21 U.S.C. section 360bbb-3(b)(1), unless the authorization is terminated or revoked sooner. Performed at [facility], [place], [place] [address]    Ct Head Wo Contrast  Result Date: 06/12/2018 CLINICAL DATA:  Per RN note in pt chart "Pt arrived via EMS from home after she had two seizures this morning. Hx of the same. Pt does take Depakote with no known missed doses. First seizure was several hours ago and pt refused transport by EMS .*comment was truncated*Cannot find appropriate structured reason for exam - see REASON FOR EXAM (FREE TEXT) seizure EXAM: CT HEAD WITHOUT  CONTRAST TECHNIQUE: Contiguous axial images were obtained from the base of the skull through the vertex without intravenous contrast. COMPARISON:  Brain MRI 11/29/2017 FINDINGS: Brain: No acute intracranial hemorrhage. No focal mass lesion. No CT evidence of acute infarction. No midline shift or mass effect. No hydrocephalus. Basilar cisterns are patent. Vascular: No hyperdense vessel or unexpected calcification. Skull: Normal. Negative for fracture or focal lesion. Sinuses/Orbits: Paranasal sinuses and mastoid air cells are clear. Orbits are clear. Other: None. IMPRESSION: No acute intracranial findings Electronically Signed   By: Suzy Bouchard M.D.   On: 06/12/2018 08:09   Dg Chest Port 1 View  Result Date: 06/12/2018 CLINICAL DATA:  Encounter for multiple seizures onset this morning. Patient denies SOB, CP, cough or fever. Hx HTN, GERD, COPD. Current smoker.seizure EXAM: PORTABLE CHEST 1 VIEW COMPARISON:  11/01/2017 FINDINGS: Normal mediastinum and cardiac silhouette. Chronic central bronchitic markings. Normal pulmonary vasculature. No effusion, infiltrate, or pneumothorax. IMPRESSION: No acute findings.  Chronic  bronchitic markings. Electronically Signed   By: Suzy Bouchard M.D.   On: 06/12/2018 07:35    Pending Labs Unresulted Labs (From admission, onward)   None      Vitals/Pain Today's Vitals   06/12/18 0700 06/12/18 1046 06/12/18 1245 06/12/18 1430  BP: 130/85 (!) 150/89  (!) 124/94  Pulse: 85 91 93   Resp: (!) 22 16 14    Temp:  99.7 F (37.6 C)    TempSrc:  Oral    SpO2: 100% 97% 95%   Weight:      Height:      PainSc:  Asleep      Isolation Precautions No active isolations  Medications Medications  acetaminophen (TYLENOL) tablet 1,000 mg (has no administration in time range)  sodium chloride 0.9 % bolus 1,000 mL (0 mLs Intravenous Stopped 06/12/18 1149)  LORazepam (ATIVAN) injection 1 mg (1 mg Intravenous Given 06/12/18 1023)  ondansetron (ZOFRAN) injection 4 mg (4 mg  Intravenous Given 06/12/18 1408)    Mobility  High fall risk   Focused Assessments    R Recommendations: See Admitting Provider Note  Report given to: Manuela Schwartz, RN

## 2018-06-12 NOTE — ED Notes (Signed)
Patient transported to CT 

## 2018-06-12 NOTE — H&P (Signed)
[place] [facility] [place] NAME: Kayla Shah    MR#:  [id number]  DATE OF BIRTH:  09-18-1955  DATE OF ADMISSION:  06/12/2018  PRIMARY CARE PHYSICIAN: Volney American, PA-C   REQUESTING/REFERRING PHYSICIAN: Saidecki  CHIEF COMPLAINT:   Chief Complaint  Patient presents with  . Seizures    HISTORY OF PRESENT ILLNESS: Kayla Shah  is a 63 y.o. female with a known history of anxiety, arthritis, COPD, depression, hyperlipidemia, hypertension, seizures, migraine-follows at [facility] neurology and takes Depakote at home.  She and her husband who I spoke to on the phone-both denies any recent change in the Depakote dosing or she missing any of her pills in last few days. In the morning around 4, she was walking in the house and had seizures and she fell down she walked her husband up.  Husband called EMS but by the time they came she was fully alert and oriented and she refused to go to emergency room.  Again after 2 to 3 hours she had one more episode of seizures.  She fell down with that her again on the floor and was shaking whole body.  She was confused for a few minutes after that.  Husband called EMS and sent her to emergency room. Patient denies any other complaints other than that.  She does not have any chest pain shortness of breath or palpitation. While I saw in the emergency room she was complaining of severe headache. Her troponin was noted to be elevated in the ER but due to no changes on EKG and having seizure episodes, heparin is not started by ER physician.  PAST MEDICAL HISTORY:   Past Medical History:  Diagnosis Date  . Allergy   . Anxiety    agoraphobia   . Arthritis   . Chicken pox   . COPD (chronic obstructive pulmonary disease) ([place])   . Depression   . GERD (gastroesophageal reflux disease)   . Headache   . History of blood transfusion    1984  . Hyperlipidemia   . Hypertension   . Migraine   . Seizure ([place])     was on keppra    PAST SURGICAL HISTORY:  Past Surgical History:  Procedure Laterality Date  . ABDOMINAL HYSTERECTOMY  1984   endometriosis  . APPENDECTOMY    . COLONOSCOPY WITH PROPOFOL N/A 03/26/2017   Procedure: COLONOSCOPY WITH PROPOFOL;  Surgeon: Jonathon Bellows, MD;  [facility] ENDOSCOPY;  Service: Gastroenterology;  Laterality: N/A;  . WISDOM TOOTH EXTRACTION      SOCIAL HISTORY:  Social History   Tobacco Use  . Smoking status: Current Every Day Smoker    Packs/day: 1.00    Years: 45.00    Pack years: 45.00    Types: Cigarettes  . Smokeless tobacco: Never Used  Substance Use Topics  . Alcohol use: No    FAMILY HISTORY:  Family History  Problem Relation Age of Onset  . Stroke Mother   . Depression Mother   . Arthritis Mother   . Heart disease Father   . Alcohol abuse Father   . Diabetes Father   . Osteoporosis Sister   . Kidney disease Sister   . Arthritis Sister   . Arthritis Maternal Grandmother   . Heart disease Maternal Grandmother   . Hyperlipidemia Maternal Grandmother   . Diabetes Paternal Grandmother   . Breast cancer Neg Hx     DRUG ALLERGIES:  Allergies  Allergen Reactions  .  Paroxetine Hcl Other (See Comments)    Pt. Becomes very aggressive with mood swings.  . Codeine Nausea And Vomiting  . Fluoxetine Anxiety  . Paroxetine Anxiety    REVIEW OF SYSTEMS:   CONSTITUTIONAL: No fever, some fatigue or weakness.  EYES: No blurred or double vision.  EARS, NOSE, AND THROAT: No tinnitus or ear pain.  RESPIRATORY: No cough, shortness of breath, wheezing or hemoptysis.  CARDIOVASCULAR: No chest pain, orthopnea, edema.  GASTROINTESTINAL: No nausea, vomiting, diarrhea or abdominal pain.  GENITOURINARY: No dysuria, hematuria.  ENDOCRINE: No polyuria, nocturia,   HEMATOLOGY: No anemia, easy bruising or bleeding SKIN: No rash or lesion. MUSCULOSKELETAL: No joint pain or arthritis.   NEUROLOGIC: No tingling, numbness, weakness.  PSYCHIATRY: No  anxiety or depression.   MEDICATIONS AT HOME:  Prior to Admission medications   Medication Sig Start Date End Date Taking? Authorizing Provider  Calcium-Magnesium-Vitamin D (CALCIUM 500 PO) Take 1 tablet by mouth daily.    Yes [provider]  cholecalciferol (VITAMIN D) 1000 units tablet Take 1,000 Units by mouth daily.   Yes [provider]  divalproex (DEPAKOTE) 500 MG DR tablet Take 1 tablet (500 mg total) by mouth 2 (two) times daily. 05/31/18  Yes Penumalli, Earlean Polka, MD  gabapentin (NEURONTIN) 300 MG capsule Take 1 capsule (300 mg total) by mouth 2 (two) times daily. 10/27/17  Yes McLean-Scocuzza, Nino Glow, MD  pantoprazole (PROTONIX) 40 MG tablet Take 1 tablet (40 mg total) by mouth daily. 06/07/15  Yes Krebs, [name] Lauren, NP  simvastatin (ZOCOR) 20 MG tablet TAKE 1 TABLET BY MOUTH DAILY 05/14/18  Yes Volney American, PA-C  dimenhyDRINATE (DRAMAMINE) 50 MG tablet Take 50 mg by mouth daily.    [provider]  hydrOXYzine (ATARAX/VISTARIL) 25 MG tablet Take 1 tablet (25 mg total) by mouth 3 (three) times daily as needed. Patient not taking: Reported on 05/19/2018 03/10/18   Volney American, PA-C  rizatriptan (MAXALT-MLT) 10 MG disintegrating tablet Take 1 tablet (10 mg total) by mouth as needed for migraine. May repeat in 2 hours if needed 05/31/18   Penumalli, Earlean Polka, MD  verapamil (CALAN-SR) 240 MG CR tablet Take 1 tablet (240 mg total) by mouth daily. 10/08/17 05/19/18  McLean-Scocuzza, Nino Glow, MD      PHYSICAL EXAMINATION:   VITAL SIGNS: Blood pressure (!) 124/94, pulse 93, temperature 99.7 F (37.6 C), temperature source Oral, resp. rate 14, height 5\' 7"  (1.702 m), weight 68 kg, SpO2 95 %.  GENERAL:  63 y.o.-year-old patient lying in the bed with no acute distress.  EYES: Pupils equal, round, reactive to light and accommodation. No scleral icterus. Extraocular muscles intact.  HEENT: Head atraumatic, normocephalic. Oropharynx and nasopharynx clear.   NECK:  Supple, no jugular venous distention. No thyroid enlargement, no tenderness.  LUNGS: Normal breath sounds bilaterally, no wheezing, rales,rhonchi or crepitation. No use of accessory muscles of respiration.  CARDIOVASCULAR: S1, S2 normal. No murmurs, rubs, or gallops.  ABDOMEN: Soft, nontender, nondistended. Bowel sounds present. No organomegaly or mass.  EXTREMITIES: No pedal edema, cyanosis, or clubbing.  NEUROLOGIC: Cranial nerves II through XII are intact. Muscle strength 5/5 in all extremities. Sensation intact. Gait not checked.  PSYCHIATRIC: The patient is alert and oriented x 3.  SKIN: No obvious rash, lesion, or ulcer.   LABORATORY PANEL:   CBC Recent Labs  Lab 06/12/18 0647  WBC 8.8  HGB 13.9  HCT 42.4  PLT 155  MCV 102.7*  MCH 33.7  MCHC  32.8  RDW 12.8  LYMPHSABS 1.3  MONOABS 0.4  EOSABS 0.1  BASOSABS 0.0   ------------------------------------------------------------------------------------------------------------------  Chemistries  Recent Labs  Lab 06/12/18 0647  NA 141  K 3.5  CL 105  CO2 22  GLUCOSE 128*  BUN 16  CREATININE 1.01*  CALCIUM 9.2  AST 26  ALT 13  ALKPHOS 48  BILITOT 0.3   ------------------------------------------------------------------------------------------------------------------ estimated creatinine clearance is 56.9 mL/min (A) (by C-G formula based on SCr of 1.01 mg/dL (H)). ------------------------------------------------------------------------------------------------------------------ No results for input(s): TSH, T4TOTAL, T3FREE, THYROIDAB in the last 72 hours.  Invalid input(s): FREET3   Coagulation profile No results for input(s): INR, PROTIME in the last 168 hours. ------------------------------------------------------------------------------------------------------------------- No results for input(s): DDIMER in the last 72  hours. -------------------------------------------------------------------------------------------------------------------  Cardiac Enzymes Recent Labs  Lab 06/12/18 0647 06/12/18 1045  TROPONINI 0.05* 0.56*   ------------------------------------------------------------------------------------------------------------------ Invalid input(s): POCBNP  ---------------------------------------------------------------------------------------------------------------  Urinalysis    Component Value Date/Time   COLORURINE YELLOW (A) 06/12/2018 0647   APPEARANCEUR CLOUDY (A) 06/12/2018 0647   APPEARANCEUR Clear 08/05/2017 0836   LABSPEC 1.014 06/12/2018 0647   LABSPEC 1.011 07/01/2012 1751   PHURINE 7.0 06/12/2018 0647   GLUCOSEU NEGATIVE 06/12/2018 0647   GLUCOSEU Negative 07/01/2012 1751   HGBUR MODERATE (A) 06/12/2018 0647   BILIRUBINUR NEGATIVE 06/12/2018 0647   BILIRUBINUR Negative 08/05/2017 0836   BILIRUBINUR Negative 07/01/2012 1751   KETONESUR 5 (A) 06/12/2018 0647   PROTEINUR NEGATIVE 06/12/2018 0647   NITRITE NEGATIVE 06/12/2018 0647   LEUKOCYTESUR NEGATIVE 06/12/2018 0647   LEUKOCYTESUR Negative 07/01/2012 1751     RADIOLOGY: Ct Head Wo Contrast  Result Date: 06/12/2018 CLINICAL DATA:  Per RN note in pt chart "Pt arrived via EMS from home after she had two seizures this morning. Hx of the same. Pt does take Depakote with no known missed doses. First seizure was several hours ago and pt refused transport by EMS .*comment was truncated*Cannot find appropriate structured reason for exam - see REASON FOR EXAM (FREE TEXT) seizure EXAM: CT HEAD WITHOUT CONTRAST TECHNIQUE: Contiguous axial images were obtained from the base of the skull through the vertex without intravenous contrast. COMPARISON:  Brain MRI 11/29/2017 FINDINGS: Brain: No acute intracranial hemorrhage. No focal mass lesion. No CT evidence of acute infarction. No midline shift or mass effect. No hydrocephalus. Basilar  cisterns are patent. Vascular: No hyperdense vessel or unexpected calcification. Skull: Normal. Negative for fracture or focal lesion. Sinuses/Orbits: Paranasal sinuses and mastoid air cells are clear. Orbits are clear. Other: None. IMPRESSION: No acute intracranial findings Electronically Signed   By: Suzy Bouchard M.D.   On: 06/12/2018 08:09   Dg Chest Port 1 View  Result Date: 06/12/2018 CLINICAL DATA:  Encounter for multiple seizures onset this morning. Patient denies SOB, CP, cough or fever. Hx HTN, GERD, COPD. Current smoker.seizure EXAM: PORTABLE CHEST 1 VIEW COMPARISON:  11/01/2017 FINDINGS: Normal mediastinum and cardiac silhouette. Chronic central bronchitic markings. Normal pulmonary vasculature. No effusion, infiltrate, or pneumothorax. IMPRESSION: No acute findings.  Chronic bronchitic markings. Electronically Signed   By: Suzy Bouchard M.D.   On: 06/12/2018 07:35    EKG: Orders placed or performed during the hospital encounter of 06/12/18  . ED EKG  . ED EKG  . ED EKG  . ED EKG    IMPRESSION AND PLAN:  *Seizure disorder Patient had 2 seizure episodes at home. She is compliant with her meds and no recent change in the dose also. This seems to be breakthrough seizures. I will check  Depakote level.  Continue the same dose over here but will give IV Ativan as needed. Neurology consult is called in for further management.(Dr. Doy Mince) CT head is negative. Patient had 2-3 falls at home secondary to seizure episodes. She is able to walk fine in the room from stretcher to the bathroom without any difficulty.  Denies any localized pain.  *Elevated troponin This could be secondary to seizures. No significant EKG changes.  Has some ST depression with high volume QRS complex it seems to be reciprocal. We will follow serial troponin and monitor on telemetry.  Currently heparin drip is not started but if troponin rises further we might started. I have contacted cardiologist for  consult. (Dr. Nehemiah Massed)  *Headache, migraine We will give Fioricet 1 dose over here.  *Hyperlipidemia Continue simvastatin.  *Active smoking Counseled to quit smoking for 4 minutes and offered nicotine patch.  All the records are reviewed and case discussed with ED provider. Management plans discussed with the patient, family and they are in agreement.  CODE STATUS: Full code.    TOTAL TIME TAKING CARE OF THIS PATIENT: 45 minutes.  I spoke to patient's husband on the phone.  Vaughan Basta M.D on 06/12/2018   Between 7am to 6pm - Pager - [phone]  After 6pm go to [email or web address] - password [email or web address] [facility]  Office  [phone]  CC: Primary care physician; Volney American, PA-C   Note: This dictation was prepared with Dragon dictation along with smaller phrase technology. Any transcriptional errors that result from this process are unintentional.

## 2018-06-12 NOTE — ED Notes (Signed)
Pt resting more quietly at this time. Provided for comfort and safety. Pt informed of pending scans and xray. Pt verbalizes understanding the importance of her following directions and staying calm when she goes to radiology. MD aware. Instructed to hold ativan until needed.

## 2018-06-12 NOTE — Progress Notes (Signed)
Family Meeting Note  Advance Directive:yes  Today a meeting took place with the Patient.  The following clinical team members were present during this meeting:MD  The following were discussed:Patient's diagnosis: Seizures, elevated troponin, Patient's progosis: Unable to determine and Goals for treatment: Full Code  Additional follow-up to be provided: Cardiology and neurology  Time spent during discussion:20 minutes  Vaughan Basta, MD

## 2018-06-12 NOTE — ED Notes (Signed)
Patient was found retching in the soiled linen cart. Patient was walked back to the stretcher. Patient has a steady gait. Patient was given tissue and an emesis bag per request.

## 2018-06-12 NOTE — ED Triage Notes (Addendum)
Pt arrived via EMS from home after she had two seizures this morning. Hx of the same. Pt does take Depakote with no known missed doses. First seizure was several hours ago and pt refused transport by EMS who had been called. 2nd seizure was approx 30 min ago. Pt husband reports he heard her hit the floor per EMS. Pt currently uncooperative and yelling loudly. 2mg  of versed given IM in left upper arm by EMS prior to arrival. FSBS 164 No obvious injury from fall during seizure at home. Pt denies pain at this time. Seizure pads in place on stretcher.

## 2018-06-12 NOTE — ED Provider Notes (Signed)
-----------------------------------------   2:20 PM on 06/12/2018 -----------------------------------------  I took over care on this patient from Dr. Beather Arbour.  The patient had presented with seizures and at the time of discharge was pending CT head and the remainder of her lab work-up.  CT head was negative.  Labs revealed low valproic acid level, and multiple positive findings on her UDS.  Additionally her initial troponin was 0.05.  I obtained a repeat after 3 hours and it had risen to 0.56.  On reassessment, the patient has no chest pain or shortness of breath.  Repeat EKG shows ST segment flattening and possibly minimal ST depression in the inferior and lateral leads, although the appearance is not significantly changed when compared to EKG of earlier today.  The elevated troponin may be related to the patient's seizures, and she has no CAD history.  However given the rising troponin she will need admission.  At this time I do not think there is indication for heparin infusion or other acute cardiac intervention.  I informed the patient of the findings and the plan of care.  She expressed agreement.  I signed the patient out to the hospitalist Dr. Anselm Jungling.   Arta Silence, MD 06/12/18 1440

## 2018-06-12 NOTE — Consult Note (Signed)
Reason for Consult:Seizures Referring Physician: Anselm Jungling  CC: Seizures  HPI: Kayla Shah is an 63 y.o. female with a history of seizures on Depakote and Neurontin who presents with breakthrough seizures.  Followed by Dr. Leta Baptist.  Patient reports being compliant with medications although probably unable to take medications this morning.  Patient had seizure this morning and refused transport.  Was then noted by husband to have another seizure and was brought in for evaluation.    Past Medical History:  Diagnosis Date  . Allergy   . Anxiety    agoraphobia   . Arthritis   . Chicken pox   . COPD (chronic obstructive pulmonary disease) ([place])   . Depression   . GERD (gastroesophageal reflux disease)   . Headache   . History of blood transfusion    1984  . Hyperlipidemia   . Hypertension   . Migraine   . Seizure ([place])    was on keppra    Past Surgical History:  Procedure Laterality Date  . ABDOMINAL HYSTERECTOMY  1984   endometriosis  . APPENDECTOMY    . COLONOSCOPY WITH PROPOFOL N/A 03/26/2017   Procedure: COLONOSCOPY WITH PROPOFOL;  Surgeon: Jonathon Bellows, MD;  Location: [facility] ENDOSCOPY;  Service: Gastroenterology;  Laterality: N/A;  . WISDOM TOOTH EXTRACTION      Family History  Problem Relation Age of Onset  . Stroke Mother   . Depression Mother   . Arthritis Mother   . Heart disease Father   . Alcohol abuse Father   . Diabetes Father   . Osteoporosis Sister   . Kidney disease Sister   . Arthritis Sister   . Arthritis Maternal Grandmother   . Heart disease Maternal Grandmother   . Hyperlipidemia Maternal Grandmother   . Diabetes Paternal Grandmother   . Breast cancer Neg Hx     Social History:  reports that she has been smoking cigarettes. She has a 45.00 pack-year smoking history. She has never used smokeless tobacco. She reports current drug use. Drug: Marijuana. She reports that she does not drink alcohol.  Allergies  Allergen Reactions  . Paroxetine  Hcl Other (See Comments)    Pt. Becomes very aggressive with mood swings.  . Codeine Nausea And Vomiting  . Fluoxetine Anxiety  . Paroxetine Anxiety    Medications:  I have reviewed the patient's current medications. Prior to Admission:  Medications Prior to Admission  Medication Sig Dispense Refill Last Dose  . Calcium-Magnesium-Vitamin D (CALCIUM 500 PO) Take 1 tablet by mouth daily.    06/11/2018 at 0800  . cholecalciferol (VITAMIN D) 1000 units tablet Take 1,000 Units by mouth daily.   06/11/2018 at 0800  . divalproex (DEPAKOTE) 500 MG DR tablet Take 1 tablet (500 mg total) by mouth 2 (two) times daily. 180 tablet 4 06/11/2018 at 2000  . gabapentin (NEURONTIN) 300 MG capsule Take 1 capsule (300 mg total) by mouth 2 (two) times daily. 60 capsule 11 06/11/2018 at 2000  . pantoprazole (PROTONIX) 40 MG tablet Take 1 tablet (40 mg total) by mouth daily. 90 tablet 3 06/11/2018 at 0800  . simvastatin (ZOCOR) 20 MG tablet TAKE 1 TABLET BY MOUTH DAILY 90 tablet 1 06/11/2018 at 1800  . dimenhyDRINATE (DRAMAMINE) 50 MG tablet Take 50 mg by mouth daily.   prn at prn  . hydrOXYzine (ATARAX/VISTARIL) 25 MG tablet Take 1 tablet (25 mg total) by mouth 3 (three) times daily as needed. (Patient not taking: Reported on 05/19/2018) 90 tablet 1 Not Taking  .  rizatriptan (MAXALT-MLT) 10 MG disintegrating tablet Take 1 tablet (10 mg total) by mouth as needed for migraine. May repeat in 2 hours if needed 9 tablet 11 prn at prn  . verapamil (CALAN-SR) 240 MG CR tablet Take 1 tablet (240 mg total) by mouth daily. 90 tablet 3 Taking   Scheduled: . cholecalciferol  1,000 Units Oral Daily  . dimenhyDRINATE  50 mg Oral Daily  . divalproex  500 mg Oral BID  . gabapentin  300 mg Oral BID  . heparin  5,000 Units Subcutaneous Q8H  . multivitamin with minerals  1 tablet Oral Daily  . nicotine  21 mg Transdermal Daily  . pantoprazole  40 mg Oral Daily  . simvastatin  20 mg Oral Daily  . verapamil  240 mg Oral Daily     ROS: Patient not cooperating for ROS  Physical Examination: Blood pressure 116/64, pulse 83, temperature 98.6 F (37 C), temperature source Oral, resp. rate 20, height 5\' 7"  (1.702 m), weight 68 kg, SpO2 100 %.  HEENT-  Normocephalic, no lesions, without obvious abnormality.  Normal external eye and conjunctiva.  Normal TM's bilaterally.  Normal auditory canals and external ears. Normal external nose, mucus membranes and septum.  Normal pharynx. Cardiovascular- S1, S2 normal, pulses palpable throughout   Lungs- chest clear, no wheezing, rales, normal symmetric air entry Abdomen- soft, non-tender; bowel sounds normal; no masses,  no organomegaly Extremities- no edema Lymph-no adenopathy palpable Musculoskeletal-no joint tenderness, deformity or swelling Skin-warm and dry, no hyperpigmentation, vitiligo, or suspicious lesions  Neurological Examination   Mental Status: Lethargic, oriented, thought content appropriate.  Speech fluent without evidence of aphasia.  Able to follow 3 step commands without difficulty. Cranial Nerves: II: Discs flat bilaterally; Visual fields grossly normal, pupils equal, round, reactive to light and accommodation III,IV, VI: ptosis not present, extra-ocular motions intact bilaterally V,VII: smile symmetric, facial light touch sensation normal bilaterally VIII: hearing normal bilaterally IX,X: gag reflex present XI: bilateral shoulder shrug XII: midline tongue extension Motor: Right : Upper extremity   5/5    Left:     Upper extremity   5/5  Lower extremity   5/5     Lower extremity   5/5 Tone and bulk:normal tone throughout; no atrophy noted Sensory: Pinprick and light touch intact throughout, bilaterally Deep Tendon Reflexes: Symmetric throughout Plantars: Right: downgoing   Left: downgoing Cerebellar: Normal finger-to-nose testing bilaterally Gait: not tested due to safety concerns    Laboratory Studies:   Basic Metabolic Panel: Recent Labs   Lab 06/12/18 0647  NA 141  K 3.5  CL 105  CO2 22  GLUCOSE 128*  BUN 16  CREATININE 1.01*  CALCIUM 9.2    Liver Function Tests: Recent Labs  Lab 06/12/18 0647  AST 26  ALT 13  ALKPHOS 48  BILITOT 0.3  PROT 8.0  ALBUMIN 4.0   No results for input(s): LIPASE, AMYLASE in the last 168 hours. No results for input(s): AMMONIA in the last 168 hours.  CBC: Recent Labs  Lab 06/12/18 0647  WBC 8.8  NEUTROABS 7.0  HGB 13.9  HCT 42.4  MCV 102.7*  PLT 155    Cardiac Enzymes: Recent Labs  Lab 06/12/18 0647 06/12/18 1045 06/12/18 1631  TROPONINI 0.05* 0.56* 1.90*    BNP: Invalid input(s): POCBNP  CBG: Recent Labs  Lab 06/12/18 0645  GLUCAP 133*    Microbiology: Results for orders placed or performed during the hospital encounter of 06/12/18  SARS Coronavirus 2 (CEPHEID - Performed  in [place] lab), Hosp Order     Status: None   Collection Time: 06/12/18 11:50 AM  Result Value Ref Range Status   SARS Coronavirus 2 NEGATIVE NEGATIVE Final    Comment: (NOTE) If result is NEGATIVE SARS-CoV-2 target nucleic acids are NOT DETECTED. The SARS-CoV-2 RNA is generally detectable in upper and lower  respiratory specimens during the acute phase of infection. The lowest  concentration of SARS-CoV-2 viral copies this assay can detect is 250  copies / mL. A negative result does not preclude SARS-CoV-2 infection  and should not be used as the sole basis for treatment or other  patient management decisions.  A negative result may occur with  improper specimen collection / handling, submission of specimen other  than nasopharyngeal swab, presence of viral mutation(s) within the  areas targeted by this assay, and inadequate number of viral copies  (<250 copies / mL). A negative result must be combined with clinical  observations, patient history, and epidemiological information. If result is POSITIVE SARS-CoV-2 target nucleic acids are DETECTED. The SARS-CoV-2  RNA is generally detectable in upper and lower  respiratory specimens dur ing the acute phase of infection.  Positive  results are indicative of active infection with SARS-CoV-2.  Clinical  correlation with patient history and other diagnostic information is  necessary to determine patient infection status.  Positive results do  not rule out bacterial infection or co-infection with other viruses. If result is PRESUMPTIVE POSTIVE SARS-CoV-2 nucleic acids MAY BE PRESENT.   A presumptive positive result was obtained on the submitted specimen  and confirmed on repeat testing.  While 2019 novel coronavirus  (SARS-CoV-2) nucleic acids may be present in the submitted sample  additional confirmatory testing may be necessary for epidemiological  and / or clinical management purposes  to differentiate between  SARS-CoV-2 and other Sarbecovirus currently known to infect humans.  If clinically indicated additional testing with an alternate test  methodology [phone]) is advised. The SARS-CoV-2 RNA is generally  detectable in upper and lower respiratory sp ecimens during the acute  phase of infection. The expected result is Negative. Fact Sheet for Patients:  StrictlyIdeas.no Fact Sheet for Healthcare Providers: BankingDealers.co.za This test is not yet approved or cleared by the [place] FDA and has been authorized for detection and/or diagnosis of SARS-CoV-2 by FDA under an Emergency Use Authorization (EUA).  This EUA will remain in effect (meaning this test can be used) for the duration of the COVID-19 declaration under Section 564(b)(1) of the Act, 21 U.S.C. section 360bbb-3(b)(1), unless the authorization is terminated or revoked sooner. Performed at [facility], [address] [place], [place] [address]     Coagulation Studies: No results for input(s): LABPROT, INR in the last 72 hours.  Urinalysis:  Recent Labs  Lab  06/12/18 0647  COLORURINE YELLOW*  LABSPEC 1.014  PHURINE 7.0  GLUCOSEU NEGATIVE  HGBUR MODERATE*  BILIRUBINUR NEGATIVE  KETONESUR 5*  PROTEINUR NEGATIVE  NITRITE NEGATIVE  LEUKOCYTESUR NEGATIVE    Lipid Panel:     Component Value Date/Time   CHOL 152 06/12/2018 1631   CHOL 189 06/22/2015 1053   TRIG 47 06/12/2018 1631   HDL 73 06/12/2018 1631   HDL 54 06/22/2015 1053   CHOLHDL 2.1 06/12/2018 1631   VLDL 9 06/12/2018 1631   LDLCALC 70 06/12/2018 1631   LDLCALC 111 (H) 06/22/2015 1053    HgbA1C:  Lab Results  Component Value Date   HGBA1C 5.6 06/12/2018  Urine Drug Screen:      Component Value Date/Time   LABOPIA NONE DETECTED 06/12/2018 0647   COCAINSCRNUR NONE DETECTED 06/12/2018 0647   LABBENZ POSITIVE (A) 06/12/2018 0647   AMPHETMU NONE DETECTED 06/12/2018 0647   THCU POSITIVE (A) 06/12/2018 0647   LABBARB POSITIVE (A) 06/12/2018 0647    Alcohol Level:  Recent Labs  Lab 06/12/18 0940  ETH <10    Imaging: Ct Head Wo Contrast  Result Date: 06/12/2018 CLINICAL DATA:  Per RN note in pt chart "Pt arrived via EMS from home after she had two seizures this morning. Hx of the same. Pt does take Depakote with no known missed doses. First seizure was several hours ago and pt refused transport by EMS .*comment was truncated*Cannot find appropriate structured reason for exam - see REASON FOR EXAM (FREE TEXT) seizure EXAM: CT HEAD WITHOUT CONTRAST TECHNIQUE: Contiguous axial images were obtained from the base of the skull through the vertex without intravenous contrast. COMPARISON:  Brain MRI 11/29/2017 FINDINGS: Brain: No acute intracranial hemorrhage. No focal mass lesion. No CT evidence of acute infarction. No midline shift or mass effect. No hydrocephalus. Basilar cisterns are patent. Vascular: No hyperdense vessel or unexpected calcification. Skull: Normal. Negative for fracture or focal lesion. Sinuses/Orbits: Paranasal sinuses and mastoid air cells are clear. Orbits  are clear. Other: None. IMPRESSION: No acute intracranial findings Electronically Signed   By: Suzy Bouchard M.D.   On: 06/12/2018 08:09   Dg Chest Port 1 View  Result Date: 06/12/2018 CLINICAL DATA:  Encounter for multiple seizures onset this morning. Patient denies SOB, CP, cough or fever. Hx HTN, GERD, COPD. Current smoker.seizure EXAM: PORTABLE CHEST 1 VIEW COMPARISON:  11/01/2017 FINDINGS: Normal mediastinum and cardiac silhouette. Chronic central bronchitic markings. Normal pulmonary vasculature. No effusion, infiltrate, or pneumothorax. IMPRESSION: No acute findings.  Chronic bronchitic markings. Electronically Signed   By: Suzy Bouchard M.D.   On: 06/12/2018 07:35     Assessment/Plan: 63 year old female with a history of seizures on Depakote and Neurontin presenting with breakthrough seizures.  Head CT reviewed and unremarkable.  Depakote level of 33.  Neurological examination is nonfocal.    Recommendations: 1. Depacon 500mg  IV now 2. Start maintenance of 750mg  Depakote BID 3. Depakote level in AM 4. Seizure precautions 5. Patient unable to drive, operate heavy machinery, perform activities at heights and participate in water activities until release by outpatient physician.  Alexis Goodell, MD Neurology [phone] 06/12/2018, 6:16 PM

## 2018-06-12 NOTE — Consult Note (Signed)
ANTICOAGULATION CONSULT NOTE - Initial Consult  Pharmacy Consult for Heparin Drip Indication: chest pain/ACS  Allergies  Allergen Reactions  . Paroxetine Hcl Other (See Comments)    Pt. Becomes very aggressive with mood swings.  . Codeine Nausea And Vomiting  . Fluoxetine Anxiety  . Paroxetine Anxiety    Patient Measurements: Height: 5\' 7"  (170.2 cm) Weight: 150 lb (68 kg) IBW/kg (Calculated) : 61.6 Heparin Dosing Weight: 68 kg  Vital Signs: Temp: 98.6 F (37 C) (05/09 2032) Temp Source: Oral (05/09 2032) BP: 106/67 (05/09 2032) Pulse Rate: 64 (05/09 2032)  Labs: Recent Labs    06/12/18 0647 06/12/18 1045 06/12/18 1631  HGB 13.9  --   --   HCT 42.4  --   --   PLT 155  --   --   CREATININE 1.01*  --   --   TROPONINI 0.05* 0.56* 1.90*    Estimated Creatinine Clearance: 56.9 mL/min (A) (by C-G formula based on SCr of 1.01 mg/dL (H)).   Medical History: Past Medical History:  Diagnosis Date  . Allergy   . Anxiety    agoraphobia   . Arthritis   . Chicken pox   . COPD (chronic obstructive pulmonary disease) ([place])   . Depression   . GERD (gastroesophageal reflux disease)   . Headache   . History of blood transfusion    1984  . Hyperlipidemia   . Hypertension   . Migraine   . Seizure ([place])    was on keppra    Medications:  Medications Prior to Admission  Medication Sig Dispense Refill Last Dose  . Calcium-Magnesium-Vitamin D (CALCIUM 500 PO) Take 1 tablet by mouth daily.    06/11/2018 at 0800  . cholecalciferol (VITAMIN D) 1000 units tablet Take 1,000 Units by mouth daily.   06/11/2018 at 0800  . divalproex (DEPAKOTE) 500 MG DR tablet Take 1 tablet (500 mg total) by mouth 2 (two) times daily. 180 tablet 4 06/11/2018 at 2000  . gabapentin (NEURONTIN) 300 MG capsule Take 1 capsule (300 mg total) by mouth 2 (two) times daily. 60 capsule 11 06/11/2018 at 2000  . pantoprazole (PROTONIX) 40 MG tablet Take 1 tablet (40 mg total) by mouth daily. 90 tablet 3 06/11/2018 at  0800  . simvastatin (ZOCOR) 20 MG tablet TAKE 1 TABLET BY MOUTH DAILY 90 tablet 1 06/11/2018 at 1800  . dimenhyDRINATE (DRAMAMINE) 50 MG tablet Take 50 mg by mouth daily.   prn at prn  . hydrOXYzine (ATARAX/VISTARIL) 25 MG tablet Take 1 tablet (25 mg total) by mouth 3 (three) times daily as needed. (Patient not taking: Reported on 05/19/2018) 90 tablet 1 Not Taking  . rizatriptan (MAXALT-MLT) 10 MG disintegrating tablet Take 1 tablet (10 mg total) by mouth as needed for migraine. May repeat in 2 hours if needed 9 tablet 11 prn at prn  . verapamil (CALAN-SR) 240 MG CR tablet Take 1 tablet (240 mg total) by mouth daily. 90 tablet 3 Taking   Scheduled:  . cholecalciferol  1,000 Units Oral Daily  . dimenhyDRINATE  50 mg Oral Daily  . divalproex  750 mg Oral BID  . gabapentin  300 mg Oral BID  . heparin  4,000 Units Intravenous Once  . multivitamin with minerals  1 tablet Oral Daily  . nicotine  21 mg Transdermal Daily  . pantoprazole  40 mg Oral Daily  . simvastatin  20 mg Oral Daily  . verapamil  240 mg Oral Daily   Infusions:  .  sodium chloride 250 mL (06/12/18 2041)  . heparin    . valproate sodium 500 mg (06/12/18 2042)   PRN: sodium chloride, butalbital-acetaminophen-caffeine, docusate sodium, LORazepam, ondansetron (ZOFRAN) IV  Assessment: Patient's troponin level has increased from 0.05 on admittance to 1.90. Patient has no anticoagulant PTA and has not received heparin while admitted. Pharmacy has been consulted to initiate Heparin drip on patient for NSTEMI.   Goal of Therapy:  Heparin level 0.3-0.7 units/ml Monitor platelets by anticoagulation protocol: Yes   Plan:  Give 4000 units bolus x 1 Start heparin infusion at 850 units/hr Check anti-Xa level in 6 hours and daily while on heparin Continue to monitor H&H and platelets  Natarsha Hurwitz A Kervens [facility] 06/12/2018,8:49 PM

## 2018-06-12 NOTE — ED Provider Notes (Signed)
[facility] Emergency Department Provider Note   ____________________________________________   First MD Initiated Contact with Patient 06/12/18 [phone]     (approximate)  I have reviewed the triage vital signs and the nursing notes.   HISTORY  Chief Complaint Seizures  Level V caveat: Limited by postictal state  HPI Kayla Shah is a 63 y.o. female brought to the ED via EMS from home status post seizure.  Patient has a history of seizure disorder, on Depakote.  EMS reports this is their second visit for seizure overnight.  Initially called to the residence 2 hours ago for seizure; patient refused transport at that time.  Husband called again after he heard a thump on the floor.  When EMS arrived, patient was in the bed.  2 mg IM Versed given en route for patient combative.  Has been told EMS patient may have had marijuana exposure.  Rest of history is limited secondary to patient's postictal state.       Past Medical History:  Diagnosis Date  . Allergy   . Anxiety    agoraphobia   . Arthritis   . Chicken pox   . COPD (chronic obstructive pulmonary disease) ([place])   . Depression   . GERD (gastroesophageal reflux disease)   . Headache   . History of blood transfusion    1984  . Hyperlipidemia   . Hypertension   . Migraine   . Seizure [facility])    was on keppra    Patient Active Problem List   Diagnosis Date Noted  . Lung nodule 10/08/2017  . Anxiety and depression 07/30/2017  . Agoraphobia 07/30/2017  . CAD (coronary artery disease) 07/30/2017  . Hot flashes due to surgical menopause 07/30/2017  . Insomnia 07/30/2017  . Osteoporosis 02/27/2016  . Personal history of tobacco use, presenting hazards to health 10/23/2015  . Primary osteoarthritis of left elbow 10/11/2015  . Migraine 06/07/2015  . Seizures ([place]) 06/07/2015  . Smoker 06/07/2015  . Gastroesophageal reflux disease without esophagitis 06/07/2015  . COPD (chronic obstructive  pulmonary disease) with chronic bronchitis ([place]) 08/14/2014  . Essential (primary) hypertension 08/14/2014  . Pure hypercholesterolemia 08/14/2014  . Abnormal brain scan 08/29/2013  . Depression 08/29/2013    Past Surgical History:  Procedure Laterality Date  . ABDOMINAL HYSTERECTOMY  1984   endometriosis  . APPENDECTOMY    . COLONOSCOPY WITH PROPOFOL N/A 03/26/2017   Procedure: COLONOSCOPY WITH PROPOFOL;  Surgeon: Jonathon Bellows, MD;  Location: [facility] ENDOSCOPY;  Service: Gastroenterology;  Laterality: N/A;  . WISDOM TOOTH EXTRACTION      Prior to Admission medications   Medication Sig Start Date End Date Taking? Authorizing Provider  aspirin EC 81 MG tablet Take 81 mg by mouth daily.    [provider]  Calcium-Magnesium-Vitamin D (CALCIUM 500 PO) Take by mouth.    [provider]  cholecalciferol (VITAMIN D) 1000 units tablet Take 1,000 Units by mouth daily.    [provider]  dimenhyDRINATE (DRAMAMINE) 50 MG tablet Take 50 mg by mouth daily.    [provider]  divalproex (DEPAKOTE) 500 MG DR tablet Take 1 tablet (500 mg total) by mouth 2 (two) times daily. 05/31/18   Penumalli, Earlean Polka, MD  gabapentin (NEURONTIN) 300 MG capsule Take 1 capsule (300 mg total) by mouth 2 (two) times daily. 10/27/17   McLean-Scocuzza, Nino Glow, MD  hydrOXYzine (ATARAX/VISTARIL) 25 MG tablet Take 1 tablet (25 mg total) by mouth 3 (three) times daily as needed.  Patient not taking: Reported on 05/19/2018 03/10/18   Volney American, PA-C  ibuprofen (ADVIL,MOTRIN) 800 MG tablet AS NEEDED 04/13/18   [provider]  pantoprazole (PROTONIX) 40 MG tablet Take 1 tablet (40 mg total) by mouth daily. 06/07/15   Luciana Axe, NP  rizatriptan (MAXALT-MLT) 10 MG disintegrating tablet Take 1 tablet (10 mg total) by mouth as needed for migraine. May repeat in 2 hours if needed 05/31/18   Penumalli, Earlean Polka, MD  simvastatin (ZOCOR) 20 MG tablet TAKE 1 TABLET BY MOUTH DAILY  05/14/18   Volney American, PA-C  verapamil (CALAN-SR) 240 MG CR tablet Take 1 tablet (240 mg total) by mouth daily. 10/08/17 05/19/18  McLean-Scocuzza, Nino Glow, MD    Allergies Paroxetine hcl; Codeine; Fluoxetine; and Paroxetine  Family History  Problem Relation Age of Onset  . Stroke Mother   . Depression Mother   . Arthritis Mother   . Heart disease Father   . Alcohol abuse Father   . Diabetes Father   . Osteoporosis Sister   . Kidney disease Sister   . Arthritis Sister   . Arthritis Maternal Grandmother   . Heart disease Maternal Grandmother   . Hyperlipidemia Maternal Grandmother   . Diabetes Paternal Grandmother   . Breast cancer Neg Hx     Social History Social History   Tobacco Use  . Smoking status: Current Every Day Smoker    Packs/day: 1.00    Years: 45.00    Pack years: 45.00    Types: Cigarettes  . Smokeless tobacco: Never Used  Substance Use Topics  . Alcohol use: No  . Drug use: Yes    Types: Marijuana    Review of Systems  Constitutional: No fever/chills Eyes: No visual changes. ENT: No sore throat. Cardiovascular: Denies chest pain. Respiratory: Denies shortness of breath. Gastrointestinal: No abdominal pain.  No nausea, no vomiting.  No diarrhea.  No constipation. Genitourinary: Negative for dysuria. Musculoskeletal: Negative for back pain. Skin: Negative for rash. Neurological: Positive for seizure.  Negative for headaches, focal weakness or numbness.  10 point review of systems limited secondary to patient's postictal state. ____________________________________________   PHYSICAL EXAM:  VITAL SIGNS: ED Triage Vitals  Enc Vitals Group     BP      Pulse      Resp      Temp      Temp src      SpO2      Weight      Height      Head Circumference      Peak Flow      Pain Score      Pain Loc      Pain Edu?      Excl. in [place]?     Constitutional: Alert and oriented.  Confused appearing and in mild acute distress. Eyes:  Conjunctivae are normal. PERRL. EOMI. Head: Atraumatic. Nose: Atraumatic. Mouth/Throat: Mucous membranes are moist.  No dental malocclusion.  Did not bite tongue.  Neck: No stridor.  No cervical spine tenderness to palpation. Cardiovascular: Normal rate, regular rhythm. Grossly normal heart sounds.  Good peripheral circulation. Respiratory: Normal respiratory effort.  No retractions. Lungs CTAB. Gastrointestinal: Soft and nontender. No distention. No abdominal bruits. No CVA tenderness. Genitourinary: No urinary incontinence. Musculoskeletal: No lower extremity tenderness nor edema.  No joint effusions. Neurologic: Postictal state.  Normal speech and language. No gross focal neurologic deficits are appreciated. MAEx4. Skin:  Skin is warm, dry and intact. No  rash noted. Psychiatric: Mood and affect are combative. Speech and behavior are normal.  ____________________________________________   LABS (all labs ordered are listed, but only abnormal results are displayed)  Labs Reviewed  GLUCOSE, CAPILLARY - Abnormal; Notable for the following components:      Result Value   Glucose-Capillary 133 (*)    All other components within normal limits  CBC WITH DIFFERENTIAL/PLATELET  COMPREHENSIVE METABOLIC PANEL  ETHANOL  TROPONIN I  URINALYSIS, COMPLETE (UACMP) WITH MICROSCOPIC  URINE DRUG SCREEN, QUALITATIVE ([facility] ONLY)  VALPROIC ACID LEVEL   ____________________________________________  EKG  ED ECG REPORT I, Aamori Mcmasters J, the attending physician, personally viewed and interpreted this ECG.   Date: 06/12/2018  EKG Time: 0640  Rate: 87  Rhythm: normal EKG, normal sinus rhythm  Axis: Normal  Intervals:none  ST&T Change: Nonspecific  ____________________________________________  RADIOLOGY  ED MD interpretation: Pending  Official radiology report(s): No results found.  ____________________________________________   PROCEDURES  Procedure(s) performed (including Critical  Care):  Procedures   ____________________________________________   INITIAL IMPRESSION / ASSESSMENT AND PLAN / ED COURSE  As part of my medical decision making, I reviewed the following data within the [place] notes reviewed and incorporated, Labs reviewed, EKG interpreted, Old chart reviewed, Radiograph reviewed and Notes from prior ED visits     Kayla Shah was evaluated in Emergency Department on 06/12/2018 for the symptoms described in the history of present illness. She was evaluated in the context of the global COVID-19 pandemic, which necessitated consideration that the patient might be at risk for infection with the SARS-CoV-2 virus that causes COVID-19. Institutional protocols and algorithms that pertain to the evaluation of patients at risk for COVID-19 are in a state of rapid change based on information released by regulatory bodies including the CDC and federal and state organizations. These policies and algorithms were followed during the patient's care in the ED.   63 year old female with seizures on Depakote who presents status post second seizure.  Presents combative in postictal state.  Differential diagnosis includes but is not limited to seizure, ICH, subtherapeutic Depakote level, infectious, metabolic etiologies, etc.  I personally reviewed patient's records and see that she had a recent visit with her neurologist who is continuing her on Depakote 500 mg twice daily.  Will obtain lab work, CT head, 1 mg IV Ativan for combative behavior and reassess.  Clinical Course as of Jun 12 711  Sat Jun 12, 2018  0702 Care transferred to Dr. Cherylann Banas pending lab, urine and CT scan. Results and disposition pending. Of note, patient currently does not require IV Ativan as she is calm and settled with some warm blankets.   [JS]    Clinical Course User Index [JS] Paulette Blanch, MD     ____________________________________________   FINAL CLINICAL  IMPRESSION(S) / ED DIAGNOSES  Final diagnoses:  Seizure [facility])     ED Discharge Orders    None       Note:  This document was prepared using Dragon voice recognition software and may include unintentional dictation errors.   Paulette Blanch, MD 06/12/18 [phone]

## 2018-06-12 NOTE — Progress Notes (Signed)
Reviewed neurology recommendations- give Depakot IV 500 ml now and change doe to 750 mg oral BID- ordered as advised.  Noticed Troponin rise.  Started heparin IV drip for NSTEMI- cardiologist is already aware.

## 2018-06-13 ENCOUNTER — Inpatient Hospital Stay
Admit: 2018-06-13 | Discharge: 2018-06-13 | Disposition: A | Discharge status: Home / Self Care | Payer: Medicare Other | Attending: Internal Medicine | Admitting: Internal Medicine

## 2018-06-13 LAB — CBC
HCT: 38.6 % (ref 36.0–46.0)
Hemoglobin: 13.1 g/dL (ref 12.0–15.0)
MCH: 33.9 pg (ref 26.0–34.0)
MCHC: 33.9 g/dL (ref 30.0–36.0)
MCV: 99.7 fL (ref 80.0–100.0)
Platelets: 148 10*3/uL — ABNORMAL LOW (ref 150–400)
RBC: 3.87 MIL/uL (ref 3.87–5.11)
RDW: 12.7 % (ref 11.5–15.5)
WBC: 8.5 10*3/uL (ref 4.0–10.5)
nRBC: 0 % (ref 0.0–0.2)

## 2018-06-13 LAB — HEPARIN LEVEL (UNFRACTIONATED)
Heparin Unfractionated: 0.81 IU/mL — ABNORMAL HIGH (ref 0.30–0.70)
Heparin Unfractionated: 0.28 IU/mL — ABNORMAL LOW (ref 0.30–0.70)

## 2018-06-13 LAB — BASIC METABOLIC PANEL
Anion gap: 9 (ref 5–15)
BUN: 12 mg/dL (ref 8–23)
CO2: 25 mmol/L (ref 22–32)
Calcium: 8.6 mg/dL — ABNORMAL LOW (ref 8.9–10.3)
Chloride: 107 mmol/L (ref 98–111)
Creatinine, Ser: 0.81 mg/dL (ref 0.44–1.00)
GFR calc Af Amer: >60 mL/min (ref >60)
GFR calc non Af Amer: >60 mL/min (ref >60)
Glucose, Bld: 102 mg/dL — ABNORMAL HIGH (ref 70–99)
Potassium: 2.9 mmol/L — ABNORMAL LOW (ref 3.5–5.1)
Sodium: 141 mmol/L (ref 135–145)

## 2018-06-13 LAB — TROPONIN I: Troponin I: 0.79 ng/mL (ref <0.03)

## 2018-06-13 LAB — VALPROIC ACID LEVEL: Valproic Acid Lvl: 105 ug/mL — ABNORMAL HIGH (ref 50.0–100.0)

## 2018-06-13 LAB — MAGNESIUM: Magnesium: 2.1 mg/dL (ref 1.7–2.4)

## 2018-06-13 MED ORDER — SODIUM CHLORIDE 0.9% FLUSH: 3.0000 mL | Freq: Two times a day (BID) | INTRAVENOUS | Status: DC

## 2018-06-13 MED ORDER — NICOTINE 21 MG/24HR TD PT24: 21.0000 mg | MEDICATED_PATCH | Freq: Every day | TRANSDERMAL | 0 refills | Status: DC

## 2018-06-13 MED ORDER — TOPIRAMATE 50 MG PO TABS: 50.0000 mg | ORAL_TABLET | Freq: Two times a day (BID) | ORAL | 0 refills | Status: DC | PRN

## 2018-06-13 MED ORDER — DIVALPROEX SODIUM 250 MG PO DR TAB: 750.0000 mg | DELAYED_RELEASE_TABLET | Freq: Two times a day (BID) | ORAL | 0 refills | Status: DC

## 2018-06-13 MED ORDER — TOPIRAMATE 25 MG PO TABS
50.0000 mg | ORAL_TABLET | Freq: Once | ORAL | Status: AC
  Administered 2018-06-13: 50 mg via ORAL
  Filled 2018-06-13: qty 2

## 2018-06-13 MED ORDER — POTASSIUM CHLORIDE CRYS ER 20 MEQ PO TBCR
40.0000 meq | EXTENDED_RELEASE_TABLET | ORAL | Status: AC
  Administered 2018-06-13 (×2): 40 meq via ORAL
  Filled 2018-06-13 (×2): qty 2

## 2018-06-13 NOTE — Discharge Summary (Signed)
[facility] NAME: Kayla Shah    MR#:  [id number]  DATE OF BIRTH:  01/14/1956  DATE OF ADMISSION:  06/12/2018 ADMITTING PHYSICIAN: Vaughan Basta, MD  DATE OF DISCHARGE: No discharge date for patient encounter.  PRIMARY CARE PHYSICIAN: Volney American, PA-C    ADMISSION DIAGNOSIS:  Seizure ([place]) [R56.9] Elevated troponin [R79.89]  DISCHARGE DIAGNOSIS:  Principal Problem:   Elevated troponin Active Problems:   Seizures ([place])   SECONDARY DIAGNOSIS:   Past Medical History:  Diagnosis Date  . Allergy   . Anxiety    agoraphobia   . Arthritis   . Chicken pox   . COPD (chronic obstructive pulmonary disease) ([place])   . Depression   . GERD (gastroesophageal reflux disease)   . Headache   . History of blood transfusion    1984  . Hyperlipidemia   . Hypertension   . Migraine   . Seizure ([place])    was on [place]:  *Acute breakthrough Seizure  Stable Patient had 2 seizure episodes at home Neurology did see patient while in house, Depakote was increased to 750 mg p.o. twice daily, CT head negative for any acute process, discussed with neurology on day of discharge, okay for discharge to home with outpatient follow-up with primary care provider and her neurologist in [facility] for reevaluation status post discharge  *Acute elevated troponin Likely secondary to seizures Conservative medical management recommended   *Headache, migraine Treated with Fioricet, Topamax while in house  *Hyperlipidemia Continued simvastatin.  *Active smoking Cessation counseling given while in house  DISCHARGE CONDITIONS:   stable  CONSULTS OBTAINED:  Treatment Team:  Catarina Hartshorn, MD  DRUG ALLERGIES:   Allergies  Allergen Reactions  . Paroxetine Hcl Other (See Comments)    Pt. Becomes very aggressive with mood swings.  . Codeine Nausea And Vomiting  . Fluoxetine Anxiety  .  Paroxetine Anxiety    DISCHARGE MEDICATIONS:   Allergies as of 06/13/2018      Reactions   Paroxetine Hcl Other (See Comments)   Pt. Becomes very aggressive with mood swings.   Codeine Nausea And Vomiting   Fluoxetine Anxiety   Paroxetine Anxiety      Medication List    TAKE these medications   CALCIUM 500 PO Take 1 tablet by mouth daily.   cholecalciferol 1000 units tablet Commonly known as:  VITAMIN D Take 1,000 Units by mouth daily.   dimenhyDRINATE 50 MG tablet Commonly known as:  DRAMAMINE Take 50 mg by mouth daily.   divalproex 250 MG DR tablet Commonly known as:  DEPAKOTE Take 3 tablets (750 mg total) by mouth 2 (two) times daily. What changed:   medication strength how much to take   gabapentin 300 MG capsule Commonly known as:  NEURONTIN Take 1 capsule (300 mg total) by mouth 2 (two) times daily.   hydrOXYzine 25 MG tablet Commonly known as:  ATARAX/VISTARIL Take 1 tablet (25 mg total) by mouth 3 (three) times daily as needed.   nicotine 21 mg/24hr patch Commonly known as:  NICODERM CQ - dosed in mg/24 hours Place 1 patch (21 mg total) onto the skin daily.   pantoprazole 40 MG tablet Commonly known as:  PROTONIX Take 1 tablet (40 mg total) by mouth daily.   rizatriptan 10 MG disintegrating tablet Commonly known as:  MAXALT-MLT Take 1 tablet (10 mg total) by mouth as needed for migraine. May repeat in 2 hours  if needed   simvastatin 20 MG tablet Commonly known as:  ZOCOR TAKE 1 TABLET BY MOUTH DAILY   topiramate 50 MG tablet Commonly known as:  TOPAMAX Take 1 tablet (50 mg total) by mouth 2 (two) times daily as needed (headache).   verapamil 240 MG CR tablet Commonly known as:  CALAN-SR Take 1 tablet (240 mg total) by mouth daily.        DISCHARGE INSTRUCTIONS:   If you experience worsening of your admission symptoms, develop shortness of breath, life threatening emergency, suicidal or homicidal thoughts you must seek medical attention  immediately by calling 911 or calling your MD immediately  if symptoms less severe.  You Must read complete instructions/literature along with all the possible adverse reactions/side effects for all the Medicines you take and that have been prescribed to you. Take any new Medicines after you have completely understood and accept all the possible adverse reactions/side effects.   Please note  You were cared for by a hospitalist during your hospital stay. If you have any questions about your discharge medications or the care you received while you were in the hospital after you are discharged, you can call the unit and asked to speak with the hospitalist on call if the hospitalist that took care of you is not available. Once you are discharged, your primary care physician will handle any further medical issues. Please note that NO REFILLS for any discharge medications will be authorized once you are discharged, as it is imperative that you return to your primary care physician (or establish a relationship with a primary care physician if you do not have one) for your aftercare needs so that they can reassess your need for medications and monitor your lab values.    Today   CHIEF COMPLAINT:   Chief Complaint  Patient presents with  . Seizures    HISTORY OF PRESENT ILLNESS:   63 y.o. female with a known history of anxiety, arthritis, COPD, depression, hyperlipidemia, hypertension, seizures, migraine-follows at [facility] neurology and takes Depakote at home.  She and her husband who I spoke to on the phone-both denies any recent change in the Depakote dosing or she missing any of her pills in last few days. In the morning around 4, she was walking in the house and had seizures and she fell down she walked her husband up.  Husband called EMS but by the time they came she was fully alert and oriented and she refused to go to emergency room.  Again after 2 to 3 hours she had one more episode of  seizures.  She fell down with that her again on the floor and was shaking whole body.  She was confused for a few minutes after that.  Husband called EMS and sent her to emergency room. Patient denies any other complaints other than that.  She does not have any chest pain shortness of breath or palpitation. While I saw in the emergency room she was complaining of severe headache. Her troponin was noted to be elevated in the ER but due to no changes on EKG and having seizure episodes, heparin is not started by ER physician.   VITAL SIGNS:  Blood pressure (!) 91/54, pulse (!) 57, temperature 98.1 F (36.7 C), temperature source Oral, resp. rate 19, height 5\' 7"  (1.702 m), weight 68 kg, SpO2 98 %.  I/O:    Intake/Output Summary (Last 24 hours) at 06/13/2018 0945 Last data filed at 06/13/2018 0347 Gross per 24 hour  Intake 139.34 ml  Output 200 ml  Net -60.66 ml    PHYSICAL EXAMINATION:  GENERAL:  63 y.o.-year-old patient lying in the bed with no acute distress.  EYES: Pupils equal, round, reactive to light and accommodation. No scleral icterus. Extraocular muscles intact.  HEENT: Head atraumatic, normocephalic. Oropharynx and nasopharynx clear.  NECK:  Supple, no jugular venous distention. No thyroid enlargement, no tenderness.  LUNGS: Normal breath sounds bilaterally, no wheezing, rales,rhonchi or crepitation. No use of accessory muscles of respiration.  CARDIOVASCULAR: S1, S2 normal. No murmurs, rubs, or gallops.  ABDOMEN: Soft, non-tender, non-distended. Bowel sounds present. No organomegaly or mass.  EXTREMITIES: No pedal edema, cyanosis, or clubbing.  NEUROLOGIC: Cranial nerves II through XII are intact. Muscle strength 5/5 in all extremities. Sensation intact. Gait not checked.  PSYCHIATRIC: The patient is alert and oriented x 3.  SKIN: No obvious rash, lesion, or ulcer.   DATA REVIEW:   CBC Recent Labs  Lab 06/13/18 0258  WBC 8.5  HGB 13.1  HCT 38.6  PLT 148*     Chemistries  Recent Labs  Lab 06/12/18 0647 06/13/18 0258  NA 141 141  K 3.5 2.9*  CL 105 107  CO2 22 25  GLUCOSE 128* 102*  BUN 16 12  CREATININE 1.01* 0.81  CALCIUM 9.2 8.6*  MG  --  2.1  AST 26  --   ALT 13  --   ALKPHOS 48  --   BILITOT 0.3  --     Cardiac Enzymes Recent Labs  Lab 06/13/18 0258  TROPONINI 0.79*    Microbiology Results  Results for orders placed or performed during the hospital encounter of 06/12/18  SARS Coronavirus 2 (CEPHEID - Performed in [place] hospital lab), Hosp Order     Status: None   Collection Time: 06/12/18 11:50 AM  Result Value Ref Range Status   SARS Coronavirus 2 NEGATIVE NEGATIVE Final    Comment: (NOTE) If result is NEGATIVE SARS-CoV-2 target nucleic acids are NOT DETECTED. The SARS-CoV-2 RNA is generally detectable in upper and lower  respiratory specimens during the acute phase of infection. The lowest  concentration of SARS-CoV-2 viral copies this assay can detect is 250  copies / mL. A negative result does not preclude SARS-CoV-2 infection  and should not be used as the sole basis for treatment or other  patient management decisions.  A negative result may occur with  improper specimen collection / handling, submission of specimen other  than nasopharyngeal swab, presence of viral mutation(s) within the  areas targeted by this assay, and inadequate number of viral copies  (<250 copies / mL). A negative result must be combined with clinical  observations, patient history, and epidemiological information. If result is POSITIVE SARS-CoV-2 target nucleic acids are DETECTED. The SARS-CoV-2 RNA is generally detectable in upper and lower  respiratory specimens dur ing the acute phase of infection.  Positive  results are indicative of active infection with SARS-CoV-2.  Clinical  correlation with patient history and other diagnostic information is  necessary to determine patient infection status.  Positive results do   not rule out bacterial infection or co-infection with other viruses. If result is PRESUMPTIVE POSTIVE SARS-CoV-2 nucleic acids MAY BE PRESENT.   A presumptive positive result was obtained on the submitted specimen  and confirmed on repeat testing.  While 2019 novel coronavirus  (SARS-CoV-2) nucleic acids may be present in the submitted sample  additional confirmatory testing may be necessary for epidemiological  and / or  clinical management purposes  to differentiate between  SARS-CoV-2 and other Sarbecovirus currently known to infect humans.  If clinically indicated additional testing with an alternate test  methodology [phone]) is advised. The SARS-CoV-2 RNA is generally  detectable in upper and lower respiratory sp ecimens during the acute  phase of infection. The expected result is Negative. Fact Sheet for Patients:  StrictlyIdeas.no Fact Sheet for Healthcare Providers: BankingDealers.co.za This test is not yet approved or cleared by the [place] FDA and has been authorized for detection and/or diagnosis of SARS-CoV-2 by FDA under an Emergency Use Authorization (EUA).  This EUA will remain in effect (meaning this test can be used) for the duration of the COVID-19 declaration under Section 564(b)(1) of the Act, 21 U.S.C. section 360bbb-3(b)(1), unless the authorization is terminated or revoked sooner. Performed at [facility], [place], [place] [address]     RADIOLOGY:  Ct Head Wo Contrast  Result Date: 06/12/2018 CLINICAL DATA:  Per RN note in pt chart "Pt arrived via EMS from home after she had two seizures this morning. Hx of the same. Pt does take Depakote with no known missed doses. First seizure was several hours ago and pt refused transport by EMS .*comment was truncated*Cannot find appropriate structured reason for exam - see REASON FOR EXAM (FREE TEXT) seizure EXAM: CT HEAD WITHOUT CONTRAST  TECHNIQUE: Contiguous axial images were obtained from the base of the skull through the vertex without intravenous contrast. COMPARISON:  Brain MRI 11/29/2017 FINDINGS: Brain: No acute intracranial hemorrhage. No focal mass lesion. No CT evidence of acute infarction. No midline shift or mass effect. No hydrocephalus. Basilar cisterns are patent. Vascular: No hyperdense vessel or unexpected calcification. Skull: Normal. Negative for fracture or focal lesion. Sinuses/Orbits: Paranasal sinuses and mastoid air cells are clear. Orbits are clear. Other: None. IMPRESSION: No acute intracranial findings Electronically Signed   By: Suzy Bouchard M.D.   On: 06/12/2018 08:09   Dg Chest Port 1 View  Result Date: 06/12/2018 CLINICAL DATA:  Encounter for multiple seizures onset this morning. Patient denies SOB, CP, cough or fever. Hx HTN, GERD, COPD. Current smoker.seizure EXAM: PORTABLE CHEST 1 VIEW COMPARISON:  11/01/2017 FINDINGS: Normal mediastinum and cardiac silhouette. Chronic central bronchitic markings. Normal pulmonary vasculature. No effusion, infiltrate, or pneumothorax. IMPRESSION: No acute findings.  Chronic bronchitic markings. Electronically Signed   By: Suzy Bouchard M.D.   On: 06/12/2018 07:35    EKG:   Orders placed or performed during the hospital encounter of 06/12/18  . ED EKG  . ED EKG  . ED EKG  . ED EKG      Management plans discussed with the patient, family and they are in agreement.  CODE STATUS:     Code Status Orders  (From admission, onward)         Start     Ordered   06/12/18 1642  Full code  Continuous     06/12/18 1641        Code Status History    This patient has a current code status but no historical code status.      TOTAL TIME TAKING CARE OF THIS PATIENT: 35 minutes.    Avel Peace  M.D on 06/13/2018 at 9:45 AM  Between 7am to 6pm - Pager - [phone]  After 6pm go to [email or web address] - password [email or web address]   Office  [phone]  CC: Primary care physician; Volney American, PA-C   Note: This  dictation was prepared with Dragon dictation along with smaller phrase technology. Any transcriptional errors that result from this process are unintentional.

## 2018-06-13 NOTE — Consult Note (Addendum)
ANTICOAGULATION CONSULT NOTE - Initial Consult  Pharmacy Consult for Heparin Drip Indication: chest pain/ACS  Allergies  Allergen Reactions  . Paroxetine Hcl Other (See Comments)    Pt. Becomes very aggressive with mood swings.  . Codeine Nausea And Vomiting  . Fluoxetine Anxiety  . Paroxetine Anxiety    Patient Measurements: Height: 5\' 7"  (170.2 cm) Weight: 150 lb (68 kg) IBW/kg (Calculated) : 61.6 Heparin Dosing Weight: 68 kg  Vital Signs: Temp: 98.7 F (37.1 C) (05/10 0341) Temp Source: Oral (05/10 0341) BP: 99/63 (05/10 0341) Pulse Rate: 61 (05/10 0341)  Labs: Recent Labs    06/12/18 0647 06/12/18 1045 06/12/18 1631 06/12/18 2124 06/13/18 0258  HGB 13.9  --   --   --  13.1  HCT 42.4  --   --   --  38.6  PLT 155  --   --   --  148*  APTT  --   --   --  27  --   LABPROT  --   --   --  13.9  --   INR  --   --   --  1.1  --   HEPARINUNFRC  --   --   --  <0.10* 0.81*  CREATININE 1.01*  --   --   --  0.81  TROPONINI 0.05* 0.56* 1.90* 1.42*  --     Estimated Creatinine Clearance: 70.9 mL/min (by C-G formula based on SCr of 0.81 mg/dL).   Medical History: Past Medical History:  Diagnosis Date  . Allergy   . Anxiety    agoraphobia   . Arthritis   . Chicken pox   . COPD (chronic obstructive pulmonary disease) ([place])   . Depression   . GERD (gastroesophageal reflux disease)   . Headache   . History of blood transfusion    1984  . Hyperlipidemia   . Hypertension   . Migraine   . Seizure ([place])    was on keppra    Medications:  Medications Prior to Admission  Medication Sig Dispense Refill Last Dose  . Calcium-Magnesium-Vitamin D (CALCIUM 500 PO) Take 1 tablet by mouth daily.    06/11/2018 at 0800  . cholecalciferol (VITAMIN D) 1000 units tablet Take 1,000 Units by mouth daily.   06/11/2018 at 0800  . divalproex (DEPAKOTE) 500 MG DR tablet Take 1 tablet (500 mg total) by mouth 2 (two) times daily. 180 tablet 4 06/11/2018 at 2000  . gabapentin (NEURONTIN) 300  MG capsule Take 1 capsule (300 mg total) by mouth 2 (two) times daily. 60 capsule 11 06/11/2018 at 2000  . pantoprazole (PROTONIX) 40 MG tablet Take 1 tablet (40 mg total) by mouth daily. 90 tablet 3 06/11/2018 at 0800  . simvastatin (ZOCOR) 20 MG tablet TAKE 1 TABLET BY MOUTH DAILY 90 tablet 1 06/11/2018 at 1800  . dimenhyDRINATE (DRAMAMINE) 50 MG tablet Take 50 mg by mouth daily.   prn at prn  . hydrOXYzine (ATARAX/VISTARIL) 25 MG tablet Take 1 tablet (25 mg total) by mouth 3 (three) times daily as needed. (Patient not taking: Reported on 05/19/2018) 90 tablet 1 Not Taking  . rizatriptan (MAXALT-MLT) 10 MG disintegrating tablet Take 1 tablet (10 mg total) by mouth as needed for migraine. May repeat in 2 hours if needed 9 tablet 11 prn at prn  . verapamil (CALAN-SR) 240 MG CR tablet Take 1 tablet (240 mg total) by mouth daily. 90 tablet 3 Taking   Scheduled:  . cholecalciferol  1,000 Units Oral  Daily  . dimenhyDRINATE  50 mg Oral Daily  . divalproex  750 mg Oral BID  . gabapentin  300 mg Oral BID  . multivitamin with minerals  1 tablet Oral Daily  . nicotine  21 mg Transdermal Daily  . pantoprazole  40 mg Oral Daily  . simvastatin  20 mg Oral Daily  . verapamil  240 mg Oral Daily   Infusions:  . sodium chloride Stopped (06/12/18 2145)  . heparin 850 Units/hr (06/13/18 0302)   PRN: sodium chloride, docusate sodium, LORazepam, ondansetron (ZOFRAN) IV  Assessment: Patient's troponin level has increased from 0.05 on admittance to 1.90. Patient has no anticoagulant PTA and has not received heparin while admitted. Pharmacy has been consulted to initiate Heparin drip on patient for NSTEMI.   Goal of Therapy:  Heparin level 0.3-0.7 units/ml Monitor platelets by anticoagulation protocol: Yes   Plan:  05/10 @ 0400 HL 0.81 supratherapeutic. Will decrease rate to 700 units/hr and will recheck HL @ 1000, CBC stable will continue to monitor.  Tobie Lords, PharmD, BCPS Clinical  Pharmacist 06/13/2018

## 2018-06-13 NOTE — Consult Note (Signed)
[facility] Consultation Note  Patient ID: Kayla Shah, MRN: [id number], DOB/AGE: 08-03-1955 63 y.o. Admit date: 06/12/2018   Date of Consult: 06/13/2018 Primary Physician: Volney American, PA-C Primary Cardiologist: None  Chief Complaint:  Chief Complaint  Patient presents with  . Seizures   Reason for Consult: Elevated troponin  HPI: 63 y.o. female with known significant family history of cardiovascular disease and borderline hypertension who has a significant tobacco abuse and hyperlipidemia.  The patient has not had any treatment for these cardiovascular risk factors and apparently has not had any significant issues with heart failure congestive symptoms angina or significant shortness of breath in the recent months.  She has not been very active to know whether these are concerns or issues.  She has had a seizure disorder for which she has had worsening episodes of seizure disorder in recent weeks.  She had 2 seizures of which she does not remember at all but they were typical in nature.  There was no chest pain or other symptoms after that event.  She did have incidental findings of a chest x-ray being normal troponin of 1.9 and an EKG on admission of normal sinus rhythm with inferior ST depression consistent with myocardial ischemia.  These are concerns for silent ischemia and non-ST elevation myocardial infarction.  We have discussed at length further diagnostic testing and treatment options  Past Medical History:  Diagnosis Date  . Allergy   . Anxiety    agoraphobia   . Arthritis   . Chicken pox   . COPD (chronic obstructive pulmonary disease) ([place])   . Depression   . GERD (gastroesophageal reflux disease)   . Headache   . History of blood transfusion    1984  . Hyperlipidemia   . Hypertension   . Migraine   . Seizure [facility])    was on keppra      Surgical History:  Past Surgical History:  Procedure Laterality Date  . ABDOMINAL HYSTERECTOMY  1984    endometriosis  . APPENDECTOMY    . COLONOSCOPY WITH PROPOFOL N/A 03/26/2017   Procedure: COLONOSCOPY WITH PROPOFOL;  Surgeon: Jonathon Bellows, MD;  Location: [facility] ENDOSCOPY;  Service: Gastroenterology;  Laterality: N/A;  . WISDOM TOOTH EXTRACTION       Home Meds: Prior to Admission medications   Medication Sig Start Date End Date Taking? Authorizing Provider  Calcium-Magnesium-Vitamin D (CALCIUM 500 PO) Take 1 tablet by mouth daily.    Yes [provider]  cholecalciferol (VITAMIN D) 1000 units tablet Take 1,000 Units by mouth daily.   Yes [provider]  divalproex (DEPAKOTE) 500 MG DR tablet Take 1 tablet (500 mg total) by mouth 2 (two) times daily. 05/31/18  Yes Penumalli, Earlean Polka, MD  gabapentin (NEURONTIN) 300 MG capsule Take 1 capsule (300 mg total) by mouth 2 (two) times daily. 10/27/17  Yes McLean-Scocuzza, Nino Glow, MD  pantoprazole (PROTONIX) 40 MG tablet Take 1 tablet (40 mg total) by mouth daily. 06/07/15  Yes Krebs, [name] Lauren, NP  simvastatin (ZOCOR) 20 MG tablet TAKE 1 TABLET BY MOUTH DAILY 05/14/18  Yes Volney American, PA-C  dimenhyDRINATE (DRAMAMINE) 50 MG tablet Take 50 mg by mouth daily.    [provider]  divalproex (DEPAKOTE) 250 MG DR tablet Take 3 tablets (750 mg total) by mouth 2 (two) times daily. 06/13/18   Salary, Avel Peace, MD  hydrOXYzine (ATARAX/VISTARIL) 25 MG tablet Take 1 tablet (25 mg total) by mouth 3 (three) times daily  as needed. Patient not taking: Reported on 05/19/2018 03/10/18   Volney American, PA-C  nicotine (NICODERM CQ - DOSED IN MG/24 HOURS) 21 mg/24hr patch Place 1 patch (21 mg total) onto the skin daily. 06/13/18   Salary, Avel Peace, MD  rizatriptan (MAXALT-MLT) 10 MG disintegrating tablet Take 1 tablet (10 mg total) by mouth as needed for migraine. May repeat in 2 hours if needed 05/31/18   Penumalli, Earlean Polka, MD  topiramate (TOPAMAX) 50 MG tablet Take 1 tablet (50 mg total) by mouth 2 (two) times daily as needed  (headache). 06/13/18   Salary, Avel Peace, MD  verapamil (CALAN-SR) 240 MG CR tablet Take 1 tablet (240 mg total) by mouth daily. 10/08/17 05/19/18  McLean-Scocuzza, Nino Glow, MD    Inpatient Medications:  . cholecalciferol  1,000 Units Oral Daily  . dimenhyDRINATE  50 mg Oral Daily  . divalproex  750 mg Oral BID  . gabapentin  300 mg Oral BID  . multivitamin with minerals  1 tablet Oral Daily  . nicotine  21 mg Transdermal Daily  . pantoprazole  40 mg Oral Daily  . simvastatin  20 mg Oral Daily  . verapamil  240 mg Oral Daily   . sodium chloride Stopped (06/12/18 2145)    Allergies:  Allergies  Allergen Reactions  . Paroxetine Hcl Other (See Comments)    Pt. Becomes very aggressive with mood swings.  . Codeine Nausea And Vomiting  . Fluoxetine Anxiety  . Paroxetine Anxiety    Social History   Socioeconomic History  . Marital status: Divorced    Spouse name: Legrand Como  . Number of children: 0  . Years of education: 62  . Highest education level: Not on file  Occupational History    Comment: disabled  Social Needs  . Financial resource strain: Not on file  . Food insecurity:    Worry: Not on file    Inability: Not on file  . Transportation needs:    Medical: Not on file    Non-medical: Not on file  Tobacco Use  . Smoking status: Current Every Day Smoker    Packs/day: 1.00    Years: 45.00    Pack years: 45.00    Types: Cigarettes  . Smokeless tobacco: Never Used  Substance and Sexual Activity  . Alcohol use: No  . Drug use: Yes    Types: Marijuana  . Sexual activity: Never  Lifestyle  . Physical activity:    Days per week: Not on file    Minutes per session: Not on file  . Stress: Not on file  Relationships  . Social connections:    Talks on phone: Not on file    Gets together: Not on file    Attends religious service: Not on file    Active member of club or organization: Not on file    Attends meetings of clubs or organizations: Not on file    Relationship  status: Not on file  . Intimate partner violence:    Fear of current or ex partner: Not on file    Emotionally abused: Not on file    Physically abused: Not on file    Forced sexual activity: Not on file  Other Topics Concern  . Not on file  Social History Narrative   Disabled    Married no kids    No guns, wears seat belt, feels safe in relationship    12th grade ed.      Family History  Problem  Relation Age of Onset  . Stroke Mother   . Depression Mother   . Arthritis Mother   . Heart disease Father   . Alcohol abuse Father   . Diabetes Father   . Osteoporosis Sister   . Kidney disease Sister   . Arthritis Sister   . Arthritis Maternal Grandmother   . Heart disease Maternal Grandmother   . Hyperlipidemia Maternal Grandmother   . Diabetes Paternal Grandmother   . Breast cancer Neg Hx      Review of Systems Positive for seizure disorder Negative for: General:  chills, fever, night sweats or weight changes.  Cardiovascular: PND orthopnea syncope dizziness  Dermatological skin lesions rashes Respiratory: Cough congestion Urologic: Frequent urination urination at night and hematuria Abdominal: negative for nausea, vomiting, diarrhea, bright red blood per rectum, melena, or hematemesis Neurologic: negative for visual changes, and/or hearing changes  All other systems reviewed and are otherwise negative except as noted above.  Labs: Recent Labs    06/12/18 1045 06/12/18 1631 06/12/18 2124 06/13/18 0258  TROPONINI 0.56* 1.90* 1.42* 0.79*   Lab Results  Component Value Date   WBC 8.5 06/13/2018   HGB 13.1 06/13/2018   HCT 38.6 06/13/2018   MCV 99.7 06/13/2018   PLT 148 (L) 06/13/2018    Recent Labs  Lab 06/12/18 0647 06/13/18 0258  NA 141 141  K 3.5 2.9*  CL 105 107  CO2 22 25  BUN 16 12  CREATININE 1.01* 0.81  CALCIUM 9.2 8.6*  PROT 8.0  --   BILITOT 0.3  --   ALKPHOS 48  --   ALT 13  --   AST 26  --   GLUCOSE 128* 102*   Lab Results   Component Value Date   CHOL 152 06/12/2018   HDL 73 06/12/2018   LDLCALC 70 06/12/2018   TRIG 47 06/12/2018   No results found for: DDIMER  Radiology/Studies:  Ct Head Wo Contrast  Result Date: 06/12/2018 CLINICAL DATA:  Per RN note in pt chart "Pt arrived via EMS from home after she had two seizures this morning. Hx of the same. Pt does take Depakote with no known missed doses. First seizure was several hours ago and pt refused transport by EMS .*comment was truncated*Cannot find appropriate structured reason for exam - see REASON FOR EXAM (FREE TEXT) seizure EXAM: CT HEAD WITHOUT CONTRAST TECHNIQUE: Contiguous axial images were obtained from the base of the skull through the vertex without intravenous contrast. COMPARISON:  Brain MRI 11/29/2017 FINDINGS: Brain: No acute intracranial hemorrhage. No focal mass lesion. No CT evidence of acute infarction. No midline shift or mass effect. No hydrocephalus. Basilar cisterns are patent. Vascular: No hyperdense vessel or unexpected calcification. Skull: Normal. Negative for fracture or focal lesion. Sinuses/Orbits: Paranasal sinuses and mastoid air cells are clear. Orbits are clear. Other: None. IMPRESSION: No acute intracranial findings Electronically Signed   By: Suzy Bouchard M.D.   On: 06/12/2018 08:09   Dg Chest Port 1 View  Result Date: 06/12/2018 CLINICAL DATA:  Encounter for multiple seizures onset this morning. Patient denies SOB, CP, cough or fever. Hx HTN, GERD, COPD. Current smoker.seizure EXAM: PORTABLE CHEST 1 VIEW COMPARISON:  11/01/2017 FINDINGS: Normal mediastinum and cardiac silhouette. Chronic central bronchitic markings. Normal pulmonary vasculature. No effusion, infiltrate, or pneumothorax. IMPRESSION: No acute findings.  Chronic bronchitic markings. Electronically Signed   By: Suzy Bouchard M.D.   On: 06/12/2018 07:35    EKG: Normal sinus rhythm with ST depression in inferior  leads  Weights: Filed Weights   06/12/18 0643   Weight: 68 kg     Physical Exam: Blood pressure (!) 91/54, pulse (!) 57, temperature 98.1 F (36.7 C), temperature source Oral, resp. rate 19, height 5\' 7"  (1.702 m), weight 68 kg, SpO2 98 %. Body mass index is 23.49 kg/m. General: Well developed, well nourished, in no acute distress. Head eyes ears nose throat: Normocephalic, atraumatic, sclera non-icteric, no xanthomas, nares are without discharge. No apparent thyromegaly and/or mass  Lungs: Normal respiratory effort.  no wheezes, no rales, no rhonchi.  Heart: RRR with normal S1 S2. no murmur gallop, no rub, PMI is normal size and placement, carotid upstroke normal without bruit, jugular venous pressure is normal Abdomen: Soft, non-tender, non-distended with normoactive bowel sounds. No hepatomegaly. No rebound/guarding. No obvious abdominal masses. Abdominal aorta is normal size without bruit Extremities: No edema. no cyanosis, no clubbing, no ulcers  Peripheral : 2+ bilateral upper extremity pulses, 2+ bilateral femoral pulses, 2+ bilateral dorsal pedal pulse Neuro: Alert and oriented. No facial asymmetry. No focal deficit. Moves all extremities spontaneously. Musculoskeletal: Normal muscle tone without kyphosis Psych:  Responds to questions appropriately with a normal affect.    Assessment: 63 year old female with borderline hypertension hyperlipidemia tobacco use with family history of cardiovascular disease having seizure disorder and non-ST elevation myocardial infarction  Plan: 1.  Continue heparin and aspirin for further risk reduction cardiovascular event 2.  No additional medication management for above due to concerns of hypotension and bradycardia 3.  Proceed to cardiac catheterization to assess coronary anatomy and further treatment thereof as necessary.  Patient understands risk and benefits of cardiac catheterization.  This includes a possibility death stroke heart attack infection bleeding blood clot.  She is at low risk  for conscious sedation  Signed, Corey Skains M.D. [facility] 06/13/2018, 11:26 AM

## 2018-06-13 NOTE — Progress Notes (Signed)
Subjective: No further seizures noted.  Patient reports that other than headache she feels back to baseline  Objective: Current vital signs: BP (!) 91/54 (BP Location: Left Arm)   Pulse (!) 57   Temp 98.1 F (36.7 C) (Oral)   Resp 19   Ht 5\' 7"  (1.702 m)   Wt 68 kg   SpO2 98%   BMI 23.49 kg/m  Vital signs in last 24 hours: Temp:  [98.1 F (36.7 C)-98.7 F (37.1 C)] 98.1 F (36.7 C) (05/10 0759) Pulse Rate:  [57-93] 57 (05/10 0759) Resp:  [14-20] 19 (05/10 0759) BP: (91-128)/(54-94) 91/54 (05/10 0759) SpO2:  [93 %-100 %] 98 % (05/10 0759)  Intake/Output from previous day: 05/09 0701 - 05/10 0700 In: 139.3 [I.V.:84.4; IV Piggyback:55] Out: 200 [Urine:200] Intake/Output this shift: Total I/O In: -  Out: 300 [Urine:300] Nutritional status:  Diet Order            Diet - low sodium heart healthy        Diet Heart Room service appropriate? Yes; Fluid consistency: Thin  Diet effective now              Neurologic Exam: Mental Status: Alert, oriented, thought content appropriate.  Speech fluent without evidence of aphasia.  Able to follow 3 step commands without difficulty. Cranial Nerves: II: Discs flat bilaterally; Visual fields grossly normal, pupils equal, round, reactive to light and accommodation III,IV, VI: ptosis not present, extra-ocular motions intact bilaterally V,VII: smile symmetric, facial light touch sensation normal bilaterally VIII: hearing normal bilaterally IX,X: gag reflex present XI: bilateral shoulder shrug XII: midline tongue extension Motor: 5/5 thorughout Sensory: Pinprick and light touch intact throughout, bilaterally  Lab Results: Basic Metabolic Panel: Recent Labs  Lab 06/12/18 0647 06/13/18 0258  NA 141 141  K 3.5 2.9*  CL 105 107  CO2 22 25  GLUCOSE 128* 102*  BUN 16 12  CREATININE 1.01* 0.81  CALCIUM 9.2 8.6*  MG  --  2.1    Liver Function Tests: Recent Labs  Lab 06/12/18 0647  AST 26  ALT 13  ALKPHOS 48  BILITOT  0.3  PROT 8.0  ALBUMIN 4.0   No results for input(s): LIPASE, AMYLASE in the last 168 hours. No results for input(s): AMMONIA in the last 168 hours.  CBC: Recent Labs  Lab 06/12/18 0647 06/13/18 0258  WBC 8.8 8.5  NEUTROABS 7.0  --   HGB 13.9 13.1  HCT 42.4 38.6  MCV 102.7* 99.7  PLT 155 148*    Cardiac Enzymes: Recent Labs  Lab 06/12/18 0647 06/12/18 1045 06/12/18 1631 06/12/18 2124 06/13/18 0258  TROPONINI 0.05* 0.56* 1.90* 1.42* 0.79*    Lipid Panel: Recent Labs  Lab 06/12/18 1631  CHOL 152  TRIG 47  HDL 73  CHOLHDL 2.1  VLDL 9  LDLCALC 70    CBG: Recent Labs  Lab 06/12/18 0645  GLUCAP 133*    Microbiology: Results for orders placed or performed during the hospital encounter of 06/12/18  SARS Coronavirus 2 (CEPHEID - Performed in [place] hospital lab), Hosp Order     Status: None   Collection Time: 06/12/18 11:50 AM  Result Value Ref Range Status   SARS Coronavirus 2 NEGATIVE NEGATIVE Final    Comment: (NOTE) If result is NEGATIVE SARS-CoV-2 target nucleic acids are NOT DETECTED. The SARS-CoV-2 RNA is generally detectable in upper and lower  respiratory specimens during the acute phase of infection. The lowest  concentration of SARS-CoV-2 viral copies this assay  can detect is 250  copies / mL. A negative result does not preclude SARS-CoV-2 infection  and should not be used as the sole basis for treatment or other  patient management decisions.  A negative result may occur with  improper specimen collection / handling, submission of specimen other  than nasopharyngeal swab, presence of viral mutation(s) within the  areas targeted by this assay, and inadequate number of viral copies  (<250 copies / mL). A negative result must be combined with clinical  observations, patient history, and epidemiological information. If result is POSITIVE SARS-CoV-2 target nucleic acids are DETECTED. The SARS-CoV-2 RNA is generally detectable in upper and  lower  respiratory specimens dur ing the acute phase of infection.  Positive  results are indicative of active infection with SARS-CoV-2.  Clinical  correlation with patient history and other diagnostic information is  necessary to determine patient infection status.  Positive results do  not rule out bacterial infection or co-infection with other viruses. If result is PRESUMPTIVE POSTIVE SARS-CoV-2 nucleic acids MAY BE PRESENT.   A presumptive positive result was obtained on the submitted specimen  and confirmed on repeat testing.  While 2017/05/15 novel coronavirus  (SARS-CoV-2) nucleic acids may be present in the submitted sample  additional confirmatory testing may be necessary for epidemiological  and / or clinical management purposes  to differentiate between  SARS-CoV-2 and other Sarbecovirus currently known to infect humans.  If clinically indicated additional testing with an alternate test  methodology [id number]) is advised. The SARS-CoV-2 RNA is generally  detectable in upper and lower respiratory sp ecimens during the acute  phase of infection. The expected result is Negative. Fact Sheet for Patients:  StrictlyIdeas.no Fact Sheet for Healthcare Providers: BankingDealers.co.za This test is not yet approved or cleared by the [place] FDA and has been authorized for detection and/or diagnosis of SARS-CoV-2 by FDA under an Emergency Use Authorization (EUA).  This EUA will remain in effect (meaning this test can be used) for the duration of the COVID-19 declaration under Section 564(b)(1) of the Act, 21 U.S.C. section 360bbb-3(b)(1), unless the authorization is terminated or revoked sooner. Performed at [facility], [address] [place], [place] [address]     Coagulation Studies: Recent Labs    06/12/18 05-16-22  LABPROT 13.9  INR 1.1    Imaging: Ct Head Wo Contrast  Result Date: 06/12/2018 CLINICAL DATA:  Per  RN note in pt chart "Pt arrived via EMS from home after she had two seizures this morning. Hx of the same. Pt does take Depakote with no known missed doses. First seizure was several hours ago and pt refused transport by EMS .*comment was truncated*Cannot find appropriate structured reason for exam - see REASON FOR EXAM (FREE TEXT) seizure EXAM: CT HEAD WITHOUT CONTRAST TECHNIQUE: Contiguous axial images were obtained from the base of the skull through the vertex without intravenous contrast. COMPARISON:  Brain MRI 11/29/2017 FINDINGS: Brain: No acute intracranial hemorrhage. No focal mass lesion. No CT evidence of acute infarction. No midline shift or mass effect. No hydrocephalus. Basilar cisterns are patent. Vascular: No hyperdense vessel or unexpected calcification. Skull: Normal. Negative for fracture or focal lesion. Sinuses/Orbits: Paranasal sinuses and mastoid air cells are clear. Orbits are clear. Other: None. IMPRESSION: No acute intracranial findings Electronically Signed   By: Suzy Bouchard M.D.   On: 06/12/2018 08:09   Dg Chest Port 1 View  Result Date: 06/12/2018 CLINICAL DATA:  Encounter for multiple seizures onset this morning. Patient  denies SOB, CP, cough or fever. Hx HTN, GERD, COPD. Current smoker.seizure EXAM: PORTABLE CHEST 1 VIEW COMPARISON:  11/01/2017 FINDINGS: Normal mediastinum and cardiac silhouette. Chronic central bronchitic markings. Normal pulmonary vasculature. No effusion, infiltrate, or pneumothorax. IMPRESSION: No acute findings.  Chronic bronchitic markings. Electronically Signed   By: Suzy Bouchard M.D.   On: 06/12/2018 07:35    Medications:  I have reviewed the patient's current medications. Scheduled: . cholecalciferol  1,000 Units Oral Daily  . dimenhyDRINATE  50 mg Oral Daily  . divalproex  750 mg Oral BID  . gabapentin  300 mg Oral BID  . multivitamin with minerals  1 tablet Oral Daily  . nicotine  21 mg Transdermal Daily  . pantoprazole  40 mg Oral  Daily  . simvastatin  20 mg Oral Daily  . sodium chloride flush  3 mL Intravenous Q12H  . verapamil  240 mg Oral Daily    Assessment/Plan: 63 year old female with breakthrough seizures.  Depakote level initially subtherapeutic.  Overnight got IV and po dosing at about 2100.  0300 Depakote level supratherapeutic at 105.  Likely due to dosing.    Recommendations: 1. Skip AM dosing and start new maintenance dosing tonight at 750mg  BID 2. Continue Neurontin dosing at pre-admission dose 3. Continue follow up with Dr. Leta Baptist 4. Patient unable to drive, operate heavy machinery, perform activities at heights and participate in water activities until release by outpatient physician.   LOS: 1 day   Alexis Goodell, MD Neurology [phone] 06/13/2018  12:30 PM

## 2018-06-14 ENCOUNTER — Telehealth: Payer: Self-pay | Admitting: Diagnostic Neuroimaging

## 2018-06-14 LAB — ECHOCARDIOGRAM COMPLETE
Height: 67 in
Weight: 2400 oz

## 2018-06-14 LAB — HIV ANTIBODY (ROUTINE TESTING W REFLEX): HIV Screen 4th Generation wRfx: NONREACTIVE

## 2018-06-14 NOTE — Telephone Encounter (Signed)
Reviewed hospital notes; agree with increased dose of depakote. pls setup follow up in 3 months with Debbora Presto, NP. -VRP

## 2018-06-14 NOTE — Telephone Encounter (Signed)
Called patient and informed her Dr Leta Baptist reviewed all hospital notes and he agrees with increased Depakote dose. Advised she needs a 3 month FU with NP. We scheduled FU and I advised she call for any questions, problems, seizure activity prior to FU. She verbalized understanding, appreciation.

## 2018-06-14 NOTE — Telephone Encounter (Signed)
Pt has called to inform that according to her husband she had 2 seizures starting around 4:00am Saturday morning.EMT came to home. Pt was told she came right out of them asking what was going on. Once EMT's left pt was told she went into her bedroom and her husband heard her fall off the bed so 911 was called again.  Pt was released from hospital on yesterday.  Pt is asking for a call and she states the RN can speak with her husband for more details.

## 2018-06-16 ENCOUNTER — Ambulatory Visit: Admission: RE | Admit: 2018-06-16 | Payer: Medicare Other | Source: Home / Self Care | Admitting: Internal Medicine

## 2018-06-16 ENCOUNTER — Encounter: Admission: RE | Payer: Self-pay | Source: Home / Self Care

## 2018-06-16 SURGERY — LEFT HEART CATH AND CORONARY ANGIOGRAPHY
Anesthesia: Moderate Sedation

## 2018-06-21 ENCOUNTER — Telehealth: Payer: Self-pay | Admitting: Diagnostic Neuroimaging

## 2018-06-21 ENCOUNTER — Telehealth: Payer: Self-pay | Admitting: Family Medicine

## 2018-06-21 MED ORDER — ONDANSETRON 4 MG PO TBDP: 4.0000 mg | ORAL_TABLET | Freq: Three times a day (TID) | ORAL | 0 refills | Status: DC | PRN

## 2018-06-21 NOTE — Telephone Encounter (Signed)
LVM advising the patient that Dr Leta Baptist stated she needs to give depakote and topamax another week to see full effectiveness. Advised she take them as directed and reviewed directions. Left number for any questions and advised she call back for any sudden or drastic changes, concerns.

## 2018-06-21 NOTE — Telephone Encounter (Signed)
Patient notified

## 2018-06-21 NOTE — Telephone Encounter (Signed)
Zofran sent. 

## 2018-06-21 NOTE — Telephone Encounter (Signed)
Patient called in and states topiramate (TOPAMAX) 50 MG tablet is not working can she please trying something else she is having headaches daily.

## 2018-06-21 NOTE — Telephone Encounter (Signed)
I'm assuming this is a second thread to the same topic - patient was called this morning and told to call her Neurologist about her headaches and medication and I sent her zofran for her nausea  Copied from [place] [phone]. Topic: General - Inquiry >> Jun 14, 2018 10:34 AM Scherrie Gerlach wrote: Reason for CRM: pt states she got out of the hospital yesterday and was to let Jeanise Durfey know, according to her AVS >> Jun 14, 2018  4:16 PM Stark Klein wrote: LVM for pt to call back >> Jun 21, 2018  7:59 AM Leward Quan A wrote: Patient called to say that Topamax is not doing anything for her headaches and she would like to know if there is something that can be done please. Ph#  [phone]

## 2018-06-21 NOTE — Telephone Encounter (Signed)
Patient would like a prescription for zofran to [facility], and she will contact neurology.

## 2018-06-21 NOTE — Telephone Encounter (Signed)
Give more time for TPX and depakote to take effect (1 more week). -VRP

## 2018-06-21 NOTE — Telephone Encounter (Signed)
Happy to send over some zofran if she would like, but she needs to be letting her neurologist know about persistent migraines particularly given her recent seizures/admission to hospital and medication changes  Copied from [place] [phone]. Topic: General - Other >> Jun 21, 2018  7:36 AM Carolyn Stare wrote: Pt said she is still having nausea and migraines and would like a call back,

## 2018-06-22 ENCOUNTER — Telehealth: Payer: Self-pay | Admitting: Diagnostic Neuroimaging

## 2018-06-22 MED ORDER — DIVALPROEX SODIUM 250 MG PO DR TAB: 750.0000 mg | DELAYED_RELEASE_TABLET | Freq: Two times a day (BID) | ORAL | 4 refills | Status: DC

## 2018-06-22 NOTE — Telephone Encounter (Signed)
Pt states that when she filled her prescription yesterday for her divalproex (DEPAKOTE) 250 MG DR tablet the pharmacy asked her why she was going through her medication so fast. She stated that when she went to the hospital the Neurologist that saw her increased her to instead of 2 a day she is to take 3 a day. Pt states the pharmacy was not informed of this. She also states that she has always had 500mg  and not the 250mg  that is in her chart. Please advise.

## 2018-06-22 NOTE — Telephone Encounter (Signed)
Called patient and advised Dr Jerelyn Charles, [facility] prescribed depakote 750 mg twice daily, printed RX. She stated she doesn't think she has Rx, but she has been taking Depakote 500 mg, 1 tab  3 x daily since being discharged. She stated she feels fine, no seizure activity.  I advised will get clarification from Dr Leta Baptist as to which dosing schedule she needs to use. Will call her back and also call pharmacist  and let him know.  She  verbalized understanding, appreciation.

## 2018-06-22 NOTE — Telephone Encounter (Addendum)
Called patient and advised her Dr Leta Baptist wants her to take depakote 250 mg, take 3 tabs two times daily.  I advised her I will call pharmacy to clarify. She had called for refill on 5000 mg tabs; I advised thye can re shelve it and give her new Rx. She stated she took 500 mg tab this morning. I advised she take 500 mg this afternoon and 500 mg tonight for total 1500 mg/24 hours. Pick up new Rx and begin 750 mg twice a day tomorrow. She repeated correctly, and verbalized understanding, appreciation. I called [facility], spoke with [place], pharmacist. She will reverse depakote 500 mg refill and process new Rx. She verbalized understanding, appreciation.

## 2018-06-22 NOTE — Telephone Encounter (Signed)
Meds ordered this encounter  Medications  . divalproex (DEPAKOTE) 250 MG DR tablet    Sig: Take 3 tablets (750 mg total) by mouth 2 (two) times daily.    Dispense:  540 tablet    Refill:  [place], MD 4/53/6468, 03:21 AM Certified in Neurology, Neurophysiology and [facility] [address] [place], [place] [address] [phone]

## 2018-06-23 ENCOUNTER — Telehealth: Payer: Self-pay | Admitting: Family Medicine

## 2018-06-23 NOTE — Telephone Encounter (Signed)
Called pt to go over Covid-19 screening for tomorrow's appt, no answer, left voicemail.

## 2018-06-24 ENCOUNTER — Encounter: Payer: Self-pay | Admitting: Family Medicine

## 2018-06-24 ENCOUNTER — Ambulatory Visit (INDEPENDENT_AMBULATORY_CARE_PROVIDER_SITE_OTHER): Payer: Medicare Other | Admitting: Family Medicine

## 2018-06-24 ENCOUNTER — Other Ambulatory Visit: Payer: Self-pay

## 2018-06-24 VITALS — BP 138/79 | HR 87 | Temp 99.3°F

## 2018-06-24 DIAGNOSIS — F329 Major depressive disorder, single episode, unspecified: Secondary | ICD-10-CM

## 2018-06-24 DIAGNOSIS — R569 Unspecified convulsions: Secondary | ICD-10-CM | POA: Diagnosis not present

## 2018-06-24 DIAGNOSIS — F419 Anxiety disorder, unspecified: Secondary | ICD-10-CM

## 2018-06-24 DIAGNOSIS — F4000 Agoraphobia, unspecified: Secondary | ICD-10-CM

## 2018-06-24 DIAGNOSIS — G43009 Migraine without aura, not intractable, without status migrainosus: Secondary | ICD-10-CM

## 2018-06-24 DIAGNOSIS — F32A Depression, unspecified: Secondary | ICD-10-CM

## 2018-06-24 MED ORDER — ONDANSETRON 4 MG PO TBDP: 4.0000 mg | ORAL_TABLET | Freq: Three times a day (TID) | ORAL | 0 refills | Status: DC | PRN

## 2018-06-24 NOTE — Progress Notes (Signed)
BP 138/79   Pulse 87   Temp 99.3 F (37.4 C) (Oral)   SpO2 100%    Subjective:    Patient ID: Kayla Shah, female    DOB: 13-Jan-1956, 63 y.o.   MRN: [id number]  HPI: Kayla Shah is a 63 y.o. female  Chief Complaint  Patient presents with  . Hospitalization Follow-up   Patient presents today to discuss a recent hospitalization for seizures as well as significantly poorly controlled anxiety.   Working with her outpatient Neurologist on medication changes since being discharged from the hospital for seizures. Has not had seizure activity since d/c but having persistent migraines accompanied by nausea. Zofran prn helping with this.   Previously on benzodiazepines for her chronic anxiety and agoraphobia, states this is the only thing that helps her and she knows what her body needs. Refuses Psychiatry or trying new/different medications. Having frequent panic attacks, shaking constantly from her nerves being "shot". States she's tried counseling and many medicines in the past with no relief. Denies SI/HI.   Depression screen [facility] 2/9 03/10/2018 11/12/2017 10/08/2017  Decreased Interest 1 1 0  Down, Depressed, Hopeless 1 0 0  PHQ - 2 Score 2 1 0  Altered sleeping 1 0 -  Tired, decreased energy 0 0 -  Change in appetite 0 0 -  Feeling bad or failure about yourself  0 0 -  Trouble concentrating 0 0 -  Moving slowly or fidgety/restless 0 0 -  Suicidal thoughts 0 0 -  PHQ-9 Score 3 1 -  Difficult doing work/chores - Not difficult at all -   GAD 7 : Generalized Anxiety Score 03/10/2018 11/12/2017  Nervous, Anxious, on Edge 3 1  Control/stop worrying 3 1  Worry too much - different things 3 1  Trouble relaxing 3 1  Restless 3 0  Easily annoyed or irritable 1 1  Afraid - awful might happen 1 0  Total GAD 7 Score 17 5  Anxiety Difficulty - Not difficult at all   Relevant past medical, surgical, family and social history reviewed and updated as indicated. Interim medical  history since our last visit reviewed. Allergies and medications reviewed and updated.  Review of Systems  Per HPI unless specifically indicated above     Objective:    BP 138/79   Pulse 87   Temp 99.3 F (37.4 C) (Oral)   SpO2 100%   Wt Readings from Last 3 Encounters:  06/12/18 150 lb (68 kg)  03/10/18 111 lb (50.3 kg)  11/18/17 112 lb 9.6 oz (51.1 kg)    Physical Exam Vitals signs and nursing note reviewed.  Constitutional:      Appearance: Normal appearance. She is not ill-appearing.  HENT:     Head: Atraumatic.  Eyes:     Extraocular Movements: Extraocular movements intact.     Conjunctiva/sclera: Conjunctivae normal.  Neck:     Musculoskeletal: Normal range of motion and neck supple.  Cardiovascular:     Rate and Rhythm: Normal rate and regular rhythm.     Heart sounds: Normal heart sounds.  Pulmonary:     Effort: Pulmonary effort is normal.     Breath sounds: Normal breath sounds.  Musculoskeletal: Normal range of motion.  Skin:    General: Skin is warm and dry.  Neurological:     Mental Status: She is alert and oriented to person, place, and time.  Psychiatric:        Thought Content: Thought content normal.  Comments: Appears anxious, jittery     Results for orders placed or performed during the hospital encounter of 06/12/18  SARS Coronavirus 2 (CEPHEID - Performed in [place] hospital lab), [facility] Order  Result Value Ref Range   SARS Coronavirus 2 NEGATIVE NEGATIVE  CBC with Differential  Result Value Ref Range   WBC 8.8 4.0 - 10.5 K/uL   RBC 4.13 3.87 - 5.11 MIL/uL   Hemoglobin 13.9 12.0 - 15.0 g/dL   HCT 42.4 36.0 - 46.0 %   MCV 102.7 (H) 80.0 - 100.0 fL   MCH 33.7 26.0 - 34.0 pg   MCHC 32.8 30.0 - 36.0 g/dL   RDW 12.8 11.5 - 15.5 %   Platelets 155 150 - 400 K/uL   nRBC 0.0 0.0 - 0.2 %   Neutrophils Relative % 80 %   Neutro Abs 7.0 1.7 - 7.7 K/uL   Lymphocytes Relative 15 %   Lymphs Abs 1.3 0.7 - 4.0 K/uL   Monocytes Relative 4 %    Monocytes Absolute 0.4 0.1 - 1.0 K/uL   Eosinophils Relative 1 %   Eosinophils Absolute 0.1 0.0 - 0.5 K/uL   Basophils Relative 0 %   Basophils Absolute 0.0 0.0 - 0.1 K/uL   Immature Granulocytes 0 %   Abs Immature Granulocytes 0.03 0.00 - 0.07 K/uL  Comprehensive metabolic panel  Result Value Ref Range   Sodium 141 135 - 145 mmol/L   Potassium 3.5 3.5 - 5.1 mmol/L   Chloride 105 98 - 111 mmol/L   CO2 22 22 - 32 mmol/L   Glucose, Bld 128 (H) 70 - 99 mg/dL   BUN 16 8 - 23 mg/dL   Creatinine, Ser 1.01 (H) 0.44 - 1.00 mg/dL   Calcium 9.2 8.9 - 10.3 mg/dL   Total Protein 8.0 6.5 - 8.1 g/dL   Albumin 4.0 3.5 - 5.0 g/dL   AST 26 15 - 41 U/L   ALT 13 0 - 44 U/L   Alkaline Phosphatase 48 38 - 126 U/L   Total Bilirubin 0.3 0.3 - 1.2 mg/dL   GFR calc non Af Amer >60 >60 mL/min   GFR calc Af Amer >60 >60 mL/min   Anion gap 14 5 - 15  Troponin I - Once  Result Value Ref Range   Troponin I 0.05 (HH) <0.03 ng/mL  Urinalysis, Complete w Microscopic  Result Value Ref Range   Color, Urine YELLOW (A) YELLOW   APPearance CLOUDY (A) CLEAR   Specific Gravity, Urine 1.014 1.005 - 1.030   pH 7.0 5.0 - 8.0   Glucose, UA NEGATIVE NEGATIVE mg/dL   Hgb urine dipstick MODERATE (A) NEGATIVE   Bilirubin Urine NEGATIVE NEGATIVE   Ketones, ur 5 (A) NEGATIVE mg/dL   Protein, ur NEGATIVE NEGATIVE mg/dL   Nitrite NEGATIVE NEGATIVE   Leukocytes,Ua NEGATIVE NEGATIVE   RBC / HPF 6-10 0 - 5 RBC/hpf   WBC, UA 0-5 0 - 5 WBC/hpf   Bacteria, UA RARE (A) NONE SEEN   Squamous Epithelial / LPF NONE SEEN 0 - 5   Amorphous Crystal PRESENT   Urine Drug Screen, Qualitative  Result Value Ref Range   Tricyclic, Ur Screen NONE DETECTED NONE DETECTED   Amphetamines, Ur Screen NONE DETECTED NONE DETECTED   MDMA (Ecstasy)Ur Screen NONE DETECTED NONE DETECTED   Cocaine Metabolite,Ur [place] NONE DETECTED NONE DETECTED   Opiate, Ur Screen NONE DETECTED NONE DETECTED   Phencyclidine (PCP) Ur S NONE DETECTED NONE DETECTED    Cannabinoid  50 Ng, Ur [place] POSITIVE (A) NONE DETECTED   Barbiturates, Ur Screen POSITIVE (A) NONE DETECTED   Benzodiazepine, Ur Scrn POSITIVE (A) NONE DETECTED   Methadone Scn, Ur NONE DETECTED NONE DETECTED  Glucose, capillary  Result Value Ref Range   Glucose-Capillary 133 (H) 70 - 99 mg/dL  Ethanol  Result Value Ref Range   Alcohol, Ethyl (B) <10 <10 mg/dL  Valproic acid level  Result Value Ref Range   Valproic Acid Lvl 33 (L) 50.0 - 100.0 ug/mL  Troponin I - Once  Result Value Ref Range   Troponin I 0.56 (HH) <0.03 ng/mL  Troponin I - Now Then Q6H  Result Value Ref Range   Troponin I 1.90 (HH) <0.03 ng/mL  Troponin I - Now Then Q6H  Result Value Ref Range   Troponin I 1.42 (HH) <0.03 ng/mL  Troponin I - Now Then Q6H  Result Value Ref Range   Troponin I 0.79 (HH) <0.03 ng/mL  Lipid panel  Result Value Ref Range   Cholesterol 152 0 - 200 mg/dL   Triglycerides 47 <150 mg/dL   HDL 73 >40 mg/dL   Total CHOL/HDL Ratio 2.1 RATIO   VLDL 9 0 - 40 mg/dL   LDL Cholesterol 70 0 - 99 mg/dL  Hemoglobin A1c  Result Value Ref Range   Hgb A1c MFr Bld 5.6 4.8 - 5.6 %   Mean Plasma Glucose 114.02 mg/dL  Valproic acid level  Result Value Ref Range   Valproic Acid Lvl 22 (L) 50.0 - 100.0 ug/mL  HIV antibody (Routine Testing)  Result Value Ref Range   HIV Screen 4th Generation wRfx Non Reactive Non Reactive  Basic metabolic panel  Result Value Ref Range   Sodium 141 135 - 145 mmol/L   Potassium 2.9 (L) 3.5 - 5.1 mmol/L   Chloride 107 98 - 111 mmol/L   CO2 25 22 - 32 mmol/L   Glucose, Bld 102 (H) 70 - 99 mg/dL   BUN 12 8 - 23 mg/dL   Creatinine, Ser 0.81 0.44 - 1.00 mg/dL   Calcium 8.6 (L) 8.9 - 10.3 mg/dL   GFR calc non Af Amer >60 >60 mL/min   GFR calc Af Amer >60 >60 mL/min   Anion gap 9 5 - 15  CBC  Result Value Ref Range   WBC 8.5 4.0 - 10.5 K/uL   RBC 3.87 3.87 - 5.11 MIL/uL   Hemoglobin 13.1 12.0 - 15.0 g/dL   HCT 38.6 36.0 - 46.0 %   MCV 99.7 80.0 - 100.0 fL   MCH  33.9 26.0 - 34.0 pg   MCHC 33.9 30.0 - 36.0 g/dL   RDW 12.7 11.5 - 15.5 %   Platelets 148 (L) 150 - 400 K/uL   nRBC 0.0 0.0 - 0.2 %  Valproic acid level  Result Value Ref Range   Valproic Acid Lvl 105 (H) 50.0 - 100.0 ug/mL  Heparin level (unfractionated)  Result Value Ref Range   Heparin Unfractionated <0.10 (L) 0.30 - 0.70 IU/mL  APTT  Result Value Ref Range   aPTT 27 24 - 36 seconds  Protime-INR  Result Value Ref Range   Prothrombin Time 13.9 11.4 - 15.2 seconds   INR 1.1 0.8 - 1.2  Heparin level (unfractionated)  Result Value Ref Range   Heparin Unfractionated 0.81 (H) 0.30 - 0.70 IU/mL  Heparin level (unfractionated)  Result Value Ref Range   Heparin Unfractionated 0.28 (L) 0.30 - 0.70 IU/mL  Magnesium  Result Value Ref Range  Magnesium 2.1 1.7 - 2.4 mg/dL  ECHOCARDIOGRAM COMPLETE  Result Value Ref Range   Weight 2,400 oz   Height 67 in   BP 91/54 mmHg      Assessment & Plan:   Problem List Items Addressed This Visit      Cardiovascular and Mediastinum   Migraine - Primary    Following with Neurology, will call them about her persistent migraines. Continue zofran prn for nausea and current migraine medications per their recommendation        Other   Seizures ([place])    Working closely with Neurology on medication adjustments since d/c from hospital. Continue following with them and current regimen per their recommendations      Anxiety and depression    Chronic, significantly poorly controlled. Long discussion today with strong recommendation to have a consultation with Psychiatry either at a local walk in clinlic (several options given to her) or having a referral placed. Patient adamantly declines these options or counseling. Does not want to discuss any medication changes, just wanting some xanax sent over for prn relief. Discussed at length that I am not comfortable initiating this therapy on it's own and that I would like for her to receive a Psychiatry  consultation. She will think about this and let me know if she changes her mind      Agoraphobia    Declines Psychiatric referral or counseling at this time. Will call if she changes her mind. Declines new medications          Follow up plan: Return in about 6 months (around 12/25/2018) for CPE.

## 2018-06-29 NOTE — Assessment & Plan Note (Signed)
Declines Psychiatric referral or counseling at this time. Will call if she changes her mind. Declines new medications

## 2018-06-29 NOTE — Assessment & Plan Note (Signed)
Chronic, significantly poorly controlled. Long discussion today with strong recommendation to have a consultation with Psychiatry either at a local walk in clinlic (several options given to her) or having a referral placed. Patient adamantly declines these options or counseling. Does not want to discuss any medication changes, just wanting some xanax sent over for prn relief. Discussed at length that I am not comfortable initiating this therapy on it's own and that I would like for her to receive a Psychiatry consultation. She will think about this and let me know if she changes her mind

## 2018-06-29 NOTE — Assessment & Plan Note (Signed)
Working closely with Neurology on medication adjustments since d/c from hospital. Continue following with them and current regimen per their recommendations

## 2018-06-29 NOTE — Assessment & Plan Note (Signed)
Following with Neurology, will call them about her persistent migraines. Continue zofran prn for nausea and current migraine medications per their recommendation

## 2018-07-13 ENCOUNTER — Telehealth: Payer: Self-pay | Admitting: Diagnostic Neuroimaging

## 2018-07-13 MED ORDER — FREMANEZUMAB-VFRM 225 MG/1.5ML ~~LOC~~ SOSY: 225.0000 mg | PREFILLED_SYRINGE | SUBCUTANEOUS | 4 refills | Status: DC

## 2018-07-13 MED ORDER — SUMATRIPTAN SUCCINATE 100 MG PO TABS: 100.0000 mg | ORAL_TABLET | Freq: Once | ORAL | 6 refills | Status: DC | PRN

## 2018-07-13 NOTE — Telephone Encounter (Signed)
Change rizatriptan to sumatriptan for migraine rescue.  Add ajovy for migraine prevention.   Meds ordered this encounter  Medications  . SUMAtriptan (IMITREX) 100 MG tablet    Sig: Take 1 tablet (100 mg total) by mouth once as needed for migraine. May repeat x 1 after 2 hours; maximum 2 tabs per day and 8 tabs per month    Dispense:  8 tablet    Refill:  6  . Fremanezumab-vfrm (AJOVY) 225 MG/1.5ML SOSY    Sig: Inject 225 mg into the skin every 30 (thirty) days.    Dispense:  3 Syringe    Refill:  [place], MD 02/08/1094, 0:45 PM Certified in Neurology, Neurophysiology and Neuroimaging  [facility] [address] [place], [place] [address] [phone]

## 2018-07-13 NOTE — Telephone Encounter (Addendum)
Patient returned call and stated rizatriptan has not really helped from the beginning. She was willing to try it several times. For the last three days she's had a constant right-sided headache with nausea, and ibuprofen 800 mg has not helped. She is taking zofran as needed with relief of nausea. She's not sure if another triptan will be any more helpful. She would like Dr Gladstone Lighter recommendations for a medication for breakthrough pain relief. I advised will send him this message and call her back. She verbalized understanding, appreciation.

## 2018-07-13 NOTE — Telephone Encounter (Signed)
Pt called in stating the rizatriptan (MAXALT-MLT) 10 MG disintegrating tablet isnt working and she wants to know if something else can be taken to help her Migraines

## 2018-07-13 NOTE — Addendum Note (Signed)
Addended by: Andrey Spearman R on: 07/13/2018 04:22 PM   Modules accepted: Orders

## 2018-07-13 NOTE — Telephone Encounter (Signed)
Called patient and informed her of two new Rx. I advised her pharmacy may have to order Ajovy, and it will require a PA. I advised when they try to fill Rx, insurance will reject and require PA. That will generate a PA request that I will complete.   I advised she call them tomorrow if she doesn't hear form them. I advised her it is a monthly subqu injection. I advised Dr Leta Baptist discontinued rizatriptan and ordered Imitrex for breakthrough. I advised she call for any questions, concerns. She  verbalized understanding, appreciation.

## 2018-07-13 NOTE — Telephone Encounter (Addendum)
Called patient and LVM requesting call back to discuss.

## 2018-07-14 ENCOUNTER — Telehealth: Payer: Self-pay | Admitting: Diagnostic Neuroimaging

## 2018-07-14 NOTE — Telephone Encounter (Signed)
Refer to note dated 07/13/18.

## 2018-07-14 NOTE — Telephone Encounter (Signed)
Received denial of Ajovy from [employer], is not on her formulry. Patient must first try Aimovig and Emgality. Otherwise provider must give medical reasons why the 2 covered drugs are not appropriate for patient. Will advise Dr Leta Baptist.

## 2018-07-14 NOTE — Telephone Encounter (Signed)
Pt called stating that she appreciates all that has been done for her but she is not able to afford the injection price. Please advise.

## 2018-07-14 NOTE — Telephone Encounter (Addendum)
From phone staff: Pt called stating that she appreciates all that has been done for her but she is not able to afford the injection price. Please advise. 07/14/18 Patient was made aware yesterday the this med would require PA and perhaps she would need to try Aimovig or Emgality, depending on her insurance. Received PA on CMM, will complete PA., Key: [id number], questions answered, sent to [facility].

## 2018-07-15 MED ORDER — AIMOVIG 70 MG/ML ~~LOC~~ SOAJ: 70.0000 mg | SUBCUTANEOUS | 11 refills | Status: DC

## 2018-07-15 NOTE — Telephone Encounter (Addendum)
Per Dr Gladstone Lighter verbal order, discontinued Ajovy and ordered Aimovig 70 mg, sent to [place], [place]. Called patient and LVM advising her that her insurance denied Ajovy, not on their formulary. She must try two other injectable meds first. I informed her Dr Leta Baptist ordered Aimovig, however her insurance will require a PA again. I advised she call pharmacy to get PA started. The PA will be done, and she may have co pay. I advised we hope it is more affordable. I requested she call back if she has any questions.

## 2018-07-15 NOTE — Addendum Note (Signed)
Addended by: Florian Buff C on: 07/15/2018 12:00 PM   Modules accepted: Orders

## 2018-07-16 ENCOUNTER — Other Ambulatory Visit: Payer: Self-pay | Admitting: Family Medicine

## 2018-07-16 DIAGNOSIS — K219 Gastro-esophageal reflux disease without esophagitis: Secondary | ICD-10-CM

## 2018-07-16 NOTE — Telephone Encounter (Signed)
Please advise if Apolonio Schneiders would like to take over this rx

## 2018-07-29 ENCOUNTER — Encounter: Payer: Self-pay | Admitting: Family Medicine

## 2018-07-29 ENCOUNTER — Ambulatory Visit (INDEPENDENT_AMBULATORY_CARE_PROVIDER_SITE_OTHER): Payer: Medicare Other | Admitting: Family Medicine

## 2018-07-29 ENCOUNTER — Other Ambulatory Visit: Payer: Self-pay

## 2018-07-29 VITALS — BP 127/78 | HR 65 | Temp 98.6°F | Ht 65.0 in | Wt 117.0 lb

## 2018-07-29 DIAGNOSIS — K5900 Constipation, unspecified: Secondary | ICD-10-CM

## 2018-07-29 MED ORDER — LINACLOTIDE 145 MCG PO CAPS: 145.0000 ug | ORAL_CAPSULE | Freq: Every day | ORAL | 0 refills | Status: DC

## 2018-07-29 NOTE — Progress Notes (Signed)
BP 127/78 (BP Location: Left Arm, Patient Position: Sitting, Cuff Size: Normal)   Pulse 65   Temp 98.6 F (37 C) (Oral)   Ht 5\' 5"  (1.651 m)   Wt 117 lb (53.1 kg) Comment: Patient never has been 150#. Wrong previous entry.  SpO2 97%   BMI 19.47 kg/m    Subjective:    Patient ID: Kayla Shah, female    DOB: 08/21/1955, 63 y.o.   MRN: [id number]  HPI: Kayla Shah is a 63 y.o. female  Chief Complaint  Patient presents with  . Constipation    Ongoing a couple months. Patient has not had a bowel movent at all in 2 week. Use OTC medications and Enemas.   . Abdominal Pain   Over 2 weeks of constipation without any relief from multiple enemas, up to 8 dulcolax daily, and miralax powder. Drinking and eating as usual, no major dietary changes lately. Denies ever having issues like this in the past. No weight loss, bloody stools, known bowel issues, recent medication changes. UTD on colonoscopy, had it last year in 2019.   Relevant past medical, surgical, family and social history reviewed and updated as indicated. Interim medical history since our last visit reviewed. Allergies and medications reviewed and updated.  Review of Systems  Per HPI unless specifically indicated above     Objective:    BP 127/78 (BP Location: Left Arm, Patient Position: Sitting, Cuff Size: Normal)   Pulse 65   Temp 98.6 F (37 C) (Oral)   Ht 5\' 5"  (1.651 m)   Wt 117 lb (53.1 kg) Comment: Patient never has been 150#. Wrong previous entry.  SpO2 97%   BMI 19.47 kg/m   Wt Readings from Last 3 Encounters:  07/29/18 117 lb (53.1 kg)  06/12/18 150 lb (68 kg)  03/10/18 111 lb (50.3 kg)    Physical Exam Vitals signs and nursing note reviewed.  Constitutional:      Appearance: Normal appearance. She is not ill-appearing.  HENT:     Head: Atraumatic.  Eyes:     Extraocular Movements: Extraocular movements intact.     Conjunctiva/sclera: Conjunctivae normal.  Neck:     Musculoskeletal:  Normal range of motion and neck supple.  Cardiovascular:     Rate and Rhythm: Normal rate and regular rhythm.     Heart sounds: Normal heart sounds.  Pulmonary:     Effort: Pulmonary effort is normal.     Breath sounds: Normal breath sounds.  Abdominal:     General: Bowel sounds are normal.     Tenderness: There is abdominal tenderness (mild lower abdominal ttp). There is no right CVA tenderness, left CVA tenderness, guarding or rebound.     Comments: Lower abdomen fullness to palpation b/l  Genitourinary:    Comments: Rectal exam declined Musculoskeletal: Normal range of motion.  Skin:    General: Skin is warm and dry.  Neurological:     Mental Status: She is alert and oriented to person, place, and time.  Psychiatric:        Mood and Affect: Mood normal.        Thought Content: Thought content normal.        Judgment: Judgment normal.     Results for orders placed or performed during the hospital encounter of 06/12/18  SARS Coronavirus 2 (CEPHEID - Performed in [place] hospital lab), [facility] Order   Specimen: Nasopharyngeal Swab  Result Value Ref Range   SARS Coronavirus 2 NEGATIVE NEGATIVE  CBC with Differential  Result Value Ref Range   WBC 8.8 4.0 - 10.5 K/uL   RBC 4.13 3.87 - 5.11 MIL/uL   Hemoglobin 13.9 12.0 - 15.0 g/dL   HCT 42.4 36.0 - 46.0 %   MCV 102.7 (H) 80.0 - 100.0 fL   MCH 33.7 26.0 - 34.0 pg   MCHC 32.8 30.0 - 36.0 g/dL   RDW 12.8 11.5 - 15.5 %   Platelets 155 150 - 400 K/uL   nRBC 0.0 0.0 - 0.2 %   Neutrophils Relative % 80 %   Neutro Abs 7.0 1.7 - 7.7 K/uL   Lymphocytes Relative 15 %   Lymphs Abs 1.3 0.7 - 4.0 K/uL   Monocytes Relative 4 %   Monocytes Absolute 0.4 0.1 - 1.0 K/uL   Eosinophils Relative 1 %   Eosinophils Absolute 0.1 0.0 - 0.5 K/uL   Basophils Relative 0 %   Basophils Absolute 0.0 0.0 - 0.1 K/uL   Immature Granulocytes 0 %   Abs Immature Granulocytes 0.03 0.00 - 0.07 K/uL  Comprehensive metabolic panel  Result Value Ref Range    Sodium 141 135 - 145 mmol/L   Potassium 3.5 3.5 - 5.1 mmol/L   Chloride 105 98 - 111 mmol/L   CO2 22 22 - 32 mmol/L   Glucose, Bld 128 (H) 70 - 99 mg/dL   BUN 16 8 - 23 mg/dL   Creatinine, Ser 1.01 (H) 0.44 - 1.00 mg/dL   Calcium 9.2 8.9 - 10.3 mg/dL   Total Protein 8.0 6.5 - 8.1 g/dL   Albumin 4.0 3.5 - 5.0 g/dL   AST 26 15 - 41 U/L   ALT 13 0 - 44 U/L   Alkaline Phosphatase 48 38 - 126 U/L   Total Bilirubin 0.3 0.3 - 1.2 mg/dL   GFR calc non Af Amer >60 >60 mL/min   GFR calc Af Amer >60 >60 mL/min   Anion gap 14 5 - 15  Troponin I - Once  Result Value Ref Range   Troponin I 0.05 (HH) <0.03 ng/mL  Urinalysis, Complete w Microscopic  Result Value Ref Range   Color, Urine YELLOW (A) YELLOW   APPearance CLOUDY (A) CLEAR   Specific Gravity, Urine 1.014 1.005 - 1.030   pH 7.0 5.0 - 8.0   Glucose, UA NEGATIVE NEGATIVE mg/dL   Hgb urine dipstick MODERATE (A) NEGATIVE   Bilirubin Urine NEGATIVE NEGATIVE   Ketones, ur 5 (A) NEGATIVE mg/dL   Protein, ur NEGATIVE NEGATIVE mg/dL   Nitrite NEGATIVE NEGATIVE   Leukocytes,Ua NEGATIVE NEGATIVE   RBC / HPF 6-10 0 - 5 RBC/hpf   WBC, UA 0-5 0 - 5 WBC/hpf   Bacteria, UA RARE (A) NONE SEEN   Squamous Epithelial / LPF NONE SEEN 0 - 5   Amorphous Crystal PRESENT   Urine Drug Screen, Qualitative  Result Value Ref Range   Tricyclic, Ur Screen NONE DETECTED NONE DETECTED   Amphetamines, Ur Screen NONE DETECTED NONE DETECTED   MDMA (Ecstasy)Ur Screen NONE DETECTED NONE DETECTED   Cocaine Metabolite,Ur [place] NONE DETECTED NONE DETECTED   Opiate, Ur Screen NONE DETECTED NONE DETECTED   Phencyclidine (PCP) Ur S NONE DETECTED NONE DETECTED   Cannabinoid 50 Ng, Ur [place] POSITIVE (A) NONE DETECTED   Barbiturates, Ur Screen POSITIVE (A) NONE DETECTED   Benzodiazepine, Ur Scrn POSITIVE (A) NONE DETECTED   Methadone Scn, Ur NONE DETECTED NONE DETECTED  Glucose, capillary  Result Value Ref Range   Glucose-Capillary 133 (H)  70 - 99 mg/dL  Ethanol   Result Value Ref Range   Alcohol, Ethyl (B) <10 <10 mg/dL  Valproic acid level  Result Value Ref Range   Valproic Acid Lvl 33 (L) 50.0 - 100.0 ug/mL  Troponin I - Once  Result Value Ref Range   Troponin I 0.56 (HH) <0.03 ng/mL  Troponin I - Now Then Q6H  Result Value Ref Range   Troponin I 1.90 (HH) <0.03 ng/mL  Troponin I - Now Then Q6H  Result Value Ref Range   Troponin I 1.42 (HH) <0.03 ng/mL  Troponin I - Now Then Q6H  Result Value Ref Range   Troponin I 0.79 (HH) <0.03 ng/mL  Lipid panel  Result Value Ref Range   Cholesterol 152 0 - 200 mg/dL   Triglycerides 47 <150 mg/dL   HDL 73 >40 mg/dL   Total CHOL/HDL Ratio 2.1 RATIO   VLDL 9 0 - 40 mg/dL   LDL Cholesterol 70 0 - 99 mg/dL  Hemoglobin A1c  Result Value Ref Range   Hgb A1c MFr Bld 5.6 4.8 - 5.6 %   Mean Plasma Glucose 114.02 mg/dL  Valproic acid level  Result Value Ref Range   Valproic Acid Lvl 22 (L) 50.0 - 100.0 ug/mL  HIV antibody (Routine Testing)  Result Value Ref Range   HIV Screen 4th Generation wRfx Non Reactive Non Reactive  Basic metabolic panel  Result Value Ref Range   Sodium 141 135 - 145 mmol/L   Potassium 2.9 (L) 3.5 - 5.1 mmol/L   Chloride 107 98 - 111 mmol/L   CO2 25 22 - 32 mmol/L   Glucose, Bld 102 (H) 70 - 99 mg/dL   BUN 12 8 - 23 mg/dL   Creatinine, Ser 0.81 0.44 - 1.00 mg/dL   Calcium 8.6 (L) 8.9 - 10.3 mg/dL   GFR calc non Af Amer >60 >60 mL/min   GFR calc Af Amer >60 >60 mL/min   Anion gap 9 5 - 15  CBC  Result Value Ref Range   WBC 8.5 4.0 - 10.5 K/uL   RBC 3.87 3.87 - 5.11 MIL/uL   Hemoglobin 13.1 12.0 - 15.0 g/dL   HCT 38.6 36.0 - 46.0 %   MCV 99.7 80.0 - 100.0 fL   MCH 33.9 26.0 - 34.0 pg   MCHC 33.9 30.0 - 36.0 g/dL   RDW 12.7 11.5 - 15.5 %   Platelets 148 (L) 150 - 400 K/uL   nRBC 0.0 0.0 - 0.2 %  Valproic acid level  Result Value Ref Range   Valproic Acid Lvl 105 (H) 50.0 - 100.0 ug/mL  Heparin level (unfractionated)  Result Value Ref Range   Heparin  Unfractionated <0.10 (L) 0.30 - 0.70 IU/mL  APTT  Result Value Ref Range   aPTT 27 24 - 36 seconds  Protime-INR  Result Value Ref Range   Prothrombin Time 13.9 11.4 - 15.2 seconds   INR 1.1 0.8 - 1.2  Heparin level (unfractionated)  Result Value Ref Range   Heparin Unfractionated 0.81 (H) 0.30 - 0.70 IU/mL  Heparin level (unfractionated)  Result Value Ref Range   Heparin Unfractionated 0.28 (L) 0.30 - 0.70 IU/mL  Magnesium  Result Value Ref Range   Magnesium 2.1 1.7 - 2.4 mg/dL  ECHOCARDIOGRAM COMPLETE  Result Value Ref Range   Weight 2,400 oz   Height 67 in   BP 91/54 mmHg      Assessment & Plan:   Problem List Items Addressed This Visit  None    Visit Diagnoses    Constipation, unspecified constipation type    -  Primary   Relevant Orders   Ambulatory referral to Gastroenterology     New, unclear etiology. Given significant duration and resistance to multiple OTC remedies tried, will refer to GI for further evaluation. Start on linzess in meantime to see if getting a benefit. Continue miralax, enemas prn. Discussed possibility of rectal impaction given duration of issue, knows to go to ER for disimpaction and possibly imaging if no relief in next few days  Follow up plan: Return for as scheduled.

## 2018-07-30 ENCOUNTER — Telehealth: Payer: Self-pay | Admitting: Family Medicine

## 2018-07-30 NOTE — Telephone Encounter (Signed)
Noted, thanks  Copied from [place] [id number]. Topic: General - Other >> Jul 30, 2018 10:29 AM Percell Belt A wrote: Reason for CRM: pt called in and was told to let Apolonio Schneiders if she had any type of bowel movement.  She state she did have a little this morning.  Nothing like it should be but progress.  This was just [facility] number  [phone]

## 2018-08-04 NOTE — Telephone Encounter (Signed)
Pt states she hasn't heard from the GI doctor and has been taking 3 duloclax and that seems to help with her bowel movements.  Pt wants to know if she still needs to see GI since she has found a resolution to the problem on her own.

## 2018-09-06 ENCOUNTER — Encounter: Payer: Self-pay | Admitting: Family Medicine

## 2018-09-06 ENCOUNTER — Other Ambulatory Visit: Payer: Self-pay

## 2018-09-06 ENCOUNTER — Ambulatory Visit (INDEPENDENT_AMBULATORY_CARE_PROVIDER_SITE_OTHER): Payer: Medicare Other | Admitting: Family Medicine

## 2018-09-06 VITALS — BP 116/73 | HR 55 | Temp 98.0°F | Ht 65.0 in | Wt 115.6 lb

## 2018-09-06 DIAGNOSIS — R569 Unspecified convulsions: Secondary | ICD-10-CM

## 2018-09-06 NOTE — Progress Notes (Signed)
PATIENT: Kayla Shah DOB: February 20, 1955  REASON FOR VISIT: follow up HISTORY FROM: patient  Chief Complaint  Patient presents with  . Follow-up    3 mon f/u. Husband present. Rm 2. Patient was recently in the hospital for seizures and she would like to discuss her Depakote.     HISTORY OF PRESENT ILLNESS: Today 09/06/18 Kayla Shah is a 63 y.o. female here today for follow up for seizure. She was admitted to [facility] on 06/12/2018 following two seizures at home. Depakote was subtherapeutic on admission. She denies missed doses. Dose was increased to 750mg  twice daily. She has noticed an increase in tremor since increasing dose but is tolerating well otherwise. She denies seizure activity. She is feeling well today and without complaints.   Upon review of ER notes, she had elevated troponin and abnormal EKG (inferior ST depression). Cardiology consulted and advised additional testing. She has extensive family history of cardiovascular disease. She is treated for HTN and told that lipids are elevated (not currently treated). She is smoking with no desire to quit.   HISTORY: (copied from Dr Gladstone Lighter note on 11/18/2017)  63 year old female here for evaluation of seizure and migraine.  Patient first developed seizures in 60s.  She was on Keppra at that time.  At some point she was taken off of Keppra.  2014 she was hospitalized for seizure.  She may have been on and off of Keppra following 2014 but she is not sure.  Patient reports grand mal seizures and postictal confusion symptoms but is not quite sure about the frequency.  She had another seizure possibly in September 2019.  She is not on antiseizure medication at this time.  Patient has history of migraine headaches at age 66 years old with pounding throbbing headaches, nausea, photophobia, typically unilateral right greater than left-sided headaches.  She averages 4-5 headaches per month.  She is not sure what  type of medication she has been on the past.  Patient also has history of depression, anxiety, stress, weight loss, chronic pain.   REVIEW OF SYSTEMS: Out of a complete 14 system review of symptoms, the patient complains only of the following symptoms, memory loss, dizziness, seizure, speech difficulty, tremor and all other reviewed systems are negative.  ALLERGIES: Allergies  Allergen Reactions  . Paroxetine Hcl Other (See Comments)    Pt. Becomes very aggressive with mood swings.  . Codeine Nausea And Vomiting  . Fluoxetine Anxiety  . Paroxetine Anxiety    HOME MEDICATIONS: Outpatient Medications Prior to Visit  Medication Sig Dispense Refill  . Calcium-Magnesium-Vitamin D (CALCIUM 500 PO) Take 1 tablet by mouth daily.     . cholecalciferol (VITAMIN D) 1000 units tablet Take 1,000 Units by mouth daily.    . divalproex (DEPAKOTE) 250 MG DR tablet Take 3 tablets (750 mg total) by mouth 2 (two) times daily. 540 tablet 4  . gabapentin (NEURONTIN) 300 MG capsule Take 1 capsule (300 mg total) by mouth 2 (two) times daily. 60 capsule 11  . ibuprofen (ADVIL) 200 MG tablet Take 200 mg by mouth every 6 (six) hours as needed.    . linaclotide (LINZESS) 145 MCG CAPS capsule Take 1 capsule (145 mcg total) by mouth daily before breakfast. 30 capsule 0  . ondansetron (ZOFRAN ODT) 4 MG disintegrating tablet Take 1 tablet (4 mg total) by mouth every 8 (eight) hours as needed. 40 tablet 0  . pantoprazole (PROTONIX) 40 MG tablet TAKE 1 TABLET BY MOUTH TWICE  A DAY 60 tablet 2  . simvastatin (ZOCOR) 20 MG tablet TAKE 1 TABLET BY MOUTH DAILY 90 tablet 1  . SUMAtriptan (IMITREX) 100 MG tablet Take 1 tablet (100 mg total) by mouth once as needed for migraine. May repeat x 1 after 2 hours; maximum 2 tabs per day and 8 tabs per month 8 tablet 6  . topiramate (TOPAMAX) 50 MG tablet Take 1 tablet (50 mg total) by mouth 2 (two) times daily as needed (headache). 30 tablet 0  . verapamil (CALAN-SR) 240 MG CR  tablet Take 1 tablet (240 mg total) by mouth daily. 90 tablet 3   No facility-administered medications prior to visit.     PAST MEDICAL HISTORY: Past Medical History:  Diagnosis Date  . Allergy   . Anxiety    agoraphobia   . Arthritis   . Chicken pox   . COPD (chronic obstructive pulmonary disease) ([place])   . Depression   . GERD (gastroesophageal reflux disease)   . Headache   . History of blood transfusion    1984  . Hyperlipidemia   . Hypertension   . Migraine   . Seizure ([place])    was on keppra    PAST SURGICAL HISTORY: Past Surgical History:  Procedure Laterality Date  . ABDOMINAL HYSTERECTOMY  1984   endometriosis  . APPENDECTOMY    . COLONOSCOPY WITH PROPOFOL N/A 03/26/2017   Procedure: COLONOSCOPY WITH PROPOFOL;  Surgeon: Jonathon Bellows, MD;  Location: [facility] ENDOSCOPY;  Service: Gastroenterology;  Laterality: N/A;  . WISDOM TOOTH EXTRACTION      FAMILY HISTORY: Family History  Problem Relation Age of Onset  . Stroke Mother   . Depression Mother   . Arthritis Mother   . Heart disease Father   . Alcohol abuse Father   . Diabetes Father   . Osteoporosis Sister   . Kidney disease Sister   . Arthritis Sister   . Arthritis Maternal Grandmother   . Heart disease Maternal Grandmother   . Hyperlipidemia Maternal Grandmother   . Diabetes Paternal Grandmother   . Breast cancer Neg Hx     SOCIAL HISTORY: Social History   Socioeconomic History  . Marital status: Divorced    Spouse name: Legrand Como  . Number of children: 0  . Years of education: 44  . Highest education level: Not on file  Occupational History    Comment: disabled  Social Needs  . Financial resource strain: Not on file  . Food insecurity    Worry: Not on file    Inability: Not on file  . Transportation needs    Medical: Not on file    Non-medical: Not on file  Tobacco Use  . Smoking status: Current Every Day Smoker    Packs/day: 0.25    Years: 45.00    Pack years: 11.25    Types:  Cigarettes  . Smokeless tobacco: Never Used  Substance and Sexual Activity  . Alcohol use: No  . Drug use: Yes    Types: Marijuana  . Sexual activity: Never  Lifestyle  . Physical activity    Days per week: Not on file    Minutes per session: Not on file  . Stress: Not on file  Relationships  . Social Herbalist on phone: Not on file    Gets together: Not on file    Attends religious service: Not on file    Active member of club or organization: Not on file    Attends meetings  of clubs or organizations: Not on file    Relationship status: Not on file  . Intimate partner violence    Fear of current or ex partner: Not on file    Emotionally abused: Not on file    Physically abused: Not on file    Forced sexual activity: Not on file  Other Topics Concern  . Not on file  Social History Narrative   Disabled    Married no kids    No guns, wears seat belt, feels safe in relationship    12th grade ed.       PHYSICAL EXAM  Vitals:   09/06/18 1024  BP: 116/73  Pulse: (!) 55  Temp: 98 F (36.7 C)  TempSrc: Oral  Weight: 115 lb 9.6 oz (52.4 kg)  Height: 5\' 5"  (1.651 m)   Body mass index is 19.24 kg/m.  Generalized: Well developed, in no acute distress  Cardiology: normal rate and rhythm, no murmur noted Neurological examination  Mentation: Alert oriented to time, place, history taking. Follows all commands speech and language fluent Cranial nerve II-XII: Pupils were equal round reactive to light. Extraocular movements were full, visual field were full on confrontational test. Facial sensation and strength were normal. Uvula tongue midline. Head turning and shoulder shrug  were normal and symmetric. Motor: The motor testing reveals 5 over 5 strength of all 4 extremities. Good symmetric motor tone is noted throughout.  Sensory: Sensory testing is intact to soft touch on all 4 extremities. No evidence of extinction is noted.  Coordination: Cerebellar testing  reveals good finger-nose-finger and heel-to-shin bilaterally.  Gait and station: Gait is normal.    DIAGNOSTIC DATA (LABS, IMAGING, TESTING) - I reviewed patient records, labs, notes, testing and imaging myself where available.  No flowsheet data found.   Lab Results  Component Value Date   WBC 8.5 06/13/2018   HGB 13.1 06/13/2018   HCT 38.6 06/13/2018   MCV 99.7 06/13/2018   PLT 148 (L) 06/13/2018      Component Value Date/Time   NA 141 06/13/2018 0258   NA 140 03/10/2018 1354   NA 139 08/28/2013 1805   K 2.9 (L) 06/13/2018 0258   K 3.5 08/28/2013 1805   CL 107 06/13/2018 0258   CL 101 08/28/2013 1805   CO2 25 06/13/2018 0258   CO2 34 (H) 08/28/2013 1805   GLUCOSE 102 (H) 06/13/2018 0258   GLUCOSE 91 08/28/2013 1805   BUN 12 06/13/2018 0258   BUN 13 03/10/2018 1354   BUN 13 08/28/2013 1805   CREATININE 0.81 06/13/2018 0258   CREATININE 1.27 08/28/2013 1805   CALCIUM 8.6 (L) 06/13/2018 0258   CALCIUM 8.8 08/28/2013 1805   PROT 8.0 06/12/2018 0647   PROT 7.6 06/22/2015 1053   PROT 7.8 08/28/2013 1805   ALBUMIN 4.0 06/12/2018 0647   ALBUMIN 4.7 06/22/2015 1053   ALBUMIN 4.2 08/28/2013 1805   AST 26 06/12/2018 0647   AST 23 08/28/2013 1805   ALT 13 06/12/2018 0647   ALT 16 08/28/2013 1805   ALKPHOS 48 06/12/2018 0647   ALKPHOS 64 08/28/2013 1805   BILITOT 0.3 06/12/2018 0647   BILITOT 0.3 06/22/2015 1053   BILITOT 0.3 08/28/2013 1805   GFRNONAA >60 06/13/2018 0258   GFRNONAA 47 (L) 08/28/2013 1805   GFRAA >60 06/13/2018 0258   GFRAA 54 (L) 08/28/2013 1805   Lab Results  Component Value Date   CHOL 152 06/12/2018   HDL 73 06/12/2018   LDLCALC 70 06/12/2018  TRIG 47 06/12/2018   CHOLHDL 2.1 06/12/2018   Lab Results  Component Value Date   HGBA1C 5.6 06/12/2018   No results found for: VITAMINB12 Lab Results  Component Value Date   TSH 1.81 08/05/2017     ASSESSMENT AND PLAN 63 y.o. year old female  has a past medical history of Allergy, Anxiety,  Arthritis, Chicken pox, COPD (chronic obstructive pulmonary disease) ([place]), Depression, GERD (gastroesophageal reflux disease), Headache, History of blood transfusion, Hyperlipidemia, Hypertension, Migraine, and Seizure ([place]). here with     ICD-10-CM   1. Seizures ([place])  R56.9     She is doing fairly well with divalproex 750mg  twice daily. She does note tremor (worse of left hand) but feels that it is manageable at this time. She will monitor closely and call with worsening. May consider divalproex ER or change of AED as indicated. She was advised to follow up with PCP (scheduled appt 8/5) for chronic disease management and consideration of cardiovascular workup d/t family history and abnormal EKG in hospital. She was encouraged to quit smoking. She verbalized understanding and agreement with plan. Follow up in 3 months.    No orders of the defined types were placed in this encounter.    No orders of the defined types were placed in this encounter.     I spent 15 minutes with the patient. 50% of this time was spent counseling and educating patient on plan of care and medications.    Debbora Presto, FNP-C 09/06/2018, 2:27 PM [facility] [address], [place], [place] [address] [phone]

## 2018-09-06 NOTE — Patient Instructions (Signed)
Continue Depakote 750mg  twice daily  Follow up in 3 months, sooner if needed  Seizure, Adult A seizure is a sudden burst of abnormal electrical activity in the brain. Seizures usually last from 30 seconds to 2 minutes. They can cause many different symptoms. Usually, seizures are not harmful unless they last a long time. What are the causes? Common causes of this condition include:  Fever or infection.  Conditions that affect the brain, such as: ? A brain abnormality that you were born with. ? A brain or head injury. ? Bleeding in the brain. ? A tumor. ? Stroke. ? Brain disorders such as autism or cerebral palsy.  Low blood sugar.  Conditions that are passed from parent to child (are inherited).  Problems with substances, such as: ? Having a reaction to a drug or a medicine. ? Suddenly stopping the use of a substance (withdrawal). In some cases, the cause may not be known. A person who has repeated seizures over time without a clear cause has a condition called epilepsy. What increases the risk? You are more likely to get this condition if you have:  A family history of epilepsy.  Had a seizure in the past.  A brain disorder.  A history of head injury, lack of oxygen at birth, or strokes. What are the signs or symptoms? There are many types of seizures. The symptoms vary depending on the type of seizure you have. Examples of symptoms during a seizure include:  Shaking (convulsions).  Stiffness in the body.  Passing out (losing consciousness).  Head nodding.  Staring.  Not responding to sound or touch.  Loss of bladder control and bowel control. Some people have symptoms right before and right after a seizure happens. Symptoms before a seizure may include:  Fear.  Worry (anxiety).  Feeling like you may vomit (nauseous).  Feeling like the room is spinning (vertigo).  Feeling like you saw or heard something before (dj vu).  Odd tastes or smells.   Changes in how you see. You may see flashing lights or spots. Symptoms after a seizure happens can include:  Confusion.  Sleepiness.  Headache.  Weakness on one side of the body. How is this treated? Most seizures will stop on their own in under 5 minutes. In these cases, no treatment is needed. Seizures that last longer than 5 minutes will usually need treatment. Treatment can include:  Medicines given through an IV tube.  Avoiding things that are known to cause your seizures. These can include medicines that you take for another condition.  Medicines to treat epilepsy.  Surgery to stop the seizures. This may be needed if medicines do not help. Follow these instructions at home: Medicines  Take over-the-counter and prescription medicines only as told by your doctor.  Do not eat or drink anything that may keep your medicine from working, such as alcohol. Activity  Do not do any activities that would be dangerous if you had another seizure, like driving or swimming. Wait until your doctor says it is safe for you to do them.  If you live in the [place] ask your local DMV (department of motor vehicles) when you can drive.  Get plenty of rest. Teaching others Teach friends and family what to do when you have a seizure. They should:  Lay you on the ground.  Protect your head and body.  Loosen any tight clothing around your neck.  Turn you on your side.  Not hold you down.  Not put  anything into your mouth.  Know whether or not you need emergency care.  Stay with you until you are better.  General instructions  Contact your doctor each time you have a seizure.  Avoid anything that gives you seizures.  Keep a seizure diary. Write down: ? What you think caused each seizure. ? What you remember about each seizure.  Keep all follow-up visits as told by your doctor. This is important. Contact a doctor if:  You have another seizure.  You have seizures more often.   There is any change in what happens during your seizures.  You keep having seizures with treatment.  You have symptoms of being sick or having an infection. Get help right away if:  You have a seizure that: ? Lasts longer than 5 minutes. ? Is different than seizures you had before. ? Makes it harder to breathe. ? Happens after you hurt your head.  You have any of these symptoms after a seizure: ? Not being able to speak. ? Not being able to use a part of your body. ? Confusion. ? A bad headache.  You have two or more seizures in a row.  You do not wake up right after a seizure.  You get hurt during a seizure. These symptoms may be an emergency. Do not wait to see if the symptoms will go away. Get medical help right away. Call your local emergency services (911 in the [place] Do not drive yourself to the hospital. Summary  Seizures usually last from 30 seconds to 2 minutes. Usually, they are not harmful unless they last a long time.  Do not eat or drink anything that may keep your medicine from working, such as alcohol.  Teach friends and family what to do when you have a seizure.  Contact your doctor each time you have a seizure. This information is not intended to replace advice given to you by your health care provider. Make sure you discuss any questions you have with your health care provider. Document Released: 07/09/2007 Document Revised: 04/09/2018 Document Reviewed: 04/09/2018 Elsevier Patient Education  [place].

## 2018-09-08 ENCOUNTER — Ambulatory Visit (INDEPENDENT_AMBULATORY_CARE_PROVIDER_SITE_OTHER): Payer: Medicare Other | Admitting: Family Medicine

## 2018-09-08 ENCOUNTER — Other Ambulatory Visit: Payer: Self-pay

## 2018-09-08 ENCOUNTER — Encounter: Payer: Self-pay | Admitting: Family Medicine

## 2018-09-08 VITALS — BP 117/80 | HR 64 | Temp 98.5°F | Ht 65.0 in | Wt 115.0 lb

## 2018-09-08 DIAGNOSIS — I2510 Atherosclerotic heart disease of native coronary artery without angina pectoris: Secondary | ICD-10-CM

## 2018-09-08 DIAGNOSIS — E7800 Pure hypercholesterolemia, unspecified: Secondary | ICD-10-CM

## 2018-09-08 DIAGNOSIS — R569 Unspecified convulsions: Secondary | ICD-10-CM

## 2018-09-08 DIAGNOSIS — Z0000 Encounter for general adult medical examination without abnormal findings: Secondary | ICD-10-CM | POA: Diagnosis not present

## 2018-09-08 DIAGNOSIS — R778 Other specified abnormalities of plasma proteins: Secondary | ICD-10-CM

## 2018-09-08 DIAGNOSIS — K219 Gastro-esophageal reflux disease without esophagitis: Secondary | ICD-10-CM

## 2018-09-08 DIAGNOSIS — Z1159 Encounter for screening for other viral diseases: Secondary | ICD-10-CM

## 2018-09-08 DIAGNOSIS — R7989 Other specified abnormal findings of blood chemistry: Secondary | ICD-10-CM

## 2018-09-08 DIAGNOSIS — I10 Essential (primary) hypertension: Secondary | ICD-10-CM

## 2018-09-08 LAB — UA/M W/RFLX CULTURE, ROUTINE
Bilirubin, UA: NEGATIVE
Glucose, UA: NEGATIVE
Leukocytes,UA: NEGATIVE
Nitrite, UA: NEGATIVE
Protein,UA: NEGATIVE
Specific Gravity, UA: 1.02 (ref 1.005–1.030)
Urobilinogen, Ur: 0.2 mg/dL (ref 0.2–1.0)
pH, UA: 8.5 — ABNORMAL HIGH (ref 5.0–7.5)

## 2018-09-08 LAB — MICROSCOPIC EXAMINATION
Bacteria, UA: NONE SEEN
Mucus, UA: NONE SEEN

## 2018-09-08 NOTE — Progress Notes (Signed)
I reviewed note and agree with plan.   Kayla Bombard, MD 02/03/313, 94:58 AM Certified in Neurology, Neurophysiology and Neuroimaging  [facility] [address] [place], [place] [address] [phone]

## 2018-09-08 NOTE — Assessment & Plan Note (Signed)
Discussed repeating the troponin labs and doing an EKG today or referring urgently to Cardiology for further workup given the confusion during admission and lack of further testing. Patient declines any action at this time, states she's feeling fine and will continue to monitor things.

## 2018-09-08 NOTE — Progress Notes (Signed)
BP 117/80 (BP Location: Left Arm, Patient Position: Sitting, Cuff Size: Normal)   Pulse 64   Temp 98.5 F (36.9 C) (Oral)   Ht 5\' 5"  (1.651 m)   Wt 115 lb (52.2 kg)   SpO2 98%   BMI 19.14 kg/m    Subjective:    Patient ID: Kayla Shah, female    DOB: 06-26-1955, 63 y.o.   MRN: [id number]  HPI: Kayla Shah is a 63 y.o. female presenting on 09/08/2018 for comprehensive medical examination. Current medical complaints include:see below  Patient notes improvement in her constipation. Not taking the linzess, figured out an OTC concoction that seems to be working for her. Now going consistently several times weekly and feeling much better. Denies abdominal pain, rectal bleeding, fevers.   Neurologist was concerned about her episode of elevated troponin during her admission in May. Per patient there was confusion about her discharge, Cardiologist that rounded on her had wanted to do further tests but then she got dismissed by Hospitalist team prior to testing and was called after getting back home and they had wanted her to come back in for the testing but she declined at that point. Patient declines any episodes of CP, SOB, diaphoresis at any point and the elevation was thought to be d/t active seizures. Currently managed on depakote for seizure disorder. No recent seizures since her hospitalization.   HLD - on simvastatin, tolerating well without myalgias, fatigue. Trying to watch what she eats. Does not exercise.   HTN - on verapamil which is working well. Denies any side effects, CP, SOB, HAs, dizziness. Does not check home readings as "it's always good when checked".   GERD stable on protonix regimen, no breakthrough sxs.   She currently lives with: Menopausal Symptoms: no  Depression Screen done today and results listed below:  Depression screen [facility] 2/9 09/08/2018 03/10/2018 11/12/2017 10/08/2017 07/30/2017  Decreased Interest 0 1 1 0 3  Down, Depressed, Hopeless 0 1 0 0 3  PHQ  - 2 Score 0 2 1 0 6  Altered sleeping 0 1 0 - 3  Tired, decreased energy 0 0 0 - 3  Change in appetite 0 0 0 - 3  Feeling bad or failure about yourself  0 0 0 - 3  Trouble concentrating 1 0 0 - 3  Moving slowly or fidgety/restless 0 0 0 - 3  Suicidal thoughts 0 0 0 - 0  PHQ-9 Score 1 3 1  - 24  Difficult doing work/chores - - Not difficult at all - Extremely dIfficult    The patient does not have a history of falls. I did complete a risk assessment for falls. A plan of care for falls was documented.   Past Medical History:  Past Medical History:  Diagnosis Date  . Allergy   . Anxiety    agoraphobia   . Arthritis   . Chicken pox   . COPD (chronic obstructive pulmonary disease) ([place])   . Depression   . GERD (gastroesophageal reflux disease)   . Headache   . History of blood transfusion    1984  . Hyperlipidemia   . Hypertension   . Migraine   . Seizure [facility])    was on keppra    Surgical History:  Past Surgical History:  Procedure Laterality Date  . ABDOMINAL HYSTERECTOMY  1984   endometriosis  . APPENDECTOMY    . COLONOSCOPY WITH PROPOFOL N/A 03/26/2017   Procedure: COLONOSCOPY WITH PROPOFOL;  Surgeon: Jonathon Bellows,  MD;  Location: [facility] ENDOSCOPY;  Service: Gastroenterology;  Laterality: N/A;  . WISDOM TOOTH EXTRACTION      Medications:  Current Outpatient Medications on File Prior to Visit  Medication Sig  . Calcium-Magnesium-Vitamin D (CALCIUM 500 PO) Take 1 tablet by mouth daily.   . cholecalciferol (VITAMIN D) 1000 units tablet Take 1,000 Units by mouth daily.  . divalproex (DEPAKOTE) 250 MG DR tablet Take 3 tablets (750 mg total) by mouth 2 (two) times daily.  Marland Kitchen gabapentin (NEURONTIN) 300 MG capsule Take 1 capsule (300 mg total) by mouth 2 (two) times daily.  Marland Kitchen ibuprofen (ADVIL) 200 MG tablet Take 200 mg by mouth every 6 (six) hours as needed.  . ondansetron (ZOFRAN ODT) 4 MG disintegrating tablet Take 1 tablet (4 mg total) by mouth every 8 (eight) hours as  needed.  . pantoprazole (PROTONIX) 40 MG tablet TAKE 1 TABLET BY MOUTH TWICE A DAY  . simvastatin (ZOCOR) 20 MG tablet TAKE 1 TABLET BY MOUTH DAILY  . verapamil (CALAN-SR) 240 MG CR tablet Take 1 tablet (240 mg total) by mouth daily.   No current facility-administered medications on file prior to visit.     Allergies:  Allergies  Allergen Reactions  . Paroxetine Hcl Other (See Comments)    Pt. Becomes very aggressive with mood swings.  . Codeine Nausea And Vomiting  . Fluoxetine Anxiety  . Paroxetine Anxiety    Social History:  Social History   Socioeconomic History  . Marital status: Divorced    Spouse name: Legrand Como  . Number of children: 0  . Years of education: 45  . Highest education level: Not on file  Occupational History    Comment: disabled  Social Needs  . Financial resource strain: Not on file  . Food insecurity    Worry: Not on file    Inability: Not on file  . Transportation needs    Medical: Not on file    Non-medical: Not on file  Tobacco Use  . Smoking status: Current Every Day Smoker    Packs/day: 0.25    Years: 45.00    Pack years: 11.25    Types: Cigarettes  . Smokeless tobacco: Never Used  Substance and Sexual Activity  . Alcohol use: No  . Drug use: Yes    Types: Marijuana  . Sexual activity: Never  Lifestyle  . Physical activity    Days per week: Not on file    Minutes per session: Not on file  . Stress: Not on file  Relationships  . Social Herbalist on phone: Not on file    Gets together: Not on file    Attends religious service: Not on file    Active member of club or organization: Not on file    Attends meetings of clubs or organizations: Not on file    Relationship status: Not on file  . Intimate partner violence    Fear of current or ex partner: Not on file    Emotionally abused: Not on file    Physically abused: Not on file    Forced sexual activity: Not on file  Other Topics Concern  . Not on file  Social  History Narrative   Disabled    Married no kids    No guns, wears seat belt, feels safe in relationship    12th grade ed.    Social History   Tobacco Use  Smoking Status Current Every Day Smoker  . Packs/day: 0.25  .  Years: 45.00  . Pack years: 11.25  . Types: Cigarettes  Smokeless Tobacco Never Used   Social History   Substance and Sexual Activity  Alcohol Use No    Family History:  Family History  Problem Relation Age of Onset  . Stroke Mother   . Depression Mother   . Arthritis Mother   . Heart disease Father   . Alcohol abuse Father   . Diabetes Father   . Osteoporosis Sister   . Kidney disease Sister   . Arthritis Sister   . Arthritis Maternal Grandmother   . Heart disease Maternal Grandmother   . Hyperlipidemia Maternal Grandmother   . Diabetes Paternal Grandmother   . Breast cancer Neg Hx     Past medical history, surgical history, medications, allergies, family history and social history reviewed with patient today and changes made to appropriate areas of the chart.   Review of Systems - General ROS: negative Psychological ROS: negative Ophthalmic ROS: negative ENT ROS: negative Allergy and Immunology ROS: negative Hematological and Lymphatic ROS: negative Endocrine ROS: negative Breast ROS: negative for breast lumps Respiratory ROS: no cough, shortness of breath, or wheezing Cardiovascular ROS: no chest pain or dyspnea on exertion Gastrointestinal ROS: no abdominal pain, change in bowel habits, or black or bloody stools Genito-Urinary ROS: no dysuria, trouble voiding, or hematuria Musculoskeletal ROS: negative Neurological ROS: no TIA or stroke symptoms Dermatological ROS: negative All other ROS negative except what is listed above and in the HPI.      Objective:    BP 117/80 (BP Location: Left Arm, Patient Position: Sitting, Cuff Size: Normal)   Pulse 64   Temp 98.5 F (36.9 C) (Oral)   Ht 5\' 5"  (1.651 m)   Wt 115 lb (52.2 kg)   SpO2 98%    BMI 19.14 kg/m   Wt Readings from Last 3 Encounters:  09/08/18 115 lb (52.2 kg)  09/06/18 115 lb 9.6 oz (52.4 kg)  07/29/18 117 lb (53.1 kg)    Physical Exam  Results for orders placed or performed in visit on 09/08/18  Microscopic Examination   URINE  Result Value Ref Range   WBC, UA 0-5 0 - 5 /hpf   RBC 0-2 0 - 2 /hpf   Epithelial Cells (non renal) 0-10 0 - 10 /hpf   Crystals Present N/A   Crystal Type Amorphous Sediment N/A   Mucus, UA None seen Not Estab.   Bacteria, UA None seen None seen/Few  Hepatitis C Antibody  Result Value Ref Range   Hep C Virus Ab <0.1 0.0 - 0.9 s/co ratio  CBC with Differential/Platelet  Result Value Ref Range   WBC 6.4 3.4 - 10.8 x10E3/uL   RBC 3.68 (L) 3.77 - 5.28 x10E6/uL   Hemoglobin 12.7 11.1 - 15.9 g/dL   Hematocrit 37.9 34.0 - 46.6 %   MCV 103 (H) 79 - 97 fL   MCH 34.5 (H) 26.6 - 33.0 pg   MCHC 33.5 31.5 - 35.7 g/dL   RDW 13.7 11.7 - 15.4 %   Platelets 123 (L) 150 - 450 x10E3/uL   Neutrophils 39 Not Estab. %   Lymphs 51 Not Estab. %   Monocytes 7 Not Estab. %   Eos 1 Not Estab. %   Basos 1 Not Estab. %   Neutrophils Absolute 2.5 1.4 - 7.0 x10E3/uL   Lymphocytes Absolute 3.3 (H) 0.7 - 3.1 x10E3/uL   Monocytes Absolute 0.4 0.1 - 0.9 x10E3/uL   EOS (ABSOLUTE) 0.1 0.0 - 0.4  x10E3/uL   Basophils Absolute 0.0 0.0 - 0.2 x10E3/uL   Immature Granulocytes 1 Not Estab. %   Immature Grans (Abs) 0.0 0.0 - 0.1 x10E3/uL  Comprehensive metabolic panel  Result Value Ref Range   Glucose 86 65 - 99 mg/dL   BUN 18 8 - 27 mg/dL   Creatinine, Ser 0.86 0.57 - 1.00 mg/dL   GFR calc non Af Amer 73 >59 mL/min/1.73   GFR calc Af Amer 84 >59 mL/min/1.73   BUN/Creatinine Ratio 21 12 - 28   Sodium 142 134 - 144 mmol/L   Potassium 4.2 3.5 - 5.2 mmol/L   Chloride 100 96 - 106 mmol/L   CO2 28 20 - 29 mmol/L   Calcium 9.0 8.7 - 10.3 mg/dL   Total Protein 6.6 6.0 - 8.5 g/dL   Albumin 4.2 3.8 - 4.8 g/dL   Globulin, Total 2.4 1.5 - 4.5 g/dL    Albumin/Globulin Ratio 1.8 1.2 - 2.2   Bilirubin Total <0.2 0.0 - 1.2 mg/dL   Alkaline Phosphatase 45 39 - 117 IU/L   AST 22 0 - 40 IU/L   ALT 20 0 - 32 IU/L  Lipid Panel w/o Chol/HDL Ratio  Result Value Ref Range   Cholesterol, Total 152 100 - 199 mg/dL   Triglycerides 97 0 - 149 mg/dL   HDL 77 >39 mg/dL   VLDL Cholesterol Cal 19 5 - 40 mg/dL   LDL Calculated 56 0 - 99 mg/dL  UA/M w/rflx Culture, Routine   Specimen: Urine   URINE  Result Value Ref Range   Specific Gravity, UA 1.020 1.005 - 1.030   pH, UA 8.5 (H) 5.0 - 7.5   Color, UA Yellow Yellow   Appearance Ur Hazy (A) Clear   Leukocytes,UA Negative Negative   Protein,UA Negative Negative/Trace   Glucose, UA Negative Negative   Ketones, UA Trace (A) Negative   RBC, UA Trace (A) Negative   Bilirubin, UA Negative Negative   Urobilinogen, Ur 0.2 0.2 - 1.0 mg/dL   Nitrite, UA Negative Negative   Microscopic Examination See below:       Assessment & Plan:   Problem List Items Addressed This Visit      Cardiovascular and Mediastinum   Essential (primary) hypertension - Primary    Stable and WNL, continue current regimen and good lifestyle habits      Relevant Orders   CBC with Differential/Platelet (Completed)   Comprehensive metabolic panel (Completed)   UA/M w/rflx Culture, Routine (Completed)   CAD (coronary artery disease)    Recheck lipids, adjust as needed. No anginal sxs reported      Relevant Orders   Lipid Panel w/o Chol/HDL Ratio (Completed)     Digestive   Gastroesophageal reflux disease without esophagitis    Stable on protonix regimen, continue        Other   Pure hypercholesterolemia    Recheck lipids, adjust as needed. Continue current regimen      Relevant Orders   Lipid Panel w/o Chol/HDL Ratio (Completed)   Seizures ([place])    Followed by Neurology, continue per their recommendations      Elevated troponin    Discussed repeating the troponin labs and doing an EKG today or referring  urgently to Cardiology for further workup given the confusion during admission and lack of further testing. Patient declines any action at this time, states she's feeling fine and will continue to monitor things.        Other Visit Diagnoses  Annual physical exam       Need for hepatitis C screening test       Relevant Orders   Hepatitis C Antibody (Completed)       Follow up plan: Return in about 6 months (around 03/11/2019) for 6 month f/u.   LABORATORY TESTING:  - Pap smear: not applicable  IMMUNIZATIONS:   - Tdap: Tetanus vaccination status reviewed: refused. - Influenza: Postponed to flu season - Pneumovax: Not applicable - Prevnar: Not applicable - HPV: Not applicable - Zostavax vaccine: Refused  SCREENING: -Mammogram: Up to date  - Colonoscopy: Up to date   PATIENT COUNSELING:   Advised to take 1 mg of folate supplement per day if capable of pregnancy.   Sexuality: Discussed sexually transmitted diseases, partner selection, use of condoms, avoidance of unintended pregnancy  and contraceptive alternatives.   Advised to avoid cigarette smoking.  I discussed with the patient that most people either abstain from alcohol or drink within safe limits (<=14/week and <=4 drinks/occasion for males, <=7/weeks and <= 3 drinks/occasion for females) and that the risk for alcohol disorders and other health effects rises proportionally with the number of drinks per week and how often a drinker exceeds daily limits.  Discussed cessation/primary prevention of drug use and availability of treatment for abuse.   Diet: Encouraged to adjust caloric intake to maintain  or achieve ideal body weight, to reduce intake of dietary saturated fat and total fat, to limit sodium intake by avoiding high sodium foods and not adding table salt, and to maintain adequate dietary potassium and calcium preferably from fresh fruits, vegetables, and low-fat dairy products.    stressed the importance of  regular exercise  Injury prevention: Discussed safety belts, safety helmets, smoke detector, smoking near bedding or upholstery.   Dental health: Discussed importance of regular tooth brushing, flossing, and dental visits.    NEXT PREVENTATIVE PHYSICAL DUE IN 1 YEAR. Return in about 6 months (around 03/11/2019) for 6 month f/u.

## 2018-09-09 LAB — COMPREHENSIVE METABOLIC PANEL
ALT: 20 IU/L (ref 0–32)
AST: 22 IU/L (ref 0–40)
Albumin/Globulin Ratio: 1.8 (ref 1.2–2.2)
Albumin: 4.2 g/dL (ref 3.8–4.8)
Alkaline Phosphatase: 45 IU/L (ref 39–117)
BUN/Creatinine Ratio: 21 (ref 12–28)
BUN: 18 mg/dL (ref 8–27)
Bilirubin Total: <0.2 mg/dL (ref 0.0–1.2)
CO2: 28 mmol/L (ref 20–29)
Calcium: 9 mg/dL (ref 8.7–10.3)
Chloride: 100 mmol/L (ref 96–106)
Creatinine, Ser: 0.86 mg/dL (ref 0.57–1.00)
GFR calc Af Amer: 84 mL/min/{1.73_m2} (ref >59)
GFR calc non Af Amer: 73 mL/min/{1.73_m2} (ref >59)
Globulin, Total: 2.4 g/dL (ref 1.5–4.5)
Glucose: 86 mg/dL (ref 65–99)
Potassium: 4.2 mmol/L (ref 3.5–5.2)
Sodium: 142 mmol/L (ref 134–144)
Total Protein: 6.6 g/dL (ref 6.0–8.5)

## 2018-09-09 LAB — CBC WITH DIFFERENTIAL/PLATELET
Basophils Absolute: 0 10*3/uL (ref 0.0–0.2)
Basos: 1 %
EOS (ABSOLUTE): 0.1 10*3/uL (ref 0.0–0.4)
Eos: 1 %
Hematocrit: 37.9 % (ref 34.0–46.6)
Hemoglobin: 12.7 g/dL (ref 11.1–15.9)
Immature Grans (Abs): 0 10*3/uL (ref 0.0–0.1)
Immature Granulocytes: 1 %
Lymphocytes Absolute: 3.3 10*3/uL — ABNORMAL HIGH (ref 0.7–3.1)
Lymphs: 51 %
MCH: 34.5 pg — ABNORMAL HIGH (ref 26.6–33.0)
MCHC: 33.5 g/dL (ref 31.5–35.7)
MCV: 103 fL — ABNORMAL HIGH (ref 79–97)
Monocytes Absolute: 0.4 10*3/uL (ref 0.1–0.9)
Monocytes: 7 %
Neutrophils Absolute: 2.5 10*3/uL (ref 1.4–7.0)
Neutrophils: 39 %
Platelets: 123 10*3/uL — ABNORMAL LOW (ref 150–450)
RBC: 3.68 x10E6/uL — ABNORMAL LOW (ref 3.77–5.28)
RDW: 13.7 % (ref 11.7–15.4)
WBC: 6.4 10*3/uL (ref 3.4–10.8)

## 2018-09-09 LAB — LIPID PANEL W/O CHOL/HDL RATIO
Cholesterol, Total: 152 mg/dL (ref 100–199)
HDL: 77 mg/dL (ref >39)
LDL Calculated: 56 mg/dL (ref 0–99)
Triglycerides: 97 mg/dL (ref 0–149)
VLDL Cholesterol Cal: 19 mg/dL (ref 5–40)

## 2018-09-09 LAB — HEPATITIS C ANTIBODY: Hep C Virus Ab: <0.1 s/co ratio (ref 0.0–0.9)

## 2018-09-13 NOTE — Assessment & Plan Note (Signed)
Recheck lipids, adjust as needed. Continue current regimen 

## 2018-09-13 NOTE — Assessment & Plan Note (Signed)
Recheck lipids, adjust as needed. No anginal sxs reported

## 2018-09-13 NOTE — Assessment & Plan Note (Signed)
Followed by Neurology, continue per their recommendations 

## 2018-09-13 NOTE — Assessment & Plan Note (Signed)
Stable and WNL, continue current regimen and good lifestyle habits

## 2018-09-13 NOTE — Assessment & Plan Note (Signed)
Stable on protonix regimen, continue 

## 2018-09-14 ENCOUNTER — Telehealth: Payer: Self-pay | Admitting: Family Medicine

## 2018-09-14 NOTE — Telephone Encounter (Signed)
Noted! Thank you

## 2018-09-14 NOTE — Telephone Encounter (Signed)
Pt has called to inform that the increase on her gabapentin (NEURONTIN) 300 MG  Is too much and causes her to sleep too much.  Pt states if shaking returns she will go back to the twice a day but for now she will not.  Pt has not requested a call back

## 2018-10-26 ENCOUNTER — Telehealth: Payer: Self-pay

## 2018-10-26 NOTE — Telephone Encounter (Signed)
Spoke with pt to inform her it is time for her annual lung cancer screening. Confirmed pt's smoking history (current smoker, 30 years, 1/2 ppd). Pt denies any major health changes in the last year. Pt is agreeable to having CT scan scheduled at this time, but states that it does depend on her husband's schedule which she says she is not sure of right now.

## 2018-11-04 ENCOUNTER — Telehealth: Payer: Self-pay | Admitting: *Deleted

## 2018-11-04 NOTE — Telephone Encounter (Signed)
Left message for patient to notify them that it is time to schedule annual low dose lung cancer screening CT scan. Instructed patient to call back to verify information prior to the scan being scheduled.  

## 2018-11-09 ENCOUNTER — Other Ambulatory Visit: Payer: Self-pay | Admitting: Family Medicine

## 2018-11-09 DIAGNOSIS — G8929 Other chronic pain: Secondary | ICD-10-CM

## 2018-11-12 ENCOUNTER — Other Ambulatory Visit: Payer: Self-pay | Admitting: Family Medicine

## 2018-11-12 DIAGNOSIS — E7800 Pure hypercholesterolemia, unspecified: Secondary | ICD-10-CM

## 2018-12-07 ENCOUNTER — Ambulatory Visit: Payer: Medicare Other | Admitting: Family Medicine

## 2018-12-14 ENCOUNTER — Other Ambulatory Visit: Payer: Self-pay | Admitting: Family Medicine

## 2018-12-14 DIAGNOSIS — I10 Essential (primary) hypertension: Secondary | ICD-10-CM

## 2018-12-14 NOTE — Telephone Encounter (Signed)
Requested medication (s) are due for refill today: yes  Requested medication (s) are on the active medication list: yes  Last refill:  09/07/2018  Future visit scheduled: yes  Notes to clinic:  Review for refill Overdue for office visit    Requested Prescriptions  Pending Prescriptions Disp Refills   verapamil (CALAN-SR) 240 MG CR tablet [Pharmacy Med Name: VERAPAMIL HCL ER 240 MG TAB] 90 tablet 3    Sig: TAKE 1 TABLET BY MOUTH DAILY     Cardiovascular:  Calcium Channel Blockers Passed - 12/14/2018  1:29 PM      Passed - Last BP in normal range    BP Readings from Last 1 Encounters:  09/08/18 117/80         Passed - Valid encounter within last 6 months    Recent Outpatient Visits          3 months ago Essential (primary) hypertension   [place], [place], [place]   4 months ago Constipation, unspecified constipation type   [facility], [place], [place]   5 months ago Migraine without aura and without status migrainosus, not intractable   [place], [place]   9 months ago Need for hepatitis C screening test   [facility] [place], [place]   1 year ago Seizures [facility])   [place], [place]      Future Appointments            In 2 months Orene Desanctis, Lilia Argue, [place], [place]

## 2018-12-14 NOTE — Telephone Encounter (Signed)
Routing to provider  

## 2019-01-12 ENCOUNTER — Other Ambulatory Visit: Payer: Self-pay | Admitting: Family Medicine

## 2019-01-12 DIAGNOSIS — K219 Gastro-esophageal reflux disease without esophagitis: Secondary | ICD-10-CM

## 2019-02-22 ENCOUNTER — Encounter: Payer: Self-pay | Admitting: *Deleted

## 2019-03-02 ENCOUNTER — Other Ambulatory Visit: Payer: Self-pay | Admitting: Family Medicine

## 2019-03-02 DIAGNOSIS — Z1231 Encounter for screening mammogram for malignant neoplasm of breast: Secondary | ICD-10-CM

## 2019-03-11 ENCOUNTER — Ambulatory Visit: Payer: Medicare Other | Admitting: Family Medicine

## 2019-03-15 ENCOUNTER — Ambulatory Visit: Payer: Medicare Other | Admitting: Family Medicine

## 2019-03-18 ENCOUNTER — Encounter: Payer: Self-pay | Admitting: Family Medicine

## 2019-03-18 ENCOUNTER — Other Ambulatory Visit: Payer: Self-pay

## 2019-03-18 ENCOUNTER — Ambulatory Visit (INDEPENDENT_AMBULATORY_CARE_PROVIDER_SITE_OTHER): Payer: Medicare Other | Admitting: Family Medicine

## 2019-03-18 ENCOUNTER — Other Ambulatory Visit: Payer: Self-pay | Admitting: Family Medicine

## 2019-03-18 VITALS — BP 114/83 | HR 65 | Temp 97.4°F | Ht 65.0 in | Wt 117.0 lb

## 2019-03-18 DIAGNOSIS — F332 Major depressive disorder, recurrent severe without psychotic features: Secondary | ICD-10-CM | POA: Diagnosis not present

## 2019-03-18 DIAGNOSIS — I2510 Atherosclerotic heart disease of native coronary artery without angina pectoris: Secondary | ICD-10-CM | POA: Diagnosis not present

## 2019-03-18 DIAGNOSIS — J449 Chronic obstructive pulmonary disease, unspecified: Secondary | ICD-10-CM

## 2019-03-18 DIAGNOSIS — R569 Unspecified convulsions: Secondary | ICD-10-CM

## 2019-03-18 DIAGNOSIS — M810 Age-related osteoporosis without current pathological fracture: Secondary | ICD-10-CM

## 2019-03-18 DIAGNOSIS — I10 Essential (primary) hypertension: Secondary | ICD-10-CM

## 2019-03-18 DIAGNOSIS — E7800 Pure hypercholesterolemia, unspecified: Secondary | ICD-10-CM

## 2019-03-18 DIAGNOSIS — J4489 Other specified chronic obstructive pulmonary disease: Secondary | ICD-10-CM

## 2019-03-18 MED ORDER — MEGESTROL ACETATE 20 MG PO TABS: 20.0000 mg | ORAL_TABLET | Freq: Every day | ORAL | 0 refills | Status: DC

## 2019-03-18 NOTE — Assessment & Plan Note (Signed)
BPs stable and WNL, continue current regimen 

## 2019-03-18 NOTE — Assessment & Plan Note (Signed)
Stable without exacerbations.

## 2019-03-18 NOTE — Assessment & Plan Note (Signed)
Not under good control but declines medications or counseling. Will work on increasing appetite with megace and continue to closely monitor. Adamantly denies SI/HI.

## 2019-03-18 NOTE — Progress Notes (Signed)
BP 114/83   Pulse 65   Temp (!) 97.4 F (36.3 C) (Oral)   Ht 5\' 5"  (1.651 m)   Wt 117 lb (53.1 kg)   SpO2 99%   BMI 19.47 kg/m    Subjective:    Patient ID: Kayla Shah, female    DOB: [birth year], 64 y.o.   MRN: [id number]  HPI: Kayla Shah is a 64 y.o. female  Chief Complaint  Patient presents with  . Hypertension  . Hyperlipidemia   Patient presenting today for 6 month f/u chronic conditions.   HTN - Taking medications faithfully without side effects. Denies CP, SOB, HAs, dizziness. Does not check home BPs often but WNL when checked around 120/80  HLD - Taking simvastatin without issue. Denies claudication, myalgias. Tries to eat healthy foods, does not exercise.   Anxiety and depression - Feels she's quite depressed but declines medication due to past issues with numerous medications tried. Feels much of her appetite issues come from her moods, never feels like eating but does eat "enough" per patient. At least 1-2 meals daily.   Seizures/tremors - Followed by Neurology. On depakote and gabapentin currently which cause significant sedation. Has f/u next month and plans to discuss this with specialist. No recent seizure activity.   Osteoporosis - On OTC calcium and vit D. No issue with fractures.   Depression screen [facility] 2/9 09/08/2018 03/10/2018 11/12/2017  Decreased Interest 0 1 1  Down, Depressed, Hopeless 0 1 0  PHQ - 2 Score 0 2 1  Altered sleeping 0 1 0  Tired, decreased energy 0 0 0  Change in appetite 0 0 0  Feeling bad or failure about yourself  0 0 0  Trouble concentrating 1 0 0  Moving slowly or fidgety/restless 0 0 0  Suicidal thoughts 0 0 0  PHQ-9 Score 1 3 1   Difficult doing work/chores - - Not difficult at all   GAD 7 : Generalized Anxiety Score 09/08/2018 03/10/2018 11/12/2017  Nervous, Anxious, on Edge 2 3 1   Control/stop worrying 2 3 1   Worry too much - different things 1 3 1   Trouble relaxing 1 3 1   Restless 1 3 0  Easily annoyed or  irritable 1 1 1   Afraid - awful might happen 0 1 0  Total GAD 7 Score 8 17 5   Anxiety Difficulty Somewhat difficult - Not difficult at all   Relevant past medical, surgical, family and social history reviewed and updated as indicated. Interim medical history since our last visit reviewed. Allergies and medications reviewed and updated.  Review of Systems  Per HPI unless specifically indicated above     Objective:    BP 114/83   Pulse 65   Temp (!) 97.4 F (36.3 C) (Oral)   Ht 5\' 5"  (1.651 m)   Wt 117 lb (53.1 kg)   SpO2 99%   BMI 19.47 kg/m   Wt Readings from Last 3 Encounters:  03/18/19 117 lb (53.1 kg)  09/08/18 115 lb (52.2 kg)  09/06/18 115 lb 9.6 oz (52.4 kg)    Physical Exam Vitals and nursing note reviewed.  Constitutional:      Appearance: Normal appearance. She is not ill-appearing.     Comments: underweight  HENT:     Head: Atraumatic.  Eyes:     Extraocular Movements: Extraocular movements intact.     Conjunctiva/sclera: Conjunctivae normal.  Cardiovascular:     Rate and Rhythm: Normal rate and regular rhythm.  Heart sounds: Normal heart sounds.  Pulmonary:     Effort: Pulmonary effort is normal.     Breath sounds: Normal breath sounds.  Musculoskeletal:        General: Normal range of motion.     Cervical back: Normal range of motion and neck supple.  Skin:    General: Skin is warm and dry.  Neurological:     Mental Status: She is alert and oriented to person, place, and time.  Psychiatric:        Mood and Affect: Mood normal.        Thought Content: Thought content normal.        Judgment: Judgment normal.     Results for orders placed or performed in visit on 09/08/18  Microscopic Examination   URINE  Result Value Ref Range   WBC, UA 0-5 0 - 5 /hpf   RBC 0-2 0 - 2 /hpf   Epithelial Cells (non renal) 0-10 0 - 10 /hpf   Crystals Present N/A   Crystal Type Amorphous Sediment N/A   Mucus, UA None seen Not Estab.   Bacteria, UA None  seen None seen/Few  Hepatitis C Antibody  Result Value Ref Range   Hep C Virus Ab <0.1 0.0 - 0.9 s/co ratio  CBC with Differential/Platelet  Result Value Ref Range   WBC 6.4 3.4 - 10.8 x10E3/uL   RBC 3.68 (L) 3.77 - 5.28 x10E6/uL   Hemoglobin 12.7 11.1 - 15.9 g/dL   Hematocrit 37.9 34.0 - 46.6 %   MCV 103 (H) 79 - 97 fL   MCH 34.5 (H) 26.6 - 33.0 pg   MCHC 33.5 31.5 - 35.7 g/dL   RDW 13.7 11.7 - 15.4 %   Platelets 123 (L) 150 - 450 x10E3/uL   Neutrophils 39 Not Estab. %   Lymphs 51 Not Estab. %   Monocytes 7 Not Estab. %   Eos 1 Not Estab. %   Basos 1 Not Estab. %   Neutrophils Absolute 2.5 1.4 - 7.0 x10E3/uL   Lymphocytes Absolute 3.3 (H) 0.7 - 3.1 x10E3/uL   Monocytes Absolute 0.4 0.1 - 0.9 x10E3/uL   EOS (ABSOLUTE) 0.1 0.0 - 0.4 x10E3/uL   Basophils Absolute 0.0 0.0 - 0.2 x10E3/uL   Immature Granulocytes 1 Not Estab. %   Immature Grans (Abs) 0.0 0.0 - 0.1 x10E3/uL  Comprehensive metabolic panel  Result Value Ref Range   Glucose 86 65 - 99 mg/dL   BUN 18 8 - 27 mg/dL   Creatinine, Ser 0.86 0.57 - 1.00 mg/dL   GFR calc non Af Amer 73 >59 mL/min/1.73   GFR calc Af Amer 84 >59 mL/min/1.73   BUN/Creatinine Ratio 21 12 - 28   Sodium 142 134 - 144 mmol/L   Potassium 4.2 3.5 - 5.2 mmol/L   Chloride 100 96 - 106 mmol/L   CO2 28 20 - 29 mmol/L   Calcium 9.0 8.7 - 10.3 mg/dL   Total Protein 6.6 6.0 - 8.5 g/dL   Albumin 4.2 3.8 - 4.8 g/dL   Globulin, Total 2.4 1.5 - 4.5 g/dL   Albumin/Globulin Ratio 1.8 1.2 - 2.2   Bilirubin Total <0.2 0.0 - 1.2 mg/dL   Alkaline Phosphatase 45 39 - 117 IU/L   AST 22 0 - 40 IU/L   ALT 20 0 - 32 IU/L  Lipid Panel w/o Chol/HDL Ratio  Result Value Ref Range   Cholesterol, Total 152 100 - 199 mg/dL   Triglycerides 97 0 - 149  mg/dL   HDL 77 >39 mg/dL   VLDL Cholesterol Cal 19 5 - 40 mg/dL   LDL Calculated 56 0 - 99 mg/dL  UA/M w/rflx Culture, Routine   Specimen: Urine   URINE  Result Value Ref Range   Specific Gravity, UA 1.020 1.005 -  1.030   pH, UA 8.5 (H) 5.0 - 7.5   Color, UA Yellow Yellow   Appearance Ur Hazy (A) Clear   Leukocytes,UA Negative Negative   Protein,UA Negative Negative/Trace   Glucose, UA Negative Negative   Ketones, UA Trace (A) Negative   RBC, UA Trace (A) Negative   Bilirubin, UA Negative Negative   Urobilinogen, Ur 0.2 0.2 - 1.0 mg/dL   Nitrite, UA Negative Negative   Microscopic Examination See below:       Assessment & Plan:   Problem List Items Addressed This Visit      Cardiovascular and Mediastinum   Essential (primary) hypertension - Primary    BPs stable and WNL, continue current regimen      Relevant Orders   Comprehensive metabolic panel   CAD (coronary artery disease)    No anginal episodes. Recheck lipids, continue current regimen and lifestyle modifications      Relevant Orders   Lipid Panel w/o Chol/HDL Ratio     Respiratory   COPD (chronic obstructive pulmonary disease) with chronic bronchitis (HCC)    Stable without exacerbations.         Musculoskeletal and Integument   Osteoporosis    Stable, continue OTC vitamin regimen and work on increasing weight bearing exercise        Other   Depression    Not under good control but declines medications or counseling. Will work on increasing appetite with megace and continue to closely monitor. Adamantly denies SI/HI.       Pure hypercholesterolemia    Recheck lipids, adjust as needed. Continue current regimen      Relevant Orders   Lipid Panel w/o Chol/HDL Ratio   Seizures ([place])    Followed by Neurology. Experiencing sedation with current regimen and plans to address this at upcoming f/u next month          Follow up plan: Return in about 6 months (around 09/15/2019) for CPE.

## 2019-03-18 NOTE — Assessment & Plan Note (Signed)
Stable, continue OTC vitamin regimen and work on increasing weight bearing exercise

## 2019-03-18 NOTE — Assessment & Plan Note (Signed)
Followed by Neurology. Experiencing sedation with current regimen and plans to address this at upcoming f/u next month

## 2019-03-18 NOTE — Assessment & Plan Note (Signed)
No anginal episodes. Recheck lipids, continue current regimen and lifestyle modifications

## 2019-03-18 NOTE — Assessment & Plan Note (Signed)
Recheck lipids, adjust as needed. Continue current regimen 

## 2019-03-19 LAB — LIPID PANEL W/O CHOL/HDL RATIO
Cholesterol, Total: 161 mg/dL (ref 100–199)
HDL: 68 mg/dL (ref >39)
LDL Chol Calc (NIH): 77 mg/dL (ref 0–99)
Triglycerides: 83 mg/dL (ref 0–149)
VLDL Cholesterol Cal: 16 mg/dL (ref 5–40)

## 2019-03-19 LAB — COMPREHENSIVE METABOLIC PANEL
ALT: 13 IU/L (ref 0–32)
AST: 22 IU/L (ref 0–40)
Albumin/Globulin Ratio: 1.5 (ref 1.2–2.2)
Albumin: 3.8 g/dL (ref 3.8–4.8)
Alkaline Phosphatase: 42 IU/L (ref 39–117)
BUN/Creatinine Ratio: 24 (ref 12–28)
BUN: 29 mg/dL — ABNORMAL HIGH (ref 8–27)
Bilirubin Total: 0.4 mg/dL (ref 0.0–1.2)
CO2: 25 mmol/L (ref 20–29)
Calcium: 9 mg/dL (ref 8.7–10.3)
Chloride: 104 mmol/L (ref 96–106)
Creatinine, Ser: 1.23 mg/dL — ABNORMAL HIGH (ref 0.57–1.00)
GFR calc Af Amer: 54 mL/min/{1.73_m2} — ABNORMAL LOW (ref >59)
GFR calc non Af Amer: 47 mL/min/{1.73_m2} — ABNORMAL LOW (ref >59)
Globulin, Total: 2.5 g/dL (ref 1.5–4.5)
Glucose: 85 mg/dL (ref 65–99)
Potassium: 3.7 mmol/L (ref 3.5–5.2)
Sodium: 146 mmol/L — ABNORMAL HIGH (ref 134–144)
Total Protein: 6.3 g/dL (ref 6.0–8.5)

## 2019-04-05 ENCOUNTER — Other Ambulatory Visit: Payer: Self-pay | Admitting: Family Medicine

## 2019-04-05 NOTE — Telephone Encounter (Signed)
Pt called stating that the zofran she has been prescribed for her nausea is not working. Pt is requesting to have a different medication sent in for her. Please advise.      [facility] [address] [place], [place] [address]  Phone: [phone] Fax: [phone]  Not a 24 hour pharmacy; exact hours not known.

## 2019-04-05 NOTE — Telephone Encounter (Signed)
Routing to provider  

## 2019-04-06 NOTE — Telephone Encounter (Signed)
Spoke with pt. She stated she does not have any Zofran at this time, she will need to have some sent to the pharmacy.

## 2019-04-06 NOTE — Telephone Encounter (Signed)
Can try taking 2 at a time and see if that helps. If not, may need further evaluation on why her nausea is so severe

## 2019-04-07 MED ORDER — ONDANSETRON 8 MG PO TBDP: 8.0000 mg | ORAL_TABLET | Freq: Three times a day (TID) | ORAL | 0 refills | Status: DC | PRN

## 2019-04-26 NOTE — Patient Instructions (Addendum)
We will add lamotrigine and try to wean divalproex   Start Lamictal 25 mg  Week one: Take lamotrigine 1 tablet twice a day ( 1 tablet the morning and 1 tablet at night) for 1 week then increase to: Week two: Take lamotrigine 2 tablets twice a day ( 2 tablet the morning and 2 tablet at night) for 1 week then increase to: Week three: take lamotrigine 3 tablets twice a day ( 3 tablets in the morning and 3 tablets at night) for 1 week AND decrease divalproex to 500mg  twice daily then on Week four: take lamotrigine 4 tablets twice a day (4 tablets in the morning and 4 tablets at night) continue at this dosage and decrease divalproex to 250mg  twice daily then: Week five: continue lamotrigine 100mg  twice daily and decrease divalproex to 250mg  once daily then: Week six: continue lamotrigine 100mg  twice daily and stop divalproex.    Follow up with me in 8 weeks   Lamotrigine tablets What is this medicine? LAMOTRIGINE (la MOE Hendricks Limes) is used to control seizures in adults and children with epilepsy and Lennox-Gastaut syndrome. It is also used in adults to treat bipolar disorder. This medicine may be used for other purposes; ask your health care provider or pharmacist if you have questions. COMMON BRAND NAME(S): Lamictal, Subvenite What should I tell my health care provider before I take this medicine? They need to know if you have any of these conditions:  aseptic meningitis during prior use of lamotrigine  depression  folate deficiency  kidney disease  liver disease  suicidal thoughts, plans, or attempt; a previous suicide attempt by you or a family member  an unusual or allergic reaction to lamotrigine or other seizure medications, other medicines, foods, dyes, or preservatives  pregnant or trying to get pregnant  breast-feeding How should I use this medicine? Take this medicine by mouth with a glass of water. Follow the directions on the prescription label. Do not chew these  tablets. If this medicine upsets your stomach, take it with food or milk. Take your doses at regular intervals. Do not take your medicine more often than directed. A special MedGuide will be given to you by the pharmacist with each new prescription and refill. Be sure to read this information carefully each time. Talk to your pediatrician regarding the use of this medicine in children. While this drug may be prescribed for children as young as 2 years for selected conditions, precautions do apply. Overdosage: If you think you have taken too much of this medicine contact a poison control center or emergency room at once. NOTE: This medicine is only for you. Do not share this medicine with others. What if I miss a dose? If you miss a dose, take it as soon as you can. If it is almost time for your next dose, take only that dose. Do not take double or extra doses. What may interact with this medicine?  atazanavir  carbamazepine  female hormones, including contraceptive or birth control pills  lopinavir  methotrexate  phenobarbital  phenytoin  primidone  pyrimethamine  rifampin  ritonavir  trimethoprim  valproic acid This list may not describe all possible interactions. Give your health care provider a list of all the medicines, herbs, non-prescription drugs, or dietary supplements you use. Also tell them if you smoke, drink alcohol, or use illegal drugs. Some items may interact with your medicine. What should I watch for while using this medicine? Visit your doctor or health care provider  for regular checks on your progress. If you take this medicine for seizures, wear a Medic Alert bracelet or necklace. Carry an identification card with information about your condition, medicines, and doctor or health care provider. It is important to take this medicine exactly as directed. When first starting treatment, your dose will need to be adjusted slowly. It may take weeks or months before  your dose is stable. You should contact your doctor or health care provider if your seizures get worse or if you have any new types of seizures. Do not stop taking this medicine unless instructed by your doctor or health care provider. Stopping your medicine suddenly can increase your seizures or their severity. This medicine may cause serious skin reactions. They can happen weeks to months after starting the medicine. Contact your health care provider right away if you notice fevers or flu-like symptoms with a rash. The rash may be red or purple and then turn into blisters or peeling of the skin. Or, you might notice a red rash with swelling of the face, lips or lymph nodes in your neck or under your arms. You may get drowsy, dizzy, or have blurred vision. Do not drive, use machinery, or do anything that needs mental alertness until you know how this medicine affects you. To reduce dizzy or fainting spells, do not sit or stand up quickly, especially if you are an older patient. Alcohol can increase drowsiness and dizziness. Avoid alcoholic drinks. If you are taking this medicine for bipolar disorder, it is important to report any changes in your mood to your doctor or health care provider. If your condition gets worse, you get mentally depressed, feel very hyperactive or manic, have difficulty sleeping, or have thoughts of hurting yourself or committing suicide, you need to get help from your health care provider right away. If you are a caregiver for someone taking this medicine for bipolar disorder, you should also report these behavioral changes right away. The use of this medicine may increase the chance of suicidal thoughts or actions. Pay special attention to how you are responding while on this medicine. Your mouth may get dry. Chewing sugarless gum or sucking hard candy, and drinking plenty of water may help. Contact your doctor if the problem does not go away or is severe. Women who become pregnant  while using this medicine may enroll in the [place] Pregnancy Registry by calling [phone]. This registry collects information about the safety of antiepileptic drug use during pregnancy. This medicine may cause a decrease in folic acid. You should make sure that you get enough folic acid while you are taking this medicine. Discuss the foods you eat and the vitamins you take with your health care provider. What side effects may I notice from receiving this medicine? Side effects that you should report to your doctor or health care professional as soon as possible:  allergic reactions like skin rash, itching or hives, swelling of the face, lips, or tongue  changes in vision  depressed mood  elevated mood, decreased need for sleep, racing thoughts, impulsive behavior  loss of balance or coordination  mouth sores  rash, fever, and swollen lymph nodes  redness, blistering, peeling or loosening of the skin, including inside the mouth  right upper belly pain  seizures  severe muscle pain  signs and symptoms of aseptic meningitis such as stiff neck and sensitivity to light, headache, drowsiness, fever, nausea, vomiting, rash  signs of infection - fever or chills,  cough, sore throat, pain or difficulty passing urine  suicidal thoughts or other mood changes  swollen lymph nodes  trouble walking  unusual bruising or bleeding  unusually weak or tired  yellowing of the eyes or skin Side effects that usually do not require medical attention (report to your doctor or health care professional if they continue or are bothersome):  diarrhea  dizziness  dry mouth  stuffy nose  tiredness  tremors  trouble sleeping This list may not describe all possible side effects. Call your doctor for medical advice about side effects. You may report side effects to FDA at 1-800-FDA-1088. Where should I keep my medicine? Keep out of reach of children. Store  at room temperature between 15 and 30 degrees C (59 and 86 degrees F). Throw away any unused medicine after the expiration date. NOTE: This sheet is a summary. It may not cover all possible information. If you have questions about this medicine, talk to your doctor, pharmacist, or health care provider.  2020 Elsevier/Gold Standard (2018-04-23 15:03:40)   Seizure, Adult A seizure is a sudden burst of abnormal electrical activity in the brain. Seizures usually last from 30 seconds to 2 minutes. They can cause many different symptoms. Usually, seizures are not harmful unless they last a long time. What are the causes? Common causes of this condition include:  Fever or infection.  Conditions that affect the brain, such as: ? A brain abnormality that you were born with. ? A brain or head injury. ? Bleeding in the brain. ? A tumor. ? Stroke. ? Brain disorders such as autism or cerebral palsy.  Low blood sugar.  Conditions that are passed from parent to child (are inherited).  Problems with substances, such as: ? Having a reaction to a drug or a medicine. ? Suddenly stopping the use of a substance (withdrawal). In some cases, the cause may not be known. A person who has repeated seizures over time without a clear cause has a condition called epilepsy. What increases the risk? You are more likely to get this condition if you have:  A family history of epilepsy.  Had a seizure in the past.  A brain disorder.  A history of head injury, lack of oxygen at birth, or strokes. What are the signs or symptoms? There are many types of seizures. The symptoms vary depending on the type of seizure you have. Examples of symptoms during a seizure include:  Shaking (convulsions).  Stiffness in the body.  Passing out (losing consciousness).  Head nodding.  Staring.  Not responding to sound or touch.  Loss of bladder control and bowel control. Some people have symptoms right before and  right after a seizure happens. Symptoms before a seizure may include:  Fear.  Worry (anxiety).  Feeling like you may vomit (nauseous).  Feeling like the room is spinning (vertigo).  Feeling like you saw or heard something before (dj vu).  Odd tastes or smells.  Changes in how you see. You may see flashing lights or spots. Symptoms after a seizure happens can include:  Confusion.  Sleepiness.  Headache.  Weakness on one side of the body. How is this treated? Most seizures will stop on their own in under 5 minutes. In these cases, no treatment is needed. Seizures that last longer than 5 minutes will usually need treatment. Treatment can include:  Medicines given through an IV tube.  Avoiding things that are known to cause your seizures. These can include medicines that you take  for another condition.  Medicines to treat epilepsy.  Surgery to stop the seizures. This may be needed if medicines do not help. Follow these instructions at home: Medicines  Take over-the-counter and prescription medicines only as told by your doctor.  Do not eat or drink anything that may keep your medicine from working, such as alcohol. Activity  Do not do any activities that would be dangerous if you had another seizure, like driving or swimming. Wait until your doctor says it is safe for you to do them.  If you live in the [place] ask your local DMV (department of motor vehicles) when you can drive.  Get plenty of rest. Teaching others Teach friends and family what to do when you have a seizure. They should:  Lay you on the ground.  Protect your head and body.  Loosen any tight clothing around your neck.  Turn you on your side.  Not hold you down.  Not put anything into your mouth.  Know whether or not you need emergency care.  Stay with you until you are better.  General instructions  Contact your doctor each time you have a seizure.  Avoid anything that gives you  seizures.  Keep a seizure diary. Write down: ? What you think caused each seizure. ? What you remember about each seizure.  Keep all follow-up visits as told by your doctor. This is important. Contact a doctor if:  You have another seizure.  You have seizures more often.  There is any change in what happens during your seizures.  You keep having seizures with treatment.  You have symptoms of being sick or having an infection. Get help right away if:  You have a seizure that: ? Lasts longer than 5 minutes. ? Is different than seizures you had before. ? Makes it harder to breathe. ? Happens after you hurt your head.  You have any of these symptoms after a seizure: ? Not being able to speak. ? Not being able to use a part of your body. ? Confusion. ? A bad headache.  You have two or more seizures in a row.  You do not wake up right after a seizure.  You get hurt during a seizure. These symptoms may be an emergency. Do not wait to see if the symptoms will go away. Get medical help right away. Call your local emergency services (911 in the [place] Do not drive yourself to the hospital. Summary  Seizures usually last from 30 seconds to 2 minutes. Usually, they are not harmful unless they last a long time.  Do not eat or drink anything that may keep your medicine from working, such as alcohol.  Teach friends and family what to do when you have a seizure.  Contact your doctor each time you have a seizure. This information is not intended to replace advice given to you by your health care provider. Make sure you discuss any questions you have with your health care provider. Document Revised: 04/09/2018 Document Reviewed: 04/09/2018 Elsevier Patient Education  [place].

## 2019-04-26 NOTE — Progress Notes (Signed)
PATIENT: Kayla Shah DOB: [birth year]  REASON FOR VISIT: follow up HISTORY FROM: patient  Chief Complaint  Patient presents with  . Follow-up    RM1. with husband. states tremors are worsening.      HISTORY OF PRESENT ILLNESS: Today 04/27/19 Kayla Shah [place] is a 64 y.o. female here today for follow up for seizures. She continues divalproex 750mg  twice daily. She reports that tremors have significantly worsened. She is unable to hold a coffee cup. She has had significant weight loss as well. She does endorse depression and anxiety but has been followed closely by PCP and decline medications for this. She denies difficulty swallowing. No changes in gait. She denies changes in movements with exception of tremors. No family history of PD. Symptoms have progressively worsened since starting divalproex in May 2020. No seizure activity. She does not drive. PCP started Megace last month. Headaches are well managed at this time.   HISTORY: (copied from my note on 09/06/2018)  Kayla Shah is a 64 y.o. female here today for follow up for seizure. She was admitted to [facility] on 06/12/2018 following two seizures at home. Depakote was subtherapeutic on admission. She denies missed doses. Dose was increased to 750mg  twice daily. She has noticed an increase in tremor since increasing dose but is tolerating well otherwise. She denies seizure activity. She is feeling well today and without complaints.   Upon review of ER notes, she had elevated troponin and abnormal EKG (inferior ST depression). Cardiology consulted and advised additional testing. She has extensive family history of cardiovascular disease. She is treated for HTN and told that lipids are elevated (not currently treated). She is smoking with no desire to quit.   HISTORY: (copied from Dr Gladstone Lighter note on 11/18/2017)  64 year old female here for evaluation of seizure and migraine.  Patient first developed  seizures in 92s. She was on Keppra at that time. At some point she was taken off of Keppra. 2014 she was hospitalized for seizure. She may have been on and off of Keppra following 2014 but she is not sure. Patient reports grand mal seizures and postictal confusion symptoms but is not quite sure about the frequency. She had another seizure possibly in September 2019. She is not on antiseizure medication at this time.  Patient has history of migraine headaches at age 25 years old with pounding throbbing headaches, nausea, photophobia, typically unilateral right greater than left-sided headaches. She averages 4-5 headaches per month. She is not sure what type of medication she has been on the past.  Patient also has history of depression, anxiety, stress, weight loss, chronic pain.   REVIEW OF SYSTEMS: Out of a complete 14 system review of symptoms, the patient complains only of the following symptoms, tremor, weight loss, and all other reviewed systems are negative.  ALLERGIES: Allergies  Allergen Reactions  . Paroxetine Hcl Other (See Comments)    Pt. Becomes very aggressive with mood swings.  . Codeine Nausea And Vomiting  . Fluoxetine Anxiety  . Paroxetine Anxiety    HOME MEDICATIONS: Outpatient Medications Prior to Visit  Medication Sig Dispense Refill  . Calcium-Magnesium-Vitamin D (CALCIUM 500 PO) Take 1 tablet by mouth daily.     . cholecalciferol (VITAMIN D) 1000 units tablet Take 1,000 Units by mouth daily.    . divalproex (DEPAKOTE) 250 MG DR tablet Take 3 tablets (750 mg total) by mouth 2 (two) times daily. 540 tablet 4  . gabapentin (NEURONTIN) 300 MG capsule TAKE  ONE CAPSULE BY MOUTH TWICE A DAY 60 capsule 3  . ibuprofen (ADVIL) 200 MG tablet Take 200 mg by mouth every 6 (six) hours as needed.    . ondansetron (ZOFRAN ODT) 8 MG disintegrating tablet Take 1 tablet (8 mg total) by mouth every 8 (eight) hours as needed for nausea or vomiting. 30 tablet 0  .  pantoprazole (PROTONIX) 40 MG tablet TAKE 1 TABLET BY MOUTH TWICE A DAY 60 tablet 2  . simvastatin (ZOCOR) 20 MG tablet TAKE 1 TABLET BY MOUTH DAILY 90 tablet 1  . verapamil (CALAN-SR) 240 MG CR tablet TAKE 1 TABLET BY MOUTH DAILY 90 tablet 1  . megestrol (MEGACE) 20 MG tablet Take 1 tablet (20 mg total) by mouth daily. 30 tablet 0   No facility-administered medications prior to visit.    PAST MEDICAL HISTORY: Past Medical History:  Diagnosis Date  . Allergy   . Anxiety    agoraphobia   . Arthritis   . Chicken pox   . COPD (chronic obstructive pulmonary disease) ([place])   . Depression   . GERD (gastroesophageal reflux disease)   . Headache   . History of blood transfusion    1984  . Hyperlipidemia   . Hypertension   . Migraine   . Seizure ([place])    was on keppra    PAST SURGICAL HISTORY: Past Surgical History:  Procedure Laterality Date  . ABDOMINAL HYSTERECTOMY  1984   endometriosis  . APPENDECTOMY    . COLONOSCOPY WITH PROPOFOL N/A 03/26/2017   Procedure: COLONOSCOPY WITH PROPOFOL;  Surgeon: Jonathon Bellows, MD;  Location: [facility] ENDOSCOPY;  Service: Gastroenterology;  Laterality: N/A;  . WISDOM TOOTH EXTRACTION      FAMILY HISTORY: Family History  Problem Relation Age of Onset  . Stroke Mother   . Depression Mother   . Arthritis Mother   . Heart disease Father   . Alcohol abuse Father   . Diabetes Father   . Osteoporosis Sister   . Kidney disease Sister   . Arthritis Sister   . Arthritis Maternal Grandmother   . Heart disease Maternal Grandmother   . Hyperlipidemia Maternal Grandmother   . Diabetes Paternal Grandmother   . Breast cancer Neg Hx     SOCIAL HISTORY: Social History   Socioeconomic History  . Marital status: Divorced    Spouse name: Legrand Como  . Number of children: 0  . Years of education: 43  . Highest education level: Not on file  Occupational History    Comment: disabled  Tobacco Use  . Smoking status: Current Every Day Smoker     Packs/day: 0.25    Years: 45.00    Pack years: 11.25    Types: Cigarettes  . Smokeless tobacco: Never Used  Substance and Sexual Activity  . Alcohol use: No  . Drug use: Yes    Types: Marijuana  . Sexual activity: Never  Other Topics Concern  . Not on file  Social History Narrative   Disabled    Married no kids    No guns, wears seat belt, feels safe in relationship    12th grade ed.    Social Determinants of Health   Financial Resource Strain:   . Difficulty of Paying Living Expenses:   Food Insecurity:   . Worried About Charity fundraiser in the Last Year:   . Arboriculturist in the Last Year:   Transportation Needs:   . Film/video editor (Medical):   Marland Kitchen Lack  of Transportation (Non-Medical):   Physical Activity:   . Days of Exercise per Week:   . Minutes of Exercise per Session:   Stress:   . Feeling of Stress :   Social Connections:   . Frequency of Communication with Friends and Family:   . Frequency of Social Gatherings with Friends and Family:   . Attends Religious Services:   . Active Member of Clubs or Organizations:   . Attends Archivist Meetings:   Marland Kitchen Marital Status:   Intimate Partner Violence:   . Fear of Current or Ex-Partner:   . Emotionally Abused:   Marland Kitchen Physically Abused:   . Sexually Abused:       PHYSICAL EXAM  Vitals:   04/27/19 1359  BP: 110/74  Pulse: 67  Temp: (!) 97.5 F (36.4 C)  Weight: 109 lb 12.8 oz (49.8 kg)  Height: 5\' 5"  (1.651 m)   Body mass index is 18.27 kg/m.  Generalized: Well developed, in no acute distress  Cardiology: normal rate and rhythm, no murmur noted Respiratory: clear to auscultation bilaterally  Neurological examination  Mentation: Alert oriented to time, place, history taking. Follows all commands speech and language fluent Cranial nerve II-XII: Pupils were equal round reactive to light. Extraocular movements were full, visual field were full on confrontational test. Facial sensation and  strength were normal. Head turning and shoulder shrug  were normal and symmetric. Motor: The motor testing reveals 5 over 5 strength of all 4 extremities. Good symmetric motor tone is noted throughout. No bradykinesia noted, mild action tremor with exam Gait and station: Gait is normal.   DIAGNOSTIC DATA (LABS, IMAGING, TESTING) - I reviewed patient records, labs, notes, testing and imaging myself where available.  No flowsheet data found.   Lab Results  Component Value Date   WBC 6.4 09/08/2018   HGB 12.7 09/08/2018   HCT 37.9 09/08/2018   MCV 103 (H) 09/08/2018   PLT 123 (L) 09/08/2018      Component Value Date/Time   NA 146 (H) 03/18/2019 1132   NA 139 08/28/2013 1805   K 3.7 03/18/2019 1132   K 3.5 08/28/2013 1805   CL 104 03/18/2019 1132   CL 101 08/28/2013 1805   CO2 25 03/18/2019 1132   CO2 34 (H) 08/28/2013 1805   GLUCOSE 85 03/18/2019 1132   GLUCOSE 102 (H) 06/13/2018 0258   GLUCOSE 91 08/28/2013 1805   BUN 29 (H) 03/18/2019 1132   BUN 13 08/28/2013 1805   CREATININE 1.23 (H) 03/18/2019 1132   CREATININE 1.27 08/28/2013 1805   CALCIUM 9.0 03/18/2019 1132   CALCIUM 8.8 08/28/2013 1805   PROT 6.3 03/18/2019 1132   PROT 7.8 08/28/2013 1805   ALBUMIN 3.8 03/18/2019 1132   ALBUMIN 4.2 08/28/2013 1805   AST 22 03/18/2019 1132   AST 23 08/28/2013 1805   ALT 13 03/18/2019 1132   ALT 16 08/28/2013 1805   ALKPHOS 42 03/18/2019 1132   ALKPHOS 64 08/28/2013 1805   BILITOT 0.4 03/18/2019 1132   BILITOT 0.3 08/28/2013 1805   GFRNONAA 47 (L) 03/18/2019 1132   GFRNONAA 47 (L) 08/28/2013 1805   GFRAA 54 (L) 03/18/2019 1132   GFRAA 54 (L) 08/28/2013 1805   Lab Results  Component Value Date   CHOL 161 03/18/2019   HDL 68 03/18/2019   LDLCALC 77 03/18/2019   TRIG 83 03/18/2019   CHOLHDL 2.1 06/12/2018   Lab Results  Component Value Date   HGBA1C 5.6 06/12/2018   No  results found for: VITAMINB12 Lab Results  Component Value Date   TSH 1.81 08/05/2017        ASSESSMENT AND PLAN 64 y.o. year old female  has a past medical history of Allergy, Anxiety, Arthritis, Chicken pox, COPD (chronic obstructive pulmonary disease) ([place]), Depression, GERD (gastroesophageal reflux disease), Headache, History of blood transfusion, Hyperlipidemia, Hypertension, Migraine, and Seizure ([place]). here with     ICD-10-CM   1. Seizures ([place])  R56.9   2. Migraine without aura and without status migrainosus, not intractable  G43.009   3. Major depressive disorder, remission status unspecified, unspecified whether recurrent  F32.9   4. Weight loss  R63.4     Lylli reports that tremor has significantly worsened with continued use of divalproex.  She also states that depression has been more pronounced over the past few months.  She has a history of depression but not currently treated.  She had weight fluctuations over the past year.  She has lost approximately 6 pounds since I saw her in August.  She is concerned that her murmur and worsening depression are a direct result of divalproex use.  She wishes to stop this medication and try something different.  She has taken levetiracetam in the past but does not feel that it was well-tolerated.  We will start lamotrigine as directed.  We will titrate lamotrigine and wean divalproex as directed.  She will monitor symptoms very closely. She denies SI/HI and will call immediately for any new or worsening concerns.  She will consider reaching out to primary care to discuss anxiety management.  Adequate hydration, well-balanced diet and regular exercise advised.  She will follow-up with me in 8 weeks, sooner if needed.  She verbalizes understanding and agreement with this plan.   Start Lamictal 25 mg  Week one: Take lamotrigine 1 tablet twice a day ( 1 tablet the morning and 1 tablet at night) for 1 week then increase to: Week two: Take lamotrigine 2 tablets twice a day ( 2 tablet the morning and 2 tablet at night) for 1 week then  increase to: Week three: take lamotrigine 3 tablets twice a day ( 3 tablets in the morning and 3 tablets at night) for 1 week AND decrease divalproex to 500mg  twice daily then on Week four: take lamotrigine 4 tablets twice a day (4 tablets in the morning and 4 tablets at night) continue at this dosage and decrease divalproex to 250mg  twice daily then: Week five: continue lamotrigine 100mg  twice daily and decrease divalproex to 250mg  once daily then: Week six: continue lamotrigine 100mg  twice daily and stop divalproex.    No orders of the defined types were placed in this encounter.    Meds ordered this encounter  Medications  . lamoTRIgine (LAMICTAL) 25 MG tablet    Sig: Take 4 tablets (100 mg total) by mouth 2 (two) times daily.    Dispense:  240 tablet    Refill:  2    Order Specific Question:   Supervising Provider    Answer:   Melvenia Beam [id number]      I spent 30 minutes with the patient. 50% of this time was spent counseling and educating patient on plan of care and medications.    Debbora Presto, FNP-C 04/27/2019, 3:07 PM [facility] [address] [place], [place] [address] [phone]

## 2019-04-27 ENCOUNTER — Encounter: Payer: Self-pay | Admitting: Family Medicine

## 2019-04-27 ENCOUNTER — Ambulatory Visit: Payer: Medicare Other | Admitting: Family Medicine

## 2019-04-27 ENCOUNTER — Other Ambulatory Visit: Payer: Self-pay

## 2019-04-27 VITALS — BP 110/74 | HR 67 | Temp 97.5°F | Ht 65.0 in | Wt 109.8 lb

## 2019-04-27 DIAGNOSIS — R569 Unspecified convulsions: Secondary | ICD-10-CM

## 2019-04-27 DIAGNOSIS — G43009 Migraine without aura, not intractable, without status migrainosus: Secondary | ICD-10-CM | POA: Diagnosis not present

## 2019-04-27 DIAGNOSIS — R634 Abnormal weight loss: Secondary | ICD-10-CM | POA: Diagnosis not present

## 2019-04-27 DIAGNOSIS — F329 Major depressive disorder, single episode, unspecified: Secondary | ICD-10-CM | POA: Diagnosis not present

## 2019-04-27 MED ORDER — LAMOTRIGINE 25 MG PO TABS: 100.0000 mg | ORAL_TABLET | Freq: Two times a day (BID) | ORAL | 2 refills | Status: DC

## 2019-04-28 ENCOUNTER — Other Ambulatory Visit: Payer: Self-pay | Admitting: Family Medicine

## 2019-04-28 NOTE — Telephone Encounter (Signed)
Routing to provider  

## 2019-04-28 NOTE — Telephone Encounter (Signed)
Requested medication (s) are due for refill today: yes  Requested medication (s) are on the active medication list: yes  Last refill:  04/07/19#30  Future visit scheduled: yes  Notes to clinic:  Refill not delegated per protocol    Requested Prescriptions  Pending Prescriptions Disp Refills   ondansetron (ZOFRAN-ODT) 8 MG disintegrating tablet [Pharmacy Med Name: ONDANSETRON 8 MG ODT] 30 tablet 0    Sig: TAKE 1 TABLET BY MOUTH EVERY 8 HOURS AS NEEDED FOR NAUSEA OR VOMITING      Not Delegated - Gastroenterology: Antiemetics Failed - 04/28/2019 10:36 AM      Failed - This refill cannot be delegated      Passed - Valid encounter within last 6 months    Recent Outpatient Visits           1 month ago Essential (primary) hypertension   [facility] [place], [place]   7 months ago Essential (primary) hypertension   [facility], [place], [place]   9 months ago Constipation, unspecified constipation type   [facility], [place], [place]   10 months ago Migraine without aura and without status migrainosus, not intractable   [facility], [facility] [place], [place]   1 year ago Need for hepatitis C screening test   [facility] [place], [place]       Future Appointments             In 4 months Orene Desanctis, [place], [place], [facility]

## 2019-05-01 ENCOUNTER — Inpatient Hospital Stay
Admission: EM | Admit: 2019-05-01 | Discharge: 2019-05-03 | DRG: 392 | Disposition: A | Discharge status: Home Health | Payer: Medicare Other | Attending: Internal Medicine | Admitting: Internal Medicine

## 2019-05-01 ENCOUNTER — Other Ambulatory Visit: Payer: Self-pay

## 2019-05-01 ENCOUNTER — Emergency Department: Payer: Medicare Other

## 2019-05-01 DIAGNOSIS — Z83438 Family history of other disorder of lipoprotein metabolism and other lipidemia: Secondary | ICD-10-CM

## 2019-05-01 DIAGNOSIS — Z79899 Other long term (current) drug therapy: Secondary | ICD-10-CM

## 2019-05-01 DIAGNOSIS — I10 Essential (primary) hypertension: Secondary | ICD-10-CM | POA: Diagnosis present

## 2019-05-01 DIAGNOSIS — Z87891 Personal history of nicotine dependence: Secondary | ICD-10-CM

## 2019-05-01 DIAGNOSIS — K219 Gastro-esophageal reflux disease without esophagitis: Secondary | ICD-10-CM | POA: Diagnosis present

## 2019-05-01 DIAGNOSIS — M810 Age-related osteoporosis without current pathological fracture: Secondary | ICD-10-CM | POA: Diagnosis present

## 2019-05-01 DIAGNOSIS — G40909 Epilepsy, unspecified, not intractable, without status epilepticus: Secondary | ICD-10-CM | POA: Diagnosis present

## 2019-05-01 DIAGNOSIS — Z823 Family history of stroke: Secondary | ICD-10-CM

## 2019-05-01 DIAGNOSIS — R778 Other specified abnormalities of plasma proteins: Secondary | ICD-10-CM | POA: Diagnosis not present

## 2019-05-01 DIAGNOSIS — Z885 Allergy status to narcotic agent status: Secondary | ICD-10-CM

## 2019-05-01 DIAGNOSIS — R7989 Other specified abnormal findings of blood chemistry: Secondary | ICD-10-CM | POA: Diagnosis present

## 2019-05-01 DIAGNOSIS — D7589 Other specified diseases of blood and blood-forming organs: Secondary | ICD-10-CM | POA: Diagnosis present

## 2019-05-01 DIAGNOSIS — R251 Tremor, unspecified: Secondary | ICD-10-CM | POA: Diagnosis present

## 2019-05-01 DIAGNOSIS — J449 Chronic obstructive pulmonary disease, unspecified: Secondary | ICD-10-CM | POA: Diagnosis present

## 2019-05-01 DIAGNOSIS — Z833 Family history of diabetes mellitus: Secondary | ICD-10-CM

## 2019-05-01 DIAGNOSIS — R112 Nausea with vomiting, unspecified: Principal | ICD-10-CM | POA: Diagnosis present

## 2019-05-01 DIAGNOSIS — I2510 Atherosclerotic heart disease of native coronary artery without angina pectoris: Secondary | ICD-10-CM | POA: Diagnosis present

## 2019-05-01 DIAGNOSIS — R569 Unspecified convulsions: Secondary | ICD-10-CM | POA: Diagnosis not present

## 2019-05-01 DIAGNOSIS — Z20822 Contact with and (suspected) exposure to covid-19: Secondary | ICD-10-CM | POA: Diagnosis present

## 2019-05-01 DIAGNOSIS — Z8261 Family history of arthritis: Secondary | ICD-10-CM

## 2019-05-01 DIAGNOSIS — F1290 Cannabis use, unspecified, uncomplicated: Secondary | ICD-10-CM | POA: Diagnosis present

## 2019-05-01 DIAGNOSIS — Z888 Allergy status to other drugs, medicaments and biological substances status: Secondary | ICD-10-CM

## 2019-05-01 DIAGNOSIS — R197 Diarrhea, unspecified: Secondary | ICD-10-CM | POA: Diagnosis present

## 2019-05-01 DIAGNOSIS — G4700 Insomnia, unspecified: Secondary | ICD-10-CM | POA: Diagnosis present

## 2019-05-01 DIAGNOSIS — Z8249 Family history of ischemic heart disease and other diseases of the circulatory system: Secondary | ICD-10-CM

## 2019-05-01 DIAGNOSIS — Z8262 Family history of osteoporosis: Secondary | ICD-10-CM

## 2019-05-01 DIAGNOSIS — E876 Hypokalemia: Secondary | ICD-10-CM | POA: Diagnosis present

## 2019-05-01 DIAGNOSIS — E785 Hyperlipidemia, unspecified: Secondary | ICD-10-CM | POA: Diagnosis present

## 2019-05-01 DIAGNOSIS — Z841 Family history of disorders of kidney and ureter: Secondary | ICD-10-CM

## 2019-05-01 DIAGNOSIS — Z90710 Acquired absence of both cervix and uterus: Secondary | ICD-10-CM

## 2019-05-01 LAB — CBC WITH DIFFERENTIAL/PLATELET
Abs Immature Granulocytes: 0.03 10*3/uL (ref 0.00–0.07)
Basophils Absolute: 0 10*3/uL (ref 0.0–0.1)
Basophils Relative: 0 %
Eosinophils Absolute: 0.2 10*3/uL (ref 0.0–0.5)
Eosinophils Relative: 2 %
HCT: 37.9 % (ref 36.0–46.0)
Hemoglobin: 13.1 g/dL (ref 12.0–15.0)
Immature Granulocytes: 0 %
Lymphocytes Relative: 29 %
Lymphs Abs: 2.3 10*3/uL (ref 0.7–4.0)
MCH: 36.1 pg — ABNORMAL HIGH (ref 26.0–34.0)
MCHC: 34.6 g/dL (ref 30.0–36.0)
MCV: 104.4 fL — ABNORMAL HIGH (ref 80.0–100.0)
Monocytes Absolute: 1.2 10*3/uL — ABNORMAL HIGH (ref 0.1–1.0)
Monocytes Relative: 16 %
Neutro Abs: 4.1 10*3/uL (ref 1.7–7.7)
Neutrophils Relative %: 53 %
Platelets: 134 10*3/uL — ABNORMAL LOW (ref 150–400)
RBC: 3.63 MIL/uL — ABNORMAL LOW (ref 3.87–5.11)
RDW: 14.6 % (ref 11.5–15.5)
WBC: 7.7 10*3/uL (ref 4.0–10.5)
nRBC: 0 % (ref 0.0–0.2)

## 2019-05-01 LAB — URINE DRUG SCREEN, QUALITATIVE (ARMC ONLY)
Amphetamines, Ur Screen: NOT DETECTED
Barbiturates, Ur Screen: NOT DETECTED
Benzodiazepine, Ur Scrn: NOT DETECTED
Cannabinoid 50 Ng, Ur ~~LOC~~: POSITIVE — AB
Cocaine Metabolite,Ur ~~LOC~~: NOT DETECTED
MDMA (Ecstasy)Ur Screen: NOT DETECTED
Methadone Scn, Ur: NOT DETECTED
Opiate, Ur Screen: NOT DETECTED
Phencyclidine (PCP) Ur S: NOT DETECTED
Tricyclic, Ur Screen: NOT DETECTED

## 2019-05-01 LAB — COMPREHENSIVE METABOLIC PANEL
ALT: 41 U/L (ref 0–44)
AST: 63 U/L — ABNORMAL HIGH (ref 15–41)
Albumin: 3.8 g/dL (ref 3.5–5.0)
Alkaline Phosphatase: 40 U/L (ref 38–126)
Anion gap: 13 (ref 5–15)
BUN: 21 mg/dL (ref 8–23)
CO2: 26 mmol/L (ref 22–32)
Calcium: 9.4 mg/dL (ref 8.9–10.3)
Chloride: 99 mmol/L (ref 98–111)
Creatinine, Ser: 1.03 mg/dL — ABNORMAL HIGH (ref 0.44–1.00)
GFR calc Af Amer: >60 mL/min (ref >60)
GFR calc non Af Amer: 58 mL/min — ABNORMAL LOW (ref >60)
Glucose, Bld: 112 mg/dL — ABNORMAL HIGH (ref 70–99)
Potassium: 3.2 mmol/L — ABNORMAL LOW (ref 3.5–5.1)
Sodium: 138 mmol/L (ref 135–145)
Total Bilirubin: 1.2 mg/dL (ref 0.3–1.2)
Total Protein: 7 g/dL (ref 6.5–8.1)

## 2019-05-01 LAB — LIPASE, BLOOD: Lipase: 25 U/L (ref 11–51)

## 2019-05-01 LAB — VALPROIC ACID LEVEL: Valproic Acid Lvl: 38 ug/mL — ABNORMAL LOW (ref 50.0–100.0)

## 2019-05-01 LAB — URINALYSIS, ROUTINE W REFLEX MICROSCOPIC
Bacteria, UA: NONE SEEN
Bilirubin Urine: NEGATIVE
Glucose, UA: NEGATIVE mg/dL
Ketones, ur: 5 mg/dL — AB
Leukocytes,Ua: NEGATIVE
Nitrite: NEGATIVE
Protein, ur: NEGATIVE mg/dL
Specific Gravity, Urine: 1.041 — ABNORMAL HIGH (ref 1.005–1.030)
Squamous Epithelial / HPF: NONE SEEN (ref 0–5)
pH: 7 (ref 5.0–8.0)

## 2019-05-01 LAB — ETHANOL: Alcohol, Ethyl (B): <10 mg/dL (ref <10)

## 2019-05-01 LAB — TROPONIN I (HIGH SENSITIVITY)
Troponin I (High Sensitivity): 159 ng/L (ref <18)
Troponin I (High Sensitivity): 143 ng/L (ref <18)
Troponin I (High Sensitivity): 85 ng/L — ABNORMAL HIGH (ref <18)
Troponin I (High Sensitivity): 96 ng/L — ABNORMAL HIGH (ref <18)

## 2019-05-01 LAB — MAGNESIUM: Magnesium: 1.8 mg/dL (ref 1.7–2.4)

## 2019-05-01 MED ORDER — ONDANSETRON HCL 4 MG/2ML IJ SOLN
4.0000 mg | Freq: Once | INTRAMUSCULAR | Status: AC
  Administered 2019-05-01: 4 mg via INTRAVENOUS
  Filled 2019-05-01: qty 2

## 2019-05-01 MED ORDER — IOHEXOL 300 MG/ML  SOLN
75.0000 mL | Freq: Once | INTRAMUSCULAR | Status: AC | PRN
  Administered 2019-05-01: 75 mL via INTRAVENOUS

## 2019-05-01 MED ORDER — POTASSIUM CHLORIDE 10 MEQ/100ML IV SOLN
10.0000 meq | Freq: Once | INTRAVENOUS | Status: AC
  Administered 2019-05-01: 10 meq via INTRAVENOUS
  Filled 2019-05-01: qty 100

## 2019-05-01 MED ORDER — KCL-LACTATED RINGERS 20 MEQ/L IV SOLN
INTRAVENOUS | Status: DC
  Filled 2019-05-01 (×5): qty 1000

## 2019-05-01 MED ORDER — POTASSIUM CHLORIDE 10 MEQ/100ML IV SOLN: 10.0000 meq | INTRAVENOUS | Status: AC

## 2019-05-01 MED ORDER — LAMOTRIGINE 25 MG PO TABS
25.0000 mg | ORAL_TABLET | Freq: Once | ORAL | Status: DC
  Filled 2019-05-01: qty 1

## 2019-05-01 MED ORDER — LEVETIRACETAM IN NACL 500 MG/100ML IV SOLN
500.0000 mg | Freq: Two times a day (BID) | INTRAVENOUS | Status: DC
  Administered 2019-05-01 – 2019-05-03 (×4): 500 mg via INTRAVENOUS
  Filled 2019-05-01 (×6): qty 100

## 2019-05-01 MED ORDER — ONDANSETRON HCL 4 MG PO TABS: 4.0000 mg | ORAL_TABLET | Freq: Four times a day (QID) | ORAL | Status: DC | PRN

## 2019-05-01 MED ORDER — ENOXAPARIN SODIUM 40 MG/0.4ML ~~LOC~~ SOLN
40.0000 mg | SUBCUTANEOUS | Status: DC
  Administered 2019-05-02 (×2): 40 mg via SUBCUTANEOUS
  Filled 2019-05-01 (×2): qty 0.4

## 2019-05-01 MED ORDER — HYDRALAZINE HCL 20 MG/ML IJ SOLN: 10.0000 mg | INTRAMUSCULAR | Status: DC | PRN

## 2019-05-01 MED ORDER — ACETAMINOPHEN 650 MG RE SUPP: 650.0000 mg | Freq: Four times a day (QID) | RECTAL | Status: DC | PRN

## 2019-05-01 MED ORDER — DIVALPROEX SODIUM 500 MG PO DR TAB
750.0000 mg | DELAYED_RELEASE_TABLET | Freq: Once | ORAL | Status: AC
  Administered 2019-05-01: 250 mg via ORAL
  Filled 2019-05-01: qty 1

## 2019-05-01 MED ORDER — SODIUM CHLORIDE 0.9 % IV BOLUS
500.0000 mL | Freq: Once | INTRAVENOUS | Status: AC
  Administered 2019-05-01: 500 mL via INTRAVENOUS

## 2019-05-01 MED ORDER — TRAZODONE HCL 50 MG PO TABS
50.0000 mg | ORAL_TABLET | Freq: Every evening | ORAL | Status: DC | PRN
  Administered 2019-05-02: 50 mg via ORAL
  Filled 2019-05-01: qty 1

## 2019-05-01 MED ORDER — ONDANSETRON HCL 4 MG/2ML IJ SOLN
4.0000 mg | Freq: Four times a day (QID) | INTRAMUSCULAR | Status: DC | PRN
  Administered 2019-05-02: 4 mg via INTRAVENOUS
  Filled 2019-05-01: qty 2

## 2019-05-01 MED ORDER — PANTOPRAZOLE SODIUM 40 MG IV SOLR
40.0000 mg | Freq: Two times a day (BID) | INTRAVENOUS | Status: DC
  Administered 2019-05-02 (×3): 40 mg via INTRAVENOUS
  Filled 2019-05-01 (×3): qty 40

## 2019-05-01 MED ORDER — POTASSIUM CHLORIDE CRYS ER 20 MEQ PO TBCR
40.0000 meq | EXTENDED_RELEASE_TABLET | Freq: Once | ORAL | Status: AC
  Administered 2019-05-01: 40 meq via ORAL
  Filled 2019-05-01: qty 2

## 2019-05-01 MED ORDER — DROPERIDOL 2.5 MG/ML IJ SOLN
2.5000 mg | Freq: Once | INTRAMUSCULAR | Status: AC
  Administered 2019-05-01: 2.5 mg via INTRAVENOUS
  Filled 2019-05-01: qty 2

## 2019-05-01 MED ORDER — ACETAMINOPHEN 325 MG PO TABS
650.0000 mg | ORAL_TABLET | Freq: Four times a day (QID) | ORAL | Status: DC | PRN
  Administered 2019-05-02: 650 mg via ORAL
  Filled 2019-05-01 (×2): qty 2

## 2019-05-01 NOTE — ED Notes (Signed)
Pt vomited the potassium pills back up. Ordered changed to iv

## 2019-05-01 NOTE — ED Notes (Signed)
Per husband pt was able to keep down saltines.

## 2019-05-01 NOTE — ED Notes (Signed)
Pt did not want to eat a popsicle saying she is cold. Give 2 sips of ginger ale but states she feels like she is going to throw up.

## 2019-05-01 NOTE — H&P (Signed)
History and Physical    Kayla Shah [facility] [id number] DOB: 1955-06-16 DOA: 05/01/2019  PCP: Volney American, PA-C  Patient coming from: home  I have personally briefly reviewed patient's old medical records in [place]  Chief Complaint: intractable nausea and vomiting  HPI: Kayla Shah is a 64 y.o. female with medical history significant of COPD, hypertension, seizure disorder, migraines, hyperlipidemia, GERD, osteoporosis, anxiety depression, CAD, insomnia and agoraphobia who presented to the ED today with intractable nausea and vomiting.  She reports 4 to 5 days ago she began having nausea vomiting and since that time has been unable to keep down any oral intake.  She denies any abdominal pain.  Does report a few episodes of diarrhea, but not frequent or high-volume.  She currently reports being very cold but denies fevers or chills otherwise.  No other recent illnesses.  No known sick contacts.  Denies cough, congestion, sore throat.  No urinary symptoms including dysuria or urinary frequency.  She has not found anything at home to alleviate her symptoms.  Patient does admit to smoking marijuana, sometimes every day, but not always.  She denies having noticed any improvement in her nausea vomiting after taking hot shower.  ED Course: Vital signs were normal other than initially tachypneic which resolved.  Labs were notable for potassium 3.2, chloride 112, creatinine 1.03.  Lipase was normal.  AST mildly elevated 63.  CBC showed macrocytosis without anemia.  No leukocytosis.  Depakote level low at 38.  UA negative for infection.  UDS was positive only for cannabinoids.  Troponins were elevated 159 >> 143 with nonspecific EKG changes inferior lateral leads.  ED physician spoke with the cardiologist on-call indicated no emergent need for cardiac work-up or cath, recommended outpatient evaluation in the near future.  CT of abdomen pelvis was negative for anything acute to  explain her symptoms.  CT head obtained was negative.  Review of Systems: As per HPI otherwise 10 point review of systems negative.    Past Medical History:  Diagnosis Date  . Allergy   . Anxiety    agoraphobia   . Arthritis   . Chicken pox   . COPD (chronic obstructive pulmonary disease) ([place])   . Depression   . GERD (gastroesophageal reflux disease)   . Headache   . History of blood transfusion    1984  . Hyperlipidemia   . Hypertension   . Migraine   . Seizure ([place])    was on keppra    Past Surgical History:  Procedure Laterality Date  . ABDOMINAL HYSTERECTOMY  1984   endometriosis  . APPENDECTOMY    . COLONOSCOPY WITH PROPOFOL N/A 03/26/2017   Procedure: COLONOSCOPY WITH PROPOFOL;  Surgeon: Jonathon Bellows, MD;  Location: [facility] ENDOSCOPY;  Service: Gastroenterology;  Laterality: N/A;  . WISDOM TOOTH EXTRACTION       reports that she has quit smoking. Her smoking use included cigarettes. She has a 11.25 pack-year smoking history. She has never used smokeless tobacco. She reports current drug use. Drug: Marijuana. She reports that she does not drink alcohol.  Allergies  Allergen Reactions  . Paroxetine Hcl Other (See Comments)    Pt. Becomes very aggressive with mood swings.  . Codeine Nausea And Vomiting  . Fluoxetine Anxiety  . Paroxetine Anxiety    Family History  Problem Relation Age of Onset  . Stroke Mother   . Depression Mother   . Arthritis Mother   . Heart disease Father   .  Alcohol abuse Father   . Diabetes Father   . Osteoporosis Sister   . Kidney disease Sister   . Arthritis Sister   . Arthritis Maternal Grandmother   . Heart disease Maternal Grandmother   . Hyperlipidemia Maternal Grandmother   . Diabetes Paternal Grandmother   . Breast cancer Neg Hx      Prior to Admission medications   Medication Sig Start Date End Date Taking? Authorizing Provider  Calcium-Magnesium-Vitamin D (CALCIUM 500 PO) Take 1 tablet by mouth daily.    Yes [provider]  cholecalciferol (VITAMIN D) 1000 units tablet Take 1,000 Units by mouth daily.   Yes [provider]  divalproex (DEPAKOTE) 250 MG DR tablet Take 3 tablets (750 mg total) by mouth 2 (two) times daily. Patient taking differently: Take 250 mg by mouth in the morning, at noon, in the evening, and at bedtime.  06/22/18  Yes Penumalli, Earlean Polka, MD  gabapentin (NEURONTIN) 300 MG capsule TAKE ONE CAPSULE BY MOUTH TWICE A DAY Patient taking differently: Take 300 mg by mouth 2 (two) times daily.  11/09/18  Yes Volney American, PA-C  ibuprofen (ADVIL) 200 MG tablet Take 200-600 mg by mouth every 6 (six) hours as needed for fever or mild pain.    Yes [provider]  megestrol (MEGACE) 20 MG tablet Take 1 tablet (20 mg total) by mouth daily. 03/18/19  Yes Volney American, PA-C  ondansetron (ZOFRAN-ODT) 8 MG disintegrating tablet TAKE 1 TABLET BY MOUTH EVERY 8 HOURS AS NEEDED FOR NAUSEA OR VOMITING Patient taking differently: Take 8 mg by mouth every 8 (eight) hours as needed for nausea or vomiting.  04/28/19  Yes Volney American, PA-C  pantoprazole (PROTONIX) 40 MG tablet TAKE 1 TABLET BY MOUTH TWICE A DAY Patient taking differently: Take 40 mg by mouth 2 (two) times daily.  01/12/19  Yes Volney American, PA-C  simvastatin (ZOCOR) 20 MG tablet TAKE 1 TABLET BY MOUTH DAILY Patient taking differently: Take 20 mg by mouth daily.  11/12/18  Yes Volney American, PA-C  verapamil (CALAN-SR) 240 MG CR tablet TAKE 1 TABLET BY MOUTH DAILY Patient taking differently: Take 240 mg by mouth daily.  12/15/18  Yes Volney American, PA-C  lamoTRIgine (LAMICTAL) 25 MG tablet Take 4 tablets (100 mg total) by mouth 2 (two) times daily. 04/27/19   Lomax, [name], NP    Physical Exam: Vitals:   05/01/19 1230 05/01/19 1300 05/01/19 1400 05/01/19 1430  BP: 133/84 132/75 119/76 114/74  Pulse:      Resp:      Temp:      TempSrc:      SpO2:      Weight:       Height:        Constitutional: NAD, calm, comfortable, mildly ill-appearing Eyes: EOMI, lids and conjunctivae normal ENMT: Mucous membranes are moist.  Hearing grossly normal. Respiratory: CTAB, Normal respiratory effort. No accessory muscle use.  Cardiovascular: RRR, No extremity edema. 2+ pedal pulses.  Abdomen: soft, NT, ND, no masses or HSM palpated. +Bowel sounds.  Musculoskeletal: no clubbing / cyanosis. No joint deformity upper and lower extremities. Normal muscle tone.  Skin: dry, intact, normal color, normal temperature Neurologic: CN 2-12 grossly intact. Normal speech.  Grossly non-focal exam. Psychiatric: Alert and oriented x 3. Normal mood. Congruent affect.  Normal judgement and insight.   Labs on Admission: I have personally reviewed following labs and imaging studies  CBC: Recent Labs  Lab 05/01/19  0948  WBC 7.7  NEUTROABS 4.1  HGB 13.1  HCT 37.9  MCV 104.4*  PLT [id number]*   Basic Metabolic Panel: Recent Labs  Lab 05/01/19 0948  NA 138  K 3.2*  CL 99  CO2 26  GLUCOSE 112*  BUN 21  CREATININE 1.03*  CALCIUM 9.4  MG 1.8   GFR: Estimated Creatinine Clearance: 40.9 mL/min (A) (by C-G formula based on SCr of 1.03 mg/dL (H)). Liver Function Tests: Recent Labs  Lab 05/01/19 0948  AST 63*  ALT 41  ALKPHOS 40  BILITOT 1.2  PROT 7.0  ALBUMIN 3.8   Recent Labs  Lab 05/01/19 0948  LIPASE 25   No results for input(s): AMMONIA in the last 168 hours. Coagulation Profile: No results for input(s): INR, PROTIME in the last 168 hours. Cardiac Enzymes: No results for input(s): CKTOTAL, CKMB, CKMBINDEX, TROPONINI in the last 168 hours. BNP (last 3 results) No results for input(s): PROBNP in the last 8760 hours. HbA1C: No results for input(s): HGBA1C in the last 72 hours. CBG: No results for input(s): GLUCAP in the last 168 hours. Lipid Profile: No results for input(s): CHOL, HDL, LDLCALC, TRIG, CHOLHDL, LDLDIRECT in the last 72 hours. Thyroid Function  Tests: No results for input(s): TSH, T4TOTAL, FREET4, T3FREE, THYROIDAB in the last 72 hours. Anemia Panel: No results for input(s): VITAMINB12, FOLATE, FERRITIN, TIBC, IRON, RETICCTPCT in the last 72 hours. Urine analysis:    Component Value Date/Time   COLORURINE AMBER (A) 05/01/2019 1508   APPEARANCEUR HAZY (A) 05/01/2019 1508   APPEARANCEUR Hazy (A) 09/08/2018 1352   LABSPEC 1.041 (H) 05/01/2019 1508   LABSPEC 1.011 07/01/2012 1751   PHURINE 7.0 05/01/2019 1508   GLUCOSEU NEGATIVE 05/01/2019 1508   GLUCOSEU Negative 07/01/2012 1751   HGBUR SMALL (A) 05/01/2019 1508   BILIRUBINUR NEGATIVE 05/01/2019 1508   BILIRUBINUR Negative 09/08/2018 1352   BILIRUBINUR Negative 07/01/2012 1751   KETONESUR 5 (A) 05/01/2019 1508   PROTEINUR NEGATIVE 05/01/2019 1508   NITRITE NEGATIVE 05/01/2019 1508   LEUKOCYTESUR NEGATIVE 05/01/2019 1508   LEUKOCYTESUR Negative 07/01/2012 1751    Radiological Exams on Admission: CT Head Wo Contrast  Result Date: 05/01/2019 CLINICAL DATA:  Acute headache. EXAM: CT HEAD WITHOUT CONTRAST TECHNIQUE: Contiguous axial images were obtained from the base of the skull through the vertex without intravenous contrast. COMPARISON:  CT scan dated 06/12/2018 FINDINGS: Brain: No evidence of acute infarction, hemorrhage, hydrocephalus, extra-axial collection or mass lesion/mass effect. Diffuse mild atrophy with secondary ventricular dilatation. Vascular: No hyperdense vessel or unexpected calcification. Skull: Normal. Negative for fracture or focal lesion. Sinuses/Orbits: Normal Other: None IMPRESSION: No acute abnormalities. Diffuse mild atrophy. Electronically Signed   By: Lorriane Shire M.D.   On: 05/01/2019 15:14   CT ABDOMEN PELVIS W CONTRAST  Result Date: 05/01/2019 CLINICAL DATA:  Nausea and vomiting. EXAM: CT ABDOMEN AND PELVIS WITH CONTRAST TECHNIQUE: Multidetector CT imaging of the abdomen and pelvis was performed using the standard protocol following bolus  administration of intravenous contrast. CONTRAST:  56mL OMNIPAQUE IOHEXOL 300 MG/ML  SOLN COMPARISON:  December 30, 2017 FINDINGS: Lower chest: The lung bases are clear. The heart size is normal. Hepatobiliary: The liver is normal. Normal gallbladder.There is no biliary ductal dilation. Pancreas: Normal contours without ductal dilatation. No peripancreatic fluid collection. Spleen: No splenic laceration or hematoma. Adrenals/Urinary Tract: --Adrenal glands: No adrenal hemorrhage. --Right kidney/ureter: No hydronephrosis or perinephric hematoma. --Left kidney/ureter: No hydronephrosis or perinephric hematoma. --Urinary bladder: Unremarkable. Stomach/Bowel: --Stomach/Duodenum: No hiatal hernia or  other gastric abnormality. Normal duodenal course and caliber. --Small bowel: No dilatation or inflammation. --Colon: There is scattered colonic diverticula without CT evidence for diverticulitis. --Appendix: Not visualized. No right lower quadrant inflammation or free fluid. Vascular/Lymphatic: Atherosclerotic calcification is present within the non-aneurysmal abdominal aorta, without hemodynamically significant stenosis. --No retroperitoneal lymphadenopathy. --No mesenteric lymphadenopathy. --No pelvic or inguinal lymphadenopathy. Reproductive: Unremarkable Other: No ascites or free air. The abdominal wall is normal. Musculoskeletal. No acute displaced fractures. IMPRESSION: 1. No CT evidence of acute intra-abdominal or intrapelvic injury. 2. Scattered colonic diverticula without diverticulitis. Aortic Atherosclerosis (ICD10-I70.0). Electronically Signed   By: Constance Holster M.D.   On: 05/01/2019 15:18    EKG: Independently reviewed.  Normal sinus rhythm at 77 bpm, normal axis, RSR' in V2 to, ST segment depression in leads II, III, aVF, V4-V6.  Assessment/Plan Principal Problem:   Intractable nausea and vomiting Active Problems:   Elevated troponin   Hypokalemia   Seizures (HCC)   CAD (coronary artery  disease)   Intractable nausea and vomiting -unable to tolerate any p.o. intake for the past 4 to 5 days, including medications.  CT abdomen pelvis was negative.  Lipase was normal.  LFTs mostly normal other than AST of 63.  Patient afebrile and without leukocytosis to suggest infection.  Patient does frequently use marijuana so cannabinoid hyperemesis syndrome is in the differential. --Clear liquid diet, advance as tolerated --IV Zofran as needed --IV Protonix twice daily --Assess for improvement with hot shower if patient feels up to it --Monitor vitals and electrolytes  Hypokalemia -K3.2 on admission, replacing along with IV fluids --BMP in the morning --Replace as needed  Elevated troponin -initial troponin I 59, repeat 1.3.  Patient without chest pain.  EKG does show some inferior and lateral lead ST depressions.  On-call cardiology spoke with ED physician and indicated no need for emergent work-up at this time, recommended outpatient evaluation in the near future. --Continue to trend troponin --Stat EKG if any chest pain --Call on-call cardiologist if troponins rise or further EKG changes are seen  Seizures -patient has been on Depakote for quite some time but due to side effects her neurologist is transitioning her to Lamictal.  She is been able to keep down p.o. meds.  Her Depakote level was low on admission. --Pharmacy consulted for IV Depakote and Lamictal --Hold p.o. meds  Hold home p.o. meds for other chronic comorbidities until patient tolerating p.o.   DVT prophylaxis: Lovenox Code Status: Full Family Communication: None at bedside during encounter Disposition Plan: Expect home in 24 to 48 hours pending improvement Consults called: None, cardiology was contacted in the ED  Admission status: obs    Ezekiel Slocumb, DO [facility]   If 7PM-7AM, please contact night-coverage [email or web address]  05/01/2019, 6:26 PM

## 2019-05-01 NOTE — Discharge Instructions (Signed)
Continue your treatments as neurology recommended.  You need to call the cardiology clinic and get follow-up for the abnormal EKG and cardiac markers.  Continue to take Zofran to help with nausea.  If she stops taking her pills she will need to return to the ER because her Depakote levels are low so is important that she gets back on these so that she does not have another seizure

## 2019-05-01 NOTE — ED Notes (Signed)
Pt has been assisted to toilet two times by this RN since arrival. Pt reporting feeling the need to have a BP. However, pt is unable to urinate or have BM at this time. Pt placed back into bed and reminded to use call light for assistance when needed.

## 2019-05-01 NOTE — ED Notes (Addendum)
Pt keeps spitting out the medication. She is not vomiting it up, just spits it out while it is still in her mouth. Pt keeps unhooking her monitor - I believe to go to the bathroom?

## 2019-05-01 NOTE — ED Provider Notes (Signed)
64 year old female history of depression, anxiety, seizures. Currently being weaned off Depakote onto the Lamictal due to side effects of Depakote.  4 to 5 days ago developed n/v and has not been able to take PO since.  CT head with no acute abnormalities.  CT abdomen/pelvis with no evidence of acute intra-abdominal or intrapelvic injury.  UA negative for infection, mild ketones. HS troponin down trending from 159 to 143 - this and and her EKG findings were discussed with cardiology (Dr. Saralyn Pilar) by Dr. Jari Pigg, EKG and troponin findings were felt to be near/consistent w/ her baseline, no indication for emergent cath at this time per cardiology.  Patient denies any chest pain.    UDS positive for cannabis.   Failed initial PO challenge. Attempted repeat PO challenge with her medications, but is unable to tolerate.  QTC on EKG 427, will trial a small dose of droperidol in case cannabis is contributing.  However, given her multiple failed PO attempts, patient will require admission for further symptom control since she is not able to keep down her medications including her antiepileptics. Consulted pharmacy for assistance w/ conversion to IV dosing.  Discussed this plan of care with the patient and husband at bedside who voiced understanding and are in agreement w/ admission.  Discussed with hospitalist for admission.   Lilia Pro., MD 05/01/19 [phone]

## 2019-05-01 NOTE — ED Triage Notes (Signed)
Pt arrives via ems from home. Ems reports pt seen neurolist this past thurs, changes to seizure meds.were made,  pt to wein off depakote.  Ems states n/v on thursday, husband reports pt not eating or taking medications. Pt a&o to self and situation at this time, unable to state year or month. Pt shaking on arrival and note from apt on Thursday states shaking has been occurring for a while and is the reason for stopping the depakote   BP 125/80 HR 110 96%  cbg 116

## 2019-05-01 NOTE — ED Notes (Signed)
Pt assisted to toilet again without any results

## 2019-05-01 NOTE — ED Provider Notes (Signed)
[facility] Emergency Department Provider Note  ____________________________________________   First MD Initiated Contact with Patient 05/01/19 [phone]     (approximate)  I have reviewed the triage vital signs and the nursing notes.   HISTORY  Chief Complaint Nausea    HPI Kayla Shah is a 64 y.o. female with history of depression, anxiety seizures who is on Depakote 750 twice daily.  Patient was seen by her neurologist on 3/24 on review of records.  During this visit patient was concerned that worsening tremors and worsening depression were secondary to the Depakote.  She wanted to stop this medication and try something different.  They were trialing her on lamotrigine.  Patient was given instructions on how to titrate off the Depakote and onto the Lamictal.  Patient got her first dose on Thursday and it sounds like had some nausea and vomiting and since then has not really been able to eat much.  Sounds like she has not been taking the Lamictal  Patient is oriented times herself.  She states that she had some nausea and vomiting earlier but states that she feels at her baseline self now.  Denies any abdominal pain, fevers, chest pain, shortness of breath.  Patient states that the nausea and vomiting was intermittent, nothing makes it better, nothing makes it worse. Taking zofran at home.           Past Medical History:  Diagnosis Date  . Allergy   . Anxiety    agoraphobia   . Arthritis   . Chicken pox   . COPD (chronic obstructive pulmonary disease) ([place])   . Depression   . GERD (gastroesophageal reflux disease)   . Headache   . History of blood transfusion    1984  . Hyperlipidemia   . Hypertension   . Migraine   . Seizure [facility])    was on keppra    Patient Active Problem List   Diagnosis Date Noted  . Elevated troponin 06/12/2018  . Lung nodule 10/08/2017  . Anxiety and depression 07/30/2017  . Agoraphobia 07/30/2017  . CAD  (coronary artery disease) 07/30/2017  . Hot flashes due to surgical menopause 07/30/2017  . Insomnia 07/30/2017  . Osteoporosis 02/27/2016  . Personal history of tobacco use, presenting hazards to health 10/23/2015  . Primary osteoarthritis of left elbow 10/11/2015  . Migraine 06/07/2015  . Seizures ([place]) 06/07/2015  . Smoker 06/07/2015  . Gastroesophageal reflux disease without esophagitis 06/07/2015  . COPD (chronic obstructive pulmonary disease) with chronic bronchitis ([place]) 08/14/2014  . Essential (primary) hypertension 08/14/2014  . Pure hypercholesterolemia 08/14/2014  . Abnormal brain scan 08/29/2013  . Depression 08/29/2013    Past Surgical History:  Procedure Laterality Date  . ABDOMINAL HYSTERECTOMY  1984   endometriosis  . APPENDECTOMY    . COLONOSCOPY WITH PROPOFOL N/A 03/26/2017   Procedure: COLONOSCOPY WITH PROPOFOL;  Surgeon: Jonathon Bellows, MD;  Location: [facility] ENDOSCOPY;  Service: Gastroenterology;  Laterality: N/A;  . WISDOM TOOTH EXTRACTION      Prior to Admission medications   Medication Sig Start Date End Date Taking? Authorizing Provider  Calcium-Magnesium-Vitamin D (CALCIUM 500 PO) Take 1 tablet by mouth daily.     [provider]  cholecalciferol (VITAMIN D) 1000 units tablet Take 1,000 Units by mouth daily.    [provider]  divalproex (DEPAKOTE) 250 MG DR tablet Take 3 tablets (750 mg total) by mouth 2 (two) times daily. 06/22/18   Penumalli, Earlean Polka, MD  gabapentin (  NEURONTIN) 300 MG capsule TAKE ONE CAPSULE BY MOUTH TWICE A DAY 11/09/18   Volney American, PA-C  ibuprofen (ADVIL) 200 MG tablet Take 200 mg by mouth every 6 (six) hours as needed.    [provider]  lamoTRIgine (LAMICTAL) 25 MG tablet Take 4 tablets (100 mg total) by mouth 2 (two) times daily. 04/27/19   Lomax, [name], NP  megestrol (MEGACE) 20 MG tablet Take 1 tablet (20 mg total) by mouth daily. 03/18/19   Volney American, PA-C  ondansetron (ZOFRAN-ODT) 8  MG disintegrating tablet TAKE 1 TABLET BY MOUTH EVERY 8 HOURS AS NEEDED FOR NAUSEA OR VOMITING 04/28/19   Volney American, PA-C  pantoprazole (PROTONIX) 40 MG tablet TAKE 1 TABLET BY MOUTH TWICE A DAY 01/12/19   Volney American, PA-C  simvastatin (ZOCOR) 20 MG tablet TAKE 1 TABLET BY MOUTH DAILY 11/12/18   Volney American, PA-C  verapamil (CALAN-SR) 240 MG CR tablet TAKE 1 TABLET BY MOUTH DAILY 12/15/18   Volney American, PA-C    Allergies Paroxetine hcl, Codeine, Fluoxetine, and Paroxetine  Family History  Problem Relation Age of Onset  . Stroke Mother   . Depression Mother   . Arthritis Mother   . Heart disease Father   . Alcohol abuse Father   . Diabetes Father   . Osteoporosis Sister   . Kidney disease Sister   . Arthritis Sister   . Arthritis Maternal Grandmother   . Heart disease Maternal Grandmother   . Hyperlipidemia Maternal Grandmother   . Diabetes Paternal Grandmother   . Breast cancer Neg Hx     Social History Social History   Tobacco Use  . Smoking status: Current Every Day Smoker    Packs/day: 0.25    Years: 45.00    Pack years: 11.25    Types: Cigarettes  . Smokeless tobacco: Never Used  Substance Use Topics  . Alcohol use: No  . Drug use: Yes    Types: Marijuana      Review of Systems Constitutional: No fever/chills Eyes: No visual changes. ENT: No sore throat. Cardiovascular: Denies chest pain. Respiratory: Denies shortness of breath. Gastrointestinal: No abdominal pain.  Positive nausea and vomiting no diarrhea.  No constipation. Genitourinary: Negative for dysuria. Musculoskeletal: Negative for back pain. Skin: Negative for rash. Neurological: Negative for headaches, focal weakness or numbness. All other ROS negative ____________________________________________   PHYSICAL EXAM:  VITAL SIGNS: Blood pressure 129/82, pulse 79, temperature 98.6 F (37 C), temperature source Oral, resp. rate 20, height 5\' 7"  (1.702  m), weight 45.8 kg, SpO2 99 %.   Constitutional: Alert and oriented x1. Well appearing and in no acute distress. Eyes: Conjunctivae are normal. EOMI. Head: Atraumatic. Nose: No congestion/rhinnorhea. Mouth/Throat: Mucous membranes are moist.   Neck: No stridor. Trachea Midline. FROM Cardiovascular: Normal rate, regular rhythm. Grossly normal heart sounds.  Good peripheral circulation. Respiratory: Normal respiratory effort.  No retractions. Lungs CTAB. Gastrointestinal: Soft and nontender. No distention. No abdominal bruits.  Musculoskeletal: No lower extremity tenderness nor edema.  No joint effusions. Neurologic:  Normal speech and language. No gross focal neurologic deficits are appreciated.  Tremor noted which on review of neuro notes looks similar to when she was seen by them. Skin:  Skin is warm, dry and intact. No rash noted. Psychiatric: Mood and affect are normal. Speech and behavior are normal. GU: Deferred   ____________________________________________   LABS (all labs ordered are listed, but only abnormal results are displayed)  Labs Reviewed  VALPROIC ACID LEVEL - Abnormal; Notable for the following components:      Result Value   Valproic Acid Lvl 38 (*)    All other components within normal limits  CBC WITH DIFFERENTIAL/PLATELET - Abnormal; Notable for the following components:   RBC 3.63 (*)    MCV 104.4 (*)    MCH 36.1 (*)    Platelets 134 (*)    Monocytes Absolute 1.2 (*)    All other components within normal limits  COMPREHENSIVE METABOLIC PANEL - Abnormal; Notable for the following components:   Potassium 3.2 (*)    Glucose, Bld 112 (*)    Creatinine, Ser 1.03 (*)    AST 63 (*)    GFR calc non Af Amer 58 (*)    All other components within normal limits  URINALYSIS, ROUTINE W REFLEX MICROSCOPIC - Abnormal; Notable for the following components:   Color, Urine AMBER (*)    APPearance HAZY (*)    Specific Gravity, Urine 1.041 (*)    Hgb urine dipstick  SMALL (*)    Ketones, ur 5 (*)    All other components within normal limits  URINE DRUG SCREEN, QUALITATIVE ([facility] ONLY) - Abnormal; Notable for the following components:   Cannabinoid 50 Ng, Ur [place] POSITIVE (*)    All other components within normal limits  TROPONIN I (HIGH SENSITIVITY) - Abnormal; Notable for the following components:   Troponin I (High Sensitivity) 159 (*)    All other components within normal limits  TROPONIN I (HIGH SENSITIVITY) - Abnormal; Notable for the following components:   Troponin I (High Sensitivity) 143 (*)    All other components within normal limits  MAGNESIUM  LIPASE, BLOOD  ETHANOL  LAMOTRIGINE LEVEL   ____________________________________________   ED ECG REPORT I, Vanessa Chilcoot-Vinton, the attending physician, personally viewed and interpreted this ECG.  EKG shows some ST depression in the inferior leads and V4 through V6.  Unable to see the prior EKG from May 2020 but the read from the MD provider was ST segment depression in the inferior and lateral leads that was similar to an EKG that was done earlier that day.  I suspect when I read the prior note that these are similar ST changes as before.  No ST elevation, normal intervals ____________________________________________  RADIOLOGY   Official radiology report(s): CT Head Wo Contrast  Result Date: 05/01/2019 CLINICAL DATA:  Acute headache. EXAM: CT HEAD WITHOUT CONTRAST TECHNIQUE: Contiguous axial images were obtained from the base of the skull through the vertex without intravenous contrast. COMPARISON:  CT scan dated 06/12/2018 FINDINGS: Brain: No evidence of acute infarction, hemorrhage, hydrocephalus, extra-axial collection or mass lesion/mass effect. Diffuse mild atrophy with secondary ventricular dilatation. Vascular: No hyperdense vessel or unexpected calcification. Skull: Normal. Negative for fracture or focal lesion. Sinuses/Orbits: Normal Other: None IMPRESSION: No acute abnormalities. Diffuse  mild atrophy. Electronically Signed   By: Lorriane Shire M.D.   On: 05/01/2019 15:14   CT ABDOMEN PELVIS W CONTRAST  Result Date: 05/01/2019 CLINICAL DATA:  Nausea and vomiting. EXAM: CT ABDOMEN AND PELVIS WITH CONTRAST TECHNIQUE: Multidetector CT imaging of the abdomen and pelvis was performed using the standard protocol following bolus administration of intravenous contrast. CONTRAST:  25mL OMNIPAQUE IOHEXOL 300 MG/ML  SOLN COMPARISON:  December 30, 2017 FINDINGS: Lower chest: The lung bases are clear. The heart size is normal. Hepatobiliary: The liver is normal. Normal gallbladder.There is no biliary ductal dilation. Pancreas: Normal contours without ductal dilatation. No peripancreatic fluid collection.  Spleen: No splenic laceration or hematoma. Adrenals/Urinary Tract: --Adrenal glands: No adrenal hemorrhage. --Right kidney/ureter: No hydronephrosis or perinephric hematoma. --Left kidney/ureter: No hydronephrosis or perinephric hematoma. --Urinary bladder: Unremarkable. Stomach/Bowel: --Stomach/Duodenum: No hiatal hernia or other gastric abnormality. Normal duodenal course and caliber. --Small bowel: No dilatation or inflammation. --Colon: There is scattered colonic diverticula without CT evidence for diverticulitis. --Appendix: Not visualized. No right lower quadrant inflammation or free fluid. Vascular/Lymphatic: Atherosclerotic calcification is present within the non-aneurysmal abdominal aorta, without hemodynamically significant stenosis. --No retroperitoneal lymphadenopathy. --No mesenteric lymphadenopathy. --No pelvic or inguinal lymphadenopathy. Reproductive: Unremarkable Other: No ascites or free air. The abdominal wall is normal. Musculoskeletal. No acute displaced fractures. IMPRESSION: 1. No CT evidence of acute intra-abdominal or intrapelvic injury. 2. Scattered colonic diverticula without diverticulitis. Aortic Atherosclerosis (ICD10-I70.0). Electronically Signed   By: Constance Holster M.D.    On: 05/01/2019 15:18    ____________________________________________   PROCEDURES  Procedure(s) performed (including Critical Care):  Procedures   ____________________________________________   INITIAL IMPRESSION / ASSESSMENT AND PLAN / ED COURSE  Keasha Sirmons was evaluated in Emergency Department on 05/01/2019 for the symptoms described in the history of present illness. She was evaluated in the context of the global COVID-19 pandemic, which necessitated consideration that the patient might be at risk for infection with the SARS-CoV-2 virus that causes COVID-19. Institutional protocols and algorithms that pertain to the evaluation of patients at risk for COVID-19 are in a state of rapid change based on information released by regulatory bodies including the CDC and federal and state organizations. These policies and algorithms were followed during the patient's care in the ED.    Patient is a 64 year old who comes in with some nausea and vomiting.  Abdomen is soft and nontender so low suspicion for acute abdominal pathology.  Will get labs to evaluate for electrolyte abnormalities, AKI, will get Depakote level to evaluate for toxicity.  Will get urine to evaluate for UTI.  Labs show a potassium of 3.2, kidney function slightly elevated but downtrending from 1 month ago  Valproate level is low which is consistent with the husband stating that she has not really been eating or or drinking or taking her medications.  Cardiac marker was 159.  On review of prior record patient when she was admitted for the seizure had a troponin I of 1.42 downtrending to 0.79.  Looks like Dr. Nehemiah Massed was planning to cath.  D/w Dr. Saralyn Pilar, EKG non specific, and trop around similar to prior admission. No indication for emergent cath. Can f/u outpatient if cleared for dc home.   Patient initially vomited up the potassium.  Will get CT head and CT abdomen make sure nothing else is causing her nausea  and vomiting  CT imaging is negative.  Patient was able to tolerate some crackers and a little bit of ginger ale.  We discussed admission if she is unable to take her pillls for the risk of her having seizures.  Patient had off to oncoming team pending been able to take her pills.  If unable to then dc home and if not pt will requiring admission.          ____________________________________________   FINAL CLINICAL IMPRESSION(S) / ED DIAGNOSES   Final diagnoses:  Non-intractable vomiting with nausea, unspecified vomiting type      MEDICATIONS GIVEN DURING THIS VISIT:  Medications  lamoTRIgine (LAMICTAL) tablet 25 mg (25 mg Oral Not Given 05/01/19 1700)  ondansetron (ZOFRAN) injection 4 mg (4 mg Intravenous Given  05/01/19 1038)  sodium chloride 0.9 % bolus 500 mL (0 mLs Intravenous Stopped 05/01/19 1215)  potassium chloride SA (KLOR-CON) CR tablet 40 mEq (40 mEq Oral Given 05/01/19 1209)  iohexol (OMNIPAQUE) 300 MG/ML solution 75 mL (75 mLs Intravenous Contrast Given 05/01/19 1444)  divalproex (DEPAKOTE) DR tablet 750 mg (250 mg Oral Given 05/01/19 1703)  potassium chloride 10 mEq in 100 mL IVPB (0 mEq Intravenous Stopped 05/01/19 1634)     ED Discharge Orders    None       Note:  This document was prepared using Dragon voice recognition software and may include unintentional dictation errors.   Vanessa [place], MD 05/01/19 [phone]

## 2019-05-02 ENCOUNTER — Other Ambulatory Visit: Payer: Self-pay

## 2019-05-02 DIAGNOSIS — R251 Tremor, unspecified: Secondary | ICD-10-CM | POA: Diagnosis present

## 2019-05-02 DIAGNOSIS — D7589 Other specified diseases of blood and blood-forming organs: Secondary | ICD-10-CM | POA: Diagnosis present

## 2019-05-02 DIAGNOSIS — R112 Nausea with vomiting, unspecified: Secondary | ICD-10-CM | POA: Diagnosis present

## 2019-05-02 DIAGNOSIS — J449 Chronic obstructive pulmonary disease, unspecified: Secondary | ICD-10-CM | POA: Diagnosis present

## 2019-05-02 DIAGNOSIS — G4700 Insomnia, unspecified: Secondary | ICD-10-CM | POA: Diagnosis present

## 2019-05-02 DIAGNOSIS — G40909 Epilepsy, unspecified, not intractable, without status epilepticus: Secondary | ICD-10-CM | POA: Diagnosis present

## 2019-05-02 DIAGNOSIS — R778 Other specified abnormalities of plasma proteins: Secondary | ICD-10-CM | POA: Diagnosis present

## 2019-05-02 DIAGNOSIS — Z841 Family history of disorders of kidney and ureter: Secondary | ICD-10-CM | POA: Diagnosis not present

## 2019-05-02 DIAGNOSIS — Z885 Allergy status to narcotic agent status: Secondary | ICD-10-CM | POA: Diagnosis not present

## 2019-05-02 DIAGNOSIS — F1290 Cannabis use, unspecified, uncomplicated: Secondary | ICD-10-CM | POA: Diagnosis present

## 2019-05-02 DIAGNOSIS — Z888 Allergy status to other drugs, medicaments and biological substances status: Secondary | ICD-10-CM | POA: Diagnosis not present

## 2019-05-02 DIAGNOSIS — Z833 Family history of diabetes mellitus: Secondary | ICD-10-CM | POA: Diagnosis not present

## 2019-05-02 DIAGNOSIS — R197 Diarrhea, unspecified: Secondary | ICD-10-CM | POA: Diagnosis present

## 2019-05-02 DIAGNOSIS — E785 Hyperlipidemia, unspecified: Secondary | ICD-10-CM | POA: Diagnosis present

## 2019-05-02 DIAGNOSIS — Z8249 Family history of ischemic heart disease and other diseases of the circulatory system: Secondary | ICD-10-CM | POA: Diagnosis not present

## 2019-05-02 DIAGNOSIS — Z79899 Other long term (current) drug therapy: Secondary | ICD-10-CM | POA: Diagnosis not present

## 2019-05-02 DIAGNOSIS — E876 Hypokalemia: Secondary | ICD-10-CM | POA: Diagnosis present

## 2019-05-02 DIAGNOSIS — Z83438 Family history of other disorder of lipoprotein metabolism and other lipidemia: Secondary | ICD-10-CM | POA: Diagnosis not present

## 2019-05-02 DIAGNOSIS — I10 Essential (primary) hypertension: Secondary | ICD-10-CM | POA: Diagnosis present

## 2019-05-02 DIAGNOSIS — K219 Gastro-esophageal reflux disease without esophagitis: Secondary | ICD-10-CM | POA: Diagnosis present

## 2019-05-02 DIAGNOSIS — M810 Age-related osteoporosis without current pathological fracture: Secondary | ICD-10-CM | POA: Diagnosis present

## 2019-05-02 DIAGNOSIS — Z20822 Contact with and (suspected) exposure to covid-19: Secondary | ICD-10-CM | POA: Diagnosis present

## 2019-05-02 DIAGNOSIS — I2510 Atherosclerotic heart disease of native coronary artery without angina pectoris: Secondary | ICD-10-CM | POA: Diagnosis present

## 2019-05-02 DIAGNOSIS — Z823 Family history of stroke: Secondary | ICD-10-CM | POA: Diagnosis not present

## 2019-05-02 DIAGNOSIS — R569 Unspecified convulsions: Secondary | ICD-10-CM | POA: Diagnosis not present

## 2019-05-02 LAB — COMPREHENSIVE METABOLIC PANEL
ALT: 48 U/L — ABNORMAL HIGH (ref 0–44)
AST: 81 U/L — ABNORMAL HIGH (ref 15–41)
Albumin: 2.6 g/dL — ABNORMAL LOW (ref 3.5–5.0)
Alkaline Phosphatase: 29 U/L — ABNORMAL LOW (ref 38–126)
Anion gap: 7 (ref 5–15)
BUN: 18 mg/dL (ref 8–23)
CO2: 26 mmol/L (ref 22–32)
Calcium: 8.2 mg/dL — ABNORMAL LOW (ref 8.9–10.3)
Chloride: 107 mmol/L (ref 98–111)
Creatinine, Ser: 0.89 mg/dL (ref 0.44–1.00)
GFR calc Af Amer: >60 mL/min (ref >60)
GFR calc non Af Amer: >60 mL/min (ref >60)
Glucose, Bld: 79 mg/dL (ref 70–99)
Potassium: 3.7 mmol/L (ref 3.5–5.1)
Sodium: 140 mmol/L (ref 135–145)
Total Bilirubin: 0.9 mg/dL (ref 0.3–1.2)
Total Protein: 5 g/dL — ABNORMAL LOW (ref 6.5–8.1)

## 2019-05-02 LAB — CBC
HCT: 29.8 % — ABNORMAL LOW (ref 36.0–46.0)
Hemoglobin: 10.2 g/dL — ABNORMAL LOW (ref 12.0–15.0)
MCH: 36.6 pg — ABNORMAL HIGH (ref 26.0–34.0)
MCHC: 34.2 g/dL (ref 30.0–36.0)
MCV: 106.8 fL — ABNORMAL HIGH (ref 80.0–100.0)
Platelets: 83 10*3/uL — ABNORMAL LOW (ref 150–400)
RBC: 2.79 MIL/uL — ABNORMAL LOW (ref 3.87–5.11)
RDW: 14.7 % (ref 11.5–15.5)
WBC: 6.7 10*3/uL (ref 4.0–10.5)
nRBC: 0 % (ref 0.0–0.2)

## 2019-05-02 LAB — HEPATITIS PANEL, ACUTE
HCV Ab: NONREACTIVE
Hep A IgM: NONREACTIVE
Hep B C IgM: NONREACTIVE
Hepatitis B Surface Ag: NONREACTIVE

## 2019-05-02 LAB — SARS CORONAVIRUS 2 (TAT 6-24 HRS): SARS Coronavirus 2: NEGATIVE

## 2019-05-02 MED ORDER — KCL-LACTATED RINGERS 20 MEQ/L IV SOLN: INTRAVENOUS | Status: DC

## 2019-05-02 MED ORDER — LAMOTRIGINE 25 MG PO TABS
50.0000 mg | ORAL_TABLET | Freq: Two times a day (BID) | ORAL | Status: DC
  Administered 2019-05-02 – 2019-05-03 (×2): 50 mg via ORAL
  Filled 2019-05-02 (×2): qty 2

## 2019-05-02 MED ORDER — POTASSIUM CHLORIDE 2 MEQ/ML IV SOLN
INTRAVENOUS | Status: DC
  Filled 2019-05-02 (×3): qty 1000

## 2019-05-02 NOTE — TOC Initial Note (Signed)
Transition of Care [facility]) - Initial/Assessment Note    Patient Details  Name: Kayla Shah MRN: [id number] Date of Birth: 10-Apr-1955  [facility] CM/SW Contact:    Candie Chroman, LCSW Phone Number: 05/02/2019, 4:49 PM  Clinical Narrative:  CSW met with patient. Husband at bedside. CSW introduced role and explained that PT recommendations would be discussed. Patient and her husband agreeable to home health. Explained frequent difficulty with finding agencies to take her insurance. [employer] a favored insurance this morning so was able to pair patient's [employer] with this. Patient and her husband agreeable. Likely discharge tomorrow. No further concerns. CSW encouraged patient to contact CSW as needed. CSW will continue to follow patient and her husband for support and facilitate return home when stable.                Expected Discharge Plan: [place] Barriers to Discharge: Barriers Resolved   Patient Goals and CMS Choice   CMS Medicare.gov Compare Post Acute Care list provided to:: Patient(Husband at bedside.)    Expected Discharge Plan and Services Expected Discharge Plan: [place] Choice: [place] arrangements for the past 2 months: Single Family Home                                      Prior Living Arrangements/Services Living arrangements for the past 2 months: Single Family Home Lives with:: Spouse Patient language and need for interpreter reviewed:: Yes Do you feel safe going back to the place where you live?: Yes      Need for Family Participation in Patient Care: Yes (Comment) Care giver support system in place?: Yes (comment) Current home services: DME Criminal Activity/Legal Involvement Pertinent to Current Situation/Hospitalization: No - Comment as needed  Activities of Daily Living Home Assistive Devices/Equipment: None, Eyeglasses ADL Screening (condition at  time of admission) Patient's cognitive ability adequate to safely complete daily activities?: Yes Is the patient deaf or have difficulty hearing?: No Does the patient have difficulty seeing, even when wearing glasses/contacts?: Yes Does the patient have difficulty concentrating, remembering, or making decisions?: No Patient able to express need for assistance with ADLs?: No Does the patient have difficulty dressing or bathing?: No Independently performs ADLs?: Yes (appropriate for developmental age) Does the patient have difficulty walking or climbing stairs?: No Weakness of Legs: None Weakness of Arms/Hands: None  Permission Sought/Granted Permission sought to share information with : Facility Sport and exercise psychologist, Family Supports Permission granted to share information with : Yes, Verbal Permission Granted  Share Information with NAME: Jacalyn Lefevre  Permission granted to share info w AGENCY: [place] granted to share info w Relationship: Significant Other/Spouse  Permission granted to share info w Contact Information: [phone]  Emotional Assessment Appearance:: Appears stated age Attitude/Demeanor/Rapport: Engaged, Gracious Affect (typically observed): Accepting, Appropriate, Calm, Pleasant Orientation: : Oriented to Self, Oriented to Place, Oriented to  Time, Oriented to Situation Alcohol / Substance Use: Not Applicable Psych Involvement: No (comment)  Admission diagnosis:  Intractable nausea and vomiting [R11.2] Non-intractable vomiting with nausea, unspecified vomiting type [R11.2] Patient Active Problem List   Diagnosis Date Noted  . Intractable nausea and vomiting 05/01/2019  . Hypokalemia 05/01/2019  . Elevated troponin 06/12/2018  . Lung nodule 10/08/2017  . Anxiety and depression 07/30/2017  . Agoraphobia 07/30/2017  .  CAD (coronary artery disease) 07/30/2017  . Hot flashes due to surgical menopause 07/30/2017  . Insomnia 07/30/2017   . Osteoporosis 02/27/2016  . Personal history of tobacco use, presenting hazards to health 10/23/2015  . Primary osteoarthritis of left elbow 10/11/2015  . Migraine 06/07/2015  . Seizures ([place]) 06/07/2015  . Smoker 06/07/2015  . Gastroesophageal reflux disease without esophagitis 06/07/2015  . COPD (chronic obstructive pulmonary disease) with chronic bronchitis ([place]) 08/14/2014  . Essential (primary) hypertension 08/14/2014  . Pure hypercholesterolemia 08/14/2014  . Abnormal brain scan 08/29/2013  . Depression 08/29/2013   PCP:  Volney American, PA-C Pharmacy:   [place], [place] - [address] [place] [address] Phone: [phone] Fax: [phone]     Social Determinants of Health (SDOH) Interventions    Readmission Risk Interventions No flowsheet data found.

## 2019-05-02 NOTE — Evaluation (Signed)
Physical Therapy Evaluation Patient Details Name: Kayla Shah [facility] MRN: [id number] DOB: [birth year] Today's Date: 05/02/2019   History of Present Illness  64 y.o. female with medical history significant of COPD, hypertension, seizure disorder, migraines, hyperlipidemia, GERD, osteoporosis, anxiety, previous smoker, depression, CAD, use of marijuana insomnia and agoraphobia who presented to the ED today with intractable nausea and vomiting.    Clinical Impression  Patient alert, seated on BSC. Pt family at bedside, per family and pt, at baseline pt ambulatory without AD, independent for ADLs. Family stated pt will reach for external support while walking.  The patient demonstrated stand pivot transfer with CGA, and sit <> stand several times with supervision/CGA. BP assessed in multiple positions, WFLs and pt denied lightheadedness/dizziness. Ambulated ~32ft with PT and hand on IV pole, instructed to utilize [facility] at home upon discharge and benefits of HHPT to maximize safety, mobility, and balance. Up in chair with all needs in reach, no complaints of nausea throughout session. The patient demonstrated mild deficits compared to PLOF, and has significant assistance at home. Recommendation is HHPT with supervision for mobility/OOB.     Follow Up Recommendations Home health PT;Supervision for mobility/OOB    Equipment Recommendations  Cane;Other (comment)(Pt has SPC at home)    Recommendations for Other Services       Precautions / Restrictions Precautions Precautions: Fall Restrictions Weight Bearing Restrictions: No      Mobility  Bed Mobility Overal bed mobility: Modified Independent                Transfers Overall transfer level: Needs assistance Equipment used: None;1 person hand held assist Transfers: Sit to/from Stand Sit to Stand: Min guard;Supervision         General transfer comment: handheld assist provided but not required  Ambulation/Gait   Gait Distance  (Feet): 55 Feet Assistive device: IV Pole       General Gait Details: Pt placed hand on IV pole for general steadying, some deficits noted in balance but no LOB. Pt tremulous  Stairs            Wheelchair Mobility    Modified Rankin (Stroke Patients Only)       Balance Overall balance assessment: Needs assistance Sitting-balance support: Feet supported Sitting balance-Leahy Scale: Good       Standing balance-Leahy Scale: Good                               Pertinent Vitals/Pain Pain Assessment: No/denies pain    Home Living Family/patient expects to be discharged to:: Private residence Living Arrangements: Spouse/significant other Available Help at Discharge: Family Type of Home: House Home Access: Stairs to enter Entrance Stairs-Rails: Right;Left;Can reach both Entrance Stairs-Number of Steps: 3-4 stairs Home Layout: One level Home Equipment: Cane - single point;Shower seat;Grab bars - tub/shower      Prior Function Level of Independence: Independent         Comments: pt does not drive, husband performs majority of cooking/cleaning. Per family pt often reaches for external support at home.     Hand Dominance        Extremity/Trunk Assessment   Upper Extremity Assessment Upper Extremity Assessment: Defer to OT evaluation    Lower Extremity Assessment Lower Extremity Assessment: Generalized weakness    Cervical / Trunk Assessment Cervical / Trunk Assessment: Normal  Communication   Communication: No difficulties  Cognition Arousal/Alertness: Awake/alert Behavior During Therapy: WFL for tasks assessed/performed Overall Cognitive  Status: Within Functional Limits for tasks assessed                                        General Comments      Exercises Other Exercises Other Exercises: pt demonstrated stand pivot transfer from [facility] to bed with supervision. Other Exercises: BP assessed in sitting and standing,  and standing at 3 minutes, no orthostatic hypotension noted, RN informed   Assessment/Plan    PT Assessment Patient needs continued PT services  PT Problem List Decreased strength;Decreased mobility;Decreased activity tolerance;Decreased balance       PT Treatment Interventions DME instruction;Therapeutic activities;Gait training;Therapeutic exercise;Stair training;Balance training;Patient/family education;Functional mobility training;Neuromuscular re-education    PT Goals (Current goals can be found in the Care Plan section)  Acute Rehab PT Goals Patient Stated Goal: to go home PT Goal Formulation: With patient Time For Goal Achievement: 05/16/19 Potential to Achieve Goals: Good    Frequency Min 2X/week   Barriers to discharge        Co-evaluation               AM-PAC PT "6 Clicks" Mobility  Outcome Measure Help needed turning from your back to your side while in a flat bed without using bedrails?: None Help needed moving from lying on your back to sitting on the side of a flat bed without using bedrails?: None Help needed moving to and from a bed to a chair (including a wheelchair)?: None Help needed standing up from a chair using your arms (e.g., wheelchair or bedside chair)?: A Little Help needed to walk in hospital room?: A Little Help needed climbing 3-5 steps with a railing? : A Little 6 Click Score: 21    End of Session Equipment Utilized During Treatment: Gait belt Activity Tolerance: Patient tolerated treatment well Patient left: in chair;with family/visitor present;with call bell/phone within reach Nurse Communication: Mobility status PT Visit Diagnosis: Unsteadiness on feet (R26.81);Difficulty in walking, not elsewhere classified (R26.2)    Time: [id number] PT Time Calculation (min) (ACUTE ONLY): 18 min   Charges:   PT Evaluation $PT Eval Low Complexity: 1 Low PT Treatments $Therapeutic Exercise: 8-22 mins       Lieutenant Diego PT, DPT 2:15  PM,05/02/19

## 2019-05-02 NOTE — Progress Notes (Signed)
PROGRESS NOTE    Kayla Shah [facility]  [id number] DOB: November 25, 1955 DOA: 05/01/2019  PCP: Volney American, PA-C    LOS - 0   Brief Narrative:  Kayla Shah is a 64 y.o. female with medical history significant of COPD, hypertension, seizure disorder, migraines, hyperlipidemia, GERD, osteoporosis, anxiety depression, CAD, insomnia and agoraphobia who presented to the ED today with intractable nausea and vomiting.   In the ED, labs were essentially normal except for hypokalemia.  Patient has been unable to take her medications, including antiepileptics for several days.  Admitted for supportive care and IV antiepileptic medication.  Subjective 3/29: Patient feeling somewhat better.  Still intermittently nauseous.  Has kept down small amount of liquids, but still unable to take oral medications.  Wasn't able to eat lunch.  Denies fever/chills or abdominal pain.    Assessment & Plan:   Principal Problem:   Intractable nausea and vomiting Active Problems:   Elevated troponin   Hypokalemia   Seizures (HCC)   CAD (coronary artery disease)  Intractable nausea and vomiting -unable to tolerate any p.o. intake for the past 4 to 5 days, including medications.  CT abdomen pelvis was negative.  Lipase was normal.  LFTs mostly normal other than AST of 63.  Patient afebrile and without leukocytosis to suggest infection.  Patient does frequently use marijuana so cannabinoid hyperemesis syndrome is in the differential. --Clear liquid diet, advance as tolerated --IV Zofran as needed --IV Protonix twice daily --Assess for improvement with hot shower if patient feels up to it --Monitor vitals and electrolytes  Hypokalemia -K3.2 on admission, replacing along with IV fluids --BMP in the morning --Replace as needed  Elevated troponin -initial troponin I 59, repeat 1.3.  Patient without chest pain.  EKG does show some inferior and lateral lead ST depressions.  On-call cardiology spoke with  ED physician and indicated no need for emergent work-up at this time, recommended outpatient evaluation in the near future. --Continue to trend troponin --Stat EKG if any chest pain --Call on-call cardiologist if troponins rise or further EKG changes are seen  Seizures -patient has been on Depakote for quite some time but due to side effects her neurologist is transitioning her to Lamictal.  She is been able to keep down p.o. meds.  Her Depakote level was low on admission. --Pharmacy consulted for IV Depakote and Lamictal --Hold p.o. meds  Hold home p.o. meds for other chronic comorbidities until patient tolerating p.o.   DVT prophylaxis: Lovenox   Code Status: Full Code  Family Communication: Husband at bedside during encounter   Disposition Plan:  Home pending tolerance for oral intake, including medications. Coming From Home Exp DC Date 3/30 Barriers as above Medically Stable for Discharge? no   Patient status changed to inpatient.  Patient is still unable to tolerate oral medications or sufficient PO intake to safely return home today.  Will continue IV fluids and IV antiepileptic medication until patient able to take PO.   Consultants:   Neurology  Procedures:   none  Antimicrobials:   none    Objective: Vitals:   05/02/19 0440 05/02/19 0449 05/02/19 0615 05/02/19 1426  BP: (!) 89/44 (!) 98/50  104/68  Pulse: 61   68  Resp: 18  16 15   Temp: 99.1 F (37.3 C)   98.6 F (37 C)  TempSrc: Oral   Oral  SpO2: 97%   100%  Weight:      Height:        Intake/Output  Summary (Last 24 hours) at 05/02/2019 1644 Last data filed at 05/02/2019 1023 Gross per 24 hour  Intake 1020 ml  Output 0 ml  Net 1020 ml   Filed Weights   05/01/19 0940 05/01/19 2218  Weight: 45.8 kg 47.5 kg    Examination:  General exam: awake, alert, no acute distress, underweight HEENT: moist mucus membranes, hearing grossly normal  Respiratory system: CTAB, no wheezes, rales or rhonchi,  normal respiratory effort. Cardiovascular system: normal S1/S2, RRR, no pedal edema.   Gastrointestinal system: soft, NT, ND, no HSM felt, +bowel sounds. Central nervous system: A&O x4. no gross focal neurologic deficits, normal speech Extremities: moves all, no edema, normal tone Psychiatry: normal mood, congruent affect, judgement and insight appear normal    Data Reviewed: I have personally reviewed following labs and imaging studies  CBC: Recent Labs  Lab 05/01/19 0948 05/02/19 0457  WBC 7.7 6.7  NEUTROABS 4.1  --   HGB 13.1 10.2*  HCT 37.9 29.8*  MCV 104.4* 106.8*  PLT 134* 83*   Basic Metabolic Panel: Recent Labs  Lab 05/01/19 0948 05/02/19 0457  NA 138 140  K 3.2* 3.7  CL 99 107  CO2 26 26  GLUCOSE 112* 79  BUN 21 18  CREATININE 1.03* 0.89  CALCIUM 9.4 8.2*  MG 1.8  --    GFR: Estimated Creatinine Clearance: 49.1 mL/min (by C-G formula based on SCr of 0.89 mg/dL). Liver Function Tests: Recent Labs  Lab 05/01/19 0948 05/02/19 0457  AST 63* 81*  ALT 41 48*  ALKPHOS 40 29*  BILITOT 1.2 0.9  PROT 7.0 5.0*  ALBUMIN 3.8 2.6*   Recent Labs  Lab 05/01/19 0948  LIPASE 25   No results for input(s): AMMONIA in the last 168 hours. Coagulation Profile: No results for input(s): INR, PROTIME in the last 168 hours. Cardiac Enzymes: No results for input(s): CKTOTAL, CKMB, CKMBINDEX, TROPONINI in the last 168 hours. BNP (last 3 results) No results for input(s): PROBNP in the last 8760 hours. HbA1C: No results for input(s): HGBA1C in the last 72 hours. CBG: No results for input(s): GLUCAP in the last 168 hours. Lipid Profile: No results for input(s): CHOL, HDL, LDLCALC, TRIG, CHOLHDL, LDLDIRECT in the last 72 hours. Thyroid Function Tests: No results for input(s): TSH, T4TOTAL, FREET4, T3FREE, THYROIDAB in the last 72 hours. Anemia Panel: No results for input(s): VITAMINB12, FOLATE, FERRITIN, TIBC, IRON, RETICCTPCT in the last 72 hours. Sepsis Labs: No  results for input(s): PROCALCITON, LATICACIDVEN in the last 168 hours.  Recent Results (from the past 240 hour(s))  SARS CORONAVIRUS 2 (TAT 6-24 HRS) Nasopharyngeal Nasopharyngeal Swab     Status: None   Collection Time: 05/01/19  6:46 PM   Specimen: Nasopharyngeal Swab  Result Value Ref Range Status   SARS Coronavirus 2 NEGATIVE NEGATIVE Final    Comment: (NOTE) SARS-CoV-2 target nucleic acids are NOT DETECTED. The SARS-CoV-2 RNA is generally detectable in upper and lower respiratory specimens during the acute phase of infection. Negative results do not preclude SARS-CoV-2 infection, do not rule out co-infections with other pathogens, and should not be used as the sole basis for treatment or other patient management decisions. Negative results must be combined with clinical observations, patient history, and epidemiological information. The expected result is Negative. Fact Sheet for Patients: SugarRoll.be Fact Sheet for Healthcare Providers: https://www.woods-mathews.com/ This test is not yet approved or cleared by the [place] FDA and  has been authorized for detection and/or diagnosis of SARS-CoV-2 by FDA under  an Emergency Use Authorization (EUA). This EUA will remain  in effect (meaning this test can be used) for the duration of the COVID-19 declaration under Section 56 4(b)(1) of the Act, 21 U.S.C. section 360bbb-3(b)(1), unless the authorization is terminated or revoked sooner. Performed at [facility], [place] [address], [place], [place] [address]          Radiology Studies: CT Head Wo Contrast  Result Date: 05/01/2019 CLINICAL DATA:  Acute headache. EXAM: CT HEAD WITHOUT CONTRAST TECHNIQUE: Contiguous axial images were obtained from the base of the skull through the vertex without intravenous contrast. COMPARISON:  CT scan dated 06/12/2018 FINDINGS: Brain: No evidence of acute infarction, hemorrhage, hydrocephalus,  extra-axial collection or mass lesion/mass effect. Diffuse mild atrophy with secondary ventricular dilatation. Vascular: No hyperdense vessel or unexpected calcification. Skull: Normal. Negative for fracture or focal lesion. Sinuses/Orbits: Normal Other: None IMPRESSION: No acute abnormalities. Diffuse mild atrophy. Electronically Signed   By: Lorriane Shire M.D.   On: 05/01/2019 15:14   CT ABDOMEN PELVIS W CONTRAST  Result Date: 05/01/2019 CLINICAL DATA:  Nausea and vomiting. EXAM: CT ABDOMEN AND PELVIS WITH CONTRAST TECHNIQUE: Multidetector CT imaging of the abdomen and pelvis was performed using the standard protocol following bolus administration of intravenous contrast. CONTRAST:  36mL OMNIPAQUE IOHEXOL 300 MG/ML  SOLN COMPARISON:  December 30, 2017 FINDINGS: Lower chest: The lung bases are clear. The heart size is normal. Hepatobiliary: The liver is normal. Normal gallbladder.There is no biliary ductal dilation. Pancreas: Normal contours without ductal dilatation. No peripancreatic fluid collection. Spleen: No splenic laceration or hematoma. Adrenals/Urinary Tract: --Adrenal glands: No adrenal hemorrhage. --Right kidney/ureter: No hydronephrosis or perinephric hematoma. --Left kidney/ureter: No hydronephrosis or perinephric hematoma. --Urinary bladder: Unremarkable. Stomach/Bowel: --Stomach/Duodenum: No hiatal hernia or other gastric abnormality. Normal duodenal course and caliber. --Small bowel: No dilatation or inflammation. --Colon: There is scattered colonic diverticula without CT evidence for diverticulitis. --Appendix: Not visualized. No right lower quadrant inflammation or free fluid. Vascular/Lymphatic: Atherosclerotic calcification is present within the non-aneurysmal abdominal aorta, without hemodynamically significant stenosis. --No retroperitoneal lymphadenopathy. --No mesenteric lymphadenopathy. --No pelvic or inguinal lymphadenopathy. Reproductive: Unremarkable Other: No ascites or free air.  The abdominal wall is normal. Musculoskeletal. No acute displaced fractures. IMPRESSION: 1. No CT evidence of acute intra-abdominal or intrapelvic injury. 2. Scattered colonic diverticula without diverticulitis. Aortic Atherosclerosis (ICD10-I70.0). Electronically Signed   By: Constance Holster M.D.   On: 05/01/2019 15:18        Scheduled Meds: . enoxaparin (LOVENOX) injection  40 mg Subcutaneous Q24H  . lamoTRIgine  25 mg Oral Once  . lamoTRIgine  50 mg Oral BID  . pantoprazole (PROTONIX) IV  40 mg Intravenous Q12H   Continuous Infusions: . lactated ringers 1,000 mL with potassium chloride 20 mEq infusion    . levETIRAcetam Stopped (05/02/19 0747)     LOS: 0 days    Time spent: 30 minutes    Ezekiel Slocumb, DO [facility]   If 7PM-7AM, please contact night-coverage [email or web address] 05/02/2019, 4:44 PM

## 2019-05-02 NOTE — Consult Note (Addendum)
Reason for Consult:Antiepielptic recommendations Requesting Physician: Arbutus Ped  CC: Nausea and vomiting  I have been asked by Dr. Arbutus Ped to see this patient in consultation for antiepileptic medication recommendations.  HPI: Kayla Shah is an 64 y.o. female with medical history significant of COPD, hypertension, seizure disorder, migraines, hyperlipidemia, GERD, osteoporosis, anxiety depression, CAD, insomnia and agoraphobia who presented to the ED today with intractable nausea and vomiting.  Due to her vomiting she has been unable to keep her antiepileptic medication down.  Patient was able to eat breakfast this morning.   Consult called for further recommendations.    Past Medical History:  Diagnosis Date  . Allergy   . Anxiety    agoraphobia   . Arthritis   . Chicken pox   . COPD (chronic obstructive pulmonary disease) ([place])   . Depression   . GERD (gastroesophageal reflux disease)   . Headache   . History of blood transfusion    1984  . Hyperlipidemia   . Hypertension   . Migraine   . Seizure ([place])    was on keppra    Past Surgical History:  Procedure Laterality Date  . ABDOMINAL HYSTERECTOMY  1984   endometriosis  . APPENDECTOMY    . COLONOSCOPY WITH PROPOFOL N/A 03/26/2017   Procedure: COLONOSCOPY WITH PROPOFOL;  Surgeon: Jonathon Bellows, MD;  Location: [facility] ENDOSCOPY;  Service: Gastroenterology;  Laterality: N/A;  . WISDOM TOOTH EXTRACTION      Family History  Problem Relation Age of Onset  . Stroke Mother   . Depression Mother   . Arthritis Mother   . Heart disease Father   . Alcohol abuse Father   . Diabetes Father   . Osteoporosis Sister   . Kidney disease Sister   . Arthritis Sister   . Arthritis Maternal Grandmother   . Heart disease Maternal Grandmother   . Hyperlipidemia Maternal Grandmother   . Diabetes Paternal Grandmother   . Breast cancer Neg Hx     Social History:  reports that she has quit smoking. Her smoking use included cigarettes.  She has a 11.25 pack-year smoking history. She has never used smokeless tobacco. She reports current drug use. Drug: Marijuana. She reports that she does not drink alcohol.  Allergies  Allergen Reactions  . Paroxetine Hcl Other (See Comments)    Pt. Becomes very aggressive with mood swings.  . Codeine Nausea And Vomiting  . Fluoxetine Anxiety  . Paroxetine Anxiety    Medications:  I have reviewed the patient's current medications. Prior to Admission:  Medications Prior to Admission  Medication Sig Dispense Refill Last Dose  . Calcium-Magnesium-Vitamin D (CALCIUM 500 PO) Take 1 tablet by mouth daily.      . cholecalciferol (VITAMIN D) 1000 units tablet Take 1,000 Units by mouth daily.     . divalproex (DEPAKOTE) 250 MG DR tablet Take 3 tablets (750 mg total) by mouth 2 (two) times daily. (Patient taking differently: Take 250 mg by mouth in the morning, at noon, in the evening, and at bedtime. ) 540 tablet 4 72+ hours at Unknown  . gabapentin (NEURONTIN) 300 MG capsule TAKE ONE CAPSULE BY MOUTH TWICE A DAY (Patient taking differently: Take 300 mg by mouth 2 (two) times daily. ) 60 capsule 3 72+ hours at Unknown  . ibuprofen (ADVIL) 200 MG tablet Take 200-600 mg by mouth every 6 (six) hours as needed for fever or mild pain.    Unknown at PRN  . megestrol (MEGACE) 20 MG tablet Take 1  tablet (20 mg total) by mouth daily. 30 tablet 0   . ondansetron (ZOFRAN-ODT) 8 MG disintegrating tablet TAKE 1 TABLET BY MOUTH EVERY 8 HOURS AS NEEDED FOR NAUSEA OR VOMITING (Patient taking differently: Take 8 mg by mouth every 8 (eight) hours as needed for nausea or vomiting. ) 30 tablet 0 Unknown at PRN  . pantoprazole (PROTONIX) 40 MG tablet TAKE 1 TABLET BY MOUTH TWICE A DAY (Patient taking differently: Take 40 mg by mouth 2 (two) times daily. ) 60 tablet 2 72+ hours at Unknown  . simvastatin (ZOCOR) 20 MG tablet TAKE 1 TABLET BY MOUTH DAILY (Patient taking differently: Take 20 mg by mouth daily. ) 90 tablet 1  72+ hours at Unknown  . verapamil (CALAN-SR) 240 MG CR tablet TAKE 1 TABLET BY MOUTH DAILY (Patient taking differently: Take 240 mg by mouth daily. ) 90 tablet 1 72+ hours at Unknown  . lamoTRIgine (LAMICTAL) 25 MG tablet Take 4 tablets (100 mg total) by mouth 2 (two) times daily. 240 tablet 2    Scheduled: . enoxaparin (LOVENOX) injection  40 mg Subcutaneous Q24H  . lamoTRIgine  25 mg Oral Once  . pantoprazole (PROTONIX) IV  40 mg Intravenous Q12H    ROS: History obtained from the patient  General ROS: negative for - chills, fatigue, fever, night sweats, weight gain or weight loss Psychological ROS: memory difficulties Ophthalmic ROS: negative for - blurry vision, double vision, eye pain or loss of vision ENT ROS: negative for - epistaxis, nasal discharge, oral lesions, sore throat, tinnitus or vertigo Allergy and Immunology ROS: negative for - hives or itchy/watery eyes Hematological and Lymphatic ROS: negative for - bleeding problems, bruising or swollen lymph nodes Endocrine ROS: negative for - galactorrhea, hair pattern changes, polydipsia/polyuria or temperature intolerance Respiratory ROS: negative for - cough, hemoptysis, shortness of breath or wheezing Cardiovascular ROS: negative for - chest pain, dyspnea on exertion, edema or irregular heartbeat Gastrointestinal ROS: nausea/vomiting Genito-Urinary ROS: negative for - dysuria, hematuria, incontinence or urinary frequency/urgency Musculoskeletal ROS: negative for - joint swelling or muscular weakness Neurological ROS: as noted in HPI Dermatological ROS: negative for rash and skin lesion changes  Physical Examination: Blood pressure (!) 98/50, pulse 61, temperature 99.1 F (37.3 C), temperature source Oral, resp. rate 16, height 5\' 7"  (1.702 m), weight 47.5 kg, SpO2 97 %.  HEENT-  Normocephalic, no lesions, without obvious abnormality.  Normal external eye and conjunctiva.  Normal TM's bilaterally.  Normal auditory canals and  external ears. Normal external nose, mucus membranes and septum.  Normal pharynx. Cardiovascular- S1, S2 normal, pulses palpable throughout   Lungs- chest clear, no wheezing, rales, normal symmetric air entry Abdomen- soft, non-tender; bowel sounds normal; no masses,  no organomegaly Extremities- no edema Lymph-no adenopathy palpable Musculoskeletal-no joint tenderness, deformity or swelling Skin-warm and dry, no hyperpigmentation, vitiligo, or suspicious lesions  Neurological Examination   Mental Status: Alert, oriented, thought content appropriate.  Speech fluent without evidence of aphasia.  Able to follow 3 step commands without difficulty. Cranial Nerves: II: Discs flat bilaterally; Visual fields grossly normal, pupils equal, round, reactive to light and accommodation III,IV, VI: ptosis not present, extra-ocular motions intact bilaterally V,VII: smile symmetric, facial light touch sensation normal bilaterally VIII: hearing normal bilaterally IX,X: gag reflex present XI: bilateral shoulder shrug XII: midline tongue extension Motor: Right : Upper extremity   5/5    Left:     Upper extremity   5/5  Lower extremity   5/5  Lower extremity   5/5 Tone and bulk:normal tone throughout; no atrophy noted Sensory: Pinprick and light touch intact throughout, bilaterally Deep Tendon Reflexes: Symmetric throughout Plantars: Right: downgoing   Left: downgoing Cerebellar: Normal finger-to-nose and normal heel-to-shin testing bilaterally.  Head tremor and BUE tremor noted Gait: not tested due to safety concerns    Laboratory Studies:   Basic Metabolic Panel: Recent Labs  Lab 05/01/19 0948 05/02/19 0457  NA 138 140  K 3.2* 3.7  CL 99 107  CO2 26 26  GLUCOSE 112* 79  BUN 21 18  CREATININE 1.03* 0.89  CALCIUM 9.4 8.2*  MG 1.8  --     Liver Function Tests: Recent Labs  Lab 05/01/19 0948 05/02/19 0457  AST 63* 81*  ALT 41 48*  ALKPHOS 40 29*  BILITOT 1.2 0.9  PROT 7.0 5.0*   ALBUMIN 3.8 2.6*   Recent Labs  Lab 05/01/19 0948  LIPASE 25   No results for input(s): AMMONIA in the last 168 hours.  CBC: Recent Labs  Lab 05/01/19 0948 05/02/19 0457  WBC 7.7 6.7  NEUTROABS 4.1  --   HGB 13.1 10.2*  HCT 37.9 29.8*  MCV 104.4* 106.8*  PLT 134* 83*    Cardiac Enzymes: No results for input(s): CKTOTAL, CKMB, CKMBINDEX, TROPONINI in the last 168 hours.  BNP: Invalid input(s): POCBNP  CBG: No results for input(s): GLUCAP in the last 168 hours.  Microbiology: Results for orders placed or performed during the hospital encounter of 05/01/19  SARS CORONAVIRUS 2 (TAT 6-24 HRS) Nasopharyngeal Nasopharyngeal Swab     Status: None   Collection Time: 05/01/19  6:46 PM   Specimen: Nasopharyngeal Swab  Result Value Ref Range Status   SARS Coronavirus 2 NEGATIVE NEGATIVE Final    Comment: (NOTE) SARS-CoV-2 target nucleic acids are NOT DETECTED. The SARS-CoV-2 RNA is generally detectable in upper and lower respiratory specimens during the acute phase of infection. Negative results do not preclude SARS-CoV-2 infection, do not rule out co-infections with other pathogens, and should not be used as the sole basis for treatment or other patient management decisions. Negative results must be combined with clinical observations, patient history, and epidemiological information. The expected result is Negative. Fact Sheet for Patients: SugarRoll.be Fact Sheet for Healthcare Providers: https://www.woods-mathews.com/ This test is not yet approved or cleared by the [place] FDA and  has been authorized for detection and/or diagnosis of SARS-CoV-2 by FDA under an Emergency Use Authorization (EUA). This EUA will remain  in effect (meaning this test can be used) for the duration of the COVID-19 declaration under Section 56 4(b)(1) of the Act, 21 U.S.C. section 360bbb-3(b)(1), unless the authorization is terminated  or revoked sooner. Performed at [facility], [place] [address], [place], [place] [address]     Coagulation Studies: No results for input(s): LABPROT, INR in the last 72 hours.  Urinalysis:  Recent Labs  Lab 05/01/19 1508  COLORURINE AMBER*  LABSPEC 1.041*  PHURINE 7.0  GLUCOSEU NEGATIVE  HGBUR SMALL*  BILIRUBINUR NEGATIVE  KETONESUR 5*  PROTEINUR NEGATIVE  NITRITE NEGATIVE  LEUKOCYTESUR NEGATIVE    Lipid Panel:     Component Value Date/Time   CHOL 161 03/18/2019 1132   TRIG 83 03/18/2019 1132   HDL 68 03/18/2019 1132   CHOLHDL 2.1 06/12/2018 1631   VLDL 9 06/12/2018 1631   LDLCALC 77 03/18/2019 1132    HgbA1C:  Lab Results  Component Value Date   HGBA1C 5.6 06/12/2018    Urine  Drug Screen:      Component Value Date/Time   LABOPIA NONE DETECTED 05/01/2019 1508   COCAINSCRNUR NONE DETECTED 05/01/2019 1508   LABBENZ NONE DETECTED 05/01/2019 1508   AMPHETMU NONE DETECTED 05/01/2019 1508   THCU POSITIVE (A) 05/01/2019 1508   LABBARB NONE DETECTED 05/01/2019 1508    Alcohol Level:  Recent Labs  Lab 05/01/19 0948  ETH <10    Other results: EKG: sinus rhythm at 77 bpm.  Imaging: CT Head Wo Contrast  Result Date: 05/01/2019 CLINICAL DATA:  Acute headache. EXAM: CT HEAD WITHOUT CONTRAST TECHNIQUE: Contiguous axial images were obtained from the base of the skull through the vertex without intravenous contrast. COMPARISON:  CT scan dated 06/12/2018 FINDINGS: Brain: No evidence of acute infarction, hemorrhage, hydrocephalus, extra-axial collection or mass lesion/mass effect. Diffuse mild atrophy with secondary ventricular dilatation. Vascular: No hyperdense vessel or unexpected calcification. Skull: Normal. Negative for fracture or focal lesion. Sinuses/Orbits: Normal Other: None IMPRESSION: No acute abnormalities. Diffuse mild atrophy. Electronically Signed   By: Lorriane Shire M.D.   On: 05/01/2019 15:14   CT ABDOMEN PELVIS W CONTRAST  Result Date:  05/01/2019 CLINICAL DATA:  Nausea and vomiting. EXAM: CT ABDOMEN AND PELVIS WITH CONTRAST TECHNIQUE: Multidetector CT imaging of the abdomen and pelvis was performed using the standard protocol following bolus administration of intravenous contrast. CONTRAST:  42mL OMNIPAQUE IOHEXOL 300 MG/ML  SOLN COMPARISON:  December 30, 2017 FINDINGS: Lower chest: The lung bases are clear. The heart size is normal. Hepatobiliary: The liver is normal. Normal gallbladder.There is no biliary ductal dilation. Pancreas: Normal contours without ductal dilatation. No peripancreatic fluid collection. Spleen: No splenic laceration or hematoma. Adrenals/Urinary Tract: --Adrenal glands: No adrenal hemorrhage. --Right kidney/ureter: No hydronephrosis or perinephric hematoma. --Left kidney/ureter: No hydronephrosis or perinephric hematoma. --Urinary bladder: Unremarkable. Stomach/Bowel: --Stomach/Duodenum: No hiatal hernia or other gastric abnormality. Normal duodenal course and caliber. --Small bowel: No dilatation or inflammation. --Colon: There is scattered colonic diverticula without CT evidence for diverticulitis. --Appendix: Not visualized. No right lower quadrant inflammation or free fluid. Vascular/Lymphatic: Atherosclerotic calcification is present within the non-aneurysmal abdominal aorta, without hemodynamically significant stenosis. --No retroperitoneal lymphadenopathy. --No mesenteric lymphadenopathy. --No pelvic or inguinal lymphadenopathy. Reproductive: Unremarkable Other: No ascites or free air. The abdominal wall is normal. Musculoskeletal. No acute displaced fractures. IMPRESSION: 1. No CT evidence of acute intra-abdominal or intrapelvic injury. 2. Scattered colonic diverticula without diverticulitis. Aortic Atherosclerosis (ICD10-I70.0). Electronically Signed   By: Constance Holster M.D.   On: 05/01/2019 15:18     Assessment/Plan: 64 y.o. female with medical history significant of COPD, hypertension, seizure  disorder, migraines, hyperlipidemia, GERD, osteoporosis, anxiety depression, CAD, insomnia and agoraphobia who presented to the ED with intractable nausea and vomiting.  Due to her vomiting she has been unable to keep her antiepileptic medication down.  Currently on Keppra IV at 500mg  BID.  It seems patient was on Keppra in the past and had good seizure control.  Unclear why she was switched to Depakote but may have been due to possible benefits she could obtain for her headaches and depression.  Patient now being switched to Lamictal.  No IV form available.    Recommendations: 1. Would continue Keppra at 500mg  BID IV until able to take po and at that time would switch to 500mg  po BID 2. Once able to take po would reinitiate institution of Lamictal as advised by her outpatient physician. 3. Patient to return to outpatient physician at discharge.   4. Seizure precautions  No  further neurologic intervention is recommended at this time.  If further questions arise, please call or page at that time.  Thank you for allowing neurology to participate in the care of this patient.   Alexis Goodell, MD Neurology [phone] 05/02/2019, 11:17 AM

## 2019-05-02 NOTE — Care Management Obs Status (Signed)
[place] NOTIFICATION   Patient Details  Name: Kayla Shah MRN: [id number] Date of Birth: July 15, 1955   Medicare Observation Status Notification Given:  Yes(Patient was having difficulty signing on the tablet so husband signed for her.)    Candie Chroman, LCSW 05/02/2019, 4:49 PM

## 2019-05-02 NOTE — Progress Notes (Signed)
I reviewed note and agree with plan.   Penni Bombard, MD [id number], [id number] PM Certified in Neurology, Neurophysiology and Neuroimaging  [facility] [address] [place], [place] [address] [phone]

## 2019-05-02 NOTE — Progress Notes (Signed)
Swallowed 2 Tylenol and then spit one Tylenol tablet out, but no emesis. Requesting Zofran and given IV. Tremors of arms and legs, states "Oh I have been doing this. It is the depakote."

## 2019-05-03 LAB — CBC
HCT: 26.9 % — ABNORMAL LOW (ref 36.0–46.0)
Hemoglobin: 9.4 g/dL — ABNORMAL LOW (ref 12.0–15.0)
MCH: 37.2 pg — ABNORMAL HIGH (ref 26.0–34.0)
MCHC: 34.9 g/dL (ref 30.0–36.0)
MCV: 106.3 fL — ABNORMAL HIGH (ref 80.0–100.0)
Platelets: 66 10*3/uL — ABNORMAL LOW (ref 150–400)
RBC: 2.53 MIL/uL — ABNORMAL LOW (ref 3.87–5.11)
RDW: 14.4 % (ref 11.5–15.5)
WBC: 5.2 10*3/uL (ref 4.0–10.5)
nRBC: 0 % (ref 0.0–0.2)

## 2019-05-03 LAB — COMPREHENSIVE METABOLIC PANEL
ALT: 40 U/L (ref 0–44)
AST: 50 U/L — ABNORMAL HIGH (ref 15–41)
Albumin: 2.5 g/dL — ABNORMAL LOW (ref 3.5–5.0)
Alkaline Phosphatase: 26 U/L — ABNORMAL LOW (ref 38–126)
Anion gap: 6 (ref 5–15)
BUN: 10 mg/dL (ref 8–23)
CO2: 27 mmol/L (ref 22–32)
Calcium: 8.1 mg/dL — ABNORMAL LOW (ref 8.9–10.3)
Chloride: 105 mmol/L (ref 98–111)
Creatinine, Ser: 0.89 mg/dL (ref 0.44–1.00)
GFR calc Af Amer: >60 mL/min (ref >60)
GFR calc non Af Amer: >60 mL/min (ref >60)
Glucose, Bld: 82 mg/dL (ref 70–99)
Potassium: 3.5 mmol/L (ref 3.5–5.1)
Sodium: 138 mmol/L (ref 135–145)
Total Bilirubin: 0.7 mg/dL (ref 0.3–1.2)
Total Protein: 4.6 g/dL — ABNORMAL LOW (ref 6.5–8.1)

## 2019-05-03 LAB — LAMOTRIGINE LEVEL: Lamotrigine Lvl: 6.8 ug/mL (ref 2.0–20.0)

## 2019-05-03 LAB — MAGNESIUM: Magnesium: 1.6 mg/dL — ABNORMAL LOW (ref 1.7–2.4)

## 2019-05-03 MED ORDER — ENSURE ENLIVE PO LIQD
237.0000 mL | Freq: Two times a day (BID) | ORAL | Status: DC
  Administered 2019-05-03 (×2): 237 mL via ORAL

## 2019-05-03 MED ORDER — LEVETIRACETAM 500 MG PO TABS: 500.0000 mg | ORAL_TABLET | Freq: Two times a day (BID) | ORAL | Status: DC

## 2019-05-03 MED ORDER — PANTOPRAZOLE SODIUM 40 MG PO TBEC
40.0000 mg | DELAYED_RELEASE_TABLET | Freq: Two times a day (BID) | ORAL | Status: DC
  Administered 2019-05-03: 40 mg via ORAL
  Filled 2019-05-03: qty 1

## 2019-05-03 MED ORDER — MEGESTROL ACETATE 20 MG PO TABS
20.0000 mg | ORAL_TABLET | Freq: Every day | ORAL | Status: DC
  Administered 2019-05-03: 20 mg via ORAL
  Filled 2019-05-03: qty 1

## 2019-05-03 MED ORDER — LEVETIRACETAM 500 MG PO TABS: 500.0000 mg | ORAL_TABLET | Freq: Two times a day (BID) | ORAL | 1 refills | Status: DC

## 2019-05-03 MED ORDER — GABAPENTIN 300 MG PO CAPS
300.0000 mg | ORAL_CAPSULE | Freq: Two times a day (BID) | ORAL | Status: DC
  Administered 2019-05-03: 300 mg via ORAL
  Filled 2019-05-03: qty 1

## 2019-05-03 MED ORDER — TRAZODONE HCL 50 MG PO TABS: 50.0000 mg | ORAL_TABLET | Freq: Every evening | ORAL | 0 refills | Status: DC | PRN

## 2019-05-03 NOTE — TOC Transition Note (Signed)
Transition of Care [facility]) - CM/SW Discharge Note   Patient Details  Name: Kayla Shah MRN: [id number] Date of Birth: 1955/08/03  [facility] CM/SW Contact:  Candie Chroman, LCSW Phone Number: 05/03/2019, 2:34 PM   Clinical Narrative: Patient has orders to discharge home today. Left [facility] representative a message to let him know. No further concerns. CSW signing off.    Final next level of care: Home w Home Health Services Barriers to Discharge: Barriers Resolved   Patient Goals and CMS Choice   CMS Medicare.gov Compare Post Acute Care list provided to:: Patient(Husband at bedside.) Choice offered to / list presented to : Patient, Spouse  Discharge Placement                    Patient and family notified of of transfer: 05/03/19  Discharge Plan and Services     Post Acute Care Choice: [place]: PT [place]: [place] Date [place]: 05/02/19   Representative spoke with at [place]: Adela Lank  Social Determinants of Health (SDOH) Interventions     Readmission Risk Interventions No flowsheet data found.

## 2019-05-03 NOTE — Discharge Summary (Signed)
Physician Discharge Summary  Kayla Shah [facility] [id number] DOB: 01/20/1956 DOA: 05/01/2019  PCP: Volney American, PA-C  Admit date: 05/01/2019 Discharge date: 05/03/2019  Admitted From: home Disposition:  home  Recommendations for Outpatient Follow-up:  1. Follow up with PCP in 1-2 weeks 2. Please obtain BMP/CBC in one week 3. Please follow up with neurology.  Depakote was discontinued.  Discharged on [place] without Lamictal.    Home Health: no   Equipment/Devices: none   Discharge Condition: stable  CODE STATUS: full Diet recommendation: soft/bland, advance as tolerated   Brief/Interim Summary:  [place]la [place] 64 y.o.femalewith medical history significant ofCOPD, hypertension, seizure disorder, migraines, hyperlipidemia, GERD, osteoporosis, anxiety depression, CAD, insomnia and agoraphobia who presented to the ED today with intractable nausea and vomiting.   In the ED, labs were essentially normal except for hypokalemia.  Patient has been unable to take her medications, including antiepileptics for several days.  Admitted for supportive care and IV antiepileptic medication.  Intractable nausea and vomiting-unable to tolerate any p.o. intake for the past 4 to 5 days, including medications. CT abdomen pelvis was negative. Lipase was normal. LFTs mostly normal other than AST of 63. Patient afebrile and without leukocytosis to suggest infection. Clear liquid diet,advanced and tolerated.  Antiemetics.  IV Protonix.  Monitor vitals and electrolytes.  Hypokalemia -K3.2 on admission,replaced  Elevated troponin-initial troponin I 59, repeat 1.3. Patient without chest pain. EKG does show some inferior and lateral lead ST depressions. On-call cardiology spoke with ED physician and indicated no need for emergent work-up at this time, recommended outpatient evaluation in the near future.  Troponins trended down and patient remained chest pain  free.  Seizures-patient has been on Depakote for quite some time but due to side effects her neurologist is transitioning her to Lamictal. She is been able to keep down p.o. meds. Her Depakote level was low on admission. --IV Keppra, transitioned to PO on discharge until follow up outpatient regarding starting Lamictal.  Patient's tremor improving with being off Depakote.  Home p.o. meds for other chronic comorbidities were held until patient tolerated PO, then resumed.    Discharge Diagnoses: Principal Problem:   Intractable nausea and vomiting Active Problems:   Elevated troponin   Hypokalemia   Seizures (HCC)   CAD (coronary artery disease)    Discharge Instructions   Discharge Instructions    Call MD for:  extreme fatigue   Complete by: As directed    Call MD for:  persistant dizziness or light-headedness   Complete by: As directed    Call MD for:  persistant nausea and vomiting   Complete by: As directed    Call MD for:  persistant nausea and vomiting   Complete by: As directed    Call MD for:  temperature >100.4   Complete by: As directed    Call MD for:  temperature >100.4   Complete by: As directed    Discharge instructions   Complete by: As directed    Take Keppra 500 mg twice daily for seizure prevention.   Please follow up with your neurologist about starting the Lamictal.  Resume eating food slowly, as tolerated.  Make sure you're drinking plenty of fluids.   Add Gatorade (or similar) or PediaLyte if you're still not tolerating food well.  These have electrolytes to keep your potassium from dropping low.   Discharge instructions   Complete by: As directed    Take Keppra (aka levetiracetam) twice daily to prevent seizures.  Contact your neurologist regarding if and when to start on Lamictal as they had been planning.  You can use trazadone as needed for sleep.  Please contact PCP if refills are needed.   Increase activity slowly   Complete by: As  directed    Increase activity slowly   Complete by: As directed      Allergies as of 05/03/2019      Reactions   Paroxetine Hcl Other (See Comments)   Pt. Becomes very aggressive with mood swings.   Codeine Nausea And Vomiting   Fluoxetine Anxiety   Paroxetine Anxiety      Medication List    STOP taking these medications   divalproex 250 MG DR tablet Commonly known as: DEPAKOTE     TAKE these medications   CALCIUM 500 PO Take 1 tablet by mouth daily.   cholecalciferol 1000 units tablet Commonly known as: VITAMIN D Take 1,000 Units by mouth daily.   gabapentin 300 MG capsule Commonly known as: NEURONTIN TAKE ONE CAPSULE BY MOUTH TWICE A DAY   ibuprofen 200 MG tablet Commonly known as: ADVIL Take 200-600 mg by mouth every 6 (six) hours as needed for fever or mild pain.   lamoTRIgine 25 MG tablet Commonly known as: LAMICTAL Take 4 tablets (100 mg total) by mouth 2 (two) times daily.   levETIRAcetam 500 MG tablet Commonly known as: KEPPRA Take 1 tablet (500 mg total) by mouth 2 (two) times daily.   megestrol 20 MG tablet Commonly known as: MEGACE Take 1 tablet (20 mg total) by mouth daily.   ondansetron 8 MG disintegrating tablet Commonly known as: ZOFRAN-ODT TAKE 1 TABLET BY MOUTH EVERY 8 HOURS AS NEEDED FOR NAUSEA OR VOMITING What changed: See the new instructions.   pantoprazole 40 MG tablet Commonly known as: PROTONIX TAKE 1 TABLET BY MOUTH TWICE A DAY   simvastatin 20 MG tablet Commonly known as: ZOCOR TAKE 1 TABLET BY MOUTH DAILY   traZODone 50 MG tablet Commonly known as: DESYREL Take 1 tablet (50 mg total) by mouth at bedtime as needed for sleep.   verapamil 240 MG CR tablet Commonly known as: CALAN-SR TAKE 1 TABLET BY MOUTH DAILY      Follow-up Information    Isaias Cowman, MD.   Specialty: Cardiology Contact information: [facility] [place] [address] [phone]        [facility]  [facility] Follow up.   Specialty: Home Health Services Why: They will follow up with you for your home health physical therapy needs. Contact information: [place] [address] [phone]          Allergies  Allergen Reactions  . Paroxetine Hcl Other (See Comments)    Pt. Becomes very aggressive with mood swings.  . Codeine Nausea And Vomiting  . Fluoxetine Anxiety  . Paroxetine Anxiety    Consultations:  Neurology   Procedures/Studies: CT Head Wo Contrast  Result Date: 05/01/2019 CLINICAL DATA:  Acute headache. EXAM: CT HEAD WITHOUT CONTRAST TECHNIQUE: Contiguous axial images were obtained from the base of the skull through the vertex without intravenous contrast. COMPARISON:  CT scan dated 06/12/2018 FINDINGS: Brain: No evidence of acute infarction, hemorrhage, hydrocephalus, extra-axial collection or mass lesion/mass effect. Diffuse mild atrophy with secondary ventricular dilatation. Vascular: No hyperdense vessel or unexpected calcification. Skull: Normal. Negative for fracture or focal lesion. Sinuses/Orbits: Normal Other: None IMPRESSION: No acute abnormalities. Diffuse mild atrophy. Electronically Signed   By: Jeneen Rinks  Maxwell M.D.   On: 05/01/2019 15:14   CT ABDOMEN PELVIS W CONTRAST  Result Date: 05/01/2019 CLINICAL DATA:  Nausea and vomiting. EXAM: CT ABDOMEN AND PELVIS WITH CONTRAST TECHNIQUE: Multidetector CT imaging of the abdomen and pelvis was performed using the standard protocol following bolus administration of intravenous contrast. CONTRAST:  60mL OMNIPAQUE IOHEXOL 300 MG/ML  SOLN COMPARISON:  December 30, 2017 FINDINGS: Lower chest: The lung bases are clear. The heart size is normal. Hepatobiliary: The liver is normal. Normal gallbladder.There is no biliary ductal dilation. Pancreas: Normal contours without ductal dilatation. No peripancreatic fluid collection. Spleen: No splenic laceration or hematoma. Adrenals/Urinary Tract:  --Adrenal glands: No adrenal hemorrhage. --Right kidney/ureter: No hydronephrosis or perinephric hematoma. --Left kidney/ureter: No hydronephrosis or perinephric hematoma. --Urinary bladder: Unremarkable. Stomach/Bowel: --Stomach/Duodenum: No hiatal hernia or other gastric abnormality. Normal duodenal course and caliber. --Small bowel: No dilatation or inflammation. --Colon: There is scattered colonic diverticula without CT evidence for diverticulitis. --Appendix: Not visualized. No right lower quadrant inflammation or free fluid. Vascular/Lymphatic: Atherosclerotic calcification is present within the non-aneurysmal abdominal aorta, without hemodynamically significant stenosis. --No retroperitoneal lymphadenopathy. --No mesenteric lymphadenopathy. --No pelvic or inguinal lymphadenopathy. Reproductive: Unremarkable Other: No ascites or free air. The abdominal wall is normal. Musculoskeletal. No acute displaced fractures. IMPRESSION: 1. No CT evidence of acute intra-abdominal or intrapelvic injury. 2. Scattered colonic diverticula without diverticulitis. Aortic Atherosclerosis (ICD10-I70.0). Electronically Signed   By: Constance Holster M.D.   On: 05/01/2019 15:18       Subjective: Patient states feeling better.  Tolerated oral medication.  Slowly advancing diet and agreeable with returning home.  No fever, chills or other new complaints.   Discharge Exam: Vitals:   05/02/19 2115 05/03/19 0629  BP: 120/66 128/70  Pulse: (!) 57 (!) 52  Resp: 18 18  Temp: 99.1 F (37.3 C) 98.5 F (36.9 C)  SpO2: 99% 99%   Vitals:   05/02/19 0615 05/02/19 1426 05/02/19 2115 05/03/19 0629  BP:  104/68 120/66 128/70  Pulse:  68 (!) 57 (!) 52  Resp: 16 15 18 18   Temp:  98.6 F (37 C) 99.1 F (37.3 C) 98.5 F (36.9 C)  TempSrc:  Oral Oral Oral  SpO2:  100% 99% 99%  Weight:      Height:        General: Pt is alert, awake, not in acute distress Cardiovascular: RRR, S1/S2 +, no rubs, no gallops Respiratory:  CTA bilaterally, no wheezing, no rhonchi Abdominal: Soft, NT, ND, bowel sounds + Extremities: no edema, no cyanosis    The results of significant diagnostics from this hospitalization (including imaging, microbiology, ancillary and laboratory) are listed below for reference.     Microbiology: Recent Results (from the past 240 hour(s))  SARS CORONAVIRUS 2 (TAT 6-24 HRS) Nasopharyngeal Nasopharyngeal Swab     Status: None   Collection Time: 05/01/19  6:46 PM   Specimen: Nasopharyngeal Swab  Result Value Ref Range Status   SARS Coronavirus 2 NEGATIVE NEGATIVE Final    Comment: (NOTE) SARS-CoV-2 target nucleic acids are NOT DETECTED. The SARS-CoV-2 RNA is generally detectable in upper and lower respiratory specimens during the acute phase of infection. Negative results do not preclude SARS-CoV-2 infection, do not rule out co-infections with other pathogens, and should not be used as the sole basis for treatment or other patient management decisions. Negative results must be combined with clinical observations, patient history, and epidemiological information. The expected result is Negative. Fact Sheet for Patients: SugarRoll.be Fact Sheet for Healthcare Providers:  https://www.woods-mathews.com/ This test is not yet approved or cleared by the [place] and  has been authorized for detection and/or diagnosis of SARS-CoV-2 by FDA under an Emergency Use Authorization (EUA). This EUA will remain  in effect (meaning this test can be used) for the duration of the COVID-19 declaration under Section 56 4(b)(1) of the Act, 21 U.S.C. section 360bbb-3(b)(1), unless the authorization is terminated or revoked sooner. Performed at [facility], [place] [address] [place], [place] [address]      Labs: BNP (last 3 results) No results for input(s): BNP in the last 8760 hours. Basic Metabolic Panel: Recent Labs  Lab 05/01/19 0948  05/02/19 0457 05/03/19 0630  NA 138 140 138  K 3.2* 3.7 3.5  CL 99 107 105  CO2 26 26 27   GLUCOSE 112* 79 82  BUN 21 18 10   CREATININE 1.03* 0.89 0.89  CALCIUM 9.4 8.2* 8.1*  MG 1.8  --  1.6*   Liver Function Tests: Recent Labs  Lab 05/01/19 0948 05/02/19 0457 05/03/19 0630  AST 63* 81* 50*  ALT 41 48* 40  ALKPHOS 40 29* 26*  BILITOT 1.2 0.9 0.7  PROT 7.0 5.0* 4.6*  ALBUMIN 3.8 2.6* 2.5*   Recent Labs  Lab 05/01/19 0948  LIPASE 25   No results for input(s): AMMONIA in the last 168 hours. CBC: Recent Labs  Lab 05/01/19 0948 05/02/19 0457 05/03/19 0630  WBC 7.7 6.7 5.2  NEUTROABS 4.1  --   --   HGB 13.1 10.2* 9.4*  HCT 37.9 29.8* 26.9*  MCV 104.4* 106.8* 106.3*  PLT 134* 83* 66*   Cardiac Enzymes: No results for input(s): CKTOTAL, CKMB, CKMBINDEX, TROPONINI in the last 168 hours. BNP: Invalid input(s): POCBNP CBG: No results for input(s): GLUCAP in the last 168 hours. D-Dimer No results for input(s): DDIMER in the last 72 hours. Hgb A1c No results for input(s): HGBA1C in the last 72 hours. Lipid Profile No results for input(s): CHOL, HDL, LDLCALC, TRIG, CHOLHDL, LDLDIRECT in the last 72 hours. Thyroid function studies No results for input(s): TSH, T4TOTAL, T3FREE, THYROIDAB in the last 72 hours.  Invalid input(s): FREET3 Anemia work up No results for input(s): VITAMINB12, FOLATE, FERRITIN, TIBC, IRON, RETICCTPCT in the last 72 hours. Urinalysis    Component Value Date/Time   COLORURINE AMBER (A) 05/01/2019 1508   APPEARANCEUR HAZY (A) 05/01/2019 1508   APPEARANCEUR Hazy (A) 09/08/2018 1352   LABSPEC 1.041 (H) 05/01/2019 1508   LABSPEC 1.011 07/01/2012 1751   PHURINE 7.0 05/01/2019 1508   GLUCOSEU NEGATIVE 05/01/2019 1508   GLUCOSEU Negative 07/01/2012 1751   HGBUR SMALL (A) 05/01/2019 1508   BILIRUBINUR NEGATIVE 05/01/2019 1508   BILIRUBINUR Negative 09/08/2018 1352   BILIRUBINUR Negative 07/01/2012 1751   KETONESUR 5 (A) 05/01/2019 1508    PROTEINUR NEGATIVE 05/01/2019 1508   NITRITE NEGATIVE 05/01/2019 1508   LEUKOCYTESUR NEGATIVE 05/01/2019 1508   LEUKOCYTESUR Negative 07/01/2012 1751   Sepsis Labs Invalid input(s): PROCALCITONIN,  WBC,  LACTICIDVEN Microbiology Recent Results (from the past 240 hour(s))  SARS CORONAVIRUS 2 (TAT 6-24 HRS) Nasopharyngeal Nasopharyngeal Swab     Status: None   Collection Time: 05/01/19  6:46 PM   Specimen: Nasopharyngeal Swab  Result Value Ref Range Status   SARS Coronavirus 2 NEGATIVE NEGATIVE Final    Comment: (NOTE) SARS-CoV-2 target nucleic acids are NOT DETECTED. The SARS-CoV-2 RNA is generally detectable in upper and lower respiratory specimens during the acute phase of infection. Negative results do  not preclude SARS-CoV-2 infection, do not rule out co-infections with other pathogens, and should not be used as the sole basis for treatment or other patient management decisions. Negative results must be combined with clinical observations, patient history, and epidemiological information. The expected result is Negative. Fact Sheet for Patients: SugarRoll.be Fact Sheet for Healthcare Providers: https://www.woods-mathews.com/ This test is not yet approved or cleared by the [place] FDA and  has been authorized for detection and/or diagnosis of SARS-CoV-2 by FDA under an Emergency Use Authorization (EUA). This EUA will remain  in effect (meaning this test can be used) for the duration of the COVID-19 declaration under Section 56 4(b)(1) of the Act, 21 U.S.C. section 360bbb-3(b)(1), unless the authorization is terminated or revoked sooner. Performed at [facility], [place] [address], [place], [place] [address]      Time coordinating discharge: Over 30 minutes  SIGNED:   Ezekiel Slocumb, DO [facility] 05/03/2019, 2:12 PM   If 7PM-7AM, please contact night-coverage [email or web address]

## 2019-05-04 ENCOUNTER — Telehealth: Payer: Self-pay

## 2019-05-04 NOTE — Telephone Encounter (Signed)
.  Transition Care Management Follow-up Telephone Call  Date of discharge and from where: 05/03/2019 [facility]   How have you been since you were released from the hospital? "Better"  Any questions or concerns? No   Items Reviewed:  Did the pt receive and understand the discharge instructions provided? Yes   Medications obtained and verified? Yes   Any new allergies since your discharge? No   Dietary orders reviewed? Yes  Do you have support at home? Yes   Functional Questionnaire: (I = Independent and D = Dependent) ADLs: i  Bathing/Dressing- i  Meal Prep- i  Eating- i  Maintaining continence- i  Transferring/Ambulation- i  Managing Meds- i  Follow up appointments reviewed:   PCP Hospital f/u appt confirmed? Yes  Scheduled to see Kayla Shah on 05/09/2019 @ 230pm.  [facility] f/u appt confirmed? yes  Are transportation arrangements needed? No   If their condition worsens, is the pt aware to call PCP or go to the Emergency Dept.? Yes  Was the patient provided with contact information for the PCP's office or ED? Yes  Was to pt encouraged to call back with questions or concerns? Yes

## 2019-05-06 ENCOUNTER — Telehealth: Payer: Self-pay | Admitting: Family Medicine

## 2019-05-06 NOTE — Telephone Encounter (Signed)
Copied from [place] [id number]. Topic: Quick Communication - Home Health Verbal Orders >> May 06, 2019  2:36 PM Yvette Rack wrote: Caller/Agency: Skeet Simmer with [facility] Number: [phone] Requesting OT/PT/Skilled Nursing/Social Work/Speech Therapy: PT Frequency: 1 time a week for 1 week,  2 times a week for 2 weeks, and 1 time a week for 2 weeks

## 2019-05-09 ENCOUNTER — Inpatient Hospital Stay: Payer: Medicare Other | Admitting: Family Medicine

## 2019-05-09 ENCOUNTER — Telehealth: Payer: Self-pay | Admitting: Family Medicine

## 2019-05-09 NOTE — Telephone Encounter (Signed)
Kayla Shah Pt with [name] returned call to [name]. He is awaiting a call back Ph# [phone]

## 2019-05-09 NOTE — Telephone Encounter (Signed)
Will discuss at today's appt

## 2019-05-09 NOTE — Telephone Encounter (Signed)
Called [facility] back and gave verbal orders per Apolonio Schneiders.

## 2019-05-09 NOTE — Telephone Encounter (Signed)
Called Kayla Shah with [facility] and LVM for him to return call to the office.

## 2019-05-09 NOTE — Telephone Encounter (Signed)
Pt asked if she can get a call back or something for her Migraines / Please advise

## 2019-05-09 NOTE — Telephone Encounter (Signed)
Ok to give verbal 

## 2019-05-09 NOTE — Telephone Encounter (Signed)
Caller/Agency: Skeet Simmer with [facility] Number: [phone] Calling about note left Friday 4/2 states only requiring P/T with same amount of time/days calling in to verify that  verapamil (CALAN-SR) 240 MG CR tablet and simvastatin (ZOCOR) 20 MG tablet  These have Level One reaction and is dr okay fo go ahead on this?  Please FU from Friday with additional Lev-1 question

## 2019-05-12 ENCOUNTER — Telehealth: Payer: Self-pay | Admitting: Family Medicine

## 2019-05-12 MED ORDER — RIZATRIPTAN BENZOATE 10 MG PO TBDP: 10.0000 mg | ORAL_TABLET | ORAL | 11 refills | Status: DC | PRN

## 2019-05-12 NOTE — Telephone Encounter (Signed)
Is she requesting a refill of something she's already on or a new medication? If new medication this will need to wait until follow up. Can move appt sooner if needed sooner than that

## 2019-05-12 NOTE — Telephone Encounter (Signed)
Gabapentin can help if she has not already tried that. We can complete PA for Amovig. I do not see that she is on a triptan? We can try rizatriptan 10mg  if she wishes. Lots of water and rest will also help. If any allergy symptoms consider antihistamine like Zyrtec. Benadryl can also help abort a headache.

## 2019-05-12 NOTE — Telephone Encounter (Signed)
Tried calling patient back, no answer. LVM for patient to return phone call.

## 2019-05-12 NOTE — Telephone Encounter (Signed)
Please advise 

## 2019-05-12 NOTE — Telephone Encounter (Signed)
Pt called to inform she does not want to try new medication states she has heard bad things about medication and would like to wait and see if things get worst

## 2019-05-12 NOTE — Telephone Encounter (Signed)
Pt asked if Kayla Shah can have an Rx for anti depressant called into the pharmacy/ Pt scheduled appt for Tuesday

## 2019-05-12 NOTE — Telephone Encounter (Signed)
I called the pt and she wil try rizatriptan, I think she was feeling bad.  I ffered her sooner appt she will call next week, and will hold off on aimovig right now.

## 2019-05-12 NOTE — Telephone Encounter (Signed)
Patient called wanting to know  What she can take for her migraines. She states there is nothing she can take that helps stop it without going to the ER.

## 2019-05-12 NOTE — Telephone Encounter (Signed)
I called pt , she states that she has had migraine for last week.  She is not taking really anything for them.  This is on her R side of head, and level 9 at this time.  No other sx.  Last seen 04-27-19 for seizures.  Takes gabapenin prn.  No tylenol or motrin.  I called her pharmacy and she has aimovig but needs PA.  (looks like tried topirmate and imitrex) but no rx.  I can make appt for her next week to address but since you just saw her, let me know.

## 2019-05-13 ENCOUNTER — Telehealth: Payer: Self-pay | Admitting: Family Medicine

## 2019-05-13 NOTE — Telephone Encounter (Signed)
Patient notified

## 2019-05-13 NOTE — Telephone Encounter (Signed)
Called and spoke to patient. I advised her to Rachel's message. Patient states that she is ok right now. Patient states she does not have a computer to do a virtual. Explained that we could do a telephone visit as well if patient would like. She states she will call us and let us know next week if she would like to be seen or not.

## 2019-05-13 NOTE — Telephone Encounter (Signed)
Pt called right back and ask if there is anything she can take OTC for her migraines or if Ms. Kayla Shah can prescribe her something.  CB#  [phone]

## 2019-05-13 NOTE — Telephone Encounter (Signed)
Looks like her Neurologist just wrote her maxalt that should help

## 2019-05-13 NOTE — Telephone Encounter (Signed)
Lemuel from [facility] called to report a missed visit today because patient stated that she wasn't feeling well enough. Migraine yesterday  Best contact: [phone]

## 2019-05-13 NOTE — Telephone Encounter (Signed)
Routing to provider  

## 2019-05-13 NOTE — Telephone Encounter (Signed)
Pt asked that the message be forwarded that her nausea is worsening.  Pt is asking to be called by RN on Monday.

## 2019-05-16 NOTE — Telephone Encounter (Signed)
I would suggest she be seen. Could be related to something else like allergies/sinus infection? If we have availability, great, if not have her see if PCP can see her.

## 2019-05-16 NOTE — Telephone Encounter (Signed)
I called pt and relayed that I did not have any sooner appt to offer her , seek acute care at her pcp.  She verbalized understanding.  She has appt 06-21-19 with [place].

## 2019-05-16 NOTE — Telephone Encounter (Signed)
I called pt and she has takne zofran 8mg  and it has not helped much, also, she tried maxalt and this helped some but it has come back again level9, r side of head, nausea.  I dont have appt for you acutely.  Last seen for seizures 05-01-19. Please advise.  I can place with MM/NP she has availability. aimovig is not something she wanted to try due to heard SE.

## 2019-05-17 ENCOUNTER — Inpatient Hospital Stay: Payer: Medicare Other | Admitting: Family Medicine

## 2019-05-18 ENCOUNTER — Telehealth: Payer: Self-pay | Admitting: Family Medicine

## 2019-05-18 NOTE — Telephone Encounter (Signed)
Copied from [place] [phone] [phone]. Topic: General - Other >> May 18, 2019  3:38 PM Keene Breath wrote: Reason for CRM: Called to inform the doctor of a missed visit today.  Patient stated that she is not feeing well enough for therapy today.  If there are any questions, please call at [phone]

## 2019-05-19 NOTE — Telephone Encounter (Signed)
Noted  

## 2019-05-20 ENCOUNTER — Telehealth: Payer: Self-pay | Admitting: Family Medicine

## 2019-05-20 NOTE — Telephone Encounter (Signed)
Please see if she would like to move her appt sooner due to feeling bad or at least make sure she's letting her Neurologist know how severe her migraines are

## 2019-05-20 NOTE — Telephone Encounter (Signed)
Called pt no answer, left vm  

## 2019-05-20 NOTE — Telephone Encounter (Signed)
Noted  

## 2019-05-20 NOTE — Telephone Encounter (Signed)
Kayla Shah PT with [facility] called in to make PCP aware that pt had another missed visit today due to migraine ,headache and nausea.    CB: [phone]

## 2019-05-23 ENCOUNTER — Inpatient Hospital Stay: Payer: Medicare Other | Admitting: Family Medicine

## 2019-05-26 ENCOUNTER — Other Ambulatory Visit: Payer: Self-pay

## 2019-05-26 ENCOUNTER — Encounter: Payer: Self-pay | Admitting: Family Medicine

## 2019-05-26 ENCOUNTER — Inpatient Hospital Stay: Payer: Medicare Other | Admitting: Family Medicine

## 2019-05-26 ENCOUNTER — Ambulatory Visit (INDEPENDENT_AMBULATORY_CARE_PROVIDER_SITE_OTHER): Payer: Medicare Other | Admitting: Family Medicine

## 2019-05-26 VITALS — BP 121/73 | HR 59 | Temp 98.4°F | Wt 107.0 lb

## 2019-05-26 DIAGNOSIS — R112 Nausea with vomiting, unspecified: Secondary | ICD-10-CM | POA: Diagnosis not present

## 2019-05-26 DIAGNOSIS — R569 Unspecified convulsions: Secondary | ICD-10-CM | POA: Diagnosis not present

## 2019-05-26 DIAGNOSIS — R636 Underweight: Secondary | ICD-10-CM

## 2019-05-26 DIAGNOSIS — R778 Other specified abnormalities of plasma proteins: Secondary | ICD-10-CM | POA: Diagnosis not present

## 2019-05-26 DIAGNOSIS — G4700 Insomnia, unspecified: Secondary | ICD-10-CM

## 2019-05-26 DIAGNOSIS — G43009 Migraine without aura, not intractable, without status migrainosus: Secondary | ICD-10-CM

## 2019-05-26 MED ORDER — MEGESTROL ACETATE 20 MG PO TABS: 20.0000 mg | ORAL_TABLET | Freq: Two times a day (BID) | ORAL | 2 refills | Status: DC

## 2019-05-26 NOTE — Progress Notes (Signed)
BP 121/73   Pulse (!) 59   Temp 98.4 F (36.9 C) (Oral)   Wt 107 lb (48.5 kg)   SpO2 97%   BMI 16.76 kg/m    Subjective:    Patient ID: Kayla Shah, female    DOB: 04/24/1955, 64 y.o.   MRN: [id number]  HPI: Kayla Shah is a 64 y.o. female  Chief Complaint  Patient presents with  . Hospitalization Follow-up   Presenting today for hospital follow up for seizures, N/V. Had been on depakote for a long time but is now transitioning to lamictal due to sedation by her new Neurologist. Was given IV keppra and transitioned to PO keppra at discharge until she can f/u with outpatient Neurology to discuss the lamictal.   N/V - CT abdomen pelvis and labs unremarkable for cause, given antiemetics and told to advance diet as tolerated. Has not had an appetite and has not really been eating. Taking megace which helps mildly with appetite only sometimes. Still not eating as much as she knows she should. Does note she has more noticeable hunger on the medication.   Migraines have been severe since discharge. Has maxalt for prn use and taking gabapentin without much relief. Having nearly daily migraines and she states they have been debilitating.   Taking trazodone prn for sleep that was given in hospital but has only taken it once. Does not typically have trouble sleeping so doesn't want to be taking it. It also made her entirely too drowsy.   Noted again to have elevated troponin levels in hospital (had same finding about 2 years ago). Continues to decline desire for further cardiac workup for this. States she does not have CP currently.   Relevant past medical, surgical, family and social history reviewed and updated as indicated. Interim medical history since our last visit reviewed. Allergies and medications reviewed and updated.  Review of Systems  Per HPI unless specifically indicated above     Objective:    BP 121/73   Pulse (!) 59   Temp 98.4 F (36.9 C) (Oral)   Wt  107 lb (48.5 kg)   SpO2 97%   BMI 16.76 kg/m   Wt Readings from Last 3 Encounters:  05/26/19 107 lb (48.5 kg)  05/01/19 104 lb 11.5 oz (47.5 kg)  04/27/19 109 lb 12.8 oz (49.8 kg)    Physical Exam Vitals and nursing note reviewed.  Constitutional:      Comments: Underweight  HENT:     Head: Atraumatic.  Eyes:     Extraocular Movements: Extraocular movements intact.     Conjunctiva/sclera: Conjunctivae normal.  Cardiovascular:     Rate and Rhythm: Normal rate and regular rhythm.     Heart sounds: Normal heart sounds.  Pulmonary:     Effort: Pulmonary effort is normal.     Breath sounds: Normal breath sounds.  Musculoskeletal:        General: Normal range of motion.     Cervical back: Normal range of motion and neck supple.  Skin:    General: Skin is warm and dry.  Neurological:     Mental Status: She is alert. Mental status is at baseline.  Psychiatric:        Mood and Affect: Mood normal.        Thought Content: Thought content normal.        Judgment: Judgment normal.     Results for orders placed or performed during the hospital encounter of 05/01/19  SARS CORONAVIRUS 2 (TAT 6-24 HRS) Nasopharyngeal Nasopharyngeal Swab   Specimen: Nasopharyngeal Swab  Result Value Ref Range   SARS Coronavirus 2 NEGATIVE NEGATIVE  Valproic acid level  Result Value Ref Range   Valproic Acid Lvl 38 (L) 50.0 - 100.0 ug/mL  CBC with Differential  Result Value Ref Range   WBC 7.7 4.0 - 10.5 K/uL   RBC 3.63 (L) 3.87 - 5.11 MIL/uL   Hemoglobin 13.1 12.0 - 15.0 g/dL   HCT 37.9 36.0 - 46.0 %   MCV 104.4 (H) 80.0 - 100.0 fL   MCH 36.1 (H) 26.0 - 34.0 pg   MCHC 34.6 30.0 - 36.0 g/dL   RDW 14.6 11.5 - 15.5 %   Platelets 134 (L) 150 - 400 K/uL   nRBC 0.0 0.0 - 0.2 %   Neutrophils Relative % 53 %   Neutro Abs 4.1 1.7 - 7.7 K/uL   Lymphocytes Relative 29 %   Lymphs Abs 2.3 0.7 - 4.0 K/uL   Monocytes Relative 16 %   Monocytes Absolute 1.2 (H) 0.1 - 1.0 K/uL   Eosinophils Relative 2 %     Eosinophils Absolute 0.2 0.0 - 0.5 K/uL   Basophils Relative 0 %   Basophils Absolute 0.0 0.0 - 0.1 K/uL   Immature Granulocytes 0 %   Abs Immature Granulocytes 0.03 0.00 - 0.07 K/uL  Magnesium  Result Value Ref Range   Magnesium 1.8 1.7 - 2.4 mg/dL  Comprehensive metabolic panel  Result Value Ref Range   Sodium 138 135 - 145 mmol/L   Potassium 3.2 (L) 3.5 - 5.1 mmol/L   Chloride 99 98 - 111 mmol/L   CO2 26 22 - 32 mmol/L   Glucose, Bld 112 (H) 70 - 99 mg/dL   BUN 21 8 - 23 mg/dL   Creatinine, Ser 1.03 (H) 0.44 - 1.00 mg/dL   Calcium 9.4 8.9 - 10.3 mg/dL   Total Protein 7.0 6.5 - 8.1 g/dL   Albumin 3.8 3.5 - 5.0 g/dL   AST 63 (H) 15 - 41 U/L   ALT 41 0 - 44 U/L   Alkaline Phosphatase 40 38 - 126 U/L   Total Bilirubin 1.2 0.3 - 1.2 mg/dL   GFR calc non Af Amer 58 (L) >60 mL/min   GFR calc Af Amer >60 >60 mL/min   Anion gap 13 5 - 15  Lipase, blood  Result Value Ref Range   Lipase 25 11 - 51 U/L  Urinalysis, Routine w reflex microscopic  Result Value Ref Range   Color, Urine AMBER (A) YELLOW   APPearance HAZY (A) CLEAR   Specific Gravity, Urine 1.041 (H) 1.005 - 1.030   pH 7.0 5.0 - 8.0   Glucose, UA NEGATIVE NEGATIVE mg/dL   Hgb urine dipstick SMALL (A) NEGATIVE   Bilirubin Urine NEGATIVE NEGATIVE   Ketones, ur 5 (A) NEGATIVE mg/dL   Protein, ur NEGATIVE NEGATIVE mg/dL   Nitrite NEGATIVE NEGATIVE   Leukocytes,Ua NEGATIVE NEGATIVE   RBC / HPF 21-50 0 - 5 RBC/hpf   WBC, UA 6-10 0 - 5 WBC/hpf   Bacteria, UA NONE SEEN NONE SEEN   Squamous Epithelial / LPF NONE SEEN 0 - 5   Mucus PRESENT   Urine Drug Screen, Qualitative ([facility] only)  Result Value Ref Range   Tricyclic, Ur Screen NONE DETECTED NONE DETECTED   Amphetamines, Ur Screen NONE DETECTED NONE DETECTED   MDMA (Ecstasy)Ur Screen NONE DETECTED NONE DETECTED   Cocaine Metabolite,Ur [place] NONE DETECTED NONE  DETECTED   Opiate, Ur Screen NONE DETECTED NONE DETECTED   Phencyclidine (PCP) Ur S NONE DETECTED NONE  DETECTED   Cannabinoid 50 Ng, Ur [place] POSITIVE (A) NONE DETECTED   Barbiturates, Ur Screen NONE DETECTED NONE DETECTED   Benzodiazepine, Ur Scrn NONE DETECTED NONE DETECTED   Methadone Scn, Ur NONE DETECTED NONE DETECTED  Ethanol  Result Value Ref Range   Alcohol, Ethyl (B) <10 <10 mg/dL  Lamotrigine level  Result Value Ref Range   Lamotrigine Lvl 6.8 2.0 - 20.0 ug/mL  Comprehensive metabolic panel  Result Value Ref Range   Sodium 140 135 - 145 mmol/L   Potassium 3.7 3.5 - 5.1 mmol/L   Chloride 107 98 - 111 mmol/L   CO2 26 22 - 32 mmol/L   Glucose, Bld 79 70 - 99 mg/dL   BUN 18 8 - 23 mg/dL   Creatinine, Ser 0.89 0.44 - 1.00 mg/dL   Calcium 8.2 (L) 8.9 - 10.3 mg/dL   Total Protein 5.0 (L) 6.5 - 8.1 g/dL   Albumin 2.6 (L) 3.5 - 5.0 g/dL   AST 81 (H) 15 - 41 U/L   ALT 48 (H) 0 - 44 U/L   Alkaline Phosphatase 29 (L) 38 - 126 U/L   Total Bilirubin 0.9 0.3 - 1.2 mg/dL   GFR calc non Af Amer >60 >60 mL/min   GFR calc Af Amer >60 >60 mL/min   Anion gap 7 5 - 15  CBC  Result Value Ref Range   WBC 6.7 4.0 - 10.5 K/uL   RBC 2.79 (L) 3.87 - 5.11 MIL/uL   Hemoglobin 10.2 (L) 12.0 - 15.0 g/dL   HCT 29.8 (L) 36.0 - 46.0 %   MCV 106.8 (H) 80.0 - 100.0 fL   MCH 36.6 (H) 26.0 - 34.0 pg   MCHC 34.2 30.0 - 36.0 g/dL   RDW 14.7 11.5 - 15.5 %   Platelets 83 (L) 150 - 400 K/uL   nRBC 0.0 0.0 - 0.2 %  Hepatitis panel, acute  Result Value Ref Range   Hepatitis B Surface Ag NON REACTIVE NON REACTIVE   HCV Ab NON REACTIVE NON REACTIVE   Hep A IgM NON REACTIVE NON REACTIVE   Hep B C IgM NON REACTIVE NON REACTIVE  Comprehensive metabolic panel  Result Value Ref Range   Sodium 138 135 - 145 mmol/L   Potassium 3.5 3.5 - 5.1 mmol/L   Chloride 105 98 - 111 mmol/L   CO2 27 22 - 32 mmol/L   Glucose, Bld 82 70 - 99 mg/dL   BUN 10 8 - 23 mg/dL   Creatinine, Ser 0.89 0.44 - 1.00 mg/dL   Calcium 8.1 (L) 8.9 - 10.3 mg/dL   Total Protein 4.6 (L) 6.5 - 8.1 g/dL   Albumin 2.5 (L) 3.5 - 5.0 g/dL   AST  50 (H) 15 - 41 U/L   ALT 40 0 - 44 U/L   Alkaline Phosphatase 26 (L) 38 - 126 U/L   Total Bilirubin 0.7 0.3 - 1.2 mg/dL   GFR calc non Af Amer >60 >60 mL/min   GFR calc Af Amer >60 >60 mL/min   Anion gap 6 5 - 15  Magnesium  Result Value Ref Range   Magnesium 1.6 (L) 1.7 - 2.4 mg/dL  CBC  Result Value Ref Range   WBC 5.2 4.0 - 10.5 K/uL   RBC 2.53 (L) 3.87 - 5.11 MIL/uL   Hemoglobin 9.4 (L) 12.0 - 15.0 g/dL   HCT  26.9 (L) 36.0 - 46.0 %   MCV 106.3 (H) 80.0 - 100.0 fL   MCH 37.2 (H) 26.0 - 34.0 pg   MCHC 34.9 30.0 - 36.0 g/dL   RDW 14.4 11.5 - 15.5 %   Platelets 66 (L) 150 - 400 K/uL   nRBC 0.0 0.0 - 0.2 %  Troponin I (High Sensitivity)  Result Value Ref Range   Troponin I (High Sensitivity) 159 (HH) <18 ng/L  Troponin I (High Sensitivity)  Result Value Ref Range   Troponin I (High Sensitivity) 143 (HH) <18 ng/L  Troponin I (High Sensitivity)  Result Value Ref Range   Troponin I (High Sensitivity) 85 (H) <18 ng/L  Troponin I (High Sensitivity)  Result Value Ref Range   Troponin I (High Sensitivity) 96 (H) <18 ng/L      Assessment & Plan:   Problem List Items Addressed This Visit      Cardiovascular and Mediastinum   Migraine    Severe, continue current regimen and follow up ASAP with Neurology for med mgmt on this as they are managing        Digestive   Intractable nausea and vomiting    Mildly improved, continue anti nausea medication and push PO as tolerated.         Other   Seizures ([place]) - Primary    In need of immediate f/u with outpatient Neurology, patient aware to call and schedule. Currently has keppra and lamictal and unsure of which or both she should be taking. It's not quite clear from discharge summary, but discussed to at minimum be taking the keppra and call right away to the Neurologist to see what to do with the lamictal.       Insomnia    D/c trazodone as pt wishes to not be on it and declines issue sleeping      Elevated troponin    No  active CP, urged Cardiology evaluation but pt again declines      Underweight    Continue megace, push PO. Discussed risks and long term issues with restricting her intake.           Follow up plan: Return in about 3 months (around 08/25/2019) for Weight check.

## 2019-06-02 DIAGNOSIS — R636 Underweight: Secondary | ICD-10-CM | POA: Insufficient documentation

## 2019-06-02 NOTE — Assessment & Plan Note (Signed)
Continue megace, push PO. Discussed risks and long term issues with restricting her intake.

## 2019-06-02 NOTE — Assessment & Plan Note (Signed)
No active CP, urged Cardiology evaluation but pt again declines

## 2019-06-02 NOTE — Assessment & Plan Note (Signed)
In need of immediate f/u with outpatient Neurology, patient aware to call and schedule. Currently has keppra and lamictal and unsure of which or both she should be taking. It's not quite clear from discharge summary, but discussed to at minimum be taking the keppra and call right away to the Neurologist to see what to do with the lamictal.

## 2019-06-02 NOTE — Assessment & Plan Note (Signed)
Severe, continue current regimen and follow up ASAP with Neurology for med mgmt on this as they are managing

## 2019-06-02 NOTE — Assessment & Plan Note (Signed)
D/c trazodone as pt wishes to not be on it and declines issue sleeping

## 2019-06-02 NOTE — Assessment & Plan Note (Signed)
Mildly improved, continue anti nausea medication and push PO as tolerated.

## 2019-06-21 ENCOUNTER — Encounter: Payer: Self-pay | Admitting: Family Medicine

## 2019-06-21 ENCOUNTER — Telehealth: Payer: Self-pay | Admitting: Family Medicine

## 2019-06-21 ENCOUNTER — Ambulatory Visit (INDEPENDENT_AMBULATORY_CARE_PROVIDER_SITE_OTHER): Payer: Medicare Other | Admitting: Family Medicine

## 2019-06-21 ENCOUNTER — Ambulatory Visit: Payer: Medicare Other | Admitting: Family Medicine

## 2019-06-21 DIAGNOSIS — R61 Generalized hyperhidrosis: Secondary | ICD-10-CM | POA: Diagnosis not present

## 2019-06-21 DIAGNOSIS — R569 Unspecified convulsions: Secondary | ICD-10-CM | POA: Diagnosis not present

## 2019-06-21 DIAGNOSIS — R351 Nocturia: Secondary | ICD-10-CM

## 2019-06-21 NOTE — Assessment & Plan Note (Signed)
Reiterated importance of close follow up and med mgmt with outpatient Neurologist as she's not had one since admission for seizure 3/21 and subsequent medication changes. Will run lamictal level today and pt to reach out for further direction and hopefully immediate follow up with Neurology. Has not had seizure activity since d/c. Continue avoiding driving, etc. Unclear if or how much of her current sxs may be med side effects so will obtain labs to r/o some other causes

## 2019-06-21 NOTE — Telephone Encounter (Signed)
Pt states she wakes up several times a night in sweat. She said its like water as been poured in her/ Pt wants to know if the medication she takes causes it / or if its normal / please advise

## 2019-06-21 NOTE — Progress Notes (Signed)
There were no vitals taken for this visit.   Subjective:    Patient ID: Kayla Shah, female    DOB: 26-Jun-1955, 64 y.o.   MRN: [id number]  HPI: Kayla Shah is a 65 y.o. female  Chief Complaint  Patient presents with  . Night Sweats    . This visit was completed via telephone due to the restrictions of the COVID-19 pandemic. All issues as above were discussed and addressed. Physical exam was done as above through visual confirmation on telephone. If it was felt that the patient should be evaluated in the office, they were directed there. The patient verbally consented to this visit. . Location of the patient: home . Location of the provider: home . Those involved with this call:  . Provider: Merrie Roof, PA-C . CMA: Lesle Chris, [place] . Front Desk/Registration: Jill Side  . Time spent on call: 25 minutes on the phone discussing health concerns. 5 minutes total spent in review of patient's record and preparation of their chart. I verified patient identity using two factors (patient name and date of birth). Patient consents verbally to being seen via telemedicine visit today.   Currently having episodes where she wakes up drenched in sweat about 7 times per night. This seemed to start around the time of discharge from hospital. Now also urinating about 5 times per night which she's never done before either. Concerned that all of this is from her lamictal which was started during admission as depakote was causing significant sedation. Had also been started on keppra but thiinks she was supposed to stop that after discharge so has not been taking that. Does not follow up with Neurology until August. Aside from sweats and urinary frequency, feeling better than she has in a while. Denies fever, cough, CP, SOB, N/V/D.   Has been eating well since starting the megace and is hoping to have gained weight. Notes her pants are fitting tighter than before.   Relevant past medical,  surgical, family and social history reviewed and updated as indicated. Interim medical history since our last visit reviewed. Allergies and medications reviewed and updated.  Review of Systems  Per HPI unless specifically indicated above     Objective:    There were no vitals taken for this visit.  Wt Readings from Last 3 Encounters:  05/26/19 107 lb (48.5 kg)  05/01/19 104 lb 11.5 oz (47.5 kg)  04/27/19 109 lb 12.8 oz (49.8 kg)    Physical Exam  Unable to perform PE due to patient lack of access to video technology for today's visit.   Results for orders placed or performed during the hospital encounter of 05/01/19  SARS CORONAVIRUS 2 (TAT 6-24 HRS) Nasopharyngeal Nasopharyngeal Swab   Specimen: Nasopharyngeal Swab  Result Value Ref Range   SARS Coronavirus 2 NEGATIVE NEGATIVE  Valproic acid level  Result Value Ref Range   Valproic Acid Lvl 38 (L) 50.0 - 100.0 ug/mL  CBC with Differential  Result Value Ref Range   WBC 7.7 4.0 - 10.5 K/uL   RBC 3.63 (L) 3.87 - 5.11 MIL/uL   Hemoglobin 13.1 12.0 - 15.0 g/dL   HCT 37.9 36.0 - 46.0 %   MCV 104.4 (H) 80.0 - 100.0 fL   MCH 36.1 (H) 26.0 - 34.0 pg   MCHC 34.6 30.0 - 36.0 g/dL   RDW 14.6 11.5 - 15.5 %   Platelets 134 (L) 150 - 400 K/uL   nRBC 0.0 0.0 - 0.2 %  Neutrophils Relative % 53 %   Neutro Abs 4.1 1.7 - 7.7 K/uL   Lymphocytes Relative 29 %   Lymphs Abs 2.3 0.7 - 4.0 K/uL   Monocytes Relative 16 %   Monocytes Absolute 1.2 (H) 0.1 - 1.0 K/uL   Eosinophils Relative 2 %   Eosinophils Absolute 0.2 0.0 - 0.5 K/uL   Basophils Relative 0 %   Basophils Absolute 0.0 0.0 - 0.1 K/uL   Immature Granulocytes 0 %   Abs Immature Granulocytes 0.03 0.00 - 0.07 K/uL  Magnesium  Result Value Ref Range   Magnesium 1.8 1.7 - 2.4 mg/dL  Comprehensive metabolic panel  Result Value Ref Range   Sodium 138 135 - 145 mmol/L   Potassium 3.2 (L) 3.5 - 5.1 mmol/L   Chloride 99 98 - 111 mmol/L   CO2 26 22 - 32 mmol/L   Glucose, Bld 112  (H) 70 - 99 mg/dL   BUN 21 8 - 23 mg/dL   Creatinine, Ser 1.03 (H) 0.44 - 1.00 mg/dL   Calcium 9.4 8.9 - 10.3 mg/dL   Total Protein 7.0 6.5 - 8.1 g/dL   Albumin 3.8 3.5 - 5.0 g/dL   AST 63 (H) 15 - 41 U/L   ALT 41 0 - 44 U/L   Alkaline Phosphatase 40 38 - 126 U/L   Total Bilirubin 1.2 0.3 - 1.2 mg/dL   GFR calc non Af Amer 58 (L) >60 mL/min   GFR calc Af Amer >60 >60 mL/min   Anion gap 13 5 - 15  Lipase, blood  Result Value Ref Range   Lipase 25 11 - 51 U/L  Urinalysis, Routine w reflex microscopic  Result Value Ref Range   Color, Urine AMBER (A) YELLOW   APPearance HAZY (A) CLEAR   Specific Gravity, Urine 1.041 (H) 1.005 - 1.030   pH 7.0 5.0 - 8.0   Glucose, UA NEGATIVE NEGATIVE mg/dL   Hgb urine dipstick SMALL (A) NEGATIVE   Bilirubin Urine NEGATIVE NEGATIVE   Ketones, ur 5 (A) NEGATIVE mg/dL   Protein, ur NEGATIVE NEGATIVE mg/dL   Nitrite NEGATIVE NEGATIVE   Leukocytes,Ua NEGATIVE NEGATIVE   RBC / HPF 21-50 0 - 5 RBC/hpf   WBC, UA 6-10 0 - 5 WBC/hpf   Bacteria, UA NONE SEEN NONE SEEN   Squamous Epithelial / LPF NONE SEEN 0 - 5   Mucus PRESENT   Urine Drug Screen, Qualitative ([facility] only)  Result Value Ref Range   Tricyclic, Ur Screen NONE DETECTED NONE DETECTED   Amphetamines, Ur Screen NONE DETECTED NONE DETECTED   MDMA (Ecstasy)Ur Screen NONE DETECTED NONE DETECTED   Cocaine Metabolite,Ur [place] NONE DETECTED NONE DETECTED   Opiate, Ur Screen NONE DETECTED NONE DETECTED   Phencyclidine (PCP) Ur S NONE DETECTED NONE DETECTED   Cannabinoid 50 Ng, Ur [place] POSITIVE (A) NONE DETECTED   Barbiturates, Ur Screen NONE DETECTED NONE DETECTED   Benzodiazepine, Ur Scrn NONE DETECTED NONE DETECTED   Methadone Scn, Ur NONE DETECTED NONE DETECTED  Ethanol  Result Value Ref Range   Alcohol, Ethyl (B) <10 <10 mg/dL  Lamotrigine level  Result Value Ref Range   Lamotrigine Lvl 6.8 2.0 - 20.0 ug/mL  Comprehensive metabolic panel  Result Value Ref Range   Sodium 140 135 - 145 mmol/L    Potassium 3.7 3.5 - 5.1 mmol/L   Chloride 107 98 - 111 mmol/L   CO2 26 22 - 32 mmol/L   Glucose, Bld 79 70 - 99 mg/dL  BUN 18 8 - 23 mg/dL   Creatinine, Ser 0.89 0.44 - 1.00 mg/dL   Calcium 8.2 (L) 8.9 - 10.3 mg/dL   Total Protein 5.0 (L) 6.5 - 8.1 g/dL   Albumin 2.6 (L) 3.5 - 5.0 g/dL   AST 81 (H) 15 - 41 U/L   ALT 48 (H) 0 - 44 U/L   Alkaline Phosphatase 29 (L) 38 - 126 U/L   Total Bilirubin 0.9 0.3 - 1.2 mg/dL   GFR calc non Af Amer >60 >60 mL/min   GFR calc Af Amer >60 >60 mL/min   Anion gap 7 5 - 15  CBC  Result Value Ref Range   WBC 6.7 4.0 - 10.5 K/uL   RBC 2.79 (L) 3.87 - 5.11 MIL/uL   Hemoglobin 10.2 (L) 12.0 - 15.0 g/dL   HCT 29.8 (L) 36.0 - 46.0 %   MCV 106.8 (H) 80.0 - 100.0 fL   MCH 36.6 (H) 26.0 - 34.0 pg   MCHC 34.2 30.0 - 36.0 g/dL   RDW 14.7 11.5 - 15.5 %   Platelets 83 (L) 150 - 400 K/uL   nRBC 0.0 0.0 - 0.2 %  Hepatitis panel, acute  Result Value Ref Range   Hepatitis B Surface Ag NON REACTIVE NON REACTIVE   HCV Ab NON REACTIVE NON REACTIVE   Hep A IgM NON REACTIVE NON REACTIVE   Hep B C IgM NON REACTIVE NON REACTIVE  Comprehensive metabolic panel  Result Value Ref Range   Sodium 138 135 - 145 mmol/L   Potassium 3.5 3.5 - 5.1 mmol/L   Chloride 105 98 - 111 mmol/L   CO2 27 22 - 32 mmol/L   Glucose, Bld 82 70 - 99 mg/dL   BUN 10 8 - 23 mg/dL   Creatinine, Ser 0.89 0.44 - 1.00 mg/dL   Calcium 8.1 (L) 8.9 - 10.3 mg/dL   Total Protein 4.6 (L) 6.5 - 8.1 g/dL   Albumin 2.5 (L) 3.5 - 5.0 g/dL   AST 50 (H) 15 - 41 U/L   ALT 40 0 - 44 U/L   Alkaline Phosphatase 26 (L) 38 - 126 U/L   Total Bilirubin 0.7 0.3 - 1.2 mg/dL   GFR calc non Af Amer >60 >60 mL/min   GFR calc Af Amer >60 >60 mL/min   Anion gap 6 5 - 15  Magnesium  Result Value Ref Range   Magnesium 1.6 (L) 1.7 - 2.4 mg/dL  CBC  Result Value Ref Range   WBC 5.2 4.0 - 10.5 K/uL   RBC 2.53 (L) 3.87 - 5.11 MIL/uL   Hemoglobin 9.4 (L) 12.0 - 15.0 g/dL   HCT 26.9 (L) 36.0 - 46.0 %   MCV 106.3  (H) 80.0 - 100.0 fL   MCH 37.2 (H) 26.0 - 34.0 pg   MCHC 34.9 30.0 - 36.0 g/dL   RDW 14.4 11.5 - 15.5 %   Platelets 66 (L) 150 - 400 K/uL   nRBC 0.0 0.0 - 0.2 %  Troponin I (High Sensitivity)  Result Value Ref Range   Troponin I (High Sensitivity) 159 (HH) <18 ng/L  Troponin I (High Sensitivity)  Result Value Ref Range   Troponin I (High Sensitivity) 143 (HH) <18 ng/L  Troponin I (High Sensitivity)  Result Value Ref Range   Troponin I (High Sensitivity) 85 (H) <18 ng/L  Troponin I (High Sensitivity)  Result Value Ref Range   Troponin I (High Sensitivity) 96 (H) <18 ng/L      Assessment &  Plan:   Problem List Items Addressed This Visit      Other   Seizures ([place]) - Primary    Reiterated importance of close follow up and med mgmt with outpatient Neurologist as she's not had one since admission for seizure 3/21 and subsequent medication changes. Will run lamictal level today and pt to reach out for further direction and hopefully immediate follow up with Neurology. Has not had seizure activity since d/c. Continue avoiding driving, etc. Unclear if or how much of her current sxs may be med side effects so will obtain labs to r/o some other causes      Relevant Orders   Lamotrigine level    Other Visit Diagnoses    Night sweats       ?lamictal side effect, will obtain basic labs to r/o other causes. Discussed red flag sxs, return precautions at length   Relevant Orders   CBC with Differential/Platelet   Comprehensive metabolic panel   TSH   Nocturia       Obtain u/a to r/o infection, continue to monitor closely   Relevant Orders   UA/M w/rflx Culture, Routine       Follow up plan: Return for a scheduled.

## 2019-06-27 ENCOUNTER — Other Ambulatory Visit: Payer: Self-pay | Admitting: Family Medicine

## 2019-06-27 DIAGNOSIS — E7800 Pure hypercholesterolemia, unspecified: Secondary | ICD-10-CM

## 2019-06-28 ENCOUNTER — Other Ambulatory Visit: Payer: Self-pay

## 2019-06-28 ENCOUNTER — Other Ambulatory Visit: Payer: Medicare Other

## 2019-06-29 LAB — COMPREHENSIVE METABOLIC PANEL
ALT: 9 IU/L (ref 0–32)
AST: 15 IU/L (ref 0–40)
Albumin/Globulin Ratio: 1.7 (ref 1.2–2.2)
Albumin: 4.2 g/dL (ref 3.8–4.8)
Alkaline Phosphatase: 60 IU/L (ref 48–121)
BUN/Creatinine Ratio: 17 (ref 12–28)
BUN: 15 mg/dL (ref 8–27)
Bilirubin Total: <0.2 mg/dL (ref 0.0–1.2)
CO2: 19 mmol/L — ABNORMAL LOW (ref 20–29)
Calcium: 9.2 mg/dL (ref 8.7–10.3)
Chloride: 103 mmol/L (ref 96–106)
Creatinine, Ser: 0.86 mg/dL (ref 0.57–1.00)
GFR calc Af Amer: 83 mL/min/{1.73_m2} (ref >59)
GFR calc non Af Amer: 72 mL/min/{1.73_m2} (ref >59)
Globulin, Total: 2.5 g/dL (ref 1.5–4.5)
Glucose: 125 mg/dL — ABNORMAL HIGH (ref 65–99)
Potassium: 3.8 mmol/L (ref 3.5–5.2)
Sodium: 141 mmol/L (ref 134–144)
Total Protein: 6.7 g/dL (ref 6.0–8.5)

## 2019-06-29 LAB — CBC WITH DIFFERENTIAL/PLATELET
Basophils Absolute: 0 10*3/uL (ref 0.0–0.2)
Basos: 1 %
EOS (ABSOLUTE): 0.1 10*3/uL (ref 0.0–0.4)
Eos: 1 %
Hematocrit: 33.6 % — ABNORMAL LOW (ref 34.0–46.6)
Hemoglobin: 11.1 g/dL (ref 11.1–15.9)
Immature Grans (Abs): 0 10*3/uL (ref 0.0–0.1)
Immature Granulocytes: 0 %
Lymphocytes Absolute: 2.1 10*3/uL (ref 0.7–3.1)
Lymphs: 34 %
MCH: 35 pg — ABNORMAL HIGH (ref 26.6–33.0)
MCHC: 33 g/dL (ref 31.5–35.7)
MCV: 106 fL — ABNORMAL HIGH (ref 79–97)
Monocytes Absolute: 0.3 10*3/uL (ref 0.1–0.9)
Monocytes: 5 %
Neutrophils Absolute: 3.7 10*3/uL (ref 1.4–7.0)
Neutrophils: 59 %
Platelets: 238 10*3/uL (ref 150–450)
RBC: 3.17 x10E6/uL — ABNORMAL LOW (ref 3.77–5.28)
RDW: 11.8 % (ref 11.7–15.4)
WBC: 6.3 10*3/uL (ref 3.4–10.8)

## 2019-06-29 LAB — TSH: TSH: 0.85 u[IU]/mL (ref 0.450–4.500)

## 2019-06-29 LAB — LAMOTRIGINE LEVEL: Lamotrigine Lvl: 8.5 ug/mL (ref 2.0–20.0)

## 2019-07-04 LAB — UA/M W/RFLX CULTURE, ROUTINE
Bilirubin, UA: NEGATIVE
Glucose, UA: NEGATIVE
Ketones, UA: NEGATIVE
Nitrite, UA: NEGATIVE
Protein,UA: NEGATIVE
Specific Gravity, UA: >1.03 — ABNORMAL HIGH (ref 1.005–1.030)
Urobilinogen, Ur: 0.2 mg/dL (ref 0.2–1.0)
pH, UA: 5.5 (ref 5.0–7.5)

## 2019-07-04 LAB — MICROSCOPIC EXAMINATION

## 2019-07-04 LAB — URINE CULTURE, REFLEX

## 2019-07-20 ENCOUNTER — Other Ambulatory Visit: Payer: Self-pay | Admitting: Family Medicine

## 2019-07-20 DIAGNOSIS — I10 Essential (primary) hypertension: Secondary | ICD-10-CM

## 2019-08-09 ENCOUNTER — Other Ambulatory Visit: Payer: Self-pay | Admitting: Family Medicine

## 2019-08-09 DIAGNOSIS — K219 Gastro-esophageal reflux disease without esophagitis: Secondary | ICD-10-CM

## 2019-08-10 ENCOUNTER — Other Ambulatory Visit: Payer: Self-pay

## 2019-08-10 ENCOUNTER — Ambulatory Visit (INDEPENDENT_AMBULATORY_CARE_PROVIDER_SITE_OTHER): Payer: Medicare Other | Admitting: Family Medicine

## 2019-08-10 ENCOUNTER — Encounter: Payer: Self-pay | Admitting: Family Medicine

## 2019-08-10 VITALS — BP 138/80 | HR 74 | Temp 98.1°F | Wt 117.0 lb

## 2019-08-10 DIAGNOSIS — R636 Underweight: Secondary | ICD-10-CM | POA: Diagnosis not present

## 2019-08-10 DIAGNOSIS — F419 Anxiety disorder, unspecified: Secondary | ICD-10-CM

## 2019-08-10 DIAGNOSIS — F329 Major depressive disorder, single episode, unspecified: Secondary | ICD-10-CM

## 2019-08-10 DIAGNOSIS — R222 Localized swelling, mass and lump, trunk: Secondary | ICD-10-CM

## 2019-08-10 DIAGNOSIS — E8941 Symptomatic postprocedural ovarian failure: Secondary | ICD-10-CM

## 2019-08-10 DIAGNOSIS — F32A Depression, unspecified: Secondary | ICD-10-CM

## 2019-08-10 MED ORDER — ESTROGENS CONJUGATED 0.625 MG PO TABS: 0.6250 mg | ORAL_TABLET | Freq: Every day | ORAL | 2 refills | Status: DC

## 2019-08-10 NOTE — Progress Notes (Signed)
BP 138/80   Pulse 74   Temp 98.1 F (36.7 C) (Oral)   Wt 117 lb (53.1 kg)   SpO2 99%   BMI 18.32 kg/m    Subjective:    Patient ID: Kayla Shah, female    DOB: Aug 24, 1955, 64 y.o.   MRN: [id number]  HPI: Kayla Shah is a 64 y.o. female  Chief Complaint  Patient presents with  . Weight Check  . Hot Flashes   Here today for weight check and also has some other concerns. Has gained about 10 lb the last few months, appetite much improved. Stopped taking the megace a few days ago and has not had a drop in appetite so wanting to stop taking it for now. Holding down PO well, no N/V.   Used to be on premarin for menopausal sxs for years, back to having miserable hot flashes that are quite bothersome for her and wanting to restart. Hx of hysterectomy.   Notes she's under a lot of stress which has exacerbated her anxiety. Requesting valium or something similar as this is all that's ever helped her with these sxs.   Has a firm mass above her umbilicus that she states has been present for about 20 years but seems to be getting larger and more tender. Concerned about this area and wanting workup.  Depression screen [facility] 2/9 05/26/2019 09/08/2018 03/10/2018  Decreased Interest 2 0 1  Down, Depressed, Hopeless 2 0 1  PHQ - 2 Score 4 0 2  Altered sleeping 0 0 1  Tired, decreased energy 0 0 0  Change in appetite 2 0 0  Feeling bad or failure about yourself  0 0 0  Trouble concentrating 0 1 0  Moving slowly or fidgety/restless 3 0 0  Suicidal thoughts 0 0 0  PHQ-9 Score 9 1 3   Difficult doing work/chores Not difficult at all - -   GAD 7 : Generalized Anxiety Score 05/26/2019 09/08/2018 03/10/2018 11/12/2017  Nervous, Anxious, on Edge 3 2 3 1   Control/stop worrying 3 2 3 1   Worry too much - different things 3 1 3 1   Trouble relaxing 3 1 3 1   Restless 2 1 3  0  Easily annoyed or irritable 1 1 1 1   Afraid - awful might happen 1 0 1 0  Total GAD 7 Score 16 8 17 5   Anxiety Difficulty  Somewhat difficult Somewhat difficult - Not difficult at all   Relevant past medical, surgical, family and social history reviewed and updated as indicated. Interim medical history since our last visit reviewed. Allergies and medications reviewed and updated.  Review of Systems  Per HPI unless specifically indicated above     Objective:    BP 138/80   Pulse 74   Temp 98.1 F (36.7 C) (Oral)   Wt 117 lb (53.1 kg)   SpO2 99%   BMI 18.32 kg/m   Wt Readings from Last 3 Encounters:  08/10/19 117 lb (53.1 kg)  05/26/19 107 lb (48.5 kg)  05/01/19 104 lb 11.5 oz (47.5 kg)    Physical Exam Vitals and nursing note reviewed.  Constitutional:      Appearance: Normal appearance. She is not ill-appearing.     Comments: underweight  HENT:     Head: Atraumatic.  Eyes:     Extraocular Movements: Extraocular movements intact.     Conjunctiva/sclera: Conjunctivae normal.  Cardiovascular:     Rate and Rhythm: Normal rate and regular rhythm.  Heart sounds: Normal heart sounds.  Pulmonary:     Effort: Pulmonary effort is normal.     Breath sounds: Normal breath sounds.  Musculoskeletal:        General: Normal range of motion.     Cervical back: Normal range of motion and neck supple.  Skin:    General: Skin is warm and dry.     Comments: 1.5-2 cm round mass palpable directly above umbilicus. Not reducible. No erythema, warmth. Mildly ttp  Neurological:     Mental Status: She is alert and oriented to person, place, and time.  Psychiatric:        Mood and Affect: Mood normal.        Thought Content: Thought content normal.        Judgment: Judgment normal.     Results for orders placed or performed in visit on 06/21/19  Microscopic Examination   URINE  Result Value Ref Range   WBC, UA 6-10 (A) 0 - 5 /hpf   RBC 3-10 (A) 0 - 2 /hpf   Epithelial Cells (non renal) 0-10 0 - 10 /hpf   Bacteria, UA Few (A) None seen/Few  Urine Culture, Reflex   URINE  Result Value Ref Range    Urine Culture, Routine Final report (A)    Organism ID, Bacteria Comment (A)    Antimicrobial Susceptibility Comment   Lamotrigine level  Result Value Ref Range   Lamotrigine Lvl 8.5 2.0 - 20.0 ug/mL  CBC with Differential/Platelet  Result Value Ref Range   WBC 6.3 3.4 - 10.8 x10E3/uL   RBC 3.17 (L) 3.77 - 5.28 x10E6/uL   Hemoglobin 11.1 11.1 - 15.9 g/dL   Hematocrit 33.6 (L) 34.0 - 46.6 %   MCV 106 (H) 79 - 97 fL   MCH 35.0 (H) 26.6 - 33.0 pg   MCHC 33.0 31 - 35 g/dL   RDW 11.8 11.7 - 15.4 %   Platelets 238 150 - 450 x10E3/uL   Neutrophils 59 Not Estab. %   Lymphs 34 Not Estab. %   Monocytes 5 Not Estab. %   Eos 1 Not Estab. %   Basos 1 Not Estab. %   Neutrophils Absolute 3.7 1 - 7 x10E3/uL   Lymphocytes Absolute 2.1 0 - 3 x10E3/uL   Monocytes Absolute 0.3 0 - 0 x10E3/uL   EOS (ABSOLUTE) 0.1 0.0 - 0.4 x10E3/uL   Basophils Absolute 0.0 0 - 0 x10E3/uL   Immature Granulocytes 0 Not Estab. %   Immature Grans (Abs) 0.0 0.0 - 0.1 x10E3/uL  Comprehensive metabolic panel  Result Value Ref Range   Glucose 125 (H) 65 - 99 mg/dL   BUN 15 8 - 27 mg/dL   Creatinine, Ser 0.86 0.57 - 1.00 mg/dL   GFR calc non Af Amer 72 >59 mL/min/1.73   GFR calc Af Amer 83 >59 mL/min/1.73   BUN/Creatinine Ratio 17 12 - 28   Sodium 141 134 - 144 mmol/L   Potassium 3.8 3.5 - 5.2 mmol/L   Chloride 103 96 - 106 mmol/L   CO2 19 (L) 20 - 29 mmol/L   Calcium 9.2 8.7 - 10.3 mg/dL   Total Protein 6.7 6.0 - 8.5 g/dL   Albumin 4.2 3.8 - 4.8 g/dL   Globulin, Total 2.5 1.5 - 4.5 g/dL   Albumin/Globulin Ratio 1.7 1.2 - 2.2   Bilirubin Total <0.2 0.0 - 1.2 mg/dL   Alkaline Phosphatase 60 48 - 121 IU/L   AST 15 0 - 40 IU/L  ALT 9 0 - 32 IU/L  TSH  Result Value Ref Range   TSH 0.850 0.450 - 4.500 uIU/mL  UA/M w/rflx Culture, Routine   Specimen: Urine   URINE  Result Value Ref Range   Specific Gravity, UA >1.030 (H) 1.005 - 1.030   pH, UA 5.5 5.0 - 7.5   Color, UA Yellow Yellow   Appearance Ur Clear  Clear   Leukocytes,UA 1+ (A) Negative   Protein,UA Negative Negative/Trace   Glucose, UA Negative Negative   Ketones, UA Negative Negative   RBC, UA Trace (A) Negative   Bilirubin, UA Negative Negative   Urobilinogen, Ur 0.2 0.2 - 1.0 mg/dL   Nitrite, UA Negative Negative   Microscopic Examination See below:    Urinalysis Reflex Comment       Assessment & Plan:   Problem List Items Addressed This Visit      Other   Anxiety and depression - Primary    Declines preventative anxiety medications though has severe daily sxs. Will refer to Psychiatry again (was not able to establish last time referred) for management      Relevant Orders   Ambulatory referral to Psychiatry   Hot flashes due to surgical menopause    Restart premarin, continue to monitor for benefit      Underweight    Improving, with consistently good appetite lately. May continue off megace until needed and continue good PO intake       Other Visit Diagnoses    Abdominal wall mass       Will get u/s and proceed based on results. Go to ER if significantly worsening in meantime, but given duration low suspicion for that   Relevant Orders   US Abdomen Limited       Follow up plan: Return in about 3 months (around 11/10/2019) for weight, hot flash f/u.

## 2019-08-15 ENCOUNTER — Other Ambulatory Visit: Payer: Self-pay | Admitting: Family Medicine

## 2019-08-15 ENCOUNTER — Telehealth: Payer: Self-pay

## 2019-08-15 MED ORDER — LAMOTRIGINE 100 MG PO TABS: 100.0000 mg | ORAL_TABLET | Freq: Two times a day (BID) | ORAL | 3 refills | Status: DC

## 2019-08-15 NOTE — Telephone Encounter (Signed)
Kalee from [facility] called to discuss a refill of pt's lamictal. Please call.

## 2019-08-15 NOTE — Assessment & Plan Note (Signed)
Declines preventative anxiety medications though has severe daily sxs. Will refer to Psychiatry again (was not able to establish last time referred) for management

## 2019-08-15 NOTE — Addendum Note (Signed)
Addended by: Brandon Melnick on: 08/15/2019 02:36 PM   Modules accepted: Orders

## 2019-08-15 NOTE — Telephone Encounter (Signed)
Pt called and left a VM quite upset that we have not refilled her lamictal yet. Please call ASAP.

## 2019-08-15 NOTE — Telephone Encounter (Signed)
Requested medication (s) are due for refill today: yes  Requested medication (s) are on the active medication list: yes  Last refill:  07/11/2019  Future visit scheduled: yes  Notes to clinic:  medication was prescribed by different provider    Requested Prescriptions  Pending Prescriptions Disp Refills   lamoTRIgine (LAMICTAL) 25 MG tablet [Pharmacy Med Name: LAMOTRIGINE 25 MG TAB] 240 tablet 2    Sig: TAKE 4 TABLETS BY MOUTH TWICE A DAY      Not Delegated - Neurology:  Anticonvulsants Failed - 08/15/2019 11:30 AM      Failed - This refill cannot be delegated      Failed - HCT in normal range and within 360 days    Hematocrit  Date Value Ref Range Status  06/28/2019 33.6 (L) 34.0 - 46.6 % Final          Passed - HGB in normal range and within 360 days    Hemoglobin  Date Value Ref Range Status  06/28/2019 11.1 11.1 - 15.9 g/dL Final          Passed - PLT in normal range and within 360 days    Platelets  Date Value Ref Range Status  06/28/2019 238 150 - 450 x10E3/uL Final          Passed - WBC in normal range and within 360 days    WBC  Date Value Ref Range Status  06/28/2019 6.3 3.4 - 10.8 x10E3/uL Final  05/03/2019 5.2 4.0 - 10.5 K/uL Final          Passed - Valid encounter within last 12 months    Recent Outpatient Visits           5 days ago Anxiety and depression   [facility] Volney American, PA-C   1 month ago Seizures [facility])   [place], [place], [place]   2 months ago Seizures [facility])   [place], [place], [place]   5 months ago Essential (primary) hypertension   [facility] Volney American, [place]   11 months ago Essential (primary) hypertension   [place], Lilia Argue, [place]       Future Appointments             In 3 months Orene Desanctis, [place] Argue, [place], [place]

## 2019-08-15 NOTE — Telephone Encounter (Signed)
I called pt and she is taking 100mg  po bid (on the 25mg  tabs).  I relayed will be switching to new tablet strength 100mg  po bid.  She verbalized understanding.   She is not taking divalproex.   Also wanted to apologize for message left on VM.

## 2019-08-15 NOTE — Assessment & Plan Note (Signed)
Restart premarin, continue to monitor for benefit

## 2019-08-15 NOTE — Assessment & Plan Note (Signed)
Improving, with consistently good appetite lately. May continue off megace until needed and continue good PO intake

## 2019-08-17 ENCOUNTER — Ambulatory Visit
Admission: RE | Admit: 2019-08-17 | Discharge: 2019-08-17 | Disposition: A | Discharge status: Home / Self Care | Payer: Medicare Other | Source: Ambulatory Visit | Attending: Family Medicine | Admitting: Family Medicine

## 2019-08-17 ENCOUNTER — Other Ambulatory Visit: Payer: Self-pay

## 2019-08-17 DIAGNOSIS — R222 Localized swelling, mass and lump, trunk: Secondary | ICD-10-CM | POA: Diagnosis present

## 2019-08-18 ENCOUNTER — Other Ambulatory Visit: Payer: Self-pay | Admitting: Family Medicine

## 2019-08-18 DIAGNOSIS — K439 Ventral hernia without obstruction or gangrene: Secondary | ICD-10-CM

## 2019-08-25 ENCOUNTER — Ambulatory Visit: Payer: Medicare Other | Admitting: Family Medicine

## 2019-08-25 ENCOUNTER — Other Ambulatory Visit: Payer: Self-pay | Admitting: Family Medicine

## 2019-08-25 NOTE — Telephone Encounter (Signed)
Requested medication (s) are due for refill today - yes  Requested medication (s) are on the active medication list -yes  Future visit scheduled -yes  Last refill: 04/28/19  Notes to clinic: Request for RF- non delegated Rx  Requested Prescriptions  Pending Prescriptions Disp Refills   ondansetron (ZOFRAN-ODT) 8 MG disintegrating tablet [Pharmacy Med Name: ONDANSETRON 8 MG ODT] 30 tablet 0    Sig: TAKE 1 TABLET BY MOUTH EVERY 8 HOURS AS NEEDED FOR NAUSEA OR VOMITING      Not Delegated - Gastroenterology: Antiemetics Failed - 08/25/2019 10:37 AM      Failed - This refill cannot be delegated      Passed - Valid encounter within last 6 months    Recent Outpatient Visits           2 weeks ago Anxiety and depression   [facility] Volney American, [place]   2 months ago Seizures [facility])   [place], [place], [place]   3 months ago Seizures [facility])   [place], [place], [place]   5 months ago Essential (primary) hypertension   [facility] Volney American, [place]   11 months ago Essential (primary) hypertension   [facility], [place], [place]       Future Appointments             In 2 months [facility] Desanctis, [place] Argue, [place], [place]                Requested Prescriptions  Pending Prescriptions Disp Refills   ondansetron (ZOFRAN-ODT) 8 MG disintegrating tablet [Pharmacy Med Name: ONDANSETRON 8 MG ODT] 30 tablet 0    Sig: TAKE 1 TABLET BY MOUTH EVERY 8 HOURS AS NEEDED FOR NAUSEA OR VOMITING      Not Delegated - Gastroenterology: Antiemetics Failed - 08/25/2019 10:37 AM      Failed - This refill cannot be delegated      Passed - Valid encounter within last 6 months    Recent Outpatient Visits           2 weeks ago Anxiety and depression   [facility] Volney American, [place]   2 months ago Seizures [facility])   [place], [place], [place]   3 months ago Seizures [facility])   [place], [place], [place]   5 months ago Essential (primary) hypertension   [place], [place], [place]   11 months ago Essential (primary) hypertension   [place], [place] Arg[place], [place]       Future Appointments             In 2 months Orene Desanctis, [place] Argue, [place], [place]

## 2019-08-25 NOTE — Telephone Encounter (Signed)
Routing to provider  

## 2019-08-30 ENCOUNTER — Ambulatory Visit: Payer: Medicare Other | Admitting: General Surgery

## 2019-08-30 ENCOUNTER — Other Ambulatory Visit: Payer: Self-pay | Admitting: General Surgery

## 2019-08-30 ENCOUNTER — Other Ambulatory Visit: Payer: Self-pay

## 2019-08-30 ENCOUNTER — Encounter: Payer: Self-pay | Admitting: General Surgery

## 2019-08-30 ENCOUNTER — Telehealth: Payer: Self-pay

## 2019-08-30 VITALS — Temp 98.2°F | Ht 65.0 in | Wt 120.6 lb

## 2019-08-30 DIAGNOSIS — K439 Ventral hernia without obstruction or gangrene: Secondary | ICD-10-CM

## 2019-08-30 NOTE — H&P (View-Only) (Signed)
Patient ID: Kayla Shah, female   DOB: April 09, 1955, 64 y.o.   MRN: [id number]  Chief Complaint  Patient presents with  . New Patient (Initial Visit)    Hernia    HPI Kayla Shah is a 64 y.o. female.   Kayla Shah has been referred by her primary care provider, Merrie Roof, PA-C, for evaluation of a ventral hernia.  Kayla Shah states that Kayla Shah has had a hernia for 10 to 15 years, but that it did not bother her until 1 to 2 months ago.  Kayla Shah notices bulging and pain with lifting.  Kayla Shah denies any alteration of bowel function.  No nausea or vomiting.  The pain is localized to the area of the hernia and does not radiate.  Kayla Shah has a history of multiple laparoscopic procedures for endometriosis in the past.  Kayla Shah has since undergone a total hysterectomy (1984).  An ultrasound was performed that demonstrated a small, fat-containing hernia just above the umbilicus.   Past Medical History:  Diagnosis Date  . Allergy   . Anxiety    agoraphobia   . Arthritis   . Chicken pox   . COPD (chronic obstructive pulmonary disease) ([place])   . Depression   . GERD (gastroesophageal reflux disease)   . Headache   . History of blood transfusion    1984  . Hyperlipidemia   . Hypertension   . Migraine   . Seizure [facility])    last seizure 04/2019    Past Surgical History:  Procedure Laterality Date  . ABDOMINAL HYSTERECTOMY  1984   Total  . APPENDECTOMY  1984  . COLONOSCOPY WITH PROPOFOL N/A 03/26/2017   Procedure: COLONOSCOPY WITH PROPOFOL;  Surgeon: Jonathon Bellows, MD;  Location: [facility] ENDOSCOPY;  Service: Gastroenterology;  Laterality: N/A;  . WISDOM TOOTH EXTRACTION      Family History  Problem Relation Age of Onset  . Stroke Mother   . Depression Mother   . Arthritis Mother   . Heart disease Father   . Alcohol abuse Father   . Diabetes Father   . Osteoporosis Sister   . Kidney disease Sister   . Arthritis Sister   . Arthritis Maternal Grandmother   . Heart disease Maternal Grandmother   .  Hyperlipidemia Maternal Grandmother   . Diabetes Paternal Grandmother   . Breast cancer Neg Hx     Social History Social History   Tobacco Use  . Smoking status: Former Smoker    Packs/day: 0.25    Years: 45.00    Pack years: 11.25    Types: Cigarettes  . Smokeless tobacco: Never Used  . Tobacco comment: reports quitting in feb 2021  Vaping Use  . Vaping Use: Never used  Substance Use Topics  . Alcohol use: No  . Drug use: Yes    Types: Marijuana    Allergies  Allergen Reactions  . Paroxetine Hcl Other (See Comments)    Pt. Becomes very aggressive with mood swings.  . Codeine Nausea And Vomiting  . Fluoxetine Anxiety  . Paroxetine Anxiety    Current Outpatient Medications  Medication Sig Dispense Refill  . Calcium-Magnesium-Vitamin D (CALCIUM 500 PO) Take 1 tablet by mouth daily.     . cholecalciferol (VITAMIN D) 1000 units tablet Take 1,000 Units by mouth daily.    Marland Kitchen estrogens, conjugated, ([name]) 0.625 MG tablet Take 1 tablet (0.625 mg total) by mouth daily. Take daily for 21 days then do not take for 7 days. 30 tablet 2  . gabapentin (  NEURONTIN) 300 MG capsule TAKE ONE CAPSULE BY MOUTH TWICE A DAY (Patient taking differently: Take 300 mg by mouth 2 (two) times daily. ) 60 capsule 3  . ibuprofen (ADVIL) 200 MG tablet Take 200-600 mg by mouth every 6 (six) hours as needed for fever or mild pain.     Marland Kitchen lamoTRIgine (LAMICTAL) 100 MG tablet Take 1 tablet (100 mg total) by mouth 2 (two) times daily. 180 tablet 3  . megestrol (MEGACE) 20 MG tablet Take 1 tablet (20 mg total) by mouth 2 (two) times daily. (Patient taking differently: Take 20 mg by mouth 2 (two) times daily as needed. ) 60 tablet 2  . ondansetron (ZOFRAN-ODT) 8 MG disintegrating tablet TAKE 1 TABLET BY MOUTH EVERY 8 HOURS AS NEEDED FOR NAUSEA OR VOMITING 30 tablet 0  . pantoprazole (PROTONIX) 40 MG tablet TAKE 1 TABLET BY MOUTH TWICE A DAY 60 tablet 2  . rizatriptan (MAXALT-MLT) 10 MG disintegrating tablet  Take 1 tablet (10 mg total) by mouth as needed for migraine. May repeat in 2 hours if needed 9 tablet 11  . simvastatin (ZOCOR) 20 MG tablet TAKE 1 TABLET BY MOUTH DAILY 90 tablet 1  . verapamil (CALAN-SR) 240 MG CR tablet TAKE 1 TABLET BY MOUTH DAILY 90 tablet 0   No current facility-administered medications for this visit.    Review of Systems Review of Systems  Gastrointestinal: Positive for nausea.  Neurological: Positive for seizures and headaches.  All other systems reviewed and are negative.   Temperature 98.2 F (36.8 C), height 5\' 5"  (1.651 m), weight 120 lb 9.6 oz (54.7 kg), SpO2 98 %. Body mass index is 20.07 kg/m.  Physical Exam Physical Exam Constitutional:      General: Kayla Shah is not in acute distress.    Appearance: Normal appearance. Kayla Shah is normal weight.  HENT:     Head: Normocephalic and atraumatic.     Nose:     Comments: Covered with a mask    Mouth/Throat:     Comments: Covered with a mask Eyes:     General: No scleral icterus.       Right eye: No discharge.        Left eye: No discharge.     Comments: Wearing glasses  Neck:     Comments: There is no palpable cervical or supraclavicular lymphadenopathy.  The trachea is midline.  There is no thyromegaly or dominant thyroid mass appreciated.  The gland was freely with deglutition. Cardiovascular:     Rate and Rhythm: Normal rate and regular rhythm.     Pulses: Normal pulses.  Pulmonary:     Effort: Pulmonary effort is normal.     Breath sounds: Normal breath sounds.  Abdominal:     Hernia: A hernia is present.       Comments: Surgical scar visible just below the umbilicus.  There is a soft tissue bulge just cranial to the umbilicus.  I am unable to appreciate the fascial edges, but it is tender with any significant manipulation.  Genitourinary:    Comments: Deferred Musculoskeletal:     Right lower leg: No edema.     Left lower leg: No edema.  Skin:    General: Skin is warm and dry.   Neurological:     General: No focal deficit present.     Mental Status: Kayla Shah is alert and oriented to person, place, and time.  Psychiatric:        Mood and Affect: Mood normal.  Behavior: Behavior normal.     Data Reviewed I reviewed the ultrasound performed August 17, 2019, as well as CT scan of the abdomen and pelvis that was performed on May 01, 2019.  Both of these studies do demonstrate the presence of a hernia just above the umbilicus.  I concur with the radiologist's interpretation as copied here:  CLINICAL DATA:  Abdominal wall mass, concern for hernia  EXAM: ULTRASOUND ABDOMEN LIMITED  COMPARISON:  05/01/2019  FINDINGS: Soft tissue ultrasound performed of the area of concern just superior to the umbilicus. The midline abdominal wall abnormality correlates with a small ventral hernia containing only mesenteric fat. Transverse diameter of the hernia defect is 1 cm. The herniated portion of peritoneal fat measures 3.8 x 1.4 x 3.4 cm. No herniation of bowel within the defect. No other significant abnormality by ultrasound.  IMPRESSION: Positive exam for midline fat containing ventral hernia.  Assessment Is a 63 year old woman who has had a small ventral hernia for many years, but it has become more symptomatic over the past couple of months.  On physical examination, it does seem to be incarcerated.  Imaging shows that there is no bowel involvement.  I have offered her a surgical repair.  Plan I discussed the possibility of incarceration, strangulation, enlargement in size over time, and the risk of emergency surgery in the face of strangulation. I also discussed the risks of surgery including injury to adjacent structures, recurrence which can be up to 30% in the case of complex hernias, use of prosthetic materials (mesh) and the increased risk of infection, post-op infection and the possible need for re-operation and removal of mesh if used, possibility of  post-op bowel obstruction or ileus, and the risks of general anesthetic including MI, CVA, sudden death or even reaction to anesthetic medications. The patient and family understands the risks, any and all questions were answered to the patient's satisfaction.    Fredirick Maudlin 08/30/2019, 10:45 AM

## 2019-08-30 NOTE — Telephone Encounter (Signed)
Pt has been advised of Pre-Admission date/time, COVID Testing date and Surgery date.  Surgery Date: 09/16/19 Preadmission Testing Date: 09/12/19 (phone 8a-1p) Covid Testing Date: 09/14/19 - patient advised to go to the [place] [facility] between 8a-1p   Patient has been made aware to call [phone], between 1-3:00pm the day before surgery, to find out what time to arrive for surgery.

## 2019-08-30 NOTE — Telephone Encounter (Signed)
Patient returned call regarding surgery. Please call patient.

## 2019-08-30 NOTE — Progress Notes (Signed)
Patient ID: Kayla Shah, female   DOB: Apr 16, 1955, 64 y.o.   MRN: [id number]  Chief Complaint  Patient presents with  . New Patient (Initial Visit)    Hernia    HPI Kayla Shah is a 64 y.o. female.   She has been referred by her primary care provider, Kayla Roof, PA-C, for evaluation of a ventral hernia.  [name] states that she has had a hernia for 10 to 15 years, but that it did not bother her until 1 to 2 months ago.  She notices bulging and pain with lifting.  She denies any alteration of bowel function.  No nausea or vomiting.  The pain is localized to the area of the hernia and does not radiate.  She has a history of multiple laparoscopic procedures for endometriosis in the past.  She has since undergone a total hysterectomy (1984).  An ultrasound was performed that demonstrated a small, fat-containing hernia just above the umbilicus.   Past Medical History:  Diagnosis Date  . Allergy   . Anxiety    agoraphobia   . Arthritis   . Chicken pox   . COPD (chronic obstructive pulmonary disease) ([place])   . Depression   . GERD (gastroesophageal reflux disease)   . Headache   . History of blood transfusion    1984  . Hyperlipidemia   . Hypertension   . Migraine   . Seizure [facility])    last seizure 04/2019    Past Surgical History:  Procedure Laterality Date  . ABDOMINAL HYSTERECTOMY  1984   Total  . APPENDECTOMY  1984  . COLONOSCOPY WITH PROPOFOL N/A 03/26/2017   Procedure: COLONOSCOPY WITH PROPOFOL;  Surgeon: Jonathon Bellows, MD;  Location: [facility] ENDOSCOPY;  Service: Gastroenterology;  Laterality: N/A;  . WISDOM TOOTH EXTRACTION      Family History  Problem Relation Age of Onset  . Stroke Mother   . Depression Mother   . Arthritis Mother   . Heart disease Father   . Alcohol abuse Father   . Diabetes Father   . Osteoporosis Sister   . Kidney disease Sister   . Arthritis Sister   . Arthritis Maternal Grandmother   . Heart disease Maternal Grandmother   .  Hyperlipidemia Maternal Grandmother   . Diabetes Paternal Grandmother   . Breast cancer Neg Hx     Social History Social History   Tobacco Use  . Smoking status: Former Smoker    Packs/day: 0.25    Years: 45.00    Pack years: 11.25    Types: Cigarettes  . Smokeless tobacco: Never Used  . Tobacco comment: reports quitting in feb 2021  Vaping Use  . Vaping Use: Never used  Substance Use Topics  . Alcohol use: No  . Drug use: Yes    Types: Marijuana    Allergies  Allergen Reactions  . Paroxetine Hcl Other (See Comments)    Pt. Becomes very aggressive with mood swings.  . Codeine Nausea And Vomiting  . Fluoxetine Anxiety  . Paroxetine Anxiety    Current Outpatient Medications  Medication Sig Dispense Refill  . Calcium-Magnesium-Vitamin D (CALCIUM 500 PO) Take 1 tablet by mouth daily.     . cholecalciferol (VITAMIN D) 1000 units tablet Take 1,000 Units by mouth daily.    Marland Kitchen estrogens, conjugated, ([name]) 0.625 MG tablet Take 1 tablet (0.625 mg total) by mouth daily. Take daily for 21 days then do not take for 7 days. 30 tablet 2  . gabapentin (  NEURONTIN) 300 MG capsule TAKE ONE CAPSULE BY MOUTH TWICE A DAY (Patient taking differently: Take 300 mg by mouth 2 (two) times daily. ) 60 capsule 3  . ibuprofen (ADVIL) 200 MG tablet Take 200-600 mg by mouth every 6 (six) hours as needed for fever or mild pain.     Marland Kitchen lamoTRIgine (LAMICTAL) 100 MG tablet Take 1 tablet (100 mg total) by mouth 2 (two) times daily. 180 tablet 3  . megestrol (MEGACE) 20 MG tablet Take 1 tablet (20 mg total) by mouth 2 (two) times daily. (Patient taking differently: Take 20 mg by mouth 2 (two) times daily as needed. ) 60 tablet 2  . ondansetron (ZOFRAN-ODT) 8 MG disintegrating tablet TAKE 1 TABLET BY MOUTH EVERY 8 HOURS AS NEEDED FOR NAUSEA OR VOMITING 30 tablet 0  . pantoprazole (PROTONIX) 40 MG tablet TAKE 1 TABLET BY MOUTH TWICE A DAY 60 tablet 2  . rizatriptan (MAXALT-MLT) 10 MG disintegrating tablet  Take 1 tablet (10 mg total) by mouth as needed for migraine. May repeat in 2 hours if needed 9 tablet 11  . simvastatin (ZOCOR) 20 MG tablet TAKE 1 TABLET BY MOUTH DAILY 90 tablet 1  . verapamil (CALAN-SR) 240 MG CR tablet TAKE 1 TABLET BY MOUTH DAILY 90 tablet 0   No current facility-administered medications for this visit.    Review of Systems Review of Systems  Gastrointestinal: Positive for nausea.  Neurological: Positive for seizures and headaches.  All other systems reviewed and are negative.   Temperature 98.2 F (36.8 C), height 5\' 5"  (1.651 m), weight 120 lb 9.6 oz (54.7 kg), SpO2 98 %. Body mass index is 20.07 kg/m.  Physical Exam Physical Exam Constitutional:      General: She is not in acute distress.    Appearance: Normal appearance. She is normal weight.  HENT:     Head: Normocephalic and atraumatic.     Nose:     Comments: Covered with a mask    Mouth/Throat:     Comments: Covered with a mask Eyes:     General: No scleral icterus.       Right eye: No discharge.        Left eye: No discharge.     Comments: Wearing glasses  Neck:     Comments: There is no palpable cervical or supraclavicular lymphadenopathy.  The trachea is midline.  There is no thyromegaly or dominant thyroid mass appreciated.  The gland was freely with deglutition. Cardiovascular:     Rate and Rhythm: Normal rate and regular rhythm.     Pulses: Normal pulses.  Pulmonary:     Effort: Pulmonary effort is normal.     Breath sounds: Normal breath sounds.  Abdominal:     Hernia: A hernia is present.       Comments: Surgical scar visible just below the umbilicus.  There is a soft tissue bulge just cranial to the umbilicus.  I am unable to appreciate the fascial edges, but it is tender with any significant manipulation.  Genitourinary:    Comments: Deferred Musculoskeletal:     Right lower leg: No edema.     Left lower leg: No edema.  Skin:    General: Skin is warm and dry.    Neurological:     General: No focal deficit present.     Mental Status: She is alert and oriented to person, place, and time.  Psychiatric:        Mood and Affect: Mood normal.  Behavior: Behavior normal.     Data Reviewed I reviewed the ultrasound performed August 17, 2019, as well as CT scan of the abdomen and pelvis that was performed on May 01, 2019.  Both of these studies do demonstrate the presence of a hernia just above the umbilicus.  I concur with the radiologist's interpretation as copied here:  CLINICAL DATA:  Abdominal wall mass, concern for hernia  EXAM: ULTRASOUND ABDOMEN LIMITED  COMPARISON:  05/01/2019  FINDINGS: Soft tissue ultrasound performed of the area of concern just superior to the umbilicus. The midline abdominal wall abnormality correlates with a small ventral hernia containing only mesenteric fat. Transverse diameter of the hernia defect is 1 cm. The herniated portion of peritoneal fat measures 3.8 x 1.4 x 3.4 cm. No herniation of bowel within the defect. No other significant abnormality by ultrasound.  IMPRESSION: Positive exam for midline fat containing ventral hernia.  Assessment Is a 64 year old woman who has had a small ventral hernia for many years, but it has become more symptomatic over the past couple of months.  On physical examination, it does seem to be incarcerated.  Imaging shows that there is no bowel involvement.  I have offered her a surgical repair.  Plan I discussed the possibility of incarceration, strangulation, enlargement in size over time, and the risk of emergency surgery in the face of strangulation. I also discussed the risks of surgery including injury to adjacent structures, recurrence which can be up to 30% in the case of complex hernias, use of prosthetic materials (mesh) and the increased risk of infection, post-op infection and the possible need for re-operation and removal of mesh if used, possibility of  post-op bowel obstruction or ileus, and the risks of general anesthetic including MI, CVA, sudden death or even reaction to anesthetic medications. The patient and family understands the risks, any and all questions were answered to the patient's satisfaction.    Fredirick Maudlin 08/30/2019, 10:45 AM

## 2019-08-30 NOTE — Patient Instructions (Signed)
You have requested for your Umbilical Hernia be repaired. This has been scheduled by Dr. Celine Ahr at [facility].  Please see your (blue)pre-care sheet for information. Our surgery scheduler will be in contact with you to go over surgery dates and information.  You will need to arrange to be off work for 1-2 weeks but will have to have a lifting restriction of no more than 10-15 lbs for 6 weeks following your surgery.   Umbilical Hernia, Adult A hernia is a bulge of tissue that pushes through an opening between muscles. An umbilical hernia happens in the abdomen, near the belly button (umbilicus). The hernia may contain tissues from the small intestine, large intestine, or fatty tissue covering the intestines (omentum). Umbilical hernias in adults tend to get worse over time, and they require surgical treatment. There are several types of umbilical hernias. You may have:  A hernia located just above or below the umbilicus (indirect hernia). This is the most common type of umbilical hernia in adults.  A hernia that forms through an opening formed by the umbilicus (direct hernia).  A hernia that comes and goes (reducible hernia). A reducible hernia may be visible only when you strain, lift something heavy, or cough. This type of hernia can be pushed back into the abdomen (reduced).  A hernia that traps abdominal tissue inside the hernia (incarcerated hernia). This type of hernia cannot be reduced.  A hernia that cuts off blood flow to the tissues inside the hernia (strangulated hernia). The tissues can start to die if this happens. This type of hernia requires emergency treatment.  What are the causes? An umbilical hernia happens when tissue inside the abdomen presses on a weak area of the abdominal muscles. What increases the risk? You may have a greater risk of this condition if you:  Are obese.  Have had several pregnancies.  Have a buildup of fluid inside your  abdomen (ascites).  Have had surgery that weakens the abdominal muscles.  What are the signs or symptoms? The main symptom of this condition is a painless bulge at or near the belly button. A reducible hernia may be visible only when you strain, lift something heavy, or cough. Other symptoms may include:  Dull pain.  A feeling of pressure.  Symptoms of a strangulated hernia may include:  Pain that gets increasingly worse.  Nausea and vomiting.  Pain when pressing on the hernia.  Skin over the hernia becoming red or purple.  Constipation.  Blood in the stool.  How is this diagnosed? This condition may be diagnosed based on:  A physical exam. You may be asked to cough or strain while standing. These actions increase the pressure inside your abdomen and force the hernia through the opening in your muscles. Your health care provider may try to reduce the hernia by pressing on it.  Your symptoms and medical history.  How is this treated? Surgery is the only treatment for an umbilical hernia. Surgery for a strangulated hernia is done as soon as possible. If you have a small hernia that is not incarcerated, you may need to lose weight before having surgery. Follow these instructions at home:  Lose weight, if told by your health care provider.  Do not try to push the hernia back in.  Watch your hernia for any changes in color or size. Tell your health care provider if any changes occur.  You may need to avoid activities that increase pressure on your hernia.  Do not lift anything that is heavier than 10 lb (4.5 kg) until your health care provider says that this is safe.  Take over-the-counter and prescription medicines only as told by your health care provider.  Keep all follow-up visits as told by your health care provider. This is important. Contact a health care provider if:  Your hernia gets larger.  Your hernia becomes painful. Get help right away if:  You develop  sudden, severe pain near the area of your hernia.  You have pain as well as nausea or vomiting.  You have pain and the skin over your hernia changes color.  You develop a fever. This information is not intended to replace advice given to you by your health care provider. Make sure you discuss any questions you have with your health care provider. Document Released: 06/22/2015 Document Revised: 09/23/2015 Document Reviewed: 06/22/2015 Elsevier Interactive Patient Education  [employer].

## 2019-09-12 ENCOUNTER — Encounter
Admission: RE | Admit: 2019-09-12 | Discharge: 2019-09-12 | Disposition: A | Discharge status: Home / Self Care | Payer: Medicare Other | Source: Ambulatory Visit | Attending: General Surgery | Admitting: General Surgery

## 2019-09-12 ENCOUNTER — Other Ambulatory Visit: Payer: Self-pay

## 2019-09-12 HISTORY — DX: Family history of other specified conditions: Z84.89

## 2019-09-12 HISTORY — DX: Other specified postprocedural states: Z98.890

## 2019-09-12 HISTORY — DX: Other specified postprocedural states: R11.2

## 2019-09-12 NOTE — Patient Instructions (Addendum)
Your procedure is scheduled on: 09/16/19 Report to [place]. To find out your arrival time please call [phone] between 1PM - 3PM on 09/15/19 .  Remember: Instructions that are not followed completely may result in serious medical risk, up to and including death, or upon the discretion of your surgeon and anesthesiologist your surgery may need to be rescheduled.     _X__ 1. Do not eat food after midnight the night before your procedure.                 No gum chewing or hard candies. You may drink clear liquids up to 2 hours                 before you are scheduled to arrive for your surgery- DO not drink clear                 liquids within 2 hours of the start of your surgery.                 Clear Liquids include:  water, apple juice without pulp, clear carbohydrate                 drink such as Clearfast or Gatorade, Black Coffee or Tea (Do not add                 anything to coffee or tea). Diabetics water only  __X__2.  On the morning of surgery brush your teeth with toothpaste and water, you                 may rinse your mouth with mouthwash if you wish.  Do not swallow any              toothpaste of mouthwash.     _X__ 3.  No Alcohol for 24 hours before or after surgery.   _X__ 4.  Do Not Smoke or use e-cigarettes For 24 Hours Prior to Your Surgery.                 Do not use any chewable tobacco products for at least 6 hours prior to                 surgery.  ____  5.  Bring all medications with you on the day of surgery if instructed.   __X__  6.  Notify your doctor if there is any change in your medical condition      (cold, fever, infections).     Do not wear jewelry, make-up, hairpins, clips or nail polish. Do not wear lotions, powders, or perfumes.  Do not shave 48 hours prior to surgery. Men may shave face and neck. Do not bring valuables to the hospital.    [facility] is not responsible for any belongings  or valuables.  Contacts, dentures/partials or body piercings may not be worn into surgery. Bring a case for your contacts, glasses or hearing aids, a denture cup will be supplied. Leave your suitcase in the car. After surgery it may be brought to your room. For patients admitted to the hospital, discharge time is determined by your treatment team.   Patients discharged the day of surgery will not be allowed to drive home.   Please read over the following fact sheets that you were given:   MRSA Information  __X__ Take these medicines the morning of surgery with A SIP OF WATER:  1. lamoTRIgine (LAMICTAL) 100 MG tablet  2. pantoprazole (PROTONIX) 40 MG tablet  3. verapamil (CALAN-SR) 240 MG CR tablet  4. gabapentin (NEURONTIN) 300 MG capsule if needed  5.  6.  ____ Fleet Enema (as directed)   __X__ Use CHG Soap/SAGE wipes as directed  ____ Use inhalers on the day of surgery  ____ Stop metformin/Janumet/Farxiga 2 days prior to surgery    ____ Take 1/2 of usual insulin dose the night before surgery. No insulin the morning          of surgery.   ____ Stop Blood Thinners Coumadin/Plavix/Xarelto/Pleta/Pradaxa/Eliquis/Effient/Aspirin  on   Or contact your Surgeon, Cardiologist or Medical Doctor regarding  ability to stop your blood thinners  __X__ Stop Anti-inflammatories 7 days before surgery such as Advil, Ibuprofen, Motrin,  BC or Goodies Powder, Naprosyn, Naproxen, Aleve, Aspirin    __X__ Stop all herbal supplements, fish oil or vitamin E until after surgery.    ____ Bring C-Pap to the hospital.

## 2019-09-14 ENCOUNTER — Other Ambulatory Visit: Payer: Self-pay

## 2019-09-14 ENCOUNTER — Other Ambulatory Visit
Admission: RE | Admit: 2019-09-14 | Discharge: 2019-09-14 | Disposition: A | Discharge status: Home / Self Care | Payer: Medicare Other | Source: Ambulatory Visit | Attending: General Surgery | Admitting: General Surgery

## 2019-09-14 DIAGNOSIS — Z20822 Contact with and (suspected) exposure to covid-19: Secondary | ICD-10-CM | POA: Diagnosis not present

## 2019-09-14 DIAGNOSIS — Z01812 Encounter for preprocedural laboratory examination: Secondary | ICD-10-CM | POA: Diagnosis present

## 2019-09-14 LAB — SARS CORONAVIRUS 2 (TAT 6-24 HRS): SARS Coronavirus 2: NEGATIVE

## 2019-09-15 ENCOUNTER — Ambulatory Visit: Payer: Medicare Other | Admitting: Family Medicine

## 2019-09-16 ENCOUNTER — Encounter: Payer: Self-pay | Admitting: General Surgery

## 2019-09-16 ENCOUNTER — Ambulatory Visit: Payer: Medicare Other | Admitting: Certified Registered"

## 2019-09-16 ENCOUNTER — Ambulatory Visit
Admission: RE | Admit: 2019-09-16 | Discharge: 2019-09-16 | Disposition: A | Discharge status: Home / Self Care | Payer: Medicare Other | Attending: General Surgery | Admitting: General Surgery

## 2019-09-16 ENCOUNTER — Encounter
Admission: RE | Disposition: A | Discharge status: Home / Self Care | Payer: Self-pay | Source: Home / Self Care | Attending: General Surgery

## 2019-09-16 ENCOUNTER — Other Ambulatory Visit: Payer: Self-pay

## 2019-09-16 DIAGNOSIS — G43909 Migraine, unspecified, not intractable, without status migrainosus: Secondary | ICD-10-CM | POA: Diagnosis not present

## 2019-09-16 DIAGNOSIS — K439 Ventral hernia without obstruction or gangrene: Secondary | ICD-10-CM | POA: Diagnosis not present

## 2019-09-16 DIAGNOSIS — K436 Other and unspecified ventral hernia with obstruction, without gangrene: Secondary | ICD-10-CM | POA: Diagnosis present

## 2019-09-16 DIAGNOSIS — J449 Chronic obstructive pulmonary disease, unspecified: Secondary | ICD-10-CM | POA: Insufficient documentation

## 2019-09-16 DIAGNOSIS — I2510 Atherosclerotic heart disease of native coronary artery without angina pectoris: Secondary | ICD-10-CM | POA: Insufficient documentation

## 2019-09-16 DIAGNOSIS — R569 Unspecified convulsions: Secondary | ICD-10-CM | POA: Diagnosis not present

## 2019-09-16 DIAGNOSIS — Z87891 Personal history of nicotine dependence: Secondary | ICD-10-CM | POA: Insufficient documentation

## 2019-09-16 DIAGNOSIS — Z79890 Hormone replacement therapy (postmenopausal): Secondary | ICD-10-CM | POA: Insufficient documentation

## 2019-09-16 DIAGNOSIS — K219 Gastro-esophageal reflux disease without esophagitis: Secondary | ICD-10-CM | POA: Diagnosis not present

## 2019-09-16 DIAGNOSIS — I10 Essential (primary) hypertension: Secondary | ICD-10-CM | POA: Insufficient documentation

## 2019-09-16 DIAGNOSIS — Z79899 Other long term (current) drug therapy: Secondary | ICD-10-CM | POA: Diagnosis not present

## 2019-09-16 DIAGNOSIS — M1990 Unspecified osteoarthritis, unspecified site: Secondary | ICD-10-CM | POA: Diagnosis not present

## 2019-09-16 HISTORY — PX: VENTRAL HERNIA REPAIR: SHX424

## 2019-09-16 LAB — URINE DRUG SCREEN, QUALITATIVE (ARMC ONLY)
Amphetamines, Ur Screen: NOT DETECTED
Barbiturates, Ur Screen: NOT DETECTED
Benzodiazepine, Ur Scrn: NOT DETECTED
Cannabinoid 50 Ng, Ur ~~LOC~~: POSITIVE — AB
Cocaine Metabolite,Ur ~~LOC~~: NOT DETECTED
MDMA (Ecstasy)Ur Screen: NOT DETECTED
Methadone Scn, Ur: NOT DETECTED
Opiate, Ur Screen: NOT DETECTED
Phencyclidine (PCP) Ur S: NOT DETECTED
Tricyclic, Ur Screen: NOT DETECTED

## 2019-09-16 SURGERY — REPAIR, HERNIA, VENTRAL
Anesthesia: General

## 2019-09-16 MED ORDER — BUPIVACAINE LIPOSOME 1.3 % IJ SUSP: 20.0000 mL | Freq: Once | INTRAMUSCULAR | Status: DC

## 2019-09-16 MED ORDER — ONDANSETRON HCL 4 MG/2ML IJ SOLN
INTRAMUSCULAR | Status: AC
  Filled 2019-09-16: qty 2

## 2019-09-16 MED ORDER — ORAL CARE MOUTH RINSE: 15.0000 mL | Freq: Once | OROMUCOSAL | Status: AC

## 2019-09-16 MED ORDER — CELECOXIB 200 MG PO CAPS
ORAL_CAPSULE | ORAL | Status: AC
  Administered 2019-09-16: 200 mg via ORAL
  Filled 2019-09-16: qty 1

## 2019-09-16 MED ORDER — SCOPOLAMINE 1 MG/3DAYS TD PT72: 1.0000 | MEDICATED_PATCH | TRANSDERMAL | Status: DC

## 2019-09-16 MED ORDER — PROPOFOL 10 MG/ML IV BOLUS
INTRAVENOUS | Status: AC
  Filled 2019-09-16: qty 20

## 2019-09-16 MED ORDER — CHLORHEXIDINE GLUCONATE CLOTH 2 % EX PADS
6.0000 | MEDICATED_PAD | Freq: Once | CUTANEOUS | Status: AC
  Administered 2019-09-16: 6 via TOPICAL

## 2019-09-16 MED ORDER — PROPOFOL 10 MG/ML IV BOLUS
INTRAVENOUS | Status: DC | PRN
  Administered 2019-09-16: 150 mg via INTRAVENOUS

## 2019-09-16 MED ORDER — EPHEDRINE SULFATE 50 MG/ML IJ SOLN
INTRAMUSCULAR | Status: DC | PRN
  Administered 2019-09-16 (×2): 25 mg via INTRAVENOUS

## 2019-09-16 MED ORDER — SUCCINYLCHOLINE CHLORIDE 200 MG/10ML IV SOSY
PREFILLED_SYRINGE | INTRAVENOUS | Status: AC
  Filled 2019-09-16: qty 10

## 2019-09-16 MED ORDER — FENTANYL CITRATE (PF) 250 MCG/5ML IJ SOLN
INTRAMUSCULAR | Status: AC
  Filled 2019-09-16: qty 5

## 2019-09-16 MED ORDER — DEXAMETHASONE SODIUM PHOSPHATE 10 MG/ML IJ SOLN
INTRAMUSCULAR | Status: AC
  Filled 2019-09-16: qty 1

## 2019-09-16 MED ORDER — CEFAZOLIN SODIUM-DEXTROSE 2-4 GM/100ML-% IV SOLN
2.0000 g | INTRAVENOUS | Status: AC
  Administered 2019-09-16: 2 g via INTRAVENOUS

## 2019-09-16 MED ORDER — FENTANYL CITRATE (PF) 100 MCG/2ML IJ SOLN
25.0000 ug | INTRAMUSCULAR | Status: AC | PRN
  Administered 2019-09-16 (×7): 25 ug via INTRAVENOUS

## 2019-09-16 MED ORDER — ACETAMINOPHEN 500 MG PO TABS
ORAL_TABLET | ORAL | Status: AC
  Filled 2019-09-16: qty 2

## 2019-09-16 MED ORDER — IBUPROFEN 200 MG PO TABS: 600.0000 mg | ORAL_TABLET | Freq: Four times a day (QID) | ORAL | 0 refills | Status: DC | PRN

## 2019-09-16 MED ORDER — FENTANYL CITRATE (PF) 100 MCG/2ML IJ SOLN
INTRAMUSCULAR | Status: AC
  Administered 2019-09-16: 25 ug via INTRAVENOUS
  Filled 2019-09-16: qty 2

## 2019-09-16 MED ORDER — ONDANSETRON HCL 4 MG/2ML IJ SOLN
4.0000 mg | Freq: Once | INTRAMUSCULAR | Status: AC | PRN
  Administered 2019-09-16: 4 mg via INTRAVENOUS

## 2019-09-16 MED ORDER — SUGAMMADEX SODIUM 200 MG/2ML IV SOLN
INTRAVENOUS | Status: DC | PRN
  Administered 2019-09-16: 200 mg via INTRAVENOUS

## 2019-09-16 MED ORDER — ACETAMINOPHEN 500 MG PO TABS
1000.0000 mg | ORAL_TABLET | ORAL | Status: AC
  Administered 2019-09-16: 1000 mg via ORAL

## 2019-09-16 MED ORDER — MIDAZOLAM HCL 2 MG/2ML IJ SOLN
INTRAMUSCULAR | Status: DC | PRN
  Administered 2019-09-16: 2 mg via INTRAVENOUS

## 2019-09-16 MED ORDER — BUPIVACAINE HCL (PF) 0.25 % IJ SOLN
INTRAMUSCULAR | Status: AC
  Filled 2019-09-16: qty 30

## 2019-09-16 MED ORDER — PHENYLEPHRINE HCL (PRESSORS) 10 MG/ML IV SOLN
INTRAVENOUS | Status: DC | PRN
  Administered 2019-09-16: 200 ug via INTRAVENOUS

## 2019-09-16 MED ORDER — SUCCINYLCHOLINE CHLORIDE 20 MG/ML IJ SOLN
INTRAMUSCULAR | Status: DC | PRN
  Administered 2019-09-16: 100 mg via INTRAVENOUS

## 2019-09-16 MED ORDER — LIDOCAINE-EPINEPHRINE 1 %-1:100000 IJ SOLN
INTRAMUSCULAR | Status: AC
  Filled 2019-09-16: qty 1

## 2019-09-16 MED ORDER — FENTANYL CITRATE (PF) 100 MCG/2ML IJ SOLN
INTRAMUSCULAR | Status: AC
  Filled 2019-09-16: qty 2

## 2019-09-16 MED ORDER — APREPITANT 40 MG PO CAPS
40.0000 mg | ORAL_CAPSULE | Freq: Once | ORAL | Status: AC
  Administered 2019-09-16: 40 mg via ORAL
  Filled 2019-09-16 (×2): qty 1

## 2019-09-16 MED ORDER — SCOPOLAMINE 1 MG/3DAYS TD PT72
MEDICATED_PATCH | TRANSDERMAL | Status: AC
  Administered 2019-09-16: 1.5 mg via TRANSDERMAL
  Filled 2019-09-16: qty 1

## 2019-09-16 MED ORDER — DEXAMETHASONE SODIUM PHOSPHATE 10 MG/ML IJ SOLN
INTRAMUSCULAR | Status: DC | PRN
  Administered 2019-09-16: 10 mg via INTRAVENOUS

## 2019-09-16 MED ORDER — HYDROCODONE-ACETAMINOPHEN 5-325 MG PO TABS: 1.0000 | ORAL_TABLET | Freq: Four times a day (QID) | ORAL | 0 refills | Status: DC | PRN

## 2019-09-16 MED ORDER — LIDOCAINE-EPINEPHRINE 1 %-1:100000 IJ SOLN
INTRAMUSCULAR | Status: DC | PRN
  Administered 2019-09-16: 10 mL

## 2019-09-16 MED ORDER — ONDANSETRON HCL 4 MG/2ML IJ SOLN
INTRAMUSCULAR | Status: DC | PRN
  Administered 2019-09-16: 4 mg via INTRAVENOUS

## 2019-09-16 MED ORDER — SEVOFLURANE IN SOLN
RESPIRATORY_TRACT | Status: AC
  Filled 2019-09-16: qty 250

## 2019-09-16 MED ORDER — HYDROCODONE-ACETAMINOPHEN 5-325 MG PO TABS
1.0000 | ORAL_TABLET | Freq: Once | ORAL | Status: AC
  Administered 2019-09-16: 1 via ORAL

## 2019-09-16 MED ORDER — HYDROCODONE-ACETAMINOPHEN 5-325 MG PO TABS
ORAL_TABLET | ORAL | Status: AC
  Filled 2019-09-16: qty 1

## 2019-09-16 MED ORDER — CHLORHEXIDINE GLUCONATE 0.12 % MT SOLN
OROMUCOSAL | Status: AC
  Administered 2019-09-16: 15 mL via OROMUCOSAL
  Filled 2019-09-16: qty 15

## 2019-09-16 MED ORDER — LACTATED RINGERS IV SOLN: INTRAVENOUS | Status: DC

## 2019-09-16 MED ORDER — LIDOCAINE HCL (CARDIAC) PF 100 MG/5ML IV SOSY
PREFILLED_SYRINGE | INTRAVENOUS | Status: DC | PRN
  Administered 2019-09-16: 50 mg via INTRAVENOUS

## 2019-09-16 MED ORDER — CHLORHEXIDINE GLUCONATE 0.12 % MT SOLN: 15.0000 mL | Freq: Once | OROMUCOSAL | Status: AC

## 2019-09-16 MED ORDER — CELECOXIB 200 MG PO CAPS: 200.0000 mg | ORAL_CAPSULE | ORAL | Status: AC

## 2019-09-16 MED ORDER — ROCURONIUM BROMIDE 10 MG/ML (PF) SYRINGE
PREFILLED_SYRINGE | INTRAVENOUS | Status: AC
  Filled 2019-09-16: qty 10

## 2019-09-16 MED ORDER — MIDAZOLAM HCL 2 MG/2ML IJ SOLN
INTRAMUSCULAR | Status: AC
  Filled 2019-09-16: qty 2

## 2019-09-16 MED ORDER — BUPIVACAINE LIPOSOME 1.3 % IJ SUSP
INTRAMUSCULAR | Status: DC | PRN
  Administered 2019-09-16: 20 mL

## 2019-09-16 MED ORDER — LIDOCAINE HCL (PF) 2 % IJ SOLN
INTRAMUSCULAR | Status: AC
  Filled 2019-09-16: qty 5

## 2019-09-16 MED ORDER — ROCURONIUM BROMIDE 100 MG/10ML IV SOLN
INTRAVENOUS | Status: DC | PRN
  Administered 2019-09-16: 50 mg via INTRAVENOUS

## 2019-09-16 MED ORDER — FENTANYL CITRATE (PF) 100 MCG/2ML IJ SOLN
INTRAMUSCULAR | Status: DC | PRN
  Administered 2019-09-16: 100 ug via INTRAVENOUS
  Administered 2019-09-16: 50 ug via INTRAVENOUS
  Administered 2019-09-16: 100 ug via INTRAVENOUS

## 2019-09-16 MED ORDER — CEFAZOLIN SODIUM-DEXTROSE 2-4 GM/100ML-% IV SOLN
INTRAVENOUS | Status: AC
  Filled 2019-09-16: qty 100

## 2019-09-16 SURGICAL SUPPLY — 34 items
ADH SKN CLS APL DERMABOND .7 (GAUZE/BANDAGES/DRESSINGS) ×1
APL PRP STRL LF DISP 70% ISPRP (MISCELLANEOUS) ×1
BLADE SURG 15 STRL LF DISP TIS (BLADE) ×1 IMPLANT
BLADE SURG 15 STRL SS (BLADE) ×2
CANISTER SUCT 1200ML W/VALVE (MISCELLANEOUS) ×2 IMPLANT
CHLORAPREP W/TINT 26 (MISCELLANEOUS) ×2 IMPLANT
COVER WAND RF STERILE (DRAPES) ×2 IMPLANT
DERMABOND ADVANCED (GAUZE/BANDAGES/DRESSINGS) ×1
DERMABOND ADVANCED .7 DNX12 (GAUZE/BANDAGES/DRESSINGS) ×1 IMPLANT
DRAIN PENROSE 5/8X18 LTX STRL (WOUND CARE) ×2 IMPLANT
DRAPE LAPAROTOMY 77X122 PED (DRAPES) ×2 IMPLANT
ELECT CAUTERY BLADE TIP 2.5 (TIP) ×2
ELECT REM PT RETURN 9FT ADLT (ELECTROSURGICAL) ×2
ELECTRODE CAUTERY BLDE TIP 2.5 (TIP) ×1 IMPLANT
ELECTRODE REM PT RTRN 9FT ADLT (ELECTROSURGICAL) ×1 IMPLANT
GLOVE BIO SURGEON STRL SZ 6.5 (GLOVE) ×2 IMPLANT
GLOVE INDICATOR 7.0 STRL GRN (GLOVE) ×4 IMPLANT
GOWN STRL REUS W/ TWL LRG LVL3 (GOWN DISPOSABLE) ×2 IMPLANT
GOWN STRL REUS W/TWL LRG LVL3 (GOWN DISPOSABLE) ×4
KIT TURNOVER KIT A (KITS) ×2 IMPLANT
LABEL OR SOLS (LABEL) ×2 IMPLANT
NEEDLE HYPO 22GX1.5 SAFETY (NEEDLE) ×2 IMPLANT
NS IRRIG 500ML POUR BTL (IV SOLUTION) ×2 IMPLANT
PACK BASIN MINOR (MISCELLANEOUS) ×2 IMPLANT
STRIP CLOSURE SKIN 1/2X4 (GAUZE/BANDAGES/DRESSINGS) ×2 IMPLANT
SUT ETHIBOND NAB MO 7 #0 18IN (SUTURE) IMPLANT
SUT MNCRL 4-0 (SUTURE) ×2
SUT MNCRL 4-0 27XMFL (SUTURE) ×1
SUT VIC AB 3-0 SH 27 (SUTURE) ×2
SUT VIC AB 3-0 SH 27X BRD (SUTURE) ×1 IMPLANT
SUT VICRYL 2-0 SH 8X27 (SUTURE) ×2 IMPLANT
SUTURE MNCRL 4-0 27XMF (SUTURE) ×1 IMPLANT
SYR 10ML LL (SYRINGE) ×2 IMPLANT
SYR BULB IRRIG 60ML STRL (SYRINGE) ×2 IMPLANT

## 2019-09-16 NOTE — Anesthesia Procedure Notes (Signed)
Procedure Name: Intubation Performed by: Gaynelle Cage, CRNA Pre-anesthesia Checklist: Patient identified, Emergency Drugs available, Suction available and Patient being monitored Patient Re-evaluated:Patient Re-evaluated prior to induction Oxygen Delivery Method: Circle system utilized Preoxygenation: Pre-oxygenation with 100% oxygen Induction Type: IV induction and Rapid sequence Laryngoscope Size: Mac and 3 Grade View: Grade II Tube type: Oral Tube size: 7.0 mm Number of attempts: 1 Placement Confirmation: ETT inserted through vocal cords under direct vision,  positive ETCO2 and breath sounds checked- equal and bilateral Secured at: 21 cm Tube secured with: Tape Dental Injury: Teeth and Oropharynx as per pre-operative assessment

## 2019-09-16 NOTE — Anesthesia Preprocedure Evaluation (Signed)
Anesthesia Evaluation  Patient identified by MRN, date of birth, ID band Patient awake    Reviewed: Allergy & Precautions, H&P , NPO status , Patient's Chart, lab work & pertinent test results, reviewed documented beta blocker date and time   History of Anesthesia Complications (+) PONV, Family history of anesthesia reaction and history of anesthetic complications  Airway Mallampati: II  TM Distance: >3 FB Neck ROM: full    Dental  (+) Teeth Intact   Pulmonary COPD,  COPD inhaler, former smoker,    Pulmonary exam normal        Cardiovascular Exercise Tolerance: Good hypertension, On Medications + CAD  Normal cardiovascular exam Rhythm:regular Rate:Normal     Neuro/Psych  Headaches, Seizures -, Well Controlled,  PSYCHIATRIC DISORDERS Anxiety Depression    GI/Hepatic Neg liver ROS, GERD  Medicated,  Endo/Other  negative endocrine ROS  Renal/GU negative Renal ROS  negative genitourinary   Musculoskeletal   Abdominal   Peds  Hematology negative hematology ROS (+)   Anesthesia Other Findings Past Medical History: No date: Allergy No date: Anxiety     Comment:  agoraphobia  No date: Arthritis No date: Chicken pox No date: COPD (chronic obstructive pulmonary disease) ([facility]) No date: Depression No date: Family history of adverse reaction to anesthesia     Comment:  PONV No date: GERD (gastroesophageal reflux disease) No date: Headache No date: History of blood transfusion     Comment:  1984 No date: Hyperlipidemia No date: Hypertension No date: Migraine No date: PONV (postoperative nausea and vomiting) No date: Seizure [facility])     Comment:  last seizure 04/2019 Past Surgical History: 1984: ABDOMINAL HYSTERECTOMY     Comment:  Total 1984: APPENDECTOMY 03/26/2017: COLONOSCOPY WITH PROPOFOL; N/A     Comment:  Procedure: COLONOSCOPY WITH PROPOFOL;  Surgeon: Jonathon Bellows, MD;  Location: [facility] ENDOSCOPY;   Service:               Gastroenterology;  Laterality: N/A; No date: WISDOM TOOTH EXTRACTION BMI    Body Mass Index: 19.81 kg/m     Reproductive/Obstetrics negative OB ROS                             Anesthesia Physical Anesthesia Plan  ASA: III  Anesthesia Plan: General ETT   Post-op Pain Management:    Induction:   PONV Risk Score and Plan:   Airway Management Planned:   Additional Equipment:   Intra-op Plan:   Post-operative Plan:   Informed Consent: I have reviewed the patients History and Physical, chart, labs and discussed the procedure including the risks, benefits and alternatives for the proposed anesthesia with the patient or authorized representative who has indicated his/her understanding and acceptance.     Dental Advisory Given  Plan Discussed with: CRNA  Anesthesia Plan Comments:         Anesthesia Quick Evaluation

## 2019-09-16 NOTE — Op Note (Signed)
Operative Note  Pre-operative Diagnosis: Ventral hernia  Post-operative Diagnosis: same  Operation: Ventral hernia repair without mesh  Surgeon: Fredirick Maudlin, MD  Anesthesia: GETA  Assistant: None  Findings: There was preperitoneal fat incarcerated in a roughly 0.5 cm fascial defect.  Estimated Blood Loss: Less than 5 cc         Drains: None         Specimens: None          Complications: None immediately apparent         Condition: stable  Procedure Details  The patient was identified in the preoperative holding area. The benefits, complications, treatment options, and expected outcomes were discussed with the patient. The risks of bleeding, infection, recurrence of symptoms, failure to resolve symptoms, bowel injury, any of which could require further surgery were reviewed with the patient. The patient agreed to accept these risks. The patient was then taken to the operating room, identified as Kayla Shah and the procedure verified.  A time out was performed and the above information confirmed.  Prior to the induction of general anesthesia, antibiotic prophylaxis was administered. VTE prophylaxis was in place. General endotracheal anesthesia was then administered and tolerated well. After induction, the abdomen was prepped with Chloraprep and draped in standard sterile fashion. The patient was positioned in the supine position.  The skin and subcutaneous tissue overlying the hernia was infiltrated with a one-to-one mixture of 0.25% bupivacaine and 1% lidocaine with epinephrine.  A small vertical midline incision was created over the hernia sac and electrocautery was used to dissect through subcutaneous tissue. The hernia was dissected free from adjacent tissue and fascia. The hernia sac was entered and the sac was excised. Care was taken to avoid any injury to the bowel.  Due to the size of the herniated fat mass, it was excised as it was unable to be reduced through the  hernia defect.  The defect itself was slightly less than 0.5 cm and thus it was closed primarily with interrupted Ethibond sutures.  Liposomal bupivacaine was infiltrated along the fascial plane.  The cutaneous tissue was closed with 3-0 Vicryl and skin was closed with 4-0 Monocryl in a subcuticular fashion. Dermabond was applied to the skin, followed by Steri-Strips. Patient tolerated procedure well and there were no immediate complications identified. Needle, instrument, and sponge counts were reported to be correct by the nursing staff.  Fredirick Maudlin, MD FACS

## 2019-09-16 NOTE — Interval H&P Note (Signed)
History and Physical Interval Note:  09/16/2019 7:15 AM  Kayla Shah  has presented today for surgery, with the diagnosis of Ventral hernia.  The various methods of treatment have been discussed with the patient and family. After consideration of risks, benefits and other options for treatment, the patient has consented to  Procedure(s): HERNIA REPAIR VENTRAL ADULT, Open (N/A) as a surgical intervention.  The patient's history has been reviewed, patient examined, no change in status, stable for surgery.  I have reviewed the patient's chart and labs.  Questions were answered to the patient's satisfaction.     Fredirick Maudlin

## 2019-09-16 NOTE — Transfer of Care (Signed)
Immediate Anesthesia Transfer of Care Note  Patient: Rokia Bosket [facility]  Procedure(s) Performed: HERNIA REPAIR VENTRAL ADULT, Open (N/A )  Patient Location: PACU  Anesthesia Type:General  Level of Consciousness: awake and drowsy  Airway & Oxygen Therapy: Patient Spontanous Breathing and Patient connected to face mask oxygen  Post-op Assessment: Report given to RN and Post -op Vital signs reviewed and stable  Post vital signs: Reviewed and stable  Last Vitals:  Vitals Value Taken Time  BP 104/54 09/16/19 0828  Temp    Pulse 96 09/16/19 0830  Resp    SpO2 98 % 09/16/19 0830  Vitals shown include unvalidated device data.  Last Pain:  Vitals:   09/16/19 0618  PainSc: 0-No pain         Complications: No complications documented.

## 2019-09-16 NOTE — Discharge Instructions (Addendum)
Bupivacaine Liposomal Suspension for Injection What is this medicine? BUPIVACAINE LIPOSOMAL (bue PIV a kane LIP oh som al) is an anesthetic. It causes loss of feeling in the skin or other tissues. It is used to prevent and to treat pain from some procedures. This medicine may be used for other purposes; ask your health care provider or pharmacist if you have questions. COMMON BRAND NAME(S): EXPAREL What should I tell my health care provider before I take this medicine? They need to know if you have any of these conditions:  G6PD deficiency  heart disease  kidney disease  liver disease  low blood pressure  lung or breathing disease, like asthma  an unusual or allergic reaction to bupivacaine, other medicines, foods, dyes, or preservatives  pregnant or trying to get pregnant  breast-feeding How should I use this medicine? This medicine is for injection into the affected area. It is given by a health care professional in a hospital or clinic setting. Talk to your pediatrician regarding the use of this medicine in children. Special care may be needed. Overdosage: If you think you have taken too much of this medicine contact a poison control center or emergency room at once. NOTE: This medicine is only for you. Do not share this medicine with others. What if I miss a dose? This does not apply. What may interact with this medicine? This medicine may interact with the following medications:  acetaminophen  certain antibiotics like dapsone, nitrofurantoin, aminosalicylic acid, sulfonamides  certain medicines for seizures like phenobarbital, phenytoin, valproic acid  chloroquine  cyclophosphamide  flutamide  hydroxyurea  ifosfamide  metoclopramide  nitric oxide  nitroglycerin  nitroprusside  nitrous oxide  other local anesthetics like lidocaine, pramoxine, tetracaine  primaquine  quinine  rasburicase  sulfasalazine This list may not describe all possible  interactions. Give your health care provider a list of all the medicines, herbs, non-prescription drugs, or dietary supplements you use. Also tell them if you smoke, drink alcohol, or use illegal drugs. Some items may interact with your medicine. What should I watch for while using this medicine? Your condition will be monitored carefully while you are receiving this medicine. Be careful to avoid injury while the area is numb, and you are not aware of pain. What side effects may I notice from receiving this medicine? Side effects that you should report to your doctor or health care professional as soon as possible:  allergic reactions like skin rash, itching or hives, swelling of the face, lips, or tongue  seizures  signs and symptoms of a dangerous change in heartbeat or heart rhythm like chest pain; dizziness; fast, irregular heartbeat; palpitations; feeling faint or lightheaded; falls; breathing problems  signs and symptoms of methemoglobinemia such as pale, gray, or blue colored skin; headache; fast heartbeat; shortness of breath; feeling faint or lightheaded, falls; tiredness Side effects that usually do not require medical attention (report to your doctor or health care professional if they continue or are bothersome):  anxious  back pain  changes in taste  changes in vision  constipation  dizziness  fever  nausea, vomiting This list may not describe all possible side effects. Call your doctor for medical advice about side effects. You may report side effects to FDA at 1-800-FDA-1088. Where should I keep my medicine? This drug is given in a hospital or clinic and will not be stored at home. NOTE: This sheet is a summary. It may not cover all possible information. If you have questions about this  medicine, talk to your doctor, pharmacist, or health care provider.  2020 Elsevier/Gold Standard (2018-11-02 10:48:23)      AMBULATORY SURGERY  DISCHARGE  INSTRUCTIONS   1) The drugs that you were given will stay in your system until tomorrow so for the next 24 hours you should not:  A) Drive an automobile B) Make any legal decisions C) Drink any alcoholic beverage   2) You may resume regular meals tomorrow.  Today it is better to start with liquids and gradually work up to solid foods.  You may eat anything you prefer, but it is better to start with liquids, then soup and crackers, and gradually work up to solid foods.   3) Please notify your doctor immediately if you have any unusual bleeding, trouble breathing, redness and pain at the surgery site, drainage, fever, or pain not relieved by medication.    4) Additional Instructions:        Please contact your physician with any problems or Same Day Surgery at [phone], Monday through Friday 6 am to 4 pm, or [place] at [facility] number at [phone].   Open Hernia Repair, Adult, Care After This sheet gives you information about how to care for yourself after your procedure. Your health care provider may also give you more specific instructions. If you have problems or questions, contact your health care provider. What can I expect after the procedure? After the procedure, it is common to have:  Mild discomfort.  Slight bruising.  Minor swelling.  Pain in the abdomen. Follow these instructions at home: Incision care   Follow instructions from your health care provider about how to take care of your incision area. Make sure you: ? Wash your hands with soap and water before you change your bandage (dressing). If soap and water are not available, use hand sanitizer. ? Change your dressing as told by your health care provider. ? Leave stitches (sutures), skin glue, or adhesive strips in place. These skin closures may need to stay in place for 2 weeks or longer. If adhesive strip edges start to loosen and curl up, you may trim the loose edges. Do not remove  adhesive strips completely unless your health care provider tells you to do that.  Check your incision area every day for signs of infection. Check for: ? More redness, swelling, or pain. ? More fluid or blood. ? Warmth. ? Pus or a bad smell. Activity  Do not drive or use heavy machinery while taking prescription pain medicine. Do not drive until your health care provider approves.  Until your health care provider approves: ? Do not lift anything that is heavier than 10 lb (4.5 kg). ? Do not play contact sports.  Return to your normal activities as told by your health care provider. Ask your health care provider what activities are safe. General instructions  To prevent or treat constipation while you are taking prescription pain medicine, your health care provider may recommend that you: ? Drink enough fluid to keep your urine clear or pale yellow. ? Take over-the-counter or prescription medicines. ? Eat foods that are high in fiber, such as fresh fruits and vegetables, whole grains, and beans. ? Limit foods that are high in fat and processed sugars, such as fried and sweet foods.  Take over-the-counter and prescription medicines only as told by your health care provider.  Do not take tub baths or go swimming until your health care provider approves.  Keep all follow-up visits as  told by your health care provider. This is important. Contact a health care provider if:  You develop a rash.  You have more redness, swelling, or pain around your incision.  You have more fluid or blood coming from your incision.  Your incision feels warm to the touch.  You have pus or a bad smell coming from your incision.  You have a fever or chills.  You have blood in your stool (feces).  You have not had a bowel movement in 2-3 days.  Your pain is not controlled with medicine. Get help right away if:  You have chest pain or shortness of breath.  You feel light-headed or feel  faint.  You have severe pain.  You vomit and your pain is worse. This information is not intended to replace advice given to you by your health care provider. Make sure you discuss any questions you have with your health care provider. Document Revised: 01/02/2017 Document Reviewed: 07/04/2015 Elsevier Patient Education  2020 [employer].

## 2019-09-19 ENCOUNTER — Encounter: Payer: Self-pay | Admitting: General Surgery

## 2019-09-20 ENCOUNTER — Ambulatory Visit: Payer: Medicare Other | Admitting: Family Medicine

## 2019-09-20 ENCOUNTER — Encounter: Payer: Self-pay | Admitting: Family Medicine

## 2019-09-20 ENCOUNTER — Other Ambulatory Visit: Payer: Self-pay

## 2019-09-20 VITALS — BP 134/80 | HR 55 | Ht 65.0 in | Wt 121.4 lb

## 2019-09-20 DIAGNOSIS — R569 Unspecified convulsions: Secondary | ICD-10-CM

## 2019-09-20 DIAGNOSIS — G43009 Migraine without aura, not intractable, without status migrainosus: Secondary | ICD-10-CM

## 2019-09-20 NOTE — Patient Instructions (Addendum)
We will continue lamotrigine 100mg  twice daily. We will continue rizatriptan as needed for migraines.   Follow up with me in 1 year, sooner   Seizure, Adult A seizure is a sudden burst of abnormal electrical activity in the brain. Seizures usually last from 30 seconds to 2 minutes. They can cause many different symptoms. Usually, seizures are not harmful unless they last a long time. What are the causes? Common causes of this condition include:  Fever or infection.  Conditions that affect the brain, such as: ? A brain abnormality that you were born with. ? A brain or head injury. ? Bleeding in the brain. ? A tumor. ? Stroke. ? Brain disorders such as autism or cerebral palsy.  Low blood sugar.  Conditions that are passed from parent to child (are inherited).  Problems with substances, such as: ? Having a reaction to a drug or a medicine. ? Suddenly stopping the use of a substance (withdrawal). In some cases, the cause may not be known. A person who has repeated seizures over time without a clear cause has a condition called epilepsy. What increases the risk? You are more likely to get this condition if you have:  A family history of epilepsy.  Had a seizure in the past.  A brain disorder.  A history of head injury, lack of oxygen at birth, or strokes. What are the signs or symptoms? There are many types of seizures. The symptoms vary depending on the type of seizure you have. Examples of symptoms during a seizure include:  Shaking (convulsions).  Stiffness in the body.  Passing out (losing consciousness).  Head nodding.  Staring.  Not responding to sound or touch.  Loss of bladder control and bowel control. Some people have symptoms right before and right after a seizure happens. Symptoms before a seizure may include:  Fear.  Worry (anxiety).  Feeling like you may vomit (nauseous).  Feeling like the room is spinning (vertigo).  Feeling like you saw  or heard something before (dj vu).  Odd tastes or smells.  Changes in how you see. You may see flashing lights or spots. Symptoms after a seizure happens can include:  Confusion.  Sleepiness.  Headache.  Weakness on one side of the body. How is this treated? Most seizures will stop on their own in under 5 minutes. In these cases, no treatment is needed. Seizures that last longer than 5 minutes will usually need treatment. Treatment can include:  Medicines given through an IV tube.  Avoiding things that are known to cause your seizures. These can include medicines that you take for another condition.  Medicines to treat epilepsy.  Surgery to stop the seizures. This may be needed if medicines do not help. Follow these instructions at home: Medicines  Take over-the-counter and prescription medicines only as told by your doctor.  Do not eat or drink anything that may keep your medicine from working, such as alcohol. Activity  Do not do any activities that would be dangerous if you had another seizure, like driving or swimming. Wait until your doctor says it is safe for you to do them.  If you live in the [place] ask your local DMV (department of motor vehicles) when you can drive.  Get plenty of rest. Teaching others Teach friends and family what to do when you have a seizure. They should:  Lay you on the ground.  Protect your head and body.  Loosen any tight clothing around your neck.  Turn  you on your side.  Not hold you down.  Not put anything into your mouth.  Know whether or not you need emergency care.  Stay with you until you are better.  General instructions  Contact your doctor each time you have a seizure.  Avoid anything that gives you seizures.  Keep a seizure diary. Write down: ? What you think caused each seizure. ? What you remember about each seizure.  Keep all follow-up visits as told by your doctor. This is important. Contact a doctor  if:  You have another seizure.  You have seizures more often.  There is any change in what happens during your seizures.  You keep having seizures with treatment.  You have symptoms of being sick or having an infection. Get help right away if:  You have a seizure that: ? Lasts longer than 5 minutes. ? Is different than seizures you had before. ? Makes it harder to breathe. ? Happens after you hurt your head.  You have any of these symptoms after a seizure: ? Not being able to speak. ? Not being able to use a part of your body. ? Confusion. ? A bad headache.  You have two or more seizures in a row.  You do not wake up right after a seizure.  You get hurt during a seizure. These symptoms may be an emergency. Do not wait to see if the symptoms will go away. Get medical help right away. Call your local emergency services (911 in the [place] Do not drive yourself to the hospital. Summary  Seizures usually last from 30 seconds to 2 minutes. Usually, they are not harmful unless they last a long time.  Do not eat or drink anything that may keep your medicine from working, such as alcohol.  Teach friends and family what to do when you have a seizure.  Contact your doctor each time you have a seizure. This information is not intended to replace advice given to you by your health care provider. Make sure you discuss any questions you have with your health care provider. Document Revised: 04/09/2018 Document Reviewed: 04/09/2018 Elsevier Patient Education  [employer].

## 2019-09-20 NOTE — Progress Notes (Signed)
PATIENT: Kayla Shah [place] DOB: 1955/09/27  REASON FOR VISIT: follow up HISTORY FROM: patient  Chief Complaint  Patient presents with  . Follow-up    8wk f/u for seizures. States she has been doing well since last visit. Recently had hernia repair surgery.   . room 2    with S/O     HISTORY OF PRESENT ILLNESS: Today 09/20/19 Kayla Shah [place] is a 64 y.o. female here today for follow up for seizures and migraines. She reports doing well. She has tolerated lamotrigine well. She feels that tremor has significantly improved since discontinuing divalproex. She can write again. She can hold a coffee cup. She feels that migraines are well managed. She can't remember the last time she took rizatriptan. She is very happy with current treatment regimen. She is also very proud to report that she recently quit smoking. She is feeling better physically.   HISTORY: (copied from my note on 04/27/2019)  Kayla Shah is a 64 y.o. female here today for follow up for seizures. She continues divalproex 750mg  twice daily. She reports that tremors have significantly worsened. She is unable to hold a coffee cup. She has had significant weight loss as well. She does endorse depression and anxiety but has been followed closely by PCP and decline medications for this. She denies difficulty swallowing. No changes in gait. She denies changes in movements with exception of tremors. No family history of PD. Symptoms have progressively worsened since starting divalproex in May 2020. No seizure activity. She does not drive. PCP started Megace last month. Headaches are well managed at this time.   HISTORY: (copied from my note on 09/06/2018)  [place] 64 [place] today for follow up for seizure.She was admitted to [facility] on 06/12/2018 following two seizures at home. Depakote was subtherapeutic on admission. She denies missed doses. Dose was increased to 750mg  twice daily. She has  noticed an increase in tremor since increasing dosebut is tolerating well otherwise. She denies seizure activity. She is feeling well today and without complaints.   Upon review of ER notes, she had elevated troponin and abnormal EKG (inferior ST depression). Cardiology consulted and advised additional testing. She has extensive family history of cardiovascular disease. She is treated for HTN and told that lipids are elevated (not currently treated). She is smoking with no desire to quit.  HISTORY: (copied fromDr Penumalli'snote on 11/18/2017)  64 year old female here for evaluation of seizure and migraine.  Patient first developed seizures in 52s. She was on Keppra at that time. At some point she was taken off of Keppra. 2014 she was hospitalized for seizure. She may have been on and off of Keppra following 2014 but she is not sure. Patient reports grand mal seizures and postictal confusion symptoms but is not quite sure about the frequency. She had another seizure possibly in September 2019. She is not on antiseizure medication at this time.  Patient has history of migraine headaches at age 61 years old with pounding throbbing headaches, nausea, photophobia, typically unilateral right greater than left-sided headaches. She averages 4-5 headaches per month. She is not sure what type of medication she has been on the past.  Patient also has history of depression, anxiety, stress, weight loss, chronic pain.    REVIEW OF SYSTEMS: Out of a complete 14 system review of symptoms, the patient complains only of the following symptoms, seizures and all other reviewed systems are negative.  ALLERGIES: Allergies  Allergen Reactions  .  Codeine Nausea And Vomiting  . Fluoxetine Anxiety  . Paroxetine Anxiety    Becomes very aggressive with mood swings.    HOME MEDICATIONS: Outpatient Medications Prior to Visit  Medication Sig Dispense Refill  . estrogens, conjugated, ([name])  0.625 MG tablet Take 1 tablet (0.625 mg total) by mouth daily. Take daily for 21 days then do not take for 7 days. 30 tablet 2  . gabapentin (NEURONTIN) 300 MG capsule TAKE ONE CAPSULE BY MOUTH TWICE A DAY (Patient taking differently: Take 300 mg by mouth 2 (two) times daily as needed (pain). ) 60 capsule 3  . HYDROcodone-acetaminophen (NORCO/VICODIN) 5-325 MG tablet Take 1 tablet by mouth every 6 (six) hours as needed for moderate pain. 15 tablet 0  . ibuprofen (ADVIL) 200 MG tablet Take 3 tablets (600 mg total) by mouth every 6 (six) hours as needed for fever, headache, mild pain or moderate pain. 30 tablet 0  . lamoTRIgine (LAMICTAL) 100 MG tablet Take 1 tablet (100 mg total) by mouth 2 (two) times daily. 180 tablet 3  . ondansetron (ZOFRAN-ODT) 8 MG disintegrating tablet TAKE 1 TABLET BY MOUTH EVERY 8 HOURS AS NEEDED FOR NAUSEA OR VOMITING (Patient taking differently: Take 8 mg by mouth every 8 (eight) hours as needed for nausea or vomiting. ) 30 tablet 0  . pantoprazole (PROTONIX) 40 MG tablet TAKE 1 TABLET BY MOUTH TWICE A DAY (Patient taking differently: Take 40 mg by mouth daily. ) 60 tablet 2  . rizatriptan (MAXALT-MLT) 10 MG disintegrating tablet Take 1 tablet (10 mg total) by mouth as needed for migraine. May repeat in 2 hours if needed 9 tablet 11  . simvastatin (ZOCOR) 20 MG tablet TAKE 1 TABLET BY MOUTH DAILY (Patient taking differently: Take 20 mg by mouth daily at 6 PM. ) 90 tablet 1  . verapamil (CALAN-SR) 240 MG CR tablet TAKE 1 TABLET BY MOUTH DAILY (Patient taking differently: Take 240 mg by mouth daily. ) 90 tablet 0   No facility-administered medications prior to visit.    PAST MEDICAL HISTORY: Past Medical History:  Diagnosis Date  . Allergy   . Anxiety    agoraphobia   . Arthritis   . Chicken pox   . COPD (chronic obstructive pulmonary disease) ([place])   . Depression   . Family history of adverse reaction to anesthesia    PONV  . GERD (gastroesophageal reflux disease)    . Headache   . History of blood transfusion    1984  . Hyperlipidemia   . Hypertension   . Migraine   . PONV (postoperative nausea and vomiting)   . Seizure [facility])    last seizure 04/2019    PAST SURGICAL HISTORY: Past Surgical History:  Procedure Laterality Date  . ABDOMINAL HYSTERECTOMY  1984   Total  . APPENDECTOMY  1984  . COLONOSCOPY WITH PROPOFOL N/A 03/26/2017   Procedure: COLONOSCOPY WITH PROPOFOL;  Surgeon: Jonathon Bellows, MD;  Location: [facility] ENDOSCOPY;  Service: Gastroenterology;  Laterality: N/A;  . VENTRAL HERNIA REPAIR N/A 09/16/2019   Procedure: HERNIA REPAIR VENTRAL ADULT, Open;  Surgeon: Fredirick Maudlin, MD;  Location: [facility] ORS;  Service: General;  Laterality: N/A;  . WISDOM TOOTH EXTRACTION      FAMILY HISTORY: Family History  Problem Relation Age of Onset  . Stroke Mother   . Depression Mother   . Arthritis Mother   . Heart disease Father   . Alcohol abuse Father   . Diabetes Father   . Osteoporosis Sister   .  Kidney disease Sister   . Arthritis Sister   . Arthritis Maternal Grandmother   . Heart disease Maternal Grandmother   . Hyperlipidemia Maternal Grandmother   . Diabetes Paternal Grandmother   . Breast cancer Neg Hx     SOCIAL HISTORY: Social History   Socioeconomic History  . Marital status: Divorced    Spouse name: Legrand Como  . Number of children: 0  . Years of education: 65  . Highest education level: Not on file  Occupational History    Comment: disabled  Tobacco Use  . Smoking status: Former Smoker    Packs/day: 0.25    Years: 45.00    Pack years: 11.25    Types: Cigarettes  . Smokeless tobacco: Never Used  . Tobacco comment: reports quitting in feb 2021  Vaping Use  . Vaping Use: Never used  Substance and Sexual Activity  . Alcohol use: No  . Drug use: Yes    Types: Marijuana  . Sexual activity: Never  Other Topics Concern  . Not on file  Social History Narrative   Disabled    Married no kids    No guns, wears seat  belt, feels safe in relationship    12th grade ed.    Social Determinants of Health   Financial Resource Strain:   . Difficulty of Paying Living Expenses:   Food Insecurity:   . Worried About Charity fundraiser in the Last Year:   . Arboriculturist in the Last Year:   Transportation Needs:   . Film/video editor (Medical):   Marland Kitchen Lack of Transportation (Non-Medical):   Physical Activity:   . Days of Exercise per Week:   . Minutes of Exercise per Session:   Stress:   . Feeling of Stress :   Social Connections:   . Frequency of Communication with Friends and Family:   . Frequency of Social Gatherings with Friends and Family:   . Attends Religious Services:   . Active Member of Clubs or Organizations:   . Attends Archivist Meetings:   Marland Kitchen Marital Status:   Intimate Partner Violence:   . Fear of Current or Ex-Partner:   . Emotionally Abused:   Marland Kitchen Physically Abused:   . Sexually Abused:       PHYSICAL EXAM  Vitals:   09/20/19 1409  BP: 134/80  Pulse: (!) 55  Weight: 121 lb 6.4 oz (55.1 kg)  Height: 5\' 5"  (1.651 m)   Body mass index is 20.2 kg/m.  Generalized: Well developed, in no acute distress  Cardiology: normal rate and rhythm, no murmur noted Respiratory: clear to auscultation bilaterally  Neurological examination  Mentation: Alert oriented to time, place, history taking. Follows all commands speech and language fluent Cranial nerve II-XII: Pupils were equal round reactive to light. Extraocular movements were full, visual field were full  Motor: The motor testing reveals 5 over 5 strength of all 4 extremities. Good symmetric motor tone is noted throughout.  Gait and station: Gait is normal.   DIAGNOSTIC DATA (LABS, IMAGING, TESTING) - I reviewed patient records, labs, notes, testing and imaging myself where available.  No flowsheet data found.   Lab Results  Component Value Date   WBC 6.3 06/28/2019   HGB 11.1 06/28/2019   HCT 33.6 (L)  06/28/2019   MCV 106 (H) 06/28/2019   PLT 238 06/28/2019      Component Value Date/Time   NA 141 06/28/2019 1312   NA 139 08/28/2013 1805  K 3.8 06/28/2019 1312   K 3.5 08/28/2013 1805   CL 103 06/28/2019 1312   CL 101 08/28/2013 1805   CO2 19 (L) 06/28/2019 1312   CO2 34 (H) 08/28/2013 1805   GLUCOSE 125 (H) 06/28/2019 1312   GLUCOSE 82 05/03/2019 0630   GLUCOSE 91 08/28/2013 1805   BUN 15 06/28/2019 1312   BUN 13 08/28/2013 1805   CREATININE 0.86 06/28/2019 1312   CREATININE 1.27 08/28/2013 1805   CALCIUM 9.2 06/28/2019 1312   CALCIUM 8.8 08/28/2013 1805   PROT 6.7 06/28/2019 1312   PROT 7.8 08/28/2013 1805   ALBUMIN 4.2 06/28/2019 1312   ALBUMIN 4.2 08/28/2013 1805   AST 15 06/28/2019 1312   AST 23 08/28/2013 1805   ALT 9 06/28/2019 1312   ALT 16 08/28/2013 1805   ALKPHOS 60 06/28/2019 1312   ALKPHOS 64 08/28/2013 1805   BILITOT <0.2 06/28/2019 1312   BILITOT 0.3 08/28/2013 1805   GFRNONAA 72 06/28/2019 1312   GFRNONAA 47 (L) 08/28/2013 1805   GFRAA 83 06/28/2019 1312   GFRAA 54 (L) 08/28/2013 1805   Lab Results  Component Value Date   CHOL 161 03/18/2019   HDL 68 03/18/2019   LDLCALC 77 03/18/2019   TRIG 83 03/18/2019   CHOLHDL 2.1 06/12/2018   Lab Results  Component Value Date   HGBA1C 5.6 06/12/2018   No results found for: VITAMINB12 Lab Results  Component Value Date   TSH 0.850 06/28/2019       ASSESSMENT AND PLAN 64 y.o. year old female  has a past medical history of Allergy, Anxiety, Arthritis, Chicken pox, COPD (chronic obstructive pulmonary disease) ([place]), Depression, Family history of adverse reaction to anesthesia, GERD (gastroesophageal reflux disease), Headache, History of blood transfusion, Hyperlipidemia, Hypertension, Migraine, PONV (postoperative nausea and vomiting), and Seizure ([place]). here with     ICD-10-CM   1. Seizures ([place])  R56.9   2. Migraine without aura and without status migrainosus, not intractable  G43.009     Is  doing very well, today.  She has tolerated lamotrigine 100 mg twice daily.  We will continue current treatment plan.  She will continue to use rizatriptan as needed for migraine abortion.  Tumor has significantly improved.  Mood is stable.  She is now a non-smoker.  I have commended her on smoking cessation and continuation of healthy lifestyle habits.  She will continue close follow-up with primary care.  She will follow-up with me in 1 year, sooner if needed.  She verbalizes understanding and agreement with this plan.   No orders of the defined types were placed in this encounter.    No orders of the defined types were placed in this encounter.     I spent 15 minutes with the patient. 50% of this time was spent counseling and educating patient on plan of care and medications.    Debbora Presto, FNP-C 09/20/2019, 2:48 PM [facility] [address], [place], [place] [address] [phone]

## 2019-09-20 NOTE — Progress Notes (Signed)
Patient stated that she was upset with the anesthesiologist not following her instructions on how to treat her nausea symptoms. Patient reported that the anesthesiologist did not care about what she had to say. She was happy with the nursing care and the aftercare instructions given during post op.

## 2019-09-22 NOTE — Anesthesia Postprocedure Evaluation (Signed)
Anesthesia Post Note  Patient: Kayla Shah [facility]  Procedure(s) Performed: HERNIA REPAIR VENTRAL ADULT, Open (N/A )  Patient location during evaluation: PACU Anesthesia Type: General Level of consciousness: awake and alert Pain management: pain level controlled Vital Signs Assessment: post-procedure vital signs reviewed and stable Respiratory status: spontaneous breathing, nonlabored ventilation, respiratory function stable and patient connected to nasal cannula oxygen Cardiovascular status: blood pressure returned to baseline and stable Postop Assessment: no apparent nausea or vomiting Anesthetic complications: no   No complications documented.   Last Vitals:  Vitals:   09/16/19 0913 09/16/19 0926  BP: 119/66 126/79  Pulse: 85 75  Resp: 18 18  Temp: (!) 36.2 C (!) 36.1 C  SpO2: 97% 98%    Last Pain:  Vitals:   09/20/19 1021  TempSrc:   PainSc: 0-No pain                 Molli Barrows

## 2019-09-26 ENCOUNTER — Telehealth: Payer: Self-pay | Admitting: Family Medicine

## 2019-09-26 NOTE — Telephone Encounter (Signed)
Called pt to schedule an appt per Apolonio Schneiders, no answer, left vm    Copied from [place] [phone]. Topic: General - Inquiry >> Sep 26, 2019  9:12 AM Mathis Bud wrote: Reason for CRM: Patient is requesting PCP or PCP nurse to call back regarding medication estrogens, conjugated, ([name]).  Patient states she doesn't think its not working.  Patient is wondering if she needs to up the dose.  Call back [phone]

## 2019-09-27 ENCOUNTER — Encounter: Payer: Self-pay | Admitting: Family Medicine

## 2019-09-27 ENCOUNTER — Other Ambulatory Visit: Payer: Self-pay

## 2019-09-27 ENCOUNTER — Ambulatory Visit (INDEPENDENT_AMBULATORY_CARE_PROVIDER_SITE_OTHER): Payer: Medicare Other | Admitting: Family Medicine

## 2019-09-27 VITALS — BP 152/89 | HR 63 | Temp 99.2°F | Wt 121.0 lb

## 2019-09-27 DIAGNOSIS — R232 Flushing: Secondary | ICD-10-CM

## 2019-09-27 DIAGNOSIS — I10 Essential (primary) hypertension: Secondary | ICD-10-CM | POA: Diagnosis not present

## 2019-09-27 IMAGING — MR MR HEAD W/O CM
7 of 10 series · 27 of 48 positions shown · non-contrast
Comparison: None.

CLINICAL DATA: Seizures and migraine headaches

EXAM:
MRI HEAD WITHOUT CONTRAST
TECHNIQUE: Multiplanar, multiecho pulse sequences of the brain and surrounding
structures were obtained without intravenous contrast.

[Series 5: T1 · sagittal · 5.0mm · 0.62mm/px · 1 of 25 slices shown (1 of 2)]
[im 1/25]
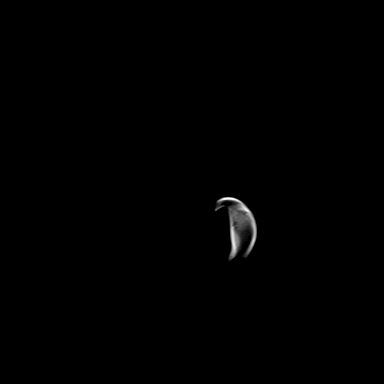

[Series 6: ax dwi_tracew · axial · 3.0mm · 0.73mm/px · z∈[-52,+109]mm · 5 of 55 slices shown]
[im 1/55]
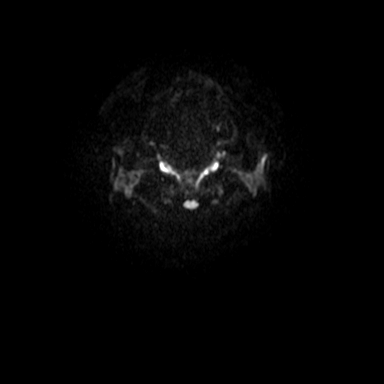
[im 14/55]
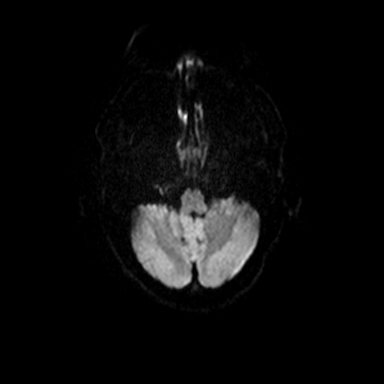
[im 28/55]
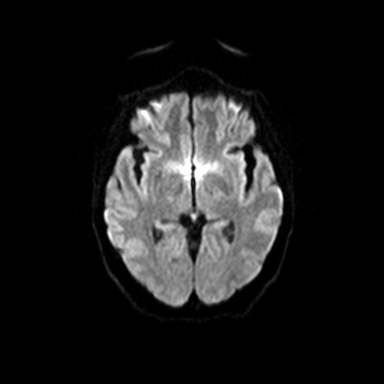
[im 41/55]
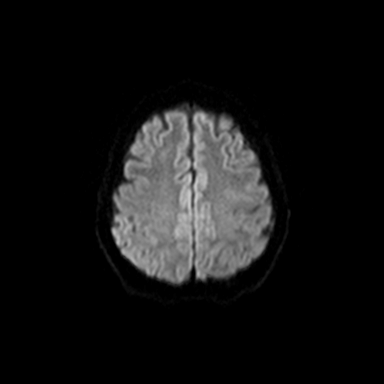
[im 55/55]
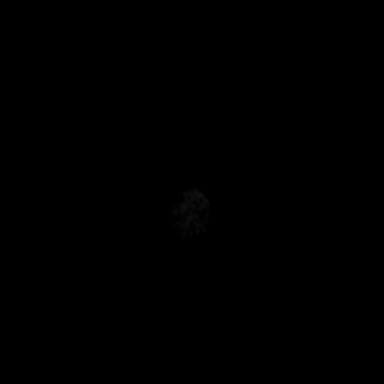

[Series 8: T2 · axial · 5.0mm · 0.53mm/px · z∈[-43,+112]mm · 2 of 27 slices shown (1 of 2)]
[im 1/27]
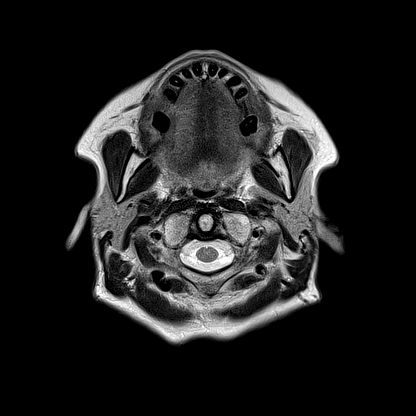
[im 27/27]
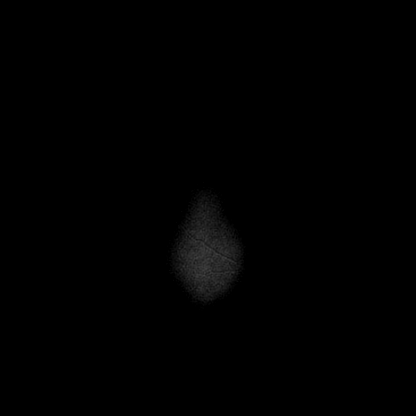

[Series 11: FLAIR · axial · 3.0mm · 0.53mm/px · z∈[-46,+115]mm · 5 of 55 slices shown (1 of 2)]
[im 1/55]
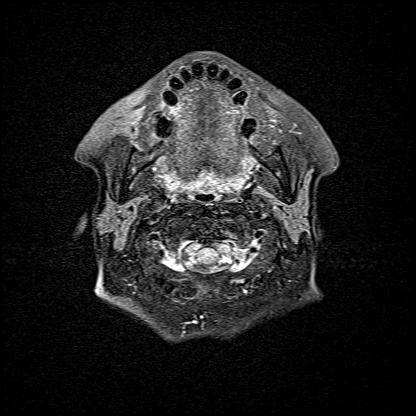
[im 14/55]
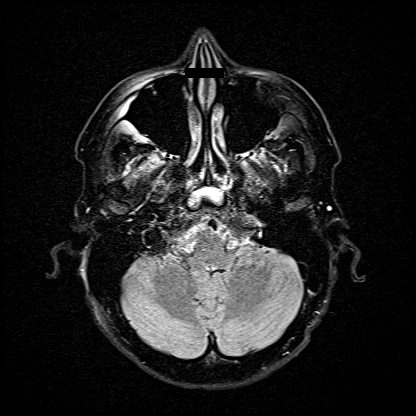
[im 28/55]
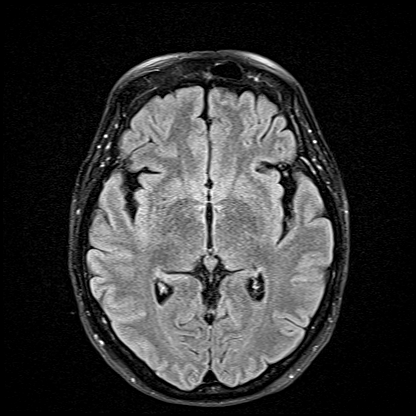
[im 41/55]
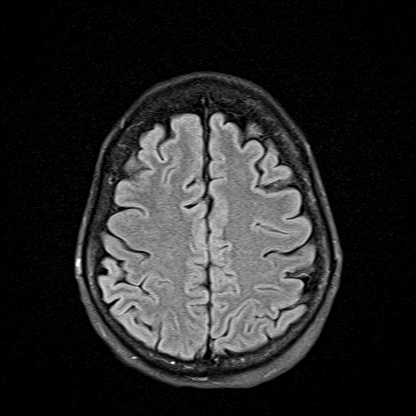
[im 55/55]
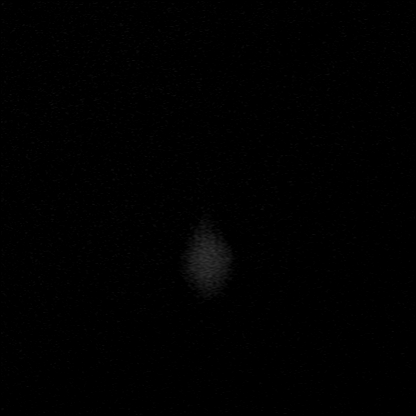

[Series 12: T1 · axial · 1.0mm · 0.98mm/px · z∈[-53,+119]mm · 8 of 174 slices shown (2 of 2)]
[im 1/174]
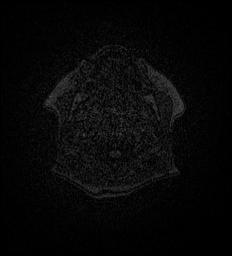
[im 25/174]
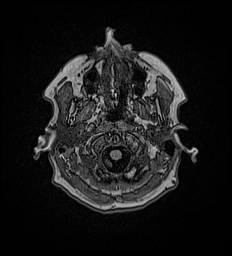
[im 50/174]
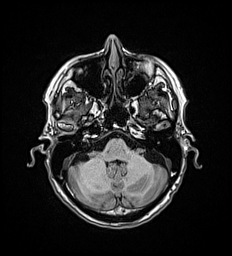
[im 75/174]
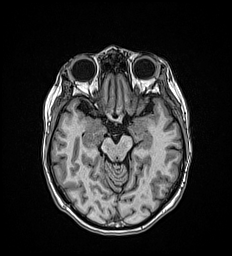
[im 99/174]
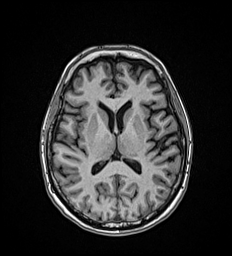
[im 124/174]
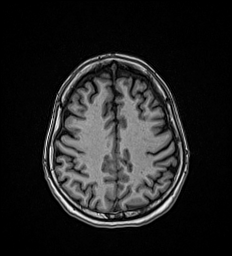
[im 149/174]
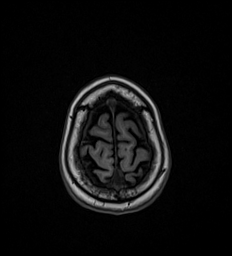
[im 174/174]
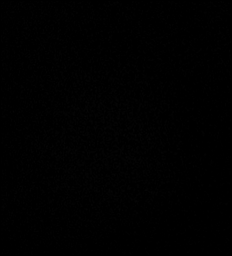

[Series 13: T2 · coronal · 3.0mm · 0.23mm/px · 3 of 35 slices shown (2 of 2)]
[im 1/35]
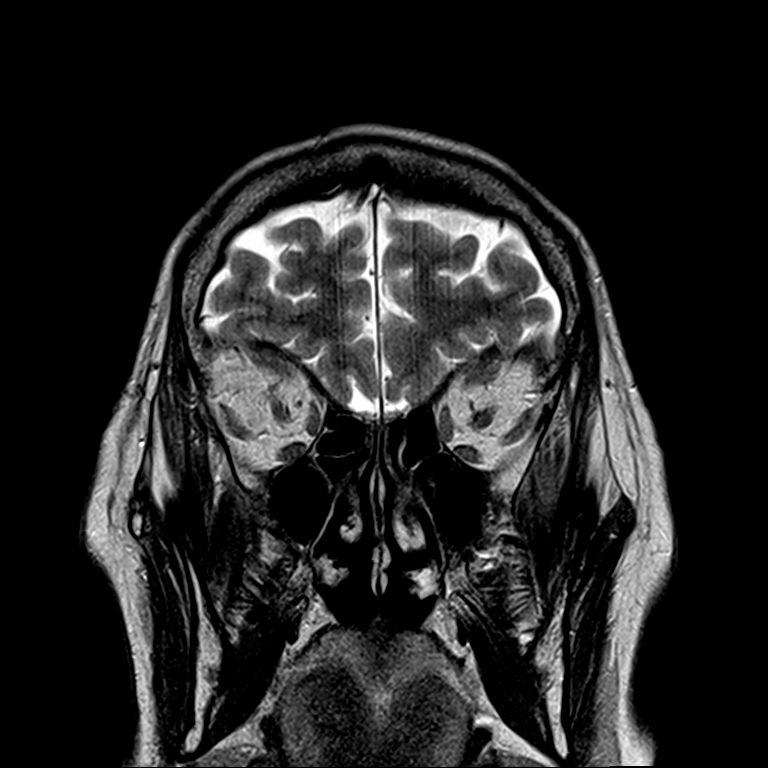
[im 18/35]
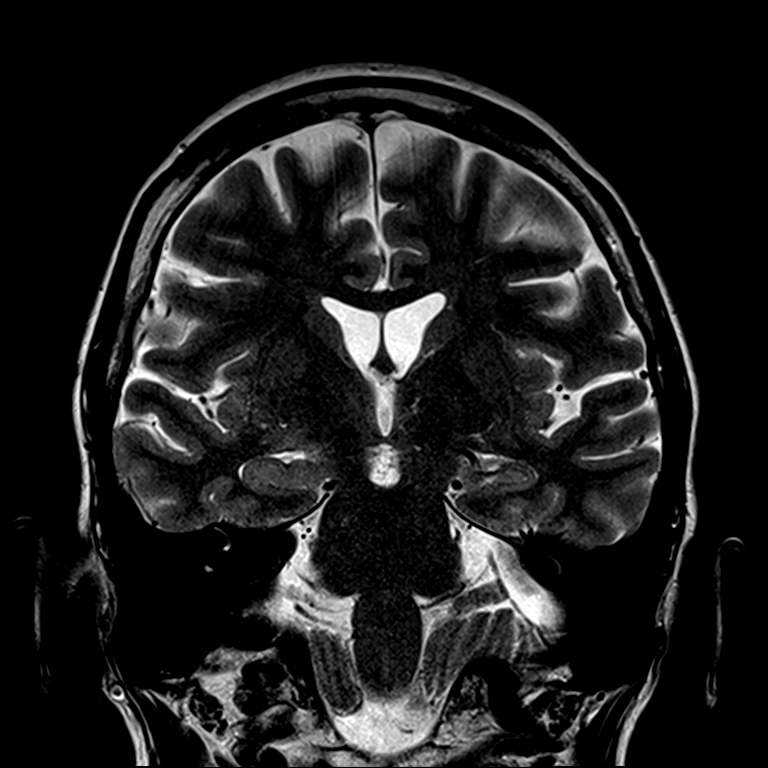
[im 35/35]
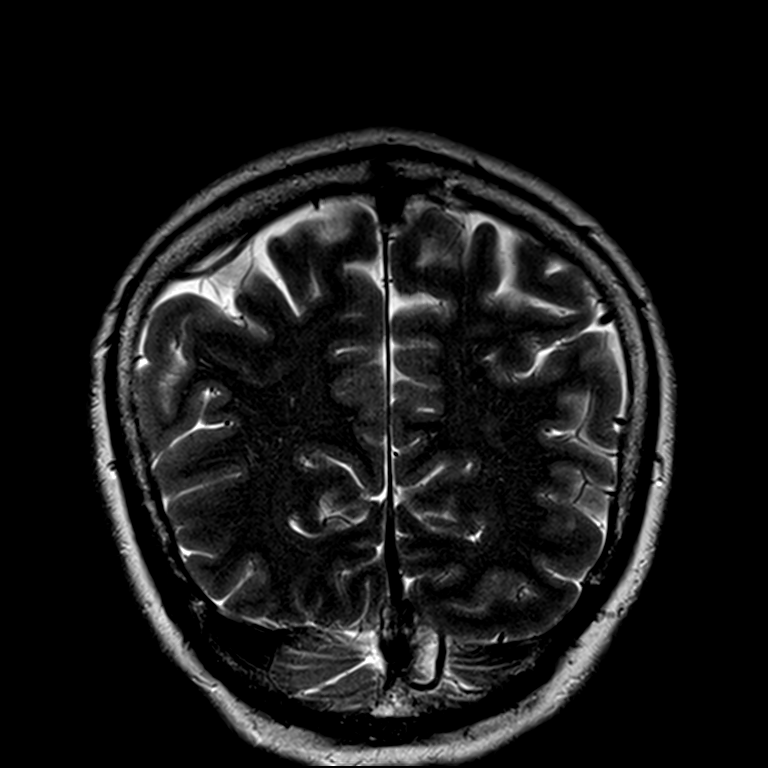

[Series 14: FLAIR · coronal · 3.0mm · 0.35mm/px · 3 of 35 slices shown (2 of 2)]
[im 1/35]
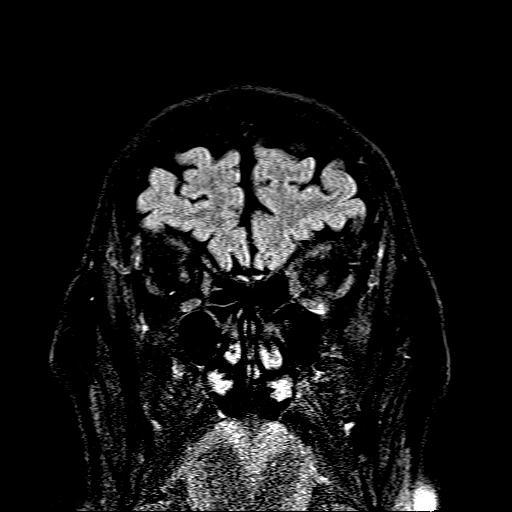
[im 18/35]
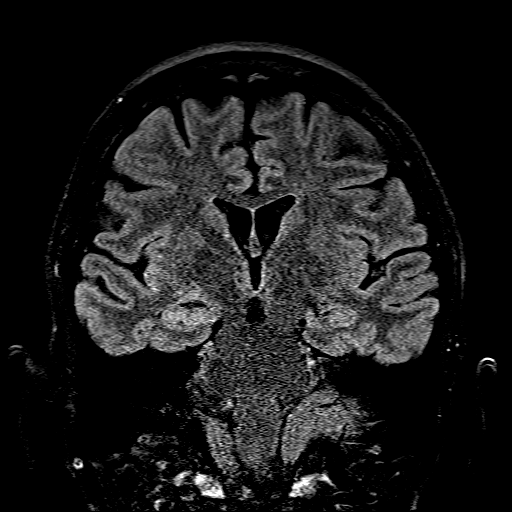
[im 35/35]
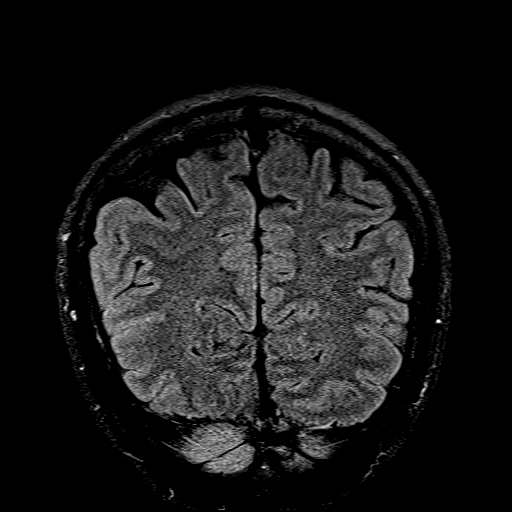

[27 of 48 positions shown; findings below may reference images not displayed]

FINDINGS: BRAIN: There is no acute infarct, acute hemorrhage, hydrocephalus or
extra-axial collection. The midline structures are normal. No
midline shift or other mass effect. There are no old infarcts.
Minimal white matter hyperintensity, nonspecific and commonly seen
in asymptomatic patients of this age. The cerebral and cerebellar
volume are age-appropriate. Susceptibility-sensitive sequences show
no chronic microhemorrhage or superficial siderosis.

VASCULAR: Major intracranial arterial and venous sinus flow voids
are normal.

SKULL AND UPPER CERVICAL SPINE: Calvarial bone marrow signal is
normal. There is no skull base mass. Visualized upper cervical spine
and soft tissues are normal.

SINUSES/ORBITS: No fluid levels or advanced mucosal thickening. No
mastoid or middle ear effusion. The orbits are normal.
IMPRESSION: Normal aging brain.

## 2019-09-27 MED ORDER — VERAPAMIL HCL ER 240 MG PO TBCR: 240.0000 mg | EXTENDED_RELEASE_TABLET | Freq: Every day | ORAL | 0 refills | Status: DC

## 2019-09-27 MED ORDER — ESTROGENS CONJUGATED 0.9 MG PO TABS: 0.9000 mg | ORAL_TABLET | Freq: Every day | ORAL | 0 refills | Status: DC

## 2019-09-27 NOTE — Progress Notes (Signed)
BP (!) 152/89   Pulse 63   Temp 99.2 F (37.3 C) (Oral)   Wt 121 lb (54.9 kg)   SpO2 99%   BMI 20.14 kg/m    Subjective:    Patient ID: Kayla Shah, female    DOB: May 08, 1955, 64 y.o.   MRN: [id number]  HPI: Kayla Shah is a 64 y.o. female  Chief Complaint  Patient presents with  . Hot Flashes    pt wants to discuss about Rx premarin, pt states its not helping well   Here today for f/u hot flashes and night sweats. Not much benefit so far from the estrogen supplementation. Still very bothersome, changing clothes several times overnight due to the sweating. Tolerating the medication well without side effects.   Relevant past medical, surgical, family and social history reviewed and updated as indicated. Interim medical history since our last visit reviewed. Allergies and medications reviewed and updated.  Review of Systems  Per HPI unless specifically indicated above     Objective:    BP (!) 152/89   Pulse 63   Temp 99.2 F (37.3 C) (Oral)   Wt 121 lb (54.9 kg)   SpO2 99%   BMI 20.14 kg/m   Wt Readings from Last 3 Encounters:  09/27/19 121 lb (54.9 kg)  09/20/19 121 lb 6.4 oz (55.1 kg)  09/16/19 119 lb 0.8 oz (54 kg)    Physical Exam Vitals and nursing note reviewed.  Constitutional:      Appearance: Normal appearance. She is not ill-appearing.  HENT:     Head: Atraumatic.  Eyes:     Extraocular Movements: Extraocular movements intact.     Conjunctiva/sclera: Conjunctivae normal.  Cardiovascular:     Rate and Rhythm: Normal rate and regular rhythm.     Heart sounds: Normal heart sounds.  Pulmonary:     Effort: Pulmonary effort is normal.     Breath sounds: Normal breath sounds.  Musculoskeletal:        General: Normal range of motion.     Cervical back: Normal range of motion and neck supple.  Skin:    General: Skin is warm and dry.  Neurological:     Mental Status: She is alert and oriented to person, place, and time.  Psychiatric:         Mood and Affect: Mood normal.        Thought Content: Thought content normal.        Judgment: Judgment normal.     Results for orders placed or performed during the hospital encounter of 09/16/19  Urine Drug Screen, Qualitative ([facility] only)  Result Value Ref Range   Tricyclic, Ur Screen NONE DETECTED NONE DETECTED   Amphetamines, Ur Screen NONE DETECTED NONE DETECTED   MDMA (Ecstasy)Ur Screen NONE DETECTED NONE DETECTED   Cocaine Metabolite,Ur [place] NONE DETECTED NONE DETECTED   Opiate, Ur Screen NONE DETECTED NONE DETECTED   Phencyclidine (PCP) Ur S NONE DETECTED NONE DETECTED   Cannabinoid 50 Ng, Ur [place] POSITIVE (A) NONE DETECTED   Barbiturates, Ur Screen NONE DETECTED NONE DETECTED   Benzodiazepine, Ur Scrn NONE DETECTED NONE DETECTED   Methadone Scn, Ur NONE DETECTED NONE DETECTED      Assessment & Plan:   Problem List Items Addressed This Visit      Cardiovascular and Mediastinum   Essential (primary) hypertension   Relevant Medications   verapamil (CALAN-SR) 240 MG CR tablet    Other Visit Diagnoses    Hot flashes    -  Primary   menopausal vs med side effect? increase estrogen dose, continue to monitor for benefit   Relevant Medications   verapamil (CALAN-SR) 240 MG CR tablet       Follow up plan: Return for as scheduled.

## 2019-09-29 ENCOUNTER — Encounter: Payer: Medicare Other | Admitting: General Surgery

## 2019-09-29 NOTE — Progress Notes (Signed)
I reviewed note and agree with plan.   Yajayra Feldt R. Sole Lengacher, MD 09/29/2019, 6:04 PM Certified in Neurology, Neurophysiology and Neuroimaging  [facility] [address], [address] [place], [place] [address] [phone]  

## 2019-09-30 ENCOUNTER — Encounter: Payer: Medicare Other | Admitting: Physician Assistant

## 2019-10-04 ENCOUNTER — Encounter: Payer: Medicare Other | Admitting: General Surgery

## 2019-10-13 ENCOUNTER — Other Ambulatory Visit: Payer: Self-pay

## 2019-10-13 ENCOUNTER — Telehealth (INDEPENDENT_AMBULATORY_CARE_PROVIDER_SITE_OTHER): Payer: Medicare Other | Admitting: Psychiatry

## 2019-10-13 ENCOUNTER — Encounter: Payer: Self-pay | Admitting: General Surgery

## 2019-10-13 ENCOUNTER — Ambulatory Visit (INDEPENDENT_AMBULATORY_CARE_PROVIDER_SITE_OTHER): Payer: Medicare Other | Admitting: General Surgery

## 2019-10-13 VITALS — BP 152/92 | HR 81 | Temp 98.4°F | Resp 14 | Ht 65.0 in | Wt 123.6 lb

## 2019-10-13 DIAGNOSIS — F419 Anxiety disorder, unspecified: Secondary | ICD-10-CM

## 2019-10-13 DIAGNOSIS — Z8719 Personal history of other diseases of the digestive system: Secondary | ICD-10-CM

## 2019-10-13 DIAGNOSIS — F329 Major depressive disorder, single episode, unspecified: Secondary | ICD-10-CM

## 2019-10-13 DIAGNOSIS — Z9889 Other specified postprocedural states: Secondary | ICD-10-CM

## 2019-10-13 NOTE — Progress Notes (Signed)
[facility] is here today for a postop visit.  She is a 64 year old woman who had a small ventral hernia repaired on September 16, 2019.  She reports that she initially had some postop nausea and vomiting, secondary to the anesthetic agents.  She is now doing much better.  Her appetite is good.  She is having normal bowel movements.  She has been able to gain a little bit of weight.  She denies any fevers or chills.  No recent nausea or vomiting.  Today's Vitals   10/13/19 1007  BP: (!) 152/92  Pulse: 81  Resp: 14  Temp: 98.4 F (36.9 C)  TempSrc: Oral  SpO2: 97%  Weight: 123 lb 9.6 oz (56.1 kg)  Height: 5\' 5"  (1.651 m)   Body mass index is 20.57 kg/m. Focused examination of the surgical sites demonstrates a vertical incision that is healing well.  There is no evidence of recurrent hernia.  There is normal postoperative swelling and a slight healing ridge beneath the surface.  Impression and plan: This is a 64 year old woman who had a small ventral hernia repaired last month.  She is doing well.  She should continue to refrain from lifting anything heavier than 10 pounds for another 2 weeks, otherwise she may resume all of her usual activities.  I will see her on an as-needed basis.

## 2019-10-13 NOTE — Patient Instructions (Addendum)

## 2019-10-13 NOTE — Progress Notes (Signed)
Patient ID: Kayla Shah, female   DOB: 31-Jan-1956, 64 y.o.   MRN: [id number]   Patient reported she was not aware this was a virtual visit and did not want to do it.

## 2019-11-11 ENCOUNTER — Other Ambulatory Visit: Payer: Self-pay

## 2019-11-11 ENCOUNTER — Encounter: Payer: Self-pay | Admitting: Family Medicine

## 2019-11-11 ENCOUNTER — Ambulatory Visit (INDEPENDENT_AMBULATORY_CARE_PROVIDER_SITE_OTHER): Payer: Medicare Other | Admitting: Family Medicine

## 2019-11-11 VITALS — BP 113/69 | HR 79 | Temp 98.2°F | Resp 18 | Ht 65.0 in | Wt 127.6 lb

## 2019-11-11 DIAGNOSIS — F32A Depression, unspecified: Secondary | ICD-10-CM

## 2019-11-11 DIAGNOSIS — F419 Anxiety disorder, unspecified: Secondary | ICD-10-CM

## 2019-11-11 DIAGNOSIS — R7309 Other abnormal glucose: Secondary | ICD-10-CM

## 2019-11-11 DIAGNOSIS — Z1231 Encounter for screening mammogram for malignant neoplasm of breast: Secondary | ICD-10-CM

## 2019-11-11 DIAGNOSIS — Z7689 Persons encountering health services in other specified circumstances: Secondary | ICD-10-CM | POA: Diagnosis not present

## 2019-11-11 LAB — POCT GLYCOSYLATED HEMOGLOBIN (HGB A1C): Hemoglobin A1C: 5.1 % (ref 4.0–5.6)

## 2019-11-11 LAB — POCT URINALYSIS DIPSTICK
Bilirubin, UA: NEGATIVE
Blood, UA: NEGATIVE
Glucose, UA: NEGATIVE
Ketones, UA: NEGATIVE
Leukocytes, UA: NEGATIVE
Nitrite, UA: NEGATIVE
Protein, UA: NEGATIVE
Spec Grav, UA: 1.01 (ref 1.010–1.025)
Urobilinogen, UA: 0.2 E.U./dL
pH, UA: 7.5 (ref 5.0–8.0)

## 2019-11-11 MED ORDER — ALPRAZOLAM 0.25 MG PO TABS: 0.2500 mg | ORAL_TABLET | Freq: Two times a day (BID) | ORAL | 0 refills | Status: DC | PRN

## 2019-11-11 NOTE — Assessment & Plan Note (Signed)
Elevated glucose on labs 06/2019.  POCT A1C 5.1%.  Discussed with patient.

## 2019-11-11 NOTE — Progress Notes (Signed)
Subjective:    Patient ID: Kayla Shah, female    DOB: [birth year], 64 y.o.   MRN: [id number]  Gabriele Zwilling is a 64 y.o. female presenting on 11/11/2019 for Establish Care   HPI   Ms. Mellinger presents to clinic as a new patient to establish for primary care services.  Previous PCP was at [facility].  Records will not be requested, as are available in the EMR.  Past medical, family, and surgical history reviewed w/ pt.  Ms. Albertsen has acute concerns for local psychiatric resources.  Has concerns of acute anxiety exacerbation with recent loss of 4+ family/friends with multiple deaths and funerals.  Depression screen [facility] 2/9 09/27/2019 05/26/2019 09/08/2018  Decreased Interest 0 2 0  Down, Depressed, Hopeless 0 2 0  PHQ - 2 Score 0 4 0  Altered sleeping 0 0 0  Tired, decreased energy 0 0 0  Change in appetite 0 2 0  Feeling bad or failure about yourself  0 0 0  Trouble concentrating 0 0 1  Moving slowly or fidgety/restless 0 3 0  Suicidal thoughts 0 0 0  PHQ-9 Score 0 9 1  Difficult doing work/chores - Not difficult at all -    Social History   Tobacco Use  . Smoking status: Former Smoker    Packs/day: 0.25    Years: 45.00    Pack years: 11.25    Types: Cigarettes  . Smokeless tobacco: Never Used  . Tobacco comment: reports quitting in feb 2021  Vaping Use  . Vaping Use: Never used  Substance Use Topics  . Alcohol use: No  . Drug use: Yes    Types: Marijuana    Review of Systems  Constitutional: Negative.   HENT: Negative.   Eyes: Negative.   Respiratory: Negative.   Cardiovascular: Negative.   Gastrointestinal: Negative.   Endocrine: Negative.   Genitourinary: Negative.   Musculoskeletal: Negative.   Skin: Negative.   Allergic/Immunologic: Negative.   Neurological: Negative.   Hematological: Negative.   Psychiatric/Behavioral: Negative for agitation, behavioral problems, confusion, decreased concentration, dysphoric mood, hallucinations,  self-injury, sleep disturbance and suicidal ideas. The patient is nervous/anxious. The patient is not hyperactive.    Per HPI unless specifically indicated above     Objective:    BP 113/69 (BP Location: Right Arm, Patient Position: Sitting, Cuff Size: Normal)   Pulse 79   Temp 98.2 F (36.8 C) (Oral)   Resp 18   Ht 5\' 5"  (1.651 m)   Wt 127 lb 9.6 oz (57.9 kg)   SpO2 100%   BMI 21.23 kg/m   Wt Readings from Last 3 Encounters:  11/11/19 127 lb 9.6 oz (57.9 kg)  10/13/19 123 lb 9.6 oz (56.1 kg)  09/27/19 121 lb (54.9 kg)    Physical Exam Vitals and nursing note reviewed.  Constitutional:      General: She is not in acute distress.    Appearance: Normal appearance. She is well-developed, well-groomed and normal weight. She is not ill-appearing or toxic-appearing.  HENT:     Head: Normocephalic and atraumatic.     Nose:     Comments: Lizbeth Bark is in place, covering mouth and nose. Eyes:     General: Lids are normal. Vision grossly intact.        Right eye: No discharge.        Left eye: No discharge.     Extraocular Movements: Extraocular movements intact.     Conjunctiva/sclera: Conjunctivae normal.  Pupils: Pupils are equal, round, and reactive to light.  Cardiovascular:     Rate and Rhythm: Normal rate and regular rhythm.     Pulses: Normal pulses.     Heart sounds: Normal heart sounds. No murmur heard.  No friction rub. No gallop.   Pulmonary:     Effort: Pulmonary effort is normal. No respiratory distress.     Breath sounds: Normal breath sounds.  Musculoskeletal:     Right lower leg: No edema.     Left lower leg: No edema.  Skin:    General: Skin is warm and dry.     Capillary Refill: Capillary refill takes less than 2 seconds.  Neurological:     General: No focal deficit present.     Mental Status: She is alert and oriented to person, place, and time.  Psychiatric:        Attention and Perception: Attention and perception normal.        Mood and Affect:  Affect normal. Mood is anxious.        Speech: Speech normal.        Behavior: Behavior normal. Behavior is cooperative.        Thought Content: Thought content normal.        Cognition and Memory: Cognition and memory normal.        Judgment: Judgment normal.    Results for orders placed or performed in visit on 11/11/19  POCT Urinalysis Dipstick  Result Value Ref Range   Color, UA yellow    Clarity, UA clear    Glucose, UA Negative Negative   Bilirubin, UA neg    Ketones, UA neg    Spec Grav, UA 1.010 1.010 - 1.025   Blood, UA neg    pH, UA 7.5 5.0 - 8.0   Protein, UA Negative Negative   Urobilinogen, UA 0.2 0.2 or 1.0 E.U./dL   Nitrite, UA neg    Leukocytes, UA Negative Negative   Appearance     Odor    POCT glycosylated hemoglobin (Hb A1C)  Result Value Ref Range   Hemoglobin A1C 5.1 4.0 - 5.6 %   HbA1c POC (<> result, manual entry)     HbA1c, POC (prediabetic range)     HbA1c, POC (controlled diabetic range)        Assessment & Plan:   Problem List Items Addressed This Visit      Other   Anxiety and depression    Provided with resources for local psychiatry management of anxiety.  Patient requesting small prescription of alprazolam.  Discussed can send small ONE TIME prescription for alprazolam and will defer any additional refills to psychiatry.  Patient verbalized understanding.  Plan: 1. Local psychiatry resources provided 2. Alprazolam 0.25mg  BID PRN for anxiety #20 sent to pharmacy on file 3. RTC in 3 months      Relevant Medications   ALPRAZolam (XANAX) 0.25 MG tablet   Encounter to establish care - Primary    New patient establishment at [facility] for primary care services.  Recent labs 06/28/2019.      Relevant Orders   POCT Urinalysis Dipstick (Completed)   Elevated glucose level    Elevated glucose on labs 06/2019.  POCT A1C 5.1%.  Discussed with patient.      Relevant Orders   POCT glycosylated hemoglobin (Hb A1C) (Completed)   Encounter for  screening mammogram for malignant neoplasm of breast    Pt last mammogram 02/28/2018.  Result Negative-BI-RADS 1.  Plan: 1. Screening  mammogram order placed.  Pt will call to schedule appointment.  Information given.       Relevant Orders   MM 3D SCREEN BREAST BILATERAL      Meds ordered this encounter  Medications  . ALPRAZolam (XANAX) 0.25 MG tablet    Sig: Take 1 tablet (0.25 mg total) by mouth 2 (two) times daily as needed for anxiety.    Dispense:  20 tablet    Refill:  0    No additional refills from our office   Follow up plan: Return in about 3 months (around 02/11/2020) for CPE.   Harlin Rain, [place] Family Nurse Practitioner [facility] 11/11/2019, 12:24 PM

## 2019-11-11 NOTE — Patient Instructions (Addendum)
I have sent in a one time short course of alprazolam to your pharmacy on file.  As we discussed, we will be unable to continue to refill this in our office, will need to establish and discuss with psychiatry for continued refills.  For Mammogram screening for breast cancer   Call the [facility] below anytime to schedule your own appointment now that order has been placed.  [facility] [place], [place] [address] Phone: [phone]  [facility] [address] [place], [place] [address] Phone: [phone]  We have contacts for psychiatry.  The practice we had spoken about that has providers on staff that complete genetic testing to find out which medications may be the most helpful is [facility].  They have locations all over [place].  Their [facility] office is [phone] and they do offer telemedicine appointments.  If [facility] is not the right fit for you, there are these other providers listed below.  Please contact one of these locations below to schedule an appointment for psychiatric care  [facility] [address] [place], [place] [address] Phone# [phone]  [facility] [place], [place] [address] Phone# [phone]  [facility] [address] [place], [place] [address] Phone# [phone]  [place],  [address] Phone# [phone]  We will plan to see you back in 3 months for your physical  You will receive a survey after today's visit either digitally by e-mail or paper by [place] mail. Your experiences and feedback matter to [place].  Please respond so we know how we are doing as we provide care for you.  Call us with any questions/concerns/needs.  It is my goal to be available to you for your health concerns.  Thanks for choosing me to be a partner in your healthcare  needs!  Harlin Rain, FNP-C Family Nurse Practitioner [facility] Phone: [phone]

## 2019-11-11 NOTE — Assessment & Plan Note (Signed)
Provided with resources for local psychiatry management of anxiety.  Patient requesting small prescription of alprazolam.  Discussed can send small ONE TIME prescription for alprazolam and will defer any additional refills to psychiatry.  Patient verbalized understanding.  Plan: 1. Local psychiatry resources provided 2. Alprazolam 0.25mg  BID PRN for anxiety #20 sent to pharmacy on file 3. RTC in 3 months

## 2019-11-11 NOTE — Assessment & Plan Note (Signed)
New patient establishment at [facility] for primary care services.  Recent labs 06/28/2019.

## 2019-11-11 NOTE — Assessment & Plan Note (Signed)
Pt last mammogram 02/28/2018.  Result Negative-BI-RADS 1.  Plan: 1. Screening mammogram order placed.  Pt will call to schedule appointment.  Information given.

## 2019-11-16 ENCOUNTER — Ambulatory Visit: Payer: Medicare Other | Admitting: Family Medicine

## 2019-11-18 ENCOUNTER — Other Ambulatory Visit: Payer: Self-pay | Admitting: Family Medicine

## 2019-11-18 DIAGNOSIS — F419 Anxiety disorder, unspecified: Secondary | ICD-10-CM

## 2019-11-18 DIAGNOSIS — F32A Depression, unspecified: Secondary | ICD-10-CM

## 2019-11-18 NOTE — Telephone Encounter (Signed)
Medication Refill - Medication: alprazolam   Has the patient contacted their pharmacy? Yes.   Pt states that she has not been able to find a psychiatrist that will accept her insurance. 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 Please advise.  (Agent: If no, request that the patient contact the pharmacy for the refill.) (Agent: If yes, when and what did the pharmacy advise?)  Preferred Pharmacy (with phone number or street name):  [facility] [address] [place], [place]  [place] [address]  Phone: [phone] Fax: [phone]  Hours: Not open 24 hours     Agent: Please be advised that RX refills may take up to 3 business days. We ask that you follow-up with your pharmacy.

## 2019-11-18 NOTE — Telephone Encounter (Signed)
Rx was a one time courtesy prescription.  Discussed at visit needed to establish with psychiatry.  Will not be refilling this.

## 2019-11-18 NOTE — Telephone Encounter (Signed)
Requested medication (s) are due for refill today: no  Requested medication (s) are on the active medication list: yes  Last refill:  11/11/19 #20 0 refills  Future visit scheduled: yes in 3 months  Notes to clinic:  not delegated per protocol     Requested Prescriptions  Pending Prescriptions Disp Refills   ALPRAZolam (XANAX) 0.25 MG tablet 20 tablet 0    Sig: Take 1 tablet (0.25 mg total) by mouth 2 (two) times daily as needed for anxiety.      Not Delegated - Psychiatry:  Anxiolytics/Hypnotics Failed - 11/18/2019 11:25 AM      Failed - This refill cannot be delegated      Passed - Urine Drug Screen completed in last 360 days.      Passed - Valid encounter within last 6 months    Recent Outpatient Visits           1 week ago Encounter to establish care   [place], FNP   1 month ago Hot flashes   [place], [place], [place]   3 months ago Anxiety and depression   [place], [place], [place]   5 months ago Seizures [facility])   [place], [place], [place]   5 months ago Seizures [facility])   [place], Lilia [place], [place]       Future Appointments             In 3 months [place], Lupita Raider, [facility], [facility]

## 2019-11-18 NOTE — Telephone Encounter (Signed)
The pt was notified of Kayla Shah recommendation. She verbalize understanding.

## 2019-11-23 ENCOUNTER — Telehealth: Payer: Self-pay

## 2019-11-23 NOTE — Telephone Encounter (Signed)
Copied from [place] [id number]. Topic: General - Other >> Nov 23, 2019  9:20 AM Leward Quan A wrote: Reason for CRM: Patient called in to inquire of Cyndia Skeeters to please write her an Rx for Phenergan instead of the ondansetron (ZOFRAN-ODT) 8 MG disintegrating tablet. Per patient the ondansetron (ZOFRAN-ODT) 8 MG disintegrating tablet is not working anymore. Please advise Ph# [phone]

## 2019-11-24 NOTE — Telephone Encounter (Signed)
Attempted to contact the patient, no answer. I left a detail message on her personal vm.

## 2019-11-24 NOTE — Telephone Encounter (Signed)
Unfortunately will be unable to send in a different prescription, would need to have an appointment to discuss

## 2019-11-25 DIAGNOSIS — H2513 Age-related nuclear cataract, bilateral: Secondary | ICD-10-CM | POA: Diagnosis not present

## 2019-11-25 DIAGNOSIS — H21233 Degeneration of iris (pigmentary), bilateral: Secondary | ICD-10-CM | POA: Diagnosis not present

## 2019-12-02 DIAGNOSIS — Z79899 Other long term (current) drug therapy: Secondary | ICD-10-CM | POA: Diagnosis not present

## 2019-12-08 ENCOUNTER — Encounter: Payer: Self-pay | Admitting: Family Medicine

## 2019-12-08 ENCOUNTER — Other Ambulatory Visit: Payer: Self-pay

## 2019-12-08 ENCOUNTER — Ambulatory Visit (INDEPENDENT_AMBULATORY_CARE_PROVIDER_SITE_OTHER): Payer: Medicare Other | Admitting: Family Medicine

## 2019-12-08 VITALS — BP 132/68 | HR 71 | Temp 98.0°F | Resp 17 | Ht 65.0 in | Wt 131.6 lb

## 2019-12-08 DIAGNOSIS — R110 Nausea: Secondary | ICD-10-CM | POA: Diagnosis not present

## 2019-12-08 DIAGNOSIS — K219 Gastro-esophageal reflux disease without esophagitis: Secondary | ICD-10-CM | POA: Diagnosis not present

## 2019-12-08 MED ORDER — PROMETHAZINE HCL 12.5 MG PO TABS: 12.5000 mg | ORAL_TABLET | Freq: Three times a day (TID) | ORAL | 0 refills | Status: DC | PRN

## 2019-12-08 MED ORDER — FAMOTIDINE 10 MG PO TABS: 10.0000 mg | ORAL_TABLET | Freq: Two times a day (BID) | ORAL | 1 refills | Status: DC

## 2019-12-08 NOTE — Assessment & Plan Note (Signed)
Currently on protonix and reports tolerating this medication well.  Reports many years history of nausea, having been taking ondansetron up until recently, and has not found relief from this medication, requesting to switch to promethazine for treatment of nausea.  Has not met with gastroenterology for evaluation of chronic nausea and discussed would encourage an H2 blocker, such as famotidine, and will do a short course of promethazine while awaiting evaluation of famotidine.  If not achieving relief of nausea, will refer to gastroenterology for evaluation.  Discussed would be important to find base cause of nausea to ensure we are treating appropriately.  Patient reported would likely not follow through with GI referral.  Discussed would not refill the promethazine without appropriate evaluation of symptoms.

## 2019-12-08 NOTE — Assessment & Plan Note (Signed)
See GERD A/P

## 2019-12-08 NOTE — Progress Notes (Signed)
Subjective:    Patient ID: Kayla Shah, female    DOB: 18-Aug-1955, 64 y.o.   MRN: [id number]  Kayla Shah is a 64 y.o. female presenting on 12/08/2019 for Nausea (pt requesting a alternative for her zofran. She is requesting to change the prescription over to promethazine . She state that her Lamictal and premarin makes her nauseated.)   HPI  Ms. Kayla Shah presents to clinic for concerns of nausea.  Reports this has been chronic and she has taken ondansetron for her symptoms in the past, but that medication is no longer working.  She is requesting prescription for promethazine.  Is currently taking protonix for her GERD and reports this has helped her symptoms but has not reduced her nausea.  Denies cough, SOB, vomiting.  Depression screen [facility] 2/9 09/27/2019 05/26/2019 09/08/2018  Decreased Interest 0 2 0  Down, Depressed, Hopeless 0 2 0  PHQ - 2 Score 0 4 0  Altered sleeping 0 0 0  Tired, decreased energy 0 0 0  Change in appetite 0 2 0  Feeling bad or failure about yourself  0 0 0  Trouble concentrating 0 0 1  Moving slowly or fidgety/restless 0 3 0  Suicidal thoughts 0 0 0  PHQ-9 Score 0 9 1  Difficult doing work/chores - Not difficult at all -    Social History   Tobacco Use  . Smoking status: Former Smoker    Packs/day: 0.25    Years: 45.00    Pack years: 11.25    Types: Cigarettes  . Smokeless tobacco: Never Used  . Tobacco comment: reports quitting in feb 2021  Vaping Use  . Vaping Use: Never used  Substance Use Topics  . Alcohol use: No  . Drug use: Yes    Types: Marijuana    Review of Systems  Constitutional: Negative.   HENT: Negative.   Eyes: Negative.   Respiratory: Negative.   Cardiovascular: Negative.   Gastrointestinal: Positive for nausea. Negative for abdominal distention, abdominal pain, anal bleeding, blood in stool, constipation, diarrhea, rectal pain and vomiting.  Endocrine: Negative.   Genitourinary: Negative.   Musculoskeletal:  Negative.   Skin: Negative.   Allergic/Immunologic: Negative.   Neurological: Negative.   Hematological: Negative.   Psychiatric/Behavioral: Negative.    Per HPI unless specifically indicated above     Objective:    BP 132/68 (BP Location: Right Arm, Patient Position: Sitting, Cuff Size: Normal)   Pulse 71   Temp 98 F (36.7 C) (Oral)   Resp 17   Ht 5' 5"  (1.651 m)   Wt 131 lb 9.6 oz (59.7 kg)   SpO2 100%   BMI 21.90 kg/m   Wt Readings from Last 3 Encounters:  12/08/19 131 lb 9.6 oz (59.7 kg)  11/11/19 127 lb 9.6 oz (57.9 kg)  10/13/19 123 lb 9.6 oz (56.1 kg)    Physical Exam Vitals and nursing note reviewed.  Constitutional:      General: She is not in acute distress.    Appearance: Normal appearance. She is well-developed, well-groomed and normal weight. She is not ill-appearing or toxic-appearing.  HENT:     Head: Normocephalic and atraumatic.     Nose:     Comments: Kayla Shah is in place, covering mouth and nose. Eyes:     General: Lids are normal. Vision grossly intact.        Right eye: No discharge.        Left eye: No discharge.  Extraocular Movements: Extraocular movements intact.     Conjunctiva/sclera: Conjunctivae normal.     Pupils: Pupils are equal, round, and reactive to light.  Cardiovascular:     Rate and Rhythm: Normal rate and regular rhythm.     Pulses: Normal pulses.     Heart sounds: Normal heart sounds. No murmur heard.  No friction rub. No gallop.   Pulmonary:     Effort: Pulmonary effort is normal. No respiratory distress.     Breath sounds: Normal breath sounds.  Abdominal:     General: There is no distension.     Tenderness: There is no guarding.  Skin:    General: Skin is warm and dry.     Capillary Refill: Capillary refill takes less than 2 seconds.  Neurological:     General: No focal deficit present.     Mental Status: She is alert and oriented to person, place, and time.  Psychiatric:        Attention and Perception:  Attention and perception normal.        Mood and Affect: Mood and affect normal.        Speech: Speech normal.        Behavior: Behavior normal. Behavior is cooperative.        Thought Content: Thought content normal.        Cognition and Memory: Cognition and memory normal.        Judgment: Judgment normal.    Results for orders placed or performed in visit on 11/11/19  POCT Urinalysis Dipstick  Result Value Ref Range   Color, UA yellow    Clarity, UA clear    Glucose, UA Negative Negative   Bilirubin, UA neg    Ketones, UA neg    Spec Grav, UA 1.010 1.010 - 1.025   Blood, UA neg    pH, UA 7.5 5.0 - 8.0   Protein, UA Negative Negative   Urobilinogen, UA 0.2 0.2 or 1.0 E.U./dL   Nitrite, UA neg    Leukocytes, UA Negative Negative   Appearance     Odor    POCT glycosylated hemoglobin (Hb A1C)  Result Value Ref Range   Hemoglobin A1C 5.1 4.0 - 5.6 %   HbA1c POC (<> result, manual entry)     HbA1c, POC (prediabetic range)     HbA1c, POC (controlled diabetic range)        Assessment & Plan:   Problem List Items Addressed This Visit      Digestive   Gastroesophageal reflux disease without esophagitis - Primary    Currently on protonix and reports tolerating this medication well.  Reports many years history of nausea, having been taking ondansetron up until recently, and has not found relief from this medication, requesting to switch to promethazine for treatment of nausea.  Has not met with gastroenterology for evaluation of chronic nausea and discussed would encourage an H2 blocker, such as famotidine, and will do a short course of promethazine while awaiting evaluation of famotidine.  If not achieving relief of nausea, will refer to gastroenterology for evaluation.  Discussed would be important to find base cause of nausea to ensure we are treating appropriately.  Patient reported would likely not follow through with GI referral.  Discussed would not refill the promethazine  without appropriate evaluation of symptoms.      Relevant Medications   famotidine (PEPCID) 10 MG tablet     Other   Nausea    See GERD A/P  Relevant Medications   promethazine (PHENERGAN) 12.5 MG tablet      Meds ordered this encounter  Medications  . famotidine (PEPCID) 10 MG tablet    Sig: Take 1 tablet (10 mg total) by mouth 2 (two) times daily.    Dispense:  60 tablet    Refill:  1  . promethazine (PHENERGAN) 12.5 MG tablet    Sig: Take 1 tablet (12.5 mg total) by mouth every 8 (eight) hours as needed for nausea or vomiting.    Dispense:  20 tablet    Refill:  0    Follow up plan: Return in about 3 weeks (around 12/29/2019) for Nausea and medication f/u.   Harlin Rain, [place] Family Nurse Practitioner [facility] 12/08/2019, 4:42 PM

## 2019-12-08 NOTE — Patient Instructions (Signed)
I have sent in a prescription for famotidine 10mg , to take 1 tablet 2x per day for your acid reflux and nausea.  As we discussed, it can take up to 4 days to see a change in your symptoms.  I have sent in a short term prescription for promethazine 12.5mg  to take 1 tablet every 8 hours for nausea.  Will need to have a follow up visit to see how the famotidine has helped your nausea.  If the famotidine is not helping control your nausea, we will need to look into a referral to gastroenterology to ensure there is not anything else that is going on.  We will plan to see you back in 2-3 weeks for nausea and medication follow up visit  You will receive a survey after today's visit either digitally by e-mail or paper by [place] mail. Your experiences and feedback matter to [place].  Please respond so we know how we are doing as we provide care for you.  Call us with any questions/concerns/needs.  It is my goal to be available to you for your health concerns.  Thanks for choosing me to be a partner in your healthcare needs!  Harlin Rain, FNP-C Family Nurse Practitioner [facility] Phone: [phone]

## 2019-12-09 ENCOUNTER — Telehealth: Payer: Self-pay

## 2019-12-09 NOTE — Telephone Encounter (Signed)
Copied from [place] [id number]. Topic: General - Other >> Dec 09, 2019  8:26 AM Oneta Rack wrote: Patient wanted to let PCP know she "loves her" she took 1 dose of the famotidine (PEPCID) 10 MG tablet and woke up feeling great, not [place], symtppms from 12/08/2019 have improved drastically.

## 2019-12-16 ENCOUNTER — Other Ambulatory Visit: Payer: Self-pay | Admitting: Family Medicine

## 2019-12-16 DIAGNOSIS — G8929 Other chronic pain: Secondary | ICD-10-CM

## 2019-12-16 NOTE — Telephone Encounter (Signed)
Requested medication (s) are due for refill today: Yes  Requested medication (s) are on the active medication list: Yes  Last refill:  11/09/18  Future visit scheduled: Yes  Notes to clinic:  Prescription has expired.    Requested Prescriptions  Pending Prescriptions Disp Refills   gabapentin (NEURONTIN) 300 MG capsule [Pharmacy Med Name: GABAPENTIN 300 MG CAP] 60 capsule 3    Sig: TAKE ONE CAPSULE BY MOUTH TWICE A DAY      Neurology: Anticonvulsants - gabapentin Passed - 12/16/2019  9:56 AM      Passed - Valid encounter within last 12 months    Recent Outpatient Visits           1 week ago Gastroesophageal reflux disease without esophagitis   [place], FNP   1 month ago Encounter to establish care   [place], FNP   2 months ago Hot flashes   [facility] Merrie Roof [place], [place]   4 months ago Anxiety and depression   [place], [place]   5 months ago Seizures [facility])   [place], Lilia Argue, [place]       Future Appointments             In 2 months [place], Lupita Raider, [facility], [facility]

## 2019-12-20 ENCOUNTER — Other Ambulatory Visit: Payer: Self-pay

## 2019-12-20 ENCOUNTER — Ambulatory Visit
Admission: RE | Admit: 2019-12-20 | Discharge: 2019-12-20 | Disposition: A | Discharge status: Home / Self Care | Payer: Medicare Other | Source: Ambulatory Visit | Attending: Family Medicine | Admitting: Family Medicine

## 2019-12-20 DIAGNOSIS — Z1231 Encounter for screening mammogram for malignant neoplasm of breast: Secondary | ICD-10-CM | POA: Insufficient documentation

## 2019-12-26 ENCOUNTER — Other Ambulatory Visit: Payer: Self-pay | Admitting: Family Medicine

## 2019-12-26 DIAGNOSIS — E7800 Pure hypercholesterolemia, unspecified: Secondary | ICD-10-CM

## 2019-12-26 NOTE — Telephone Encounter (Signed)
Requested medication (s) are due for refill today: Yes  Requested medication (s) are on the active medication list: Yes  Last refill:  06/27/19  Future visit scheduled: Yes  Notes to clinic:  Last refilled by different provider.    Requested Prescriptions  Pending Prescriptions Disp Refills   simvastatin (ZOCOR) 20 MG tablet [Pharmacy Med Name: SIMVASTATIN 20 MG TAB] 90 tablet 1    Sig: TAKE 1 TABLET BY MOUTH DAILY      Cardiovascular:  Antilipid - Statins Failed - 12/26/2019 10:34 AM      Failed - LDL in normal range and within 360 days    LDL Chol Calc (NIH)  Date Value Ref Range Status  03/18/2019 77 0 - 99 mg/dL Final          Passed - Total Cholesterol in normal range and within 360 days    Cholesterol, Total  Date Value Ref Range Status  03/18/2019 161 100 - 199 mg/dL Final          Passed - HDL in normal range and within 360 days    HDL  Date Value Ref Range Status  03/18/2019 68 >39 mg/dL Final          Passed - Triglycerides in normal range and within 360 days    Triglycerides  Date Value Ref Range Status  03/18/2019 83 0 - 149 mg/dL Final          Passed - Patient is not pregnant      Passed - Valid encounter within last 12 months    Recent Outpatient Visits           2 weeks ago Gastroesophageal reflux disease without esophagitis   [place], FNP   1 month ago Encounter to establish care   [place], FNP   3 months ago Hot flashes   [facility] Merrie Roof [place], [place]   4 months ago Anxiety and depression   [facility] Volney American, [place]   6 months ago Seizures [facility])   [place], Lilia Argue, [place]       Future Appointments             In 1 month [place], Lupita Raider, [facility], [facility]

## 2019-12-30 ENCOUNTER — Other Ambulatory Visit: Payer: Self-pay | Admitting: Family Medicine

## 2019-12-30 NOTE — Telephone Encounter (Signed)
° °  Last refill:  09/27/2019  Future visit scheduled: yes   Notes to clinic:  medication was filled by a different provider Review for refill    Requested Prescriptions  Pending Prescriptions Disp Refills   estrogens, conjugated, ([name]) 0.9 MG tablet 90 tablet 0    Sig: Take 1 tablet (0.9 mg total) by mouth daily.      OB/GYN:  Estrogens Passed - 12/30/2019 10:13 AM      Passed - Mammogram is up-to-date per Health Maintenance      Passed - Last BP in normal range    BP Readings from Last 1 Encounters:  12/08/19 132/68          Passed - Valid encounter within last 12 months    Recent Outpatient Visits           3 weeks ago Gastroesophageal reflux disease without esophagitis   [place], FNP   1 month ago Encounter to establish care   [place], FNP   3 months ago Hot flashes   [facility] Merrie Roof [place], [place]   4 months ago Anxiety and depression   [place], [place]   6 months ago Seizures [facility])   [place], Lilia Argue, [place]       Future Appointments             In 1 month [place], Lupita Raider, [facility], [facility]

## 2019-12-30 NOTE — Telephone Encounter (Signed)
Medication: estrogens, conjugated, ([name]) 0.9 MG tablet [[id number]]   Has the patient contacted their pharmacy? YES  (Agent: If no, request that the patient contact the pharmacy for the refill.) (Agent: If yes, when and what did the pharmacy advise?)  Preferred Pharmacy (with phone number or street name): [facility] [place], [place] - [address]  [place], [place] [address]  Phone:  [phone] Fax:  [phone]   Agent: Please be advised that RX refills may take up to 3 business days. We ask that you follow-up with your pharmacy.

## 2020-01-18 DIAGNOSIS — M25561 Pain in right knee: Secondary | ICD-10-CM | POA: Diagnosis not present

## 2020-01-18 DIAGNOSIS — M5431 Sciatica, right side: Secondary | ICD-10-CM | POA: Diagnosis not present

## 2020-01-23 ENCOUNTER — Other Ambulatory Visit: Payer: Self-pay | Admitting: Family Medicine

## 2020-01-23 DIAGNOSIS — I10 Essential (primary) hypertension: Secondary | ICD-10-CM

## 2020-01-23 NOTE — Telephone Encounter (Signed)
Requested Prescriptions  Pending Prescriptions Disp Refills   verapamil (CALAN-SR) 240 MG CR tablet [Pharmacy Med Name: VERAPAMIL HCL ER 240 MG TAB] 90 tablet 0    Sig: TAKE 1 TABLET BY MOUTH DAILY     Cardiovascular:  Calcium Channel Blockers Passed - 01/23/2020 10:27 AM      Passed - Last BP in normal range    BP Readings from Last 1 Encounters:  12/08/19 132/68         Passed - Valid encounter within last 6 months    Recent Outpatient Visits          1 month ago Gastroesophageal reflux disease without esophagitis   [place], FNP   2 months ago Encounter to establish care   [place], FNP   3 months ago Hot flashes   [facility] Merrie Roof [place], [place]   5 months ago Anxiety and depression   [facility] Volney American, [place]   7 months ago Seizures [facility])   [place], Lilia Argue, [place]      Future Appointments            In 3 weeks [place], Lupita Raider, [facility], [facility]

## 2020-02-01 ENCOUNTER — Other Ambulatory Visit: Payer: Self-pay | Admitting: Family Medicine

## 2020-02-08 ENCOUNTER — Other Ambulatory Visit: Payer: Self-pay | Admitting: Family Medicine

## 2020-02-08 DIAGNOSIS — K219 Gastro-esophageal reflux disease without esophagitis: Secondary | ICD-10-CM

## 2020-02-09 ENCOUNTER — Telehealth: Payer: Self-pay

## 2020-02-09 NOTE — Telephone Encounter (Signed)
Copied from CRM [id number]. Topic: General - Inquiry >> Feb 09, 2020  8:10 AM Adrian Prince D wrote: Reason for LKT:[id number] called ack to say that she does have some pepcid and she doesn't need a refill. Please advise

## 2020-02-17 ENCOUNTER — Other Ambulatory Visit: Payer: Self-pay

## 2020-02-17 ENCOUNTER — Ambulatory Visit (INDEPENDENT_AMBULATORY_CARE_PROVIDER_SITE_OTHER): Payer: Medicare Other | Admitting: Family Medicine

## 2020-02-17 ENCOUNTER — Encounter: Payer: Self-pay | Admitting: Family Medicine

## 2020-02-17 VITALS — BP 141/83 | HR 81 | Temp 97.1°F | Resp 18 | Ht 65.0 in | Wt 137.0 lb

## 2020-02-17 DIAGNOSIS — G8929 Other chronic pain: Secondary | ICD-10-CM | POA: Insufficient documentation

## 2020-02-17 DIAGNOSIS — K219 Gastro-esophageal reflux disease without esophagitis: Secondary | ICD-10-CM | POA: Diagnosis not present

## 2020-02-17 DIAGNOSIS — Z0000 Encounter for general adult medical examination without abnormal findings: Secondary | ICD-10-CM | POA: Diagnosis not present

## 2020-02-17 DIAGNOSIS — M545 Low back pain, unspecified: Secondary | ICD-10-CM | POA: Insufficient documentation

## 2020-02-17 MED ORDER — FAMOTIDINE 10 MG PO TABS: 10.0000 mg | ORAL_TABLET | Freq: Two times a day (BID) | ORAL | 1 refills | Status: DC

## 2020-02-17 NOTE — Assessment & Plan Note (Signed)
Annual physical exam without new findings.  Well adult with no acute concerns.  Plan: 1. Obtain health maintenance screenings as above according to age. - Increase physical activity to 30 minutes most days of the week.  - Eat healthy diet high in vegetables and fruits; low in refined carbohydrates. - Screening labs and tests as ordered - Declines repeat DEXA 2. Return 1 year for annual physical.

## 2020-02-17 NOTE — Patient Instructions (Signed)
A referral to Orthopedics has been placed today.  If you have not heard from the specialty office or our referral coordinator within 1 week, please let us know and we will follow up with the referral coordinator for an update.  Well Visit: Care Instructions Overview  Well visits can help you stay healthy. Your provider has checked your overall health and may have suggested ways to take good care of yourself. Your provider also may have recommended tests. At home, you can help prevent illness with healthy eating, regular exercise, and other steps.  Follow-up care is a key part of your treatment and safety. Be sure to make and go to all appointments, and call your provider if you are having problems. It's also a good idea to know your test results and keep a list of the medicines you take.  How can you care for yourself at home?   Get screening tests that you and your doctor decide on. Screening helps find diseases before any symptoms appear.   Eat healthy foods. Choose fruits, vegetables, whole grains, protein, and low-fat dairy foods. Limit fat, especially saturated fat. Reduce salt in your diet.   Limit alcohol. If you are a man, have no more than 2 drinks a day or 14 drinks a week. If you are a woman, have no more than 1 drink a day or 7 drinks a week.   Get at least 30 minutes of physical activity on most days of the week.  We recommend you go no more than 2 days in a row without exercise. Walking is a good choice. You also may want to do other activities, such as running, swimming, cycling, or playing tennis or team sports. Discuss any changes in your exercise program with your provider.   Reach and stay at a healthy weight. This will lower your risk for many problems, such as obesity, diabetes, heart disease, and high blood pressure.   Do not smoke or allow others to smoke around you. If you need help quitting, talk to your provider about stop-smoking programs and medicines. These can  increase your chances of quitting for good.  Can call 1-800-QUIT-NOW 859-464-3791) for the [facility], assistance with smoking cessation.   Care for your mental health. It is easy to get weighed down by worry and stress. Learn strategies to manage stress, like deep breathing and mindfulness, and stay connected with your family and community. If you find you often feel sad or hopeless, talk with your provider. Treatment can help.   Talk to your provider about whether you have any risk factors for sexually transmitted infections (STIs). You can help prevent STIs if you wait to have sex with a new partner (or partners) until you've each been tested for STIs. It also helps if you use condoms (female or female condoms) and if you limit your sex partners to one person who only has sex with you. Vaccines are available for some STIs, such as HPV (these are age dependent).   If you think you may have a problem with alcohol or drug use, talk to your provider. This includes prescription medicines (such as amphetamines and opioids) and illegal drugs (such as cocaine and methamphetamine). Your provider can help you figure out what type of treatment is best for you.   If you have concerns about domestic violence or intimate partner violence, there are resources available to you. National Domestic Abuse Hotline (507)688-0782   Protect your skin from too much sun. When you're  outdoors from 10 a.m. to 4 p.m., stay in the shade or cover up with clothing and a hat with a wide brim. Wear sunglasses that block UV rays. Even when it's cloudy, put broad-spectrum sunscreen (SPF 30 or higher) on any exposed skin.   See a dentist one or two times a year for checkups and to have your teeth cleaned.   See an eye doctor once per year for an eye exam.   Wear a seat belt in the car.  When should you call for help?  Watch closely for changes in your health, and be sure to contact your provider if you have  any problems or symptoms that concern you.  We will plan to see you back in 12 months for your physical  You will receive a survey after today's visit either digitally by e-mail or paper by [place] mail. Your experiences and feedback matter to [place].  Please respond so we know how we are doing as we provide care for you.  Call us with any questions/concerns/needs.  It is my goal to be available to you for your health concerns.  Thanks for choosing me to be a partner in your healthcare needs!  Harlin Rain, FNP-C Family Nurse Practitioner [facility] Phone: [phone]

## 2020-02-17 NOTE — Progress Notes (Signed)
Subjective:    Patient ID: Kayla Shah, female    DOB: Aug 18, 1955, 65 y.o.   MRN: [id number]  Kayla Shah is a 65 y.o. female presenting on 02/17/2020 for Annual Exam   HPI  HEALTH MAINTENANCE:  Weight/BMI: Normal, BMI 22.80% Diet: Regular Seatbelt: Always Sunscreen: Wears PAP: S/P TAH Mammogram: Last 12/20/2019 DEXA: 02/27/2016 showing Osteoporisis, declines repeat Colon cancer screening: Completed 03/26/2017, Due 03/26/2022 HIV & Hep C Screening: Offered and declined  GC/CT: Offered and declined  Optometry: Yearly  Dentistry: Due to go, will schedule  IMMUNIZATIONS: Tetanus: Declines Influenza: Declines COVID: Declines  Depression screen [facility] 2/9 09/27/2019 05/26/2019 09/08/2018  Decreased Interest 0 2 0  Down, Depressed, Hopeless 0 2 0  PHQ - 2 Score 0 4 0  Altered sleeping 0 0 0  Tired, decreased energy 0 0 0  Change in appetite 0 2 0  Feeling bad or failure about yourself  0 0 0  Trouble concentrating 0 0 1  Moving slowly or fidgety/restless 0 3 0  Suicidal thoughts 0 0 0  PHQ-9 Score 0 9 1  Difficult doing work/chores - Not difficult at all -    Past Medical History:  Diagnosis Date  . Allergy   . Anxiety    agoraphobia   . Arthritis   . Chicken pox   . COPD (chronic obstructive pulmonary disease) ([place])   . Depression   . Family history of adverse reaction to anesthesia    PONV  . GERD (gastroesophageal reflux disease)   . Headache   . History of blood transfusion    1984  . Hyperlipidemia   . Hypertension   . Migraine   . PONV (postoperative nausea and vomiting)   . Seizure [facility])    last seizure 04/2019   Past Surgical History:  Procedure Laterality Date  . ABDOMINAL HYSTERECTOMY  1984   Total  . APPENDECTOMY  1984  . COLONOSCOPY WITH PROPOFOL N/A 03/26/2017   Procedure: COLONOSCOPY WITH PROPOFOL;  Surgeon: Jonathon Bellows, MD;  Location: [facility] ENDOSCOPY;  Service: Gastroenterology;  Laterality: N/A;  . VENTRAL HERNIA REPAIR N/A  09/16/2019   Procedure: HERNIA REPAIR VENTRAL ADULT, Open;  Surgeon: Fredirick Maudlin, MD;  Location: [facility] ORS;  Service: General;  Laterality: N/A;  . WISDOM TOOTH EXTRACTION     Social History   Socioeconomic History  . Marital status: Divorced    Spouse name: Legrand Como  . Number of children: 0  . Years of education: 49  . Highest education level: Not on file  Occupational History    Comment: disabled  Tobacco Use  . Smoking status: Current Every Day Smoker    Packs/day: 1.00    Years: 45.00    Pack years: 45.00    Types: Cigarettes  . Smokeless tobacco: Never Used  . Tobacco comment: reports quitting in feb 2021  Vaping Use  . Vaping Use: Never used  Substance and Sexual Activity  . Alcohol use: No  . Drug use: Yes    Types: Marijuana  . Sexual activity: Never  Other Topics Concern  . Not on file  Social History Narrative   Disabled    Married no kids    No guns, wears seat belt, feels safe in relationship    12th grade ed.    Social Determinants of Health   Financial Resource Strain: Not on file  Food Insecurity: Not on file  Transportation Needs: Not on file  Physical Activity: Not on file  Stress: Not  on file  Social Connections: Not on file  Intimate Partner Violence: Not on file   Family History  Problem Relation Age of Onset  . Stroke Mother   . Depression Mother   . Arthritis Mother   . Heart disease Father   . Alcohol abuse Father   . Diabetes Father   . Osteoporosis Sister   . Kidney disease Sister   . Arthritis Sister   . Arthritis Maternal Grandmother   . Heart disease Maternal Grandmother   . Hyperlipidemia Maternal Grandmother   . Diabetes Paternal Grandmother   . Breast cancer Neg Hx    Current Outpatient Medications on File Prior to Visit  Medication Sig  . ARIPiprazole (ABILIFY) 2 MG tablet Take 2 mg by mouth daily.  . diazepam (VALIUM) 5 MG tablet Take 5 mg by mouth 3 (three) times daily as needed.  . lamoTRIgine (LAMICTAL) 100 MG  tablet Take 1 tablet (100 mg total) by mouth 2 (two) times daily.  . pantoprazole (PROTONIX) 40 MG tablet TAKE 1 TABLET BY MOUTH TWICE A DAY  . [name] 0.9 MG tablet TAKE 1 TABLET BY MOUTH DAILY  . promethazine (PHENERGAN) 12.5 MG tablet Take 1 tablet (12.5 mg total) by mouth every 8 (eight) hours as needed for nausea or vomiting.  . rizatriptan (MAXALT-MLT) 10 MG disintegrating tablet Take 1 tablet (10 mg total) by mouth as needed for migraine. May repeat in 2 hours if needed  . simvastatin (ZOCOR) 20 MG tablet Take 1 tablet (20 mg total) by mouth daily at 6 PM.  . verapamil (CALAN-SR) 240 MG CR tablet TAKE 1 TABLET BY MOUTH DAILY   No current facility-administered medications on file prior to visit.    Per HPI unless specifically indicated above     Objective:    BP (!) 141/83 (BP Location: Right Arm, Patient Position: Sitting, Cuff Size: Normal)   Pulse 81   Temp (!) 97.1 F (36.2 C)   Resp 18   Ht 5' 5"  (1.651 m)   Wt 137 lb (62.1 kg)   SpO2 99%   BMI 22.80 kg/m   Wt Readings from Last 3 Encounters:  02/17/20 137 lb (62.1 kg)  12/08/19 131 lb 9.6 oz (59.7 kg)  11/11/19 127 lb 9.6 oz (57.9 kg)    Physical Exam Vitals and nursing note reviewed.  Constitutional:      General: She is not in acute distress.    Appearance: Normal appearance. She is well-developed, well-groomed and normal weight. She is not ill-appearing or toxic-appearing.  HENT:     Head: Normocephalic and atraumatic.     Right Ear: Tympanic membrane, ear canal and external ear normal. There is no impacted cerumen.     Left Ear: Tympanic membrane, ear canal and external ear normal. There is no impacted cerumen.     Nose: Nose normal. No congestion or rhinorrhea.     Mouth/Throat:     Lips: Pink.     Mouth: Mucous membranes are moist.     Pharynx: Oropharynx is clear. Uvula midline. No oropharyngeal exudate or posterior oropharyngeal erythema.  Eyes:     General: Lids are normal. Vision grossly intact. No  scleral icterus.       Right eye: No discharge.        Left eye: No discharge.     Extraocular Movements: Extraocular movements intact.     Conjunctiva/sclera: Conjunctivae normal.     Pupils: Pupils are equal, round, and reactive to light.  Neck:  Thyroid: No thyroid mass or thyromegaly.  Cardiovascular:     Rate and Rhythm: Normal rate and regular rhythm.     Pulses: Normal pulses.          Dorsalis pedis pulses are 2+ on the right side and 2+ on the left side.     Heart sounds: Normal heart sounds. No murmur heard. No friction rub. No gallop.   Pulmonary:     Effort: Pulmonary effort is normal. No respiratory distress.     Breath sounds: Normal breath sounds.  Abdominal:     General: Abdomen is flat. Bowel sounds are normal. There is no distension.     Palpations: Abdomen is soft. There is no hepatomegaly, splenomegaly or mass.     Tenderness: There is no abdominal tenderness. There is no guarding or rebound.     Hernia: No hernia is present.  Musculoskeletal:        General: Normal range of motion.     Cervical back: Normal range of motion and neck supple. No tenderness.     Right lower leg: No edema.     Left lower leg: No edema.     Comments: Normal tone, strength 5/5 BUE & BLE  Feet:     Right foot:     Skin integrity: Skin integrity normal.     Left foot:     Skin integrity: Skin integrity normal.  Lymphadenopathy:     Cervical: No cervical adenopathy.  Skin:    General: Skin is warm and dry.     Capillary Refill: Capillary refill takes less than 2 seconds.  Neurological:     General: No focal deficit present.     Mental Status: She is alert and oriented to person, place, and time.     Cranial Nerves: No cranial nerve deficit.     Sensory: No sensory deficit.     Motor: No weakness.     Coordination: Coordination normal.     Gait: Gait normal.     Deep Tendon Reflexes: Reflexes normal.  Psychiatric:        Attention and Perception: Attention and perception  normal.        Mood and Affect: Affect normal. Mood is anxious.        Speech: Speech normal.        Behavior: Behavior normal. Behavior is cooperative.        Thought Content: Thought content normal.        Cognition and Memory: Cognition and memory normal.        Judgment: Judgment normal.     Results for orders placed or performed in visit on 11/11/19  POCT Urinalysis Dipstick  Result Value Ref Range   Color, UA yellow    Clarity, UA clear    Glucose, UA Negative Negative   Bilirubin, UA neg    Ketones, UA neg    Spec Grav, UA 1.010 1.010 - 1.025   Blood, UA neg    pH, UA 7.5 5.0 - 8.0   Protein, UA Negative Negative   Urobilinogen, UA 0.2 0.2 or 1.0 E.U./dL   Nitrite, UA neg    Leukocytes, UA Negative Negative   Appearance     Odor    POCT glycosylated hemoglobin (Hb A1C)  Result Value Ref Range   Hemoglobin A1C 5.1 4.0 - 5.6 %   HbA1c POC (<> result, manual entry)     HbA1c, POC (prediabetic range)     HbA1c, POC (controlled diabetic range)  Assessment & Plan:   Problem List Items Addressed This Visit      Digestive   Gastroesophageal reflux disease without esophagitis    Currently on famotidine 20m BID and has stopped the protonix since our last visit with continued nausea.  Reports has switched to promethazine for nausea without improvement in symptoms.  Has not met with gastroenterology, and encouraged this evaluation.  Discussed at 12/08/19 visit will not refill promethazine without gastro evaluation. Patient declines.       Relevant Medications   famotidine (PEPCID) 10 MG tablet     Other   Chronic bilateral low back pain without sciatica    [place] relief in symptoms.  Reports no relief with NSAIDs or muscle relaxers.  Encouraged re-evaluation with orthopedics, provided with referral.      Relevant Orders   AMB referral to orthopedics   Annual physical exam - Primary    Annual  physical exam without new findings.  Well adult with no acute concerns.  Plan: 1. Obtain health maintenance screenings as above according to age. - Increase physical activity to 30 minutes most days of the week.  - Eat healthy diet high in vegetables and fruits; low in refined carbohydrates. - Screening labs and tests as ordered - Declines repeat DEXA 2. Return 1 year for annual physical.          Meds ordered this encounter  Medications  . famotidine (PEPCID) 10 MG tablet    Sig: Take 1 tablet (10 mg total) by mouth 2 (two) times daily.    Dispense:  60 tablet    Refill:  1   Follow up plan: Return in about 1 year (around 02/16/2021) for CPE.  NHarlin Rain FNP-C Family Nurse Practitioner [facility] 02/17/2020, 9:53 AM

## 2020-02-17 NOTE — Assessment & Plan Note (Signed)
Currently on famotidine 89m BID and has stopped the protonix since our last visit with continued nausea.  Reports has switched to promethazine for nausea without improvement in symptoms.  Has not met with gastroenterology, and encouraged this evaluation.  Discussed at 12/08/19 visit will not refill promethazine without gastro evaluation. Patient declines.

## 2020-02-17 NOTE — Assessment & Plan Note (Signed)
Has met with a provider in [place] that put on medrol dose pack without relief in symptoms.  Reports no relief with NSAIDs or muscle relaxers.  Encouraged re-evaluation with orthopedics, provided with referral.

## 2020-02-20 ENCOUNTER — Telehealth: Payer: Self-pay | Admitting: Family Medicine

## 2020-02-20 NOTE — Telephone Encounter (Signed)
Pt decline to schedule covid vaccine at this time

## 2020-02-27 DIAGNOSIS — M25551 Pain in right hip: Secondary | ICD-10-CM | POA: Diagnosis not present

## 2020-02-27 DIAGNOSIS — M545 Low back pain, unspecified: Secondary | ICD-10-CM | POA: Diagnosis not present

## 2020-03-01 ENCOUNTER — Other Ambulatory Visit: Payer: Self-pay

## 2020-03-01 DIAGNOSIS — N951 Menopausal and female climacteric states: Secondary | ICD-10-CM

## 2020-03-01 MED ORDER — ESTROGENS CONJUGATED 0.9 MG PO TABS
ORAL_TABLET | ORAL | 2 refills | Status: DC
Start: 2020-03-01 — End: 2020-05-10

## 2020-03-07 ENCOUNTER — Other Ambulatory Visit: Payer: Self-pay

## 2020-03-07 ENCOUNTER — Ambulatory Visit: Payer: Self-pay | Admitting: Family Medicine

## 2020-03-07 ENCOUNTER — Encounter: Payer: Self-pay | Admitting: Emergency Medicine

## 2020-03-07 ENCOUNTER — Emergency Department: Payer: Medicare Other

## 2020-03-07 ENCOUNTER — Emergency Department
Admission: EM | Admit: 2020-03-07 | Discharge: 2020-03-07 | Disposition: A | Discharge status: Home / Self Care | Payer: Medicare Other | Attending: Emergency Medicine | Admitting: Emergency Medicine

## 2020-03-07 DIAGNOSIS — F17210 Nicotine dependence, cigarettes, uncomplicated: Secondary | ICD-10-CM | POA: Diagnosis not present

## 2020-03-07 DIAGNOSIS — I2510 Atherosclerotic heart disease of native coronary artery without angina pectoris: Secondary | ICD-10-CM | POA: Diagnosis not present

## 2020-03-07 DIAGNOSIS — J449 Chronic obstructive pulmonary disease, unspecified: Secondary | ICD-10-CM | POA: Insufficient documentation

## 2020-03-07 DIAGNOSIS — M5441 Lumbago with sciatica, right side: Secondary | ICD-10-CM | POA: Diagnosis not present

## 2020-03-07 DIAGNOSIS — Z79899 Other long term (current) drug therapy: Secondary | ICD-10-CM | POA: Diagnosis not present

## 2020-03-07 DIAGNOSIS — I10 Essential (primary) hypertension: Secondary | ICD-10-CM | POA: Insufficient documentation

## 2020-03-07 DIAGNOSIS — M545 Low back pain, unspecified: Secondary | ICD-10-CM | POA: Diagnosis not present

## 2020-03-07 MED ORDER — ONDANSETRON 4 MG PO TBDP
4.0000 mg | ORAL_TABLET | Freq: Once | ORAL | Status: AC
  Administered 2020-03-07: 4 mg via ORAL
  Filled 2020-03-07: qty 1

## 2020-03-07 MED ORDER — ONDANSETRON 4 MG PO TBDP: 4.0000 mg | ORAL_TABLET | Freq: Three times a day (TID) | ORAL | 0 refills | Status: AC | PRN

## 2020-03-07 MED ORDER — OXYCODONE-ACETAMINOPHEN 5-325 MG PO TABS
1.0000 | ORAL_TABLET | Freq: Four times a day (QID) | ORAL | 0 refills | Status: AC | PRN
Start: 2020-03-07 — End: 2020-03-09

## 2020-03-07 MED ORDER — OXYCODONE-ACETAMINOPHEN 5-325 MG PO TABS
1.0000 | ORAL_TABLET | Freq: Once | ORAL | Status: AC
  Administered 2020-03-07: 1 via ORAL
  Filled 2020-03-07: qty 1

## 2020-03-07 MED ORDER — PREDNISONE 10 MG (21) PO TBPK: ORAL_TABLET | ORAL | 0 refills | Status: DC

## 2020-03-07 NOTE — Discharge Instructions (Addendum)
Take tapered steroid as directed. Your MRI showed findings concerning for moderate spinal stenosis. Please follow-up with neurosurgeon, Dr. Cari Caraway. You have been prescribed a 2-day course of Percocet for pain.

## 2020-03-07 NOTE — ED Provider Notes (Signed)
[facility]-EMERGENCY DEPARTMENT  ____________________________________________  Time seen: Approximately 4:12 PM  I have reviewed the triage vital signs and the nursing notes.   HISTORY  Chief Complaint Pain   Historian Patient     HPI Kayla Shah is a 65 y.o. female presents to the emergency department with acute on chronic low back pain.  Patient states that she initially developed mild low back pain in November after helping her niece move from a third floor apartment.  She states that low back pain is progressively worsened and is causing radiating pain down the posterior aspect of her right lower extremity.  Patient states that she ordinarily does not have to walk with a cane or walker and is only able to ambulate now with a walker.  She denies bowel or bladder incontinence or saddle anesthesia.  Patient states that she saw an urgent care provider who took an x-ray and started her on prednisone and Zanaflex.  Patient states that she finished prednisone as directed and noticed no changes in her symptoms.  She states that Zanaflex only makes her sleepy and patient is frustrated as she states that she cannot continue to take medication that just makes her sleep for treatment.  She denies experiencing similar symptoms in the past.  No other alleviating measures have been attempted.   Past Medical History:  Diagnosis Date  . Allergy   . Anxiety    agoraphobia   . Arthritis   . Chicken pox   . COPD (chronic obstructive pulmonary disease) ([place])   . Depression   . Family history of adverse reaction to anesthesia    PONV  . GERD (gastroesophageal reflux disease)   . Headache   . History of blood transfusion    1984  . Hyperlipidemia   . Hypertension   . Migraine   . PONV (postoperative nausea and vomiting)   . Seizure ([place])    last seizure 04/2019     Immunizations up to date:  Yes.     Past Medical History:  Diagnosis Date  . Allergy   . Anxiety    agoraphobia    . Arthritis   . Chicken pox   . COPD (chronic obstructive pulmonary disease) ([place])   . Depression   . Family history of adverse reaction to anesthesia    PONV  . GERD (gastroesophageal reflux disease)   . Headache   . History of blood transfusion    1984  . Hyperlipidemia   . Hypertension   . Migraine   . PONV (postoperative nausea and vomiting)   . Seizure [facility])    last seizure 04/2019    Patient Active Problem List   Diagnosis Date Noted  . Chronic bilateral low back pain without sciatica 02/17/2020  . Annual physical exam 02/17/2020  . Nausea 12/08/2019  . Encounter to establish care 11/11/2019  . Elevated glucose level 11/11/2019  . Encounter for screening mammogram for malignant neoplasm of breast 11/11/2019  . Status post repair of ventral hernia 10/13/2019  . Ventral hernia without obstruction or gangrene 08/30/2019  . Underweight 06/02/2019  . Intractable nausea and vomiting 05/01/2019  . Hypokalemia 05/01/2019  . Elevated troponin 06/12/2018  . Lung nodule 10/08/2017  . Anxiety and depression 07/30/2017  . Agoraphobia 07/30/2017  . CAD (coronary artery disease) 07/30/2017  . Hot flashes due to surgical menopause 07/30/2017  . Insomnia 07/30/2017  . Osteoporosis 02/27/2016  . Personal history of tobacco use, presenting hazards to health 10/23/2015  . Primary osteoarthritis  of left elbow 10/11/2015  . Migraine 06/07/2015  . Seizures ([place]) 06/07/2015  . Smoker 06/07/2015  . Gastroesophageal reflux disease without esophagitis 06/07/2015  . COPD (chronic obstructive pulmonary disease) with chronic bronchitis ([place]) 08/14/2014  . Essential (primary) hypertension 08/14/2014  . Pure hypercholesterolemia 08/14/2014  . Abnormal brain scan 08/29/2013  . Depression 08/29/2013    Past Surgical History:  Procedure Laterality Date  . ABDOMINAL HYSTERECTOMY  1984   Total  . APPENDECTOMY  1984  . COLONOSCOPY WITH PROPOFOL N/A 03/26/2017   Procedure: COLONOSCOPY WITH  PROPOFOL;  Surgeon: Jonathon Bellows, MD;  Location[facility] ENDOSCOPY;  Service: Gastroenterology;  Laterality: N/A;  . VENTRAL HERNIA REPAIR N/A 09/16/2019   Procedure: HERNIA REPAIR VENTRAL ADULT, Open;  Surgeon: Fredirick Maudlin, MD;  Location: [facility] ORS;  Service: General;  Laterality: N/A;  . WISDOM TOOTH EXTRACTION      Prior to Admission medications   Medication Sig Start Date End Date Taking? Authorizing Provider  ondansetron (ZOFRAN ODT) 4 MG disintegrating tablet Take 1 tablet (4 mg total) by mouth every 8 (eight) hours as needed for up to 5 days. 03/07/20 03/12/20 Yes Vallarie Mare M, PA-C  oxyCODONE-acetaminophen (PERCOCET/ROXICET) 5-325 MG tablet Take 1 tablet by mouth every 6 (six) hours as needed for up to 2 days. 03/07/20 03/09/20 Yes Vallarie Mare M, PA-C  predniSONE (STERAPRED UNI-PAK 21 TAB) 10 MG (21) TBPK tablet 6,6,5,5,4,4,3,3,2,2,1,1 03/07/20  Yes Sherral Hammers, Anniebell Bedore M, PA-C  ARIPiprazole (ABILIFY) 2 MG tablet Take 2 mg by mouth daily. 02/09/20   [provider]  diazepam (VALIUM) 5 MG tablet Take 5 mg by mouth 3 (three) times daily as needed. 02/09/20   [provider]  estrogens, conjugated, ([name]) 0.9 MG tablet Take daily for 21 days then do not take for 7 days. 03/01/20   Karamalegos, Devonne Doughty, DO  famotidine (PEPCID) 10 MG tablet Take 1 tablet (10 mg total) by mouth 2 (two) times daily. 02/17/20   Malfi, Lupita Raider, FNP  lamoTRIgine (LAMICTAL) 100 MG tablet Take 1 tablet (100 mg total) by mouth 2 (two) times daily. 08/15/19   Lomax, [name], NP  pantoprazole (PROTONIX) 40 MG tablet TAKE 1 TABLET BY MOUTH TWICE A DAY 02/08/20   Malfi, Lupita Raider, FNP  promethazine (PHENERGAN) 12.5 MG tablet Take 1 tablet (12.5 mg total) by mouth every 8 (eight) hours as needed for nausea or vomiting. 12/08/19   Malfi, Lupita Raider, FNP  rizatriptan (MAXALT-MLT) 10 MG disintegrating tablet Take 1 tablet (10 mg total) by mouth as needed for migraine. May repeat in 2 hours if needed 05/12/19   Lomax, [name], NP   simvastatin (ZOCOR) 20 MG tablet Take 1 tablet (20 mg total) by mouth daily at 6 PM. 12/27/19   Karamalegos, Devonne Doughty, DO  verapamil (CALAN-SR) 240 MG CR tablet TAKE 1 TABLET BY MOUTH DAILY 01/23/20   Malfi, Lupita Raider, FNP    Allergies Codeine, Fluoxetine, and Paroxetine  Family History  Problem Relation Age of Onset  . Stroke Mother   . Depression Mother   . Arthritis Mother   . Heart disease Father   . Alcohol abuse Father   . Diabetes Father   . Osteoporosis Sister   . Kidney disease Sister   . Arthritis Sister   . Arthritis Maternal Grandmother   . Heart disease Maternal Grandmother   . Hyperlipidemia Maternal Grandmother   . Diabetes Paternal Grandmother   . Breast cancer Neg Hx     Social History Social History  Tobacco Use  . Smoking status: Current Every Day Smoker    Packs/day: 1.00    Years: 45.00    Pack years: 45.00    Types: Cigarettes  . Smokeless tobacco: Never Used  . Tobacco comment: reports quitting in feb 2021  Vaping Use  . Vaping Use: Never used  Substance Use Topics  . Alcohol use: No  . Drug use: Yes    Types: Marijuana     Review of Systems  Constitutional: No fever/chills Eyes:  No discharge ENT: No upper respiratory complaints. Respiratory: no cough. No SOB/ use of accessory muscles to breath Gastrointestinal:   No nausea, no vomiting.  No diarrhea.  No constipation. Musculoskeletal: Patient has low back pain.  Skin: Negative for rash, abrasions, lacerations, ecchymosis.    ____________________________________________   PHYSICAL EXAM:  VITAL SIGNS: ED Triage Vitals  Enc Vitals Group     BP 03/07/20 1551 (!) 144/76     Pulse Rate 03/07/20 1551 89     Resp 03/07/20 1551 15     Temp 03/07/20 1551 97.7 F (36.5 C)     Temp Source 03/07/20 1551 Oral     SpO2 --      Weight 03/07/20 1551 135 lb (61.2 kg)     Height 03/07/20 1551 5\' 5"  (1.651 m)     Head Circumference --      Peak Flow --      Pain Score 03/07/20 1551  10     Pain Loc --      Pain Edu? --      Excl. in [place]? --      Constitutional: Alert and oriented. Well appearing and in no acute distress. Eyes: Conjunctivae are normal. PERRL. EOMI. Head: Atraumatic. Cardiovascular: Normal rate, regular rhythm. Normal S1 and S2.  Good peripheral circulation. Respiratory: Normal respiratory effort without tachypnea or retractions. Lungs CTAB. Good air entry to the bases with no decreased or absent breath sounds Gastrointestinal: Bowel sounds x 4 quadrants. Soft and nontender to palpation. No guarding or rigidity. No distention. Musculoskeletal: Full range of motion to all extremities. No obvious deformities noted. Positive straight leg raise test, right. Patient has paraspinal muscle tenderness along the lumbar spine on the right.  Neurologic:  Normal for age. No gross focal neurologic deficits are appreciated.  Skin:  Skin is warm, dry and intact. No rash noted. Psychiatric: Mood and affect are normal for age. Speech and behavior are normal.   ____________________________________________   LABS (all labs ordered are listed, but only abnormal results are displayed)  Labs Reviewed - No data to display ____________________________________________  EKG   ____________________________________________  RADIOLOGY Unk Pinto, personally viewed and evaluated these images (plain radiographs) as part of my medical decision making, as well as reviewing the written report by the radiologist.    MR LUMBAR SPINE WO CONTRAST  Result Date: 03/07/2020 CLINICAL DATA:  Low back pain. Right lower extremity pain extending from the hip to the knee. EXAM: MRI LUMBAR SPINE WITHOUT CONTRAST TECHNIQUE: Multiplanar, multisequence MR imaging of the lumbar spine was performed. No intravenous contrast was administered. COMPARISON:  CT abdomen and pelvis 05/01/2019 FINDINGS: Segmentation:  Standard. Alignment:  Trace facet mediated anterolisthesis of L4 on L5.  Vertebrae: Mild chronic T12 and L2 superior endplate compression fractures, unchanged. No acute fracture or suspicious marrow lesion. Mild marrow edema about the L4-5 facets bilaterally, degenerative in appearance. Conus medullaris and cauda equina: Conus extends to the L1 level. Conus and cauda equina appear  normal. Paraspinal and other soft tissues: Unremarkable. Disc levels: Disc desiccation throughout the lumbar spine. Severe disc space narrowing at L3-4 and L5-S1 and mild narrowing at L4-5. L1-2: Mild disc bulging and mild facet and ligamentum flavum hypertrophy without stenosis. L2-3: Mild disc bulging and mild facet and ligamentum flavum hypertrophy without stenosis. L3-4: Circumferential disc bulging and mild-to-moderate facet and ligamentum flavum hypertrophy result in mild to moderate left greater than right lateral recess stenosis and mild spinal stenosis without neural foraminal stenosis. L4-5: Anterolisthesis with mild bulging of uncovered disc and moderate to severe facet and ligamentum flavum hypertrophy result in moderate spinal stenosis and mild bilateral lateral recess stenosis without neural foraminal stenosis. Small synovial cysts posterior to the facet joints bilaterally, not in a position to cause neural impingement. L5-S1: Disc bulging, endplate spurring, severe disc space height loss, and mild facet hypertrophy result in mild bilateral lateral recess stenosis and mild left greater than right neural foraminal stenosis without spinal stenosis. IMPRESSION: 1. Multilevel lumbar disc and facet degeneration, most notable at L4-5 where there is moderate spinal stenosis. 2. Mild-to-moderate lateral recess stenosis and mild spinal stenosis at L3-4. 3. Mild lateral recess and neural foraminal stenosis at L5-S1. Electronically Signed   By: Logan Bores M.D.   On: 03/07/2020 17:12    ____________________________________________    PROCEDURES  Procedure(s) performed:      Procedures     Medications  oxyCODONE-acetaminophen (PERCOCET/ROXICET) 5-325 MG per tablet 1 tablet (1 tablet Oral Given 03/07/20 1616)  ondansetron (ZOFRAN-ODT) disintegrating tablet 4 mg (4 mg Oral Given 03/07/20 1616)     ____________________________________________   INITIAL IMPRESSION / ASSESSMENT AND PLAN / ED COURSE  Pertinent labs & imaging results that were available during my care of the patient were reviewed by me and considered in my medical decision making (see chart for details).       Assessment and Plan:  Low back pain 65 year old female presents to the emergency department with low back pain since November that has worsened in intensity and now radiates along the posterior aspect of the right lower extremity.  Patient has also had associated changes in gait and is now dependent on a walker.  This is the second time the patient has been seen and evaluated and has already had a prior x-ray of the lumbar spine and has finished a course of prednisone.  Patient had a positive straight leg raise test on exam and seemed particularly uncomfortable.  MRI of the lumbar spine showed moderate spinal stenosis at L4-L5.  We will start patient on a 2-week taper of prednisone and will refer to neurosurgeon, Dr. Cari Caraway.  Patient was also given a 2-day course of Percocet for pain.  Patient was cautioned that Percocet can be constipating and was advised to use a stool softener and use pain medication sparingly.   ____________________________________________  FINAL CLINICAL IMPRESSION(S) / ED DIAGNOSES  Final diagnoses:  Acute bilateral low back pain with right-sided sciatica      NEW MEDICATIONS STARTED DURING THIS VISIT:  ED Discharge Orders         Ordered    oxyCODONE-acetaminophen (PERCOCET/ROXICET) 5-325 MG tablet  Every 6 hours PRN        03/07/20 1746    predniSONE (STERAPRED UNI-PAK 21 TAB) 10 MG (21) TBPK tablet        03/07/20 1746    ondansetron  (ZOFRAN ODT) 4 MG disintegrating tablet  Every 8 hours PRN        03/07/20 1746  This chart was dictated using voice recognition software/Dragon. Despite best efforts to proofread, errors can occur which can change the meaning. Any change was purely unintentional.     Lannie Fields, PA-C 03/07/20 1802    Vanessa Erwin, MD 03/07/20 [phone]

## 2020-03-07 NOTE — ED Notes (Signed)
See triage note  Presents with pain to right side  Pain starts at right hip and moves down to knee  States pain started in Nov

## 2020-03-07 NOTE — ED Triage Notes (Signed)
Pt comes into the ED via POV c/o right side pain in general from the shoulder all the way down to the leg.  Pt denies any falls.  Pt states this has been ongoing since November when she helped move someone and their things from a 3rd floor apartment.  Pt admits to arthritis.  Pt in NAD at this time with even and unlabored respirations.  Pt ambulatory at home.

## 2020-03-07 NOTE — Telephone Encounter (Signed)
Pt reports "15/10" back pain. States right sided, lower back, into buttocks, hip and right knee." States has been to two orthopedists "No help whatsoever." States pain is constant, onset November 2021.Denies any numbness, no bowel/bladder issues. States is now ambulating  with walker. Was prescribed muscle relaxer "Knocks me out so I don't take it."  Agent scheduled appt with Dr. Raliegh Ip 03/16/20 prior to triage. Pt requesting referral to ortho "In [facility] or somewhere that can help me." Advised ED, 15/10 pain; states "That's for life or death situations, I don't need that."  Advised ED if symptoms worsen, numbness, B/B issues. Care advise given per protocol. Pt verbalizes understanding.  CB# [phone]  Reason for Disposition . [1] SEVERE back pain (e.g., excruciating, unable to do any normal activities) AND [2] not improved 2 hours after pain medicine  Answer Assessment - Initial Assessment Questions 1. ONSET: "When did the pain begin?"      November 2021 2. LOCATION: "Where does it hurt?" (upper, mid or lower back)     Right hip, right lower back, right knee 3. SEVERITY: "How bad is the pain?"  (e.g., Scale 1-10; mild, moderate, or severe)   - MILD (1-3): doesn't interfere with normal activities    - MODERATE (4-7): interferes with normal activities or awakens from sleep    - SEVERE (8-10): excruciating pain, unable to do any normal activities      15/10 4. PATTERN: "Is the pain constant?" (e.g., yes, no; constant, intermittent)      Constant 5. RADIATION: "Does the pain shoot into your legs or elsewhere?"     Goes down to knee 6. CAUSE:  "What do you think is causing the back pain?"     Unsure 7. BACK OVERUSE:  "Any recent lifting of heavy objects, strenuous work or exercise?"     No 8. MEDICATIONS: "What have you taken so far for the pain?" (e.g., nothing, acetaminophen, NSAIDS)     Muscle relaxers, "Knock me out." 9. NEUROLOGIC SYMPTOMS: "Do you have any weakness, numbness, or  problems with bowel/bladder control?"     none 10. OTHER SYMPTOMS: "Do you have any other symptoms?" (e.g., fever, abdominal pain, burning with urination, blood in urine) no  Protocols used: BACK PAIN-A-AH

## 2020-03-14 ENCOUNTER — Telehealth: Payer: Self-pay

## 2020-03-14 DIAGNOSIS — G8929 Other chronic pain: Secondary | ICD-10-CM

## 2020-03-14 DIAGNOSIS — M545 Low back pain, unspecified: Secondary | ICD-10-CM

## 2020-03-14 MED ORDER — GABAPENTIN 300 MG PO CAPS: 300.0000 mg | ORAL_CAPSULE | Freq: Two times a day (BID) | ORAL | 1 refills | Status: DC

## 2020-03-14 NOTE — Telephone Encounter (Signed)
Pharmacy requesting a refill on Gabapentin 300MG  1 tablet twice daily as need. Its no longer listed in the patient chart. Please advise

## 2020-03-16 ENCOUNTER — Ambulatory Visit: Payer: Medicare Other | Admitting: Family Medicine

## 2020-03-21 DIAGNOSIS — M48061 Spinal stenosis, lumbar region without neurogenic claudication: Secondary | ICD-10-CM | POA: Diagnosis not present

## 2020-03-21 DIAGNOSIS — M5442 Lumbago with sciatica, left side: Secondary | ICD-10-CM | POA: Diagnosis not present

## 2020-03-21 DIAGNOSIS — M5441 Lumbago with sciatica, right side: Secondary | ICD-10-CM | POA: Diagnosis not present

## 2020-03-29 DIAGNOSIS — H2513 Age-related nuclear cataract, bilateral: Secondary | ICD-10-CM | POA: Diagnosis not present

## 2020-03-29 DIAGNOSIS — H25043 Posterior subcapsular polar age-related cataract, bilateral: Secondary | ICD-10-CM | POA: Diagnosis not present

## 2020-04-03 ENCOUNTER — Ambulatory Visit: Payer: Medicare Other | Admitting: Family Medicine

## 2020-04-09 ENCOUNTER — Other Ambulatory Visit: Payer: Self-pay

## 2020-04-09 DIAGNOSIS — K219 Gastro-esophageal reflux disease without esophagitis: Secondary | ICD-10-CM

## 2020-04-09 MED ORDER — FAMOTIDINE 10 MG PO TABS: 10.0000 mg | ORAL_TABLET | Freq: Two times a day (BID) | ORAL | 1 refills | Status: DC

## 2020-04-09 NOTE — Progress Notes (Signed)
Refill on famotidine sent to [facility]

## 2020-04-10 ENCOUNTER — Other Ambulatory Visit: Payer: Self-pay

## 2020-04-10 DIAGNOSIS — K219 Gastro-esophageal reflux disease without esophagitis: Secondary | ICD-10-CM

## 2020-04-28 ENCOUNTER — Emergency Department
Admission: EM | Admit: 2020-04-28 | Discharge: 2020-04-28 | Disposition: A | Discharge status: Home / Self Care | Payer: Medicare Other | Attending: Emergency Medicine | Admitting: Emergency Medicine

## 2020-04-28 ENCOUNTER — Emergency Department: Payer: Medicare Other

## 2020-04-28 ENCOUNTER — Other Ambulatory Visit: Payer: Self-pay

## 2020-04-28 DIAGNOSIS — W19XXXA Unspecified fall, initial encounter: Secondary | ICD-10-CM | POA: Diagnosis not present

## 2020-04-28 DIAGNOSIS — M25551 Pain in right hip: Secondary | ICD-10-CM | POA: Diagnosis not present

## 2020-04-28 DIAGNOSIS — I10 Essential (primary) hypertension: Secondary | ICD-10-CM | POA: Diagnosis not present

## 2020-04-28 DIAGNOSIS — R1030 Lower abdominal pain, unspecified: Secondary | ICD-10-CM | POA: Diagnosis not present

## 2020-04-28 DIAGNOSIS — M545 Low back pain, unspecified: Secondary | ICD-10-CM | POA: Diagnosis present

## 2020-04-28 DIAGNOSIS — J449 Chronic obstructive pulmonary disease, unspecified: Secondary | ICD-10-CM | POA: Insufficient documentation

## 2020-04-28 DIAGNOSIS — F17210 Nicotine dependence, cigarettes, uncomplicated: Secondary | ICD-10-CM | POA: Diagnosis not present

## 2020-04-28 DIAGNOSIS — M5441 Lumbago with sciatica, right side: Secondary | ICD-10-CM

## 2020-04-28 DIAGNOSIS — I2510 Atherosclerotic heart disease of native coronary artery without angina pectoris: Secondary | ICD-10-CM | POA: Insufficient documentation

## 2020-04-28 MED ORDER — ONDANSETRON 4 MG PO TBDP
4.0000 mg | ORAL_TABLET | Freq: Once | ORAL | Status: AC
  Administered 2020-04-28: 4 mg via ORAL
  Filled 2020-04-28: qty 1

## 2020-04-28 MED ORDER — OXYCODONE-ACETAMINOPHEN 5-325 MG PO TABS: 1.0000 | ORAL_TABLET | Freq: Four times a day (QID) | ORAL | 0 refills | Status: AC | PRN

## 2020-04-28 MED ORDER — FENTANYL CITRATE (PF) 100 MCG/2ML IJ SOLN
50.0000 ug | Freq: Once | INTRAMUSCULAR | Status: AC
  Administered 2020-04-28: 50 ug via INTRAMUSCULAR
  Filled 2020-04-28: qty 2

## 2020-04-28 MED ORDER — METHOCARBAMOL 500 MG PO TABS
750.0000 mg | ORAL_TABLET | Freq: Once | ORAL | Status: AC
  Administered 2020-04-28: 750 mg via ORAL
  Filled 2020-04-28: qty 2

## 2020-04-28 MED ORDER — MELOXICAM 7.5 MG PO TABS
15.0000 mg | ORAL_TABLET | Freq: Once | ORAL | Status: AC
  Administered 2020-04-28: 15 mg via ORAL
  Filled 2020-04-28: qty 2

## 2020-04-28 MED ORDER — METHOCARBAMOL 500 MG PO TABS: 500.0000 mg | ORAL_TABLET | Freq: Four times a day (QID) | ORAL | 0 refills | Status: DC

## 2020-04-28 MED ORDER — MELOXICAM 15 MG PO TABS: 15.0000 mg | ORAL_TABLET | Freq: Every day | ORAL | 0 refills | Status: DC

## 2020-04-28 MED ORDER — OXYCODONE-ACETAMINOPHEN 5-325 MG PO TABS
1.0000 | ORAL_TABLET | Freq: Once | ORAL | Status: AC
  Administered 2020-04-28: 1 via ORAL
  Filled 2020-04-28: qty 1

## 2020-04-28 NOTE — ED Provider Notes (Signed)
  Physical Exam  BP (!) 149/89   Pulse 81   Temp 99 F (37.2 C) (Oral)   Resp 16   SpO2 99%   Physical Exam  ED Course/Procedures     Procedures  MDM  Assuming care from Ashok Cordia, PA-C.  In summary, patient has chronic back pain, seen at [facility] yesterday for evaluation and injection.  She had a fall since that time, complaining of worsening back pain with associated right hip pain.  X-ray was obtained of the hip, suggestive of possible avascular necrosis.  CT was then ordered by previous provider of the lumbar spine and hip.  Radiology read of these is negative for acute fracture, there does appear to be chronic changes, particularly with the L4-L5 space.  There is also concern for avascular necrosis of the right femoral head.  The patient is already established with [facility].  We will treat her pain with anti-inflammatory, muscle relaxant and short course of narcotic pain medication until she can follow-up with [facility].  Return precautions were discussed at length, and patient is amenable with plan at this time.  She stable at this time for outpatient follow-up.       Marlana Salvage, PA 04/28/20 2030    Duffy Bruce, MD 04/30/20 [phone]

## 2020-04-28 NOTE — ED Notes (Signed)
Patient is alert and oriented x4, with limited movement due to sciatica pain, and in no acute distress. Will continue to monitor and assess.

## 2020-04-28 NOTE — ED Triage Notes (Signed)
Pt presents via POV c/o lower back pain extending into right leg. Pt reports fall this am. Xrays of back taken at [place] yesterday per pt report. Fall was today with worsening pain. Reports hx Sciatica.

## 2020-04-28 NOTE — Discharge Instructions (Signed)
Please take the anti-inflammatory, muscle relaxant and pain medication as prescribed.  Follow-up with [facility].  Return for any worsening.

## 2020-04-28 NOTE — ED Provider Notes (Signed)
[facility] Emergency Department Provider Note  ____________________________________________   Event Date/Time   First MD Initiated Contact with Patient 04/28/20 1721     (approximate)  I have reviewed the triage vital signs and the nursing notes.   HISTORY  Chief Complaint Back Pain    HPI Kayla Shah is a 65 y.o. female presents emergency department complaining of low back pain and right hip pain after a fall today.  [facility] for [facility] pain and gave her steroid injection in her buttock.  States it is painful to bear weight.  Pain is radiating to the groin area.  No loss of bowel or bladder control.  No head injury.  No LOC    Past Medical History:  Diagnosis Date  . Allergy   . Anxiety    agoraphobia   . Arthritis   . Chicken pox   . COPD (chronic obstructive pulmonary disease) ([place])   . Depression   . Family history of adverse reaction to anesthesia    PONV  . GERD (gastroesophageal reflux disease)   . Headache   . History of blood transfusion    1984  . Hyperlipidemia   . Hypertension   . Migraine   . PONV (postoperative nausea and vomiting)   . Seizure [facility])    last seizure 04/2019    Patient Active Problem List   Diagnosis Date Noted  . Chronic bilateral low back pain without sciatica 02/17/2020  . Annual physical exam 02/17/2020  . Nausea 12/08/2019  . Encounter to establish care 11/11/2019  . Elevated glucose level 11/11/2019  . Encounter for screening mammogram for malignant neoplasm of breast 11/11/2019  . Status post repair of ventral hernia 10/13/2019  . Ventral hernia without obstruction or gangrene 08/30/2019  . Underweight 06/02/2019  . Intractable nausea and vomiting 05/01/2019  . Hypokalemia 05/01/2019  . Elevated troponin 06/12/2018  . Lung nodule 10/08/2017  . Anxiety and depression 07/30/2017  . Agoraphobia 07/30/2017  . CAD (coronary artery disease) 07/30/2017  .  Hot flashes due to surgical menopause 07/30/2017  . Insomnia 07/30/2017  . Osteoporosis 02/27/2016  . Personal history of tobacco use, presenting hazards to health 10/23/2015  . Primary osteoarthritis of left elbow 10/11/2015  . Migraine 06/07/2015  . Seizures ([place]) 06/07/2015  . Smoker 06/07/2015  . Gastroesophageal reflux disease without esophagitis 06/07/2015  . COPD (chronic obstructive pulmonary disease) with chronic bronchitis ([place]) 08/14/2014  . Essential (primary) hypertension 08/14/2014  . Pure hypercholesterolemia 08/14/2014  . Abnormal brain scan 08/29/2013  . Depression 08/29/2013    Past Surgical History:  Procedure Laterality Date  . ABDOMINAL HYSTERECTOMY  1984   Total  . APPENDECTOMY  1984  . COLONOSCOPY WITH PROPOFOL N/A 03/26/2017   Procedure: COLONOSCOPY WITH PROPOFOL;  Surgeon: Jonathon Bellows, MD;  Location: [facility] ENDOSCOPY;  Service: Gastroenterology;  Laterality: N/A;  . VENTRAL HERNIA REPAIR N/A 09/16/2019   Procedure: HERNIA REPAIR VENTRAL ADULT, Open;  Surgeon: Fredirick Maudlin, MD;  Location: [facility] ORS;  Service: General;  Laterality: N/A;  . WISDOM TOOTH EXTRACTION      Prior to Admission medications   Medication Sig Start Date End Date Taking? Authorizing Provider  ARIPiprazole (ABILIFY) 2 MG tablet Take 2 mg by mouth daily. 02/09/20   [provider]  diazepam (VALIUM) 5 MG tablet Take 5 mg by mouth 3 (three) times daily as needed. 02/09/20   [provider]  estrogens, conjugated, ([name]) 0.9 MG tablet Take  daily for 21 days then do not take for 7 days. 03/01/20   Karamalegos, Devonne Doughty, DO  famotidine (PEPCID) 10 MG tablet Take 1 tablet (10 mg total) by mouth 2 (two) times daily. 04/09/20   Karamalegos, Devonne Doughty, DO  gabapentin (NEURONTIN) 300 MG capsule Take 1 capsule (300 mg total) by mouth 2 (two) times daily. 03/14/20   Karamalegos, Devonne Doughty, DO  lamoTRIgine (LAMICTAL) 100 MG tablet Take 1 tablet (100 mg total) by mouth 2 (two) times  daily. 08/15/19   Lomax, [name], NP  pantoprazole (PROTONIX) 40 MG tablet TAKE 1 TABLET BY MOUTH TWICE A DAY 02/08/20   Malfi, Lupita Raider, FNP  predniSONE (STERAPRED UNI-PAK 21 TAB) 10 MG (21) TBPK tablet 6,6,5,5,4,4,3,3,2,2,1,1 03/07/20   Vallarie Mare M, PA-C  promethazine (PHENERGAN) 12.5 MG tablet Take 1 tablet (12.5 mg total) by mouth every 8 (eight) hours as needed for nausea or vomiting. 12/08/19   Malfi, Lupita Raider, FNP  rizatriptan (MAXALT-MLT) 10 MG disintegrating tablet Take 1 tablet (10 mg total) by mouth as needed for migraine. May repeat in 2 hours if needed 05/12/19   Lomax, [name], NP  simvastatin (ZOCOR) 20 MG tablet Take 1 tablet (20 mg total) by mouth daily at 6 PM. 12/27/19   Karamalegos, Devonne Doughty, DO  verapamil (CALAN-SR) 240 MG CR tablet TAKE 1 TABLET BY MOUTH DAILY 01/23/20   Malfi, Lupita Raider, FNP    Allergies Codeine, Fluoxetine, and Paroxetine  Family History  Problem Relation Age of Onset  . Stroke Mother   . Depression Mother   . Arthritis Mother   . Heart disease Father   . Alcohol abuse Father   . Diabetes Father   . Osteoporosis Sister   . Kidney disease Sister   . Arthritis Sister   . Arthritis Maternal Grandmother   . Heart disease Maternal Grandmother   . Hyperlipidemia Maternal Grandmother   . Diabetes Paternal Grandmother   . Breast cancer Neg Hx     Social History Social History   Tobacco Use  . Smoking status: Current Every Day Smoker    Packs/day: 1.00    Years: 45.00    Pack years: 45.00    Types: Cigarettes  . Smokeless tobacco: Never Used  . Tobacco comment: reports quitting in feb 2021  Vaping Use  . Vaping Use: Never used  Substance Use Topics  . Alcohol use: No  . Drug use: Yes    Types: Marijuana    Review of Systems  Constitutional: No fever/chills Eyes: No visual changes. ENT: No sore throat. Respiratory: Denies cough Cardiovascular: Denies chest pain Gastrointestinal: Denies abdominal pain Genitourinary: Negative for  dysuria. Musculoskeletal: Positive for back pain. Skin: Negative for rash. Psychiatric: no mood changes,     ____________________________________________   PHYSICAL EXAM:  VITAL SIGNS: ED Triage Vitals  Enc Vitals Group     BP 04/28/20 1635 (!) 149/89     Pulse Rate 04/28/20 1635 81     Resp 04/28/20 1635 16     Temp 04/28/20 1635 99 F (37.2 C)     Temp Source 04/28/20 1635 Oral     SpO2 04/28/20 1635 99 %     Weight --      Height --      Head Circumference --      Peak Flow --      Pain Score 04/28/20 1636 10     Pain Loc --      Pain Edu? --  Excl. in [place]? --     Constitutional: Alert and oriented. Well appearing and in no acute distress. Eyes: Conjunctivae are normal.  Head: Atraumatic. Nose: No congestion/rhinnorhea. Mouth/Throat: Mucous membranes are moist.   Neck:  supple no lymphadenopathy noted Cardiovascular: Normal rate, regular rhythm.  Respiratory: Normal respiratory effort.  No retractions,  GU: deferred Musculoskeletal: Lumbar spine is tender to palpation, right hip is very tender to palpation, dorsiflexion of the right great toe is weaker than the left, neurovascular is intact  neurologic:  Normal speech and language.  Skin:  Skin is warm, dry and intact. No rash noted. Psychiatric: Mood and affect are normal. Speech and behavior are normal.  ____________________________________________   LABS (all labs ordered are listed, but only abnormal results are displayed)  Labs Reviewed - No data to display ____________________________________________   ____________________________________________  RADIOLOGY  X-ray of the right hip CT lumbar spine and pelvis  ____________________________________________   PROCEDURES  Procedure(s) performed: No  Procedures    ____________________________________________   INITIAL IMPRESSION / ASSESSMENT AND PLAN / ED COURSE  Pertinent labs & imaging results that were available during my care of  the patient were reviewed by me and considered in my medical decision making (see chart for details).   Patient 65 year old female presents after a fall and has back pain/pain.  See HPI.  Physical exam shows patient stable.  DDx: Right hip fracture, compression fracture, contusions   X-ray of the right hip shows questionable avascular necrosis.  Due to concerns of compression fracture and the amount of arthritis in the patient's back CT of the lumbar spine and pelvis both ordered.  Patient was given fentanyl 50 mcg IM  Care will be transferred to [facility], PA-C.  Plan at this time is to discharge if there is not a fracture.  Follow-up with orthopedics.  Jacalynn Buzzell Comfort was evaluated in Emergency Department on 04/28/2020 for the symptoms described in the history of present illness. She was evaluated in the context of the global COVID-19 pandemic, which necessitated consideration that the patient might be at risk for infection with the SARS-CoV-2 virus that causes COVID-19. Institutional protocols and algorithms that pertain to the evaluation of patients at risk for COVID-19 are in a state of rapid change based on information released by regulatory bodies including the CDC and federal and state organizations. These policies and algorithms were followed during the patient's care in the ED.    As part of my medical decision making, I reviewed the following data within the [place] History obtained from family, Nursing notes reviewed and incorporated, Old chart reviewed, Radiograph reviewed , Notes from prior ED visits and [place] Controlled Substance Database  ____________________________________________   FINAL CLINICAL IMPRESSION(S) / ED DIAGNOSES  Final diagnoses:  Fall, initial encounter      NEW MEDICATIONS STARTED DURING THIS VISIT:  New Prescriptions   No medications on file     Note:  This document was prepared using Dragon voice recognition software  and may include unintentional dictation errors.    Versie Starks, PA-C 04/28/20 1915    Vanessa Haynes, MD 04/29/20 [phone]

## 2020-05-01 ENCOUNTER — Other Ambulatory Visit: Payer: Self-pay

## 2020-05-01 DIAGNOSIS — I700 Atherosclerosis of aorta: Secondary | ICD-10-CM | POA: Insufficient documentation

## 2020-05-01 DIAGNOSIS — I10 Essential (primary) hypertension: Secondary | ICD-10-CM

## 2020-05-01 MED ORDER — VERAPAMIL HCL ER 240 MG PO TBCR: 240.0000 mg | EXTENDED_RELEASE_TABLET | Freq: Every day | ORAL | 1 refills | Status: DC

## 2020-05-03 ENCOUNTER — Other Ambulatory Visit: Payer: Self-pay

## 2020-05-03 ENCOUNTER — Encounter: Payer: Self-pay | Admitting: Family Medicine

## 2020-05-03 ENCOUNTER — Ambulatory Visit (INDEPENDENT_AMBULATORY_CARE_PROVIDER_SITE_OTHER): Payer: Medicare Other | Admitting: Family Medicine

## 2020-05-03 VITALS — BP 158/72 | HR 77 | Temp 97.1°F | Ht 65.0 in | Wt 138.2 lb

## 2020-05-03 DIAGNOSIS — M545 Low back pain, unspecified: Secondary | ICD-10-CM | POA: Diagnosis not present

## 2020-05-03 DIAGNOSIS — G8929 Other chronic pain: Secondary | ICD-10-CM

## 2020-05-03 DIAGNOSIS — M87051 Idiopathic aseptic necrosis of right femur: Secondary | ICD-10-CM | POA: Diagnosis not present

## 2020-05-03 MED ORDER — OXYCODONE-ACETAMINOPHEN 5-325 MG PO TABS
1.0000 | ORAL_TABLET | ORAL | 0 refills | Status: DC | PRN
Start: 2020-05-03 — End: 2020-05-31

## 2020-05-03 MED ORDER — METHOCARBAMOL 500 MG PO TABS: 500.0000 mg | ORAL_TABLET | Freq: Four times a day (QID) | ORAL | 2 refills | Status: DC

## 2020-05-03 NOTE — Patient Instructions (Addendum)
Thank you for coming to the office today.  Gabapentin 300mg  currently you are on one pill twice a day. Go ahead and increase to add 1 extra pill per day, so one pill three times a day, or can double up on one dose After another 1 week you can go up by another extra pill - up to 4 pills per day max, take doses every 6-8 hours per day.  Future - let us know when or how many you are taking, before it is refilled so we can order it properly.  Refilled Methocarbamol 500mg  4 times a day.  Oxycodone take as needed as last option for pain.  Recommend to start taking Tylenol Extra Strength 500mg  tabs - take 1 to 2 tabs per dose (max 1000mg ) every 6-8 hours for pain (take regularly, don't skip a dose for next 7 days), max 24 hour daily dose is 6 tablets or 3000mg . In the future you can repeat the same everyday Tylenol course for 1-2 weeks at a time.   Stop Meloxicam.  Please notify Dr [name] that you had imaging CT in Hospital and you have Right Hip Avascular Necrosis may need further surgical evaluation.  You can ask them if they can offer any Pain Management services through [facility] or if Surgery may be option next.   Please schedule a Follow-up Appointment to: Return in about 4 weeks (around 05/31/2020) for 4 weeks follow-up with new provider Rollene Fare, Chronic Pain / Back Pain /Hip .  If you have any other questions or concerns, please feel free to call the office or send a message through [place]. You may also schedule an earlier appointment if necessary.  Additionally, you may be receiving a survey about your experience at our office within a few days to 1 week by e-mail or mail. We value your feedback.  Kayla Putnam, DO [place]

## 2020-05-03 NOTE — Progress Notes (Signed)
Subjective:    Patient ID: Kayla Shah, female    DOB: Sep 08, 1955, 65 y.o.   MRN: [id number]  Kayla Shah is a 65 y.o. female presenting on 05/03/2020 for Back Pain (Pt was seen at the ER on 3/26 for her back pain. Pt was diagnosed with acute low back pain with right sided sciatica. Pt had a MRI and CT Scan done at the ER.)   HPI   Previous PCP Kayla Skeeters, FNP    ED FOLLOW-UP VISIT  Hospital/Location: [facility] Date of ED Visit: 04/28/20  Reason for Presenting to ED: Fall, Low Back Pain Primary (+Secondary) Diagnosis: Lumbar DDD Radiculopathy, R Femoral Head AVN  FOLLOW-UP  - ED provider note and record have been reviewed - Patient presents today about 5 days after recent ED visit. Brief summary of recent course, patient had symptoms of fall, back pain, hip pain, presented to ED, testing in ED with imaging X-ray of Hip showed possible avascular necrosis. CT imaging done - no fracture. Chronic changes L4-5. Concern still R femoral head AVN. She was given short term rx Oxycodone, Methocarbamol. And to f/u with Ortho  She is followed by Dr Kayla Shah [facility].  04/27/20 - [facility] Dr Kayla Shah, and received Steroid injection for whole body, not a joint injection, helped for 1 day, no longer helping. She uses a walker. Since the ED visit and imaging. She has not returned to [facility]. She has next apt in 07/2020.  She is moving by end of May may not be able to do surgery.  She was prescribed Methocarbamol 500mg  4 times a day PRN from ED. She says it helps some. Meloxicam 15mg  daily from ED 15 pills, limited relief. She is still on Gabapentin 300mg  BID. She has not tried higher dose yet.  Not doing as well following ED visit, still with back and hip pain, limited mobility  Depression screen Kayla Shah 2/9 09/27/2019 05/26/2019 09/08/2018  Decreased Interest 0 2 0  Down, Depressed, Hopeless 0 2 0  PHQ - 2 Score 0 4 0  Altered sleeping 0 0 0  Tired, decreased energy 0 0 0   Change in appetite 0 2 0  Feeling bad or failure about yourself  0 0 0  Trouble concentrating 0 0 1  Moving slowly or fidgety/restless 0 3 0  Suicidal thoughts 0 0 0  PHQ-9 Score 0 9 1  Difficult doing work/chores - Not difficult at all -    Social History   Tobacco Use  . Smoking status: Current Every Day Smoker    Packs/day: 1.00    Years: 45.00    Pack years: 45.00    Types: Cigarettes  . Smokeless tobacco: Never Used  . Tobacco comment: reports quitting in feb 2021  Vaping Use  . Vaping Use: Never used  Substance Use Topics  . Alcohol use: No  . Drug use: Yes    Types: Marijuana    Review of Systems Per HPI unless specifically indicated above     Objective:    BP (!) 158/72 (BP Location: Right Arm, Patient Position: Sitting, Cuff Size: Normal)   Pulse 77   Temp (!) 97.1 F (36.2 C) (Temporal)   Ht 5\' 5"  (1.651 m)   Wt 138 lb 3.2 oz (62.7 kg)   SpO2 96%   BMI 23.00 kg/m   Wt Readings from Last 3 Encounters:  05/03/20 138 lb 3.2 oz (62.7 kg)  03/07/20 135 lb (61.2 kg)  02/17/20 137 lb (  62.1 kg)    Physical Exam Vitals and nursing note reviewed.  Constitutional:      General: She is not in acute distress.    Appearance: She is well-developed. She is not diaphoretic.     Comments: Well-appearing, comfortable, cooperative  HENT:     Head: Normocephalic and atraumatic.  Eyes:     General:        Right eye: No discharge.        Left eye: No discharge.     Conjunctiva/sclera: Conjunctivae normal.  Cardiovascular:     Rate and Rhythm: Normal rate.  Pulmonary:     Effort: Pulmonary effort is normal.  Musculoskeletal:     Comments: Using walker Limited hip range of motion, R hip pain.  Skin:    General: Skin is warm and dry.     Findings: No erythema or rash.  Neurological:     Mental Status: She is alert and oriented to person, place, and time.  Psychiatric:        Behavior: Behavior normal.     Comments: Well groomed, good eye contact, normal  speech and thoughts      I have personally reviewed the radiology report from 04/28/20 CT imaging X-ray.  DG Hip Unilat W or Wo Pelvis 2-3 Views RightPerformed 04/28/2020 Final result  Study Result CLINICAL DATA: [place], lower back pain, right lower extremity radiculopathy  EXAM: DG HIP (WITH OR WITHOUT PELVIS) 2-3V RIGHT  COMPARISON: None.  FINDINGS: Frontal view of the pelvis as well as frontal and frogleg lateral views of the right hip are obtained. No acute fracture, subluxation, or dislocation. There is significant joint space narrowing within the right hip, with asymmetric sclerosis of the right femoral head. This may reflect avascular necrosis of femoral head and secondary osteoarthritis. Sacroiliac joints are normal. Soft tissues are unremarkable.  IMPRESSION: 1. No acute displaced fracture. 2. Asymmetric sclerosis along the right femoral head, with associated joint space narrowing. Favor chronic femoral head avascular necrosis and secondary osteoarthritis.   Electronically Signed By: Randa Ngo M.D. On: 04/28/2020 18:22  --------------------------------------  CT PELVIS WO CONTRASTPerformed 04/28/2020 Final result  Study Result CLINICAL DATA: Pelvis pain, stress fracture suspected, neg xray  Post fall today.  EXAM: CT PELVIS WITHOUT CONTRAST  TECHNIQUE: Multidetector CT imaging of the pelvis was performed following the standard protocol without intravenous contrast.  COMPARISON: Radiograph earlier today.  FINDINGS: Urinary Tract: Small left posterior bladder diverticulum, no bladder wall thickening.  Bowel: No acute findings. Normal appendix. Moderate stool in the included colon.  Vascular/Lymphatic: Aorto bi-iliac atherosclerosis. No adenopathy.  Reproductive: Hysterectomy. No adnexal mass.  Other: No pelvic free fluid. No confluent body wall contusion.  Musculoskeletal: Right hip joint space narrowing. Mild flattening of the right femoral  head with subchondral sclerosis, suggestive of avascular necrosis. No evidence of acute fracture. Remainder of the bony pelvis is intact. Pubic rami are intact. Pubic symphyseal degenerative change of mild chondrocalcinosis. No fracture of the included sacrum.  IMPRESSION: 1. No acute fracture of the pelvis or hips. 2. Mild flattening of the right femoral head with subchondral sclerosis, suggestive of avascular necrosis. 3. Right hip osteoarthritis.  Aortic Atherosclerosis (ICD10-I70.0).   Electronically Signed By: Keith Rake M.D. On: 04/28/2020 19:53  ----------------------------------------------------------  CT Lumbar Spine Wo ContrastPerformed 04/28/2020 Final result  Study Result CLINICAL DATA: Low back pain. Fall.  EXAM: CT LUMBAR SPINE WITHOUT CONTRAST  TECHNIQUE: Multidetector CT imaging of the lumbar spine was performed without intravenous contrast administration. Multiplanar CT  image reconstructions were also generated.  COMPARISON: Lumbar spine MRI 03/07/2020  FINDINGS: Segmentation: Normal  Alignment: Grade 1 anterolisthesis at L4-5.  Vertebrae: Mild chronic height loss at T12 and L2, unchanged. No acute fracture.  Paraspinal and other soft tissues: Calcific aortic atherosclerosis.  Disc levels: L3-4: There is mild spinal canal narrowing due to disc bulge and ligamentum flavum infolding.  L4-5: Grade 1 anterolisthesis due to severe facet arthrosis. Right asymmetric disc bulge with moderate spinal canal stenosis and mild right foraminal stenosis. Extraforaminal component of the disc is in close proximity to the exiting right L4 nerve root (series 9, image 81).  L5-S1: Moderate facet hypertrophy with right asymmetric disc bulge and bilateral endplate spurring. Mild bilateral foraminal stenosis no spinal canal stenosis.  IMPRESSION: 1. No acute fracture of the lumbar spine. 2. Grade 1 anterolisthesis at L4-5 due to severe facet  arthrosis, which may be a source of local low back pain. 3. Moderate spinal canal stenosis and mild right foraminal stenosis at L4-5. Extraforaminal disc component is in close proximity to the exiting right L4 nerve root.  Aortic Atherosclerosis (ICD10-I70.0).   Electronically Signed By: Ulyses Jarred M.D. On: 04/28/2020 19:51         Results for orders placed or performed in visit on 11/11/19  POCT Urinalysis Dipstick  Result Value Ref Range   Color, UA yellow    Clarity, UA clear    Glucose, UA Negative Negative   Bilirubin, UA neg    Ketones, UA neg    Spec Grav, UA 1.010 1.010 - 1.025   Blood, UA neg    pH, UA 7.5 5.0 - 8.0   Protein, UA Negative Negative   Urobilinogen, UA 0.2 0.2 or 1.0 E.U./dL   Nitrite, UA neg    Leukocytes, UA Negative Negative   Appearance     Odor    POCT glycosylated hemoglobin (Hb A1C)  Result Value Ref Range   Hemoglobin A1C 5.1 4.0 - 5.6 %   HbA1c POC (<> result, manual entry)     HbA1c, POC (prediabetic range)     HbA1c, POC (controlled diabetic range)        Assessment & Plan:   Problem List Items Addressed This Visit    Chronic bilateral low back pain without sciatica   Relevant Medications   gabapentin (NEURONTIN) 300 MG capsule   methocarbamol (ROBAXIN) 500 MG tablet   oxyCODONE-acetaminophen (PERCOCET/ROXICET) 5-325 MG tablet    Other Visit Diagnoses    Avascular necrosis of right femoral head (HCC)    -  Primary   Relevant Medications   oxyCODONE-acetaminophen (PERCOCET/ROXICET) 5-325 MG tablet      Chronic Osteoarthritis / DDD Lumbar spine with Radiculopathy Right Hip Pain, chronic - new diagnosis Avascular necrosis of femoral head  Followed currently by [facility] - Dr Kayla Shah  Recent ED visit with fall, now has HFU here today  Limited improvement with pain. Some relief on methocarbamol and gabapentin. Improved on oxycodone, PRN.  She is not with pain specialist  Discussion today based on reviewed  CT imaging from ED, I am concerned with her R hip femoral head AVN as significant etiology of her mobility and pain. Persistent lumbar DDD findings with moderate stenosis contributing to symptoms.  I advised that she needs to return to [facility] promptly for further re-evaluation and discuss possible surgical intervention if indicated. They can also arrange Pain Management for her if needed.  I will fax over copy of the CT Imaging results from  ED to Dr Liliana Cline office, and patient given copy as well to bring. She can f/u with him soon or as scheduled in 2-3 months.  I will offer reorder Oxycodone 5/325 take one q 4 hr PRN #30 pill short term supply only. She will use sparingly only to help bridge her back to Orthopedic and they can take over this or other rx, and she may need surgery in near future.  Refilled Methocarbamol  Increase dose Gabapentin from 300mg  BID to TID vs eventually QID if indicated.    Meds ordered this encounter  Medications  . methocarbamol (ROBAXIN) 500 MG tablet    Sig: Take 1 tablet (500 mg total) by mouth 4 (four) times daily.    Dispense:  120 tablet    Refill:  2  . oxyCODONE-acetaminophen (PERCOCET/ROXICET) 5-325 MG tablet    Sig: Take 1 tablet by mouth every 4 (four) hours as needed for severe pain.    Dispense:  30 tablet    Refill:  0      Follow up plan: Return in about 4 weeks (around 05/31/2020) for 4 weeks follow-up with new provider [place], Chronic Pain / Back Pain /Hip .   Nobie Putnam, [facility] 05/03/2020, 10:12 AM

## 2020-05-09 ENCOUNTER — Telehealth: Payer: Self-pay

## 2020-05-09 DIAGNOSIS — E8941 Symptomatic postprocedural ovarian failure: Secondary | ICD-10-CM

## 2020-05-09 NOTE — Telephone Encounter (Signed)
Patient states that she need [name] needs to be increased because she is still having increased hot flashes. Patient also states that she needs to have her Oxycodone refill before she is seen by her new provider on 05/31/2020. Please advise

## 2020-05-10 ENCOUNTER — Other Ambulatory Visit: Payer: Self-pay

## 2020-05-10 MED ORDER — ESTROGENS CONJUGATED 1.25 MG PO TABS: 1.2500 mg | ORAL_TABLET | Freq: Every day | ORAL | 0 refills | Status: DC

## 2020-05-10 NOTE — Telephone Encounter (Signed)
The pt declined the pain management at this time because she smokes Marijuana . She said she guess she will just have to deal with the hip pain until she is able to get surgery.

## 2020-05-10 NOTE — Addendum Note (Signed)
Addended by: Olin Hauser on: 05/10/2020 02:42 PM   Modules accepted: Orders

## 2020-05-10 NOTE — Telephone Encounter (Signed)
I saw this patient in coverage awaiting new provider for Kayla Skeeters, FNP.  She may need to see a GYN provider for further managing hormone therapy for postmenopausal symptoms / hot flashes.  I sent a new order [name] 1.25mg  once daily for 30 days.  That will last her until she sees new provider on 05/31/20.  She may need referral or other adjusted dose at that time.  Regarding the Pain Medication, unfortunately there is not much else I can do.  We discussed at her appointment on 05/03/20 that I would be willing to re order a short term supply of Oxycodone due to her Hip Injury and chronic pain.  The rx was for 30 pills filled on 05/03/20.  She needs to return to specialist to be managed by Orthopedics now, and ultimately if not doing a surgery or other therapy, then she may need a referral to a Pain Clinic.  Nobie Putnam, [facility] 05/10/2020, 2:41 PM

## 2020-05-31 ENCOUNTER — Ambulatory Visit (INDEPENDENT_AMBULATORY_CARE_PROVIDER_SITE_OTHER): Payer: Medicare Other | Admitting: Internal Medicine

## 2020-05-31 ENCOUNTER — Encounter: Payer: Self-pay | Admitting: Internal Medicine

## 2020-05-31 ENCOUNTER — Other Ambulatory Visit: Payer: Self-pay

## 2020-05-31 DIAGNOSIS — F332 Major depressive disorder, recurrent severe without psychotic features: Secondary | ICD-10-CM

## 2020-05-31 DIAGNOSIS — J449 Chronic obstructive pulmonary disease, unspecified: Secondary | ICD-10-CM | POA: Diagnosis not present

## 2020-05-31 DIAGNOSIS — G43009 Migraine without aura, not intractable, without status migrainosus: Secondary | ICD-10-CM

## 2020-05-31 DIAGNOSIS — I10 Essential (primary) hypertension: Secondary | ICD-10-CM

## 2020-05-31 DIAGNOSIS — E8941 Symptomatic postprocedural ovarian failure: Secondary | ICD-10-CM

## 2020-05-31 DIAGNOSIS — I700 Atherosclerosis of aorta: Secondary | ICD-10-CM | POA: Diagnosis not present

## 2020-05-31 DIAGNOSIS — M810 Age-related osteoporosis without current pathological fracture: Secondary | ICD-10-CM

## 2020-05-31 DIAGNOSIS — M545 Low back pain, unspecified: Secondary | ICD-10-CM

## 2020-05-31 DIAGNOSIS — I2510 Atherosclerotic heart disease of native coronary artery without angina pectoris: Secondary | ICD-10-CM

## 2020-05-31 DIAGNOSIS — F5104 Psychophysiologic insomnia: Secondary | ICD-10-CM

## 2020-05-31 DIAGNOSIS — K219 Gastro-esophageal reflux disease without esophagitis: Secondary | ICD-10-CM

## 2020-05-31 DIAGNOSIS — R569 Unspecified convulsions: Secondary | ICD-10-CM

## 2020-05-31 DIAGNOSIS — F419 Anxiety disorder, unspecified: Secondary | ICD-10-CM

## 2020-05-31 DIAGNOSIS — R112 Nausea with vomiting, unspecified: Secondary | ICD-10-CM

## 2020-05-31 DIAGNOSIS — J4489 Other specified chronic obstructive pulmonary disease: Secondary | ICD-10-CM

## 2020-05-31 DIAGNOSIS — F32A Depression, unspecified: Secondary | ICD-10-CM

## 2020-05-31 DIAGNOSIS — E7800 Pure hypercholesterolemia, unspecified: Secondary | ICD-10-CM

## 2020-05-31 DIAGNOSIS — G8929 Other chronic pain: Secondary | ICD-10-CM

## 2020-05-31 MED ORDER — ACETAMINOPHEN-CODEINE #3 300-30 MG PO TABS: 1.0000 | ORAL_TABLET | Freq: Every day | ORAL | 0 refills | Status: DC | PRN

## 2020-05-31 NOTE — Assessment & Plan Note (Signed)
Reasonable control on Verapamil Reinforced DASH diet Will monitor

## 2020-05-31 NOTE — Assessment & Plan Note (Signed)
Persistent Encouraged daily stretching Rx for Tylenol 3, #30, 0 refills CSA today She declines Rx for muscle relaxer at this time Continue Gabapentin as needed She is aware that she needs surgery and will follow up with neurosurgery/orthopedics

## 2020-05-31 NOTE — Assessment & Plan Note (Signed)
Continue Verapamil and Maxalt She will continue to follow with neurology, will follow

## 2020-05-31 NOTE — Progress Notes (Signed)
Subjective:    Patient ID: Kayla Shah, female    DOB: [birth year], 65 y.o.   MRN: [id number]  HPI  Pt presents to the clinic today for follow up of chronic conditions. She is establishing care with me today, transferring care from [facility], NP-[facility].  HTN: Her BP today is 143/81. She is taking Verapamil as prescribed. ECG from 04/2019 reviewed.  HLD with CAD/Aortic Atherosclerosis: Her last LDL was 77,03/2019. She denies chest pain. She denies myalgias on Simvastatin. She tries to consume a low fat diet.  Migraines: These occur every few weeks. Managed on Verapamil and Rizatriptan. She is following with neurology.  COPD: Current smoker. She denies chronic cough or SOB. She is not currently using any inhalers. There are no PFT's on file.  GERD with Intractable Nausea and Vomiting: She denies breakthrough on Pantoprazole and Famotidine. There is no upper GI on file.  Osteoporosis: Bone density from 02/2016 reviewed. She is not currently taking any Vit D or Calcium OTC. She does not get weight bearing exercise daily.  Anxiety, Depression and Insomnia: Chronic. She is taking Lamotrigine and Diazepam as prescribed. She does not see a therapist but is following with psychiatry. She denies SI/HI.  Menopausal Hot Flashes: Manged on [name] which was recently increased. She reports this has helped significantly.  Seizures: None on Lamotrigine. She follows with neurology.  OA/Chronic Low Back Pain with Bilateral Sciatica/Chronic Right Hip Pain: She has an appt with a neurosurgeon in June. She did recently go for a few PT visits but reports it was not helpful.  She is taking Tylenol and Gabapentin as needed but reports it is not helpful.  She smokes marijuana daily.  Review of Systems    Past Medical History:  Diagnosis Date  . Allergy   . Anxiety    agoraphobia   . Arthritis   . Chicken pox   . COPD (chronic obstructive pulmonary disease) ([place])   . Depression   . Family history  of adverse reaction to anesthesia    PONV  . GERD (gastroesophageal reflux disease)   . Headache   . History of blood transfusion    1984  . Hyperlipidemia   . Hypertension   . Migraine   . PONV (postoperative nausea and vomiting)   . Seizure [facility])    last seizure 04/2019    Current Outpatient Medications  Medication Sig Dispense Refill  . ARIPiprazole (ABILIFY) 2 MG tablet Take 2 mg by mouth daily. (Patient not taking: Reported on 05/03/2020)    . diazepam (VALIUM) 5 MG tablet Take 5 mg by mouth 3 (three) times daily as needed.    Marland Kitchen estrogens, conjugated, ([name]) 1.25 MG tablet Take 1 tablet (1.25 mg total) by mouth daily. 30 tablet 0  . famotidine (PEPCID) 10 MG tablet Take 1 tablet (10 mg total) by mouth 2 (two) times daily. 60 tablet 1  . gabapentin (NEURONTIN) 300 MG capsule Take 1 capsule (300 mg total) by mouth 3 (three) times daily. 270 capsule 1  . lamoTRIgine (LAMICTAL) 100 MG tablet Take 1 tablet (100 mg total) by mouth 2 (two) times daily. 180 tablet 3  . methocarbamol (ROBAXIN) 500 MG tablet Take 1 tablet (500 mg total) by mouth 4 (four) times daily. 120 tablet 2  . oxyCODONE-acetaminophen (PERCOCET/ROXICET) 5-325 MG tablet Take 1 tablet by mouth every 4 (four) hours as needed for severe pain. 30 tablet 0  . pantoprazole (PROTONIX) 40 MG tablet TAKE 1 TABLET BY MOUTH TWICE A  DAY 60 tablet 2  . rizatriptan (MAXALT-MLT) 10 MG disintegrating tablet Take 1 tablet (10 mg total) by mouth as needed for migraine. May repeat in 2 hours if needed 9 tablet 11  . simvastatin (ZOCOR) 20 MG tablet Take 1 tablet (20 mg total) by mouth daily at 6 PM. 90 tablet 1  . verapamil (CALAN-SR) 240 MG CR tablet Take 1 tablet (240 mg total) by mouth daily. 90 tablet 1   No current facility-administered medications for this visit.    Allergies  Allergen Reactions  . Codeine Nausea And Vomiting  . Fluoxetine Anxiety  . Paroxetine Anxiety    Becomes very aggressive with mood swings.     Family History  Problem Relation Age of Onset  . Stroke Mother   . Depression Mother   . Arthritis Mother   . Heart disease Father   . Alcohol abuse Father   . Diabetes Father   . Osteoporosis Sister   . Kidney disease Sister   . Arthritis Sister   . Arthritis Maternal Grandmother   . Heart disease Maternal Grandmother   . Hyperlipidemia Maternal Grandmother   . Diabetes Paternal Grandmother   . Breast cancer Neg Hx     Social History   Socioeconomic History  . Marital status: Divorced    Spouse name: Legrand Como  . Number of children: 0  . Years of education: 56  . Highest education level: Not on file  Occupational History    Comment: disabled  Tobacco Use  . Smoking status: Current Every Day Smoker    Packs/day: 1.00    Years: 45.00    Pack years: 45.00    Types: Cigarettes  . Smokeless tobacco: Never Used  . Tobacco comment: reports quitting in feb 2021  Vaping Use  . Vaping Use: Never used  Substance and Sexual Activity  . Alcohol use: No  . Drug use: Yes    Types: Marijuana  . Sexual activity: Never  Other Topics Concern  . Not on file  Social History Narrative   Disabled    Married no kids    No guns, wears seat belt, feels safe in relationship    12th grade ed.    Social Determinants of Health   Financial Resource Strain: Not on file  Food Insecurity: Not on file  Transportation Needs: Not on file  Physical Activity: Not on file  Stress: Not on file  Social Connections: Not on file  Intimate Partner Violence: Not on file     Constitutional: Pt reports headaches. Denies fever, malaise, fatigue, or abrupt weight changes.  HEENT: Denies eye pain, eye redness, ear pain, ringing in the ears, wax buildup, runny nose, nasal congestion, bloody nose, or sore throat. Respiratory: Denies difficulty breathing, shortness of breath, cough or sputum production.   Cardiovascular: Denies chest pain, chest tightness, palpitations or swelling in the hands or  feet.  Gastrointestinal: Pt reports intermittent nausea and vomiting. Denies abdominal pain, bloating, constipation, diarrhea or blood in the stool.  GU: Denies urgency, frequency, pain with urination, burning sensation, blood in urine, odor or discharge. Musculoskeletal: Pt reports chronic back and hip pain, decreased range of motion and difficulty with gait. Denies joint swelling.  Skin: Denies redness, rashes, lesions or ulcercations.  Neurological: Pt reports hot flashes, insomnia, numbness, tingling and weakness of her right lower extremity. Denies dizziness, difficulty with memory, difficulty with speech.  Psych:Pt has a history of anxiety and depression. Denies SI/HI.  No other specific complaints in a complete  review of systems (except as listed in HPI above).     Objective:   Physical Exam   BP (!) 143/81 (BP Location: Right Arm, Patient Position: Sitting, Cuff Size: Normal)   Pulse (!) 101   Temp (!) 97.5 F (36.4 C) (Temporal)   Resp 18   Ht 5\' 5"  (1.651 m)   Wt 131 lb 12.8 oz (59.8 kg)   SpO2 98%   BMI 21.93 kg/m  All Wt Readings from Last 3 Encounters:  05/03/20 138 lb 3.2 oz (62.7 kg)  03/07/20 135 lb (61.2 kg)  02/17/20 137 lb (62.1 kg)    General: Appears theherated age, appears in pain but in NAD. Skin: Warm, dry and intact. No rashesnoted. HEENT: Head: normal shape and size; Eyes: sclera white and EOMs intact;  Cardiovascular: Tachycardic with rhythm. S1,S2 noted.  No murmur, rubs or gallops noted.  Pulmonary/Chest: Normal effort and positive vesicular breath sounds. No respiratory distress. No wheezes, rales or ronchi noted.  Abdomen: Normal bowel sounds.  Musculoskeletal: Normal flexion of the spine.  Decreased extension, rotation and lateral bending.  Bony tenderness noted over the lumbar spine.  Normal abduction and external rotation of the right hip.  Pain with abduction and internal rotation of the right hip.  Pain with palpation of the right trochanter.   Strength 4/5 RLE, 5/5 LLE.  Limping, using walker for assistance.  Neurological: Alert and oriented.  Positive SLR on the right. Psychiatric: Mood and affect flat. Behavior is normal. Judgment and thought content normal.    BMET    Component Value Date/Time   NA 141 06/28/2019 1312   NA 139 08/28/2013 1805   K 3.8 06/28/2019 1312   K 3.5 08/28/2013 1805   CL 103 06/28/2019 1312   CL 101 08/28/2013 1805   CO2 19 (L) 06/28/2019 1312   CO2 34 (H) 08/28/2013 1805   GLUCOSE 125 (H) 06/28/2019 1312   GLUCOSE 82 05/03/2019 0630   GLUCOSE 91 08/28/2013 1805   BUN 15 06/28/2019 1312   BUN 13 08/28/2013 1805   CREATININE 0.86 06/28/2019 1312   CREATININE 1.27 08/28/2013 1805   CALCIUM 9.2 06/28/2019 1312   CALCIUM 8.8 08/28/2013 1805   GFRNONAA 72 06/28/2019 1312   GFRNONAA 47 (L) 08/28/2013 1805   GFRAA 83 06/28/2019 1312   GFRAA 54 (L) 08/28/2013 1805    Lipid Panel     Component Value Date/Time   CHOL 161 03/18/2019 1132   TRIG 83 03/18/2019 1132   HDL 68 03/18/2019 1132   CHOLHDL 2.1 06/12/2018 1631   VLDL 9 06/12/2018 1631   LDLCALC 77 03/18/2019 1132    CBC    Component Value Date/Time   WBC 6.3 06/28/2019 1312   WBC 5.2 05/03/2019 0630   RBC 3.17 (L) 06/28/2019 1312   RBC 2.53 (L) 05/03/2019 0630   HGB 11.1 06/28/2019 1312   HCT 33.6 (L) 06/28/2019 1312   PLT 238 06/28/2019 1312   MCV 106 (H) 06/28/2019 1312   MCV 99 08/28/2013 1805   MCH 35.0 (H) 06/28/2019 1312   MCH 37.2 (H) 05/03/2019 0630   MCHC 33.0 06/28/2019 1312   MCHC 34.9 05/03/2019 0630   RDW 11.8 06/28/2019 1312   RDW 13.0 08/28/2013 1805   LYMPHSABS 2.1 06/28/2019 1312   LYMPHSABS 1.2 02/04/2012 0756   MONOABS 1.2 (H) 05/01/2019 0948   MONOABS 0.3 02/04/2012 0756   EOSABS 0.1 06/28/2019 1312   EOSABS 0.1 02/04/2012 0756   BASOSABS 0.0 06/28/2019 1312  BASOSABS 0.1 02/04/2012 0756    Hgb A1C Lab Results  Component Value Date   HGBA1C 5.1 11/11/2019          Assessment & Plan:    Webb Silversmith, NP This visit occurred during the SARS-CoV-2 public health emergency.  Safety protocols were in place, including screening questions prior to the visit, additional usage of staff PPE, and extensive cleaning of exam room while observing appropriate contact time as indicated for disinfecting solutions.

## 2020-05-31 NOTE — Assessment & Plan Note (Signed)
None on Lamotrigine She will continue to follow with neurology, will follow

## 2020-05-31 NOTE — Assessment & Plan Note (Signed)
Managed on Lamotrigine and Diazepam She will continue to follow with psychiatry, will follow Will monitor

## 2020-05-31 NOTE — Assessment & Plan Note (Signed)
Lipid profile reviewed Encouraged her to consume a low-fat diet Continue Simvastatin

## 2020-05-31 NOTE — Assessment & Plan Note (Signed)
Encourage calcium and vitamin D OTC She is unable to get daily weightbearing exercise right now due to chronic sciatica and AVN of the right hip

## 2020-05-31 NOTE — Assessment & Plan Note (Signed)
Encourage smoking cessation

## 2020-05-31 NOTE — Progress Notes (Signed)
   Subjective:    Patient ID: Kayla Shah, female    DOB: 10/06/1955, 65 y.o.   MRN: [id number]  HPI    Review of Systems     Objective:   Physical Exam        Assessment & Plan:

## 2020-05-31 NOTE — Assessment & Plan Note (Deleted)
Managed on Lamotrigine and Diazepam She will continue to follow with psychiatry, will follow 

## 2020-05-31 NOTE — Assessment & Plan Note (Signed)
Managed on Lamotrigine and Diazepam She will continue to follow with psychiatry, will follow

## 2020-05-31 NOTE — Assessment & Plan Note (Signed)
Managed on Pantoprazole and Famotidine Will monitor

## 2020-05-31 NOTE — Assessment & Plan Note (Signed)
Managed on Pantoprazole and Famotidine Will monitor 

## 2020-05-31 NOTE — Assessment & Plan Note (Signed)
No angina She will continue Verapamil, Simvastatin Advised her to start baby Aspirin daily

## 2020-05-31 NOTE — Assessment & Plan Note (Signed)
No angina She will continue Verapamil, Simvastatin Advised her to start baby Aspirin daily 

## 2020-05-31 NOTE — Patient Instructions (Signed)
Sciatica Rehab Ask your health care provider which exercises are safe for you. Do exercises exactly as told by your health care provider and adjust them as directed. It is normal to feel mild stretching, pulling, tightness, or discomfort as you do these exercises. Stop right away if you feel sudden pain or your pain gets worse. Do not begin these exercises until told by your health care provider. Stretching and range-of-motion exercises These exercises warm up your muscles and joints and improve the movement and flexibility of your hips and back. These exercises also help to relieve pain, numbness, and tingling. Sciatic nerve glide 1. Sit in a chair with your head facing down toward your chest. Place your hands behind your back. Let your shoulders slump forward. 2. Slowly straighten one of your legs while you tilt your head back as if you are looking toward the ceiling. Only straighten your leg as far as you can without making your symptoms worse. 3. Hold this position for __________ seconds. 4. Slowly return to the starting position. 5. Repeat with your other leg. Repeat __________ times. Complete this exercise __________ times a day. Knee to chest with hip adduction and internal rotation 1. Lie on your back on a firm surface with both legs straight. 2. Bend one of your knees and move it up toward your chest until you feel a gentle stretch in your lower back and buttock. Then, move your knee toward the shoulder that is on the opposite side from your leg. This is hip adduction and internal rotation. ? Hold your leg in this position by holding on to the front of your knee. 3. Hold this position for __________ seconds. 4. Slowly return to the starting position. 5. Repeat with your other leg. Repeat __________ times. Complete this exercise __________ times a day.   Prone extension on elbows 1. Lie on your abdomen on a firm surface. A bed may be too soft for this exercise. 2. Prop yourself up on  your elbows. 3. Use your arms to help lift your chest up until you feel a gentle stretch in your abdomen and your lower back. ? This will place some of your body weight on your elbows. If this is uncomfortable, try stacking pillows under your chest. ? Your hips should stay down, against the surface that you are lying on. Keep your hip and back muscles relaxed. 4. Hold this position for __________ seconds. 5. Slowly relax your upper body and return to the starting position. Repeat __________ times. Complete this exercise __________ times a day.   Strengthening exercises These exercises build strength and endurance in your back. Endurance is the ability to use your muscles for a long time, even after they get tired. Pelvic tilt This exercise strengthens the muscles that lie deep in the abdomen. 1. Lie on your back on a firm surface. Bend your knees and keep your feet flat on the floor. 2. Tense your abdominal muscles. Tip your pelvis up toward the ceiling and flatten your lower back into the floor. ? To help with this exercise, you may place a small towel under your lower back and try to push your back into the towel. 3. Hold this position for __________ seconds. 4. Let your muscles relax completely before you repeat this exercise. Repeat __________ times. Complete this exercise __________ times a day. Alternating arm and leg raises 1. Get on your hands and knees on a firm surface. If you are on a hard floor, you may want to  use padding, such as an exercise mat, to cushion your knees. 2. Line up your arms and legs. Your hands should be directly below your shoulders, and your knees should be directly below your hips. 3. Lift your left leg behind you. At the same time, raise your right arm and straighten it in front of you. ? Do not lift your leg higher than your hip. ? Do not lift your arm higher than your shoulder. ? Keep your abdominal and back muscles tight. ? Keep your hips facing the  ground. ? Do not arch your back. ? Keep your balance carefully, and do not hold your breath. 4. Hold this position for __________ seconds. 5. Slowly return to the starting position. 6. Repeat with your right leg and your left arm. Repeat __________ times. Complete this exercise __________ times a day.   Posture and body mechanics Good posture and healthy body mechanics can help to relieve stress in your body's tissues and joints. Body mechanics refers to the movements and positions of your body while you do your daily activities. Posture is part of body mechanics. Good posture means:  Your spine is in its natural S-curve position (neutral).  Your shoulders are pulled back slightly.  Your head is not tipped forward. Follow these guidelines to improve your posture and body mechanics in your everyday activities. Standing  When standing, keep your spine neutral and your feet about hip width apart. Keep a slight bend in your knees. Your ears, shoulders, and hips should line up.  When you do a task in which you stand in one place for a long time, place one foot up on a stable object that is 2-4 inches (5-10 cm) high, such as a footstool. This helps keep your spine neutral.   Sitting  When sitting, keep your spine neutral and keep your feet flat on the floor. Use a footrest, if necessary, and keep your thighs parallel to the floor. Avoid rounding your shoulders, and avoid tilting your head forward.  When working at a desk or a computer, keep your desk at a height where your hands are slightly lower than your elbows. Slide your chair under your desk so you are close enough to maintain good posture.  When working at a computer, place your monitor at a height where you are looking straight ahead and you do not have to tilt your head forward or downward to look at the screen.   Resting  When lying down and resting, avoid positions that are most painful for you.  If you have pain with activities  such as sitting, bending, stooping, or squatting, lie in a position in which your body does not bend very much. For example, avoid curling up on your side with your arms and knees near your chest (fetal position).  If you have pain with activities such as standing for a long time or reaching with your arms, lie with your spine in a neutral position and bend your knees slightly. Try the following positions: ? Lying on your side with a pillow between your knees. ? Lying on your back with a pillow under your knees. Lifting  When lifting objects, keep your feet at least shoulder width apart and tighten your abdominal muscles.  Bend your knees and hips and keep your spine neutral. It is important to lift using the strength of your legs, not your back. Do not lock your knees straight out.  Always ask for help to lift heavy or awkward objects.  This information is not intended to replace advice given to you by your health care provider. Make sure you discuss any questions you have with your health care provider. Document Revised: 05/14/2018 Document Reviewed: 02/11/2018 Elsevier Patient Education  2021 [employer]vier Inc.  

## 2020-05-31 NOTE — Assessment & Plan Note (Signed)
Continue [name] Discussed increased clotting risk given the fact that she smokes

## 2020-06-02 ENCOUNTER — Other Ambulatory Visit: Payer: Self-pay | Admitting: Family Medicine

## 2020-06-02 DIAGNOSIS — E8941 Symptomatic postprocedural ovarian failure: Secondary | ICD-10-CM

## 2020-06-02 NOTE — Telephone Encounter (Signed)
Requested Prescriptions  Pending Prescriptions Disp Refills  . [name] 1.25 MG tablet [Pharmacy Med Name: [name] 1.25 MG TAB] 90 tablet 3    Sig: TAKE 1 TABLET BY MOUTH DAILY     OB/GYN:  Estrogens Failed - 06/02/2020 11:09 AM      Failed - Last BP in normal range    BP Readings from Last 1 Encounters:  05/31/20 (!) 143/81         Passed - Mammogram is up-to-date per Health Maintenance      Passed - Valid encounter within last 12 months    Recent Outpatient Visits          2 days ago COPD (chronic obstructive pulmonary disease) with chronic bronchitis ([place])   [facility] [place], Coralie Keens, NP   1 month ago Avascular necrosis of right femoral head [facility])   [place], DO   3 months ago Annual physical exam   [facility], Lupita Raider, FNP   5 months ago Gastroesophageal reflux disease without esophagitis   [place], FNP   6 months ago Encounter to establish care   [facility], Lupita Raider, 

## 2020-06-15 ENCOUNTER — Other Ambulatory Visit: Payer: Self-pay | Admitting: Family Medicine

## 2020-06-15 ENCOUNTER — Telehealth: Payer: Self-pay | Admitting: Internal Medicine

## 2020-06-15 DIAGNOSIS — K219 Gastro-esophageal reflux disease without esophagitis: Secondary | ICD-10-CM

## 2020-06-15 NOTE — Telephone Encounter (Signed)
Pt called to ask if [facility] or nurse can call her and go over what she was advised and what was discussed during her last visit/ please advise

## 2020-06-19 NOTE — Telephone Encounter (Signed)
I called the patient back concerning the message she left 4 days ago. She said that it's okay now. She doesn't have any questions or concerns.

## 2020-07-14 ENCOUNTER — Other Ambulatory Visit: Payer: Self-pay | Admitting: Family Medicine

## 2020-07-14 DIAGNOSIS — K219 Gastro-esophageal reflux disease without esophagitis: Secondary | ICD-10-CM

## 2020-07-14 DIAGNOSIS — E7800 Pure hypercholesterolemia, unspecified: Secondary | ICD-10-CM

## 2020-07-14 NOTE — Telephone Encounter (Signed)
Requested Prescriptions  Pending Prescriptions Disp Refills  . famotidine (PEPCID) 20 MG tablet [Pharmacy Med Name: FAMOTIDINE 20 MG TAB] 30 tablet 0    Sig: TAKE 1/2 TABLET (10 MG TOTAL) BY MOUTH 2TIMES DAILY     Gastroenterology:  H2 Antagonists Passed - 07/14/2020 10:08 AM      Passed - Valid encounter within last 12 months    Recent Outpatient Visits          1 month ago COPD (chronic obstructive pulmonary disease) with chronic bronchitis ([place])   [facility] [place], Coralie Keens, NP   2 months ago Avascular necrosis of right femoral head [facility])   [place], DO   4 months ago Annual physical exam   [facility], Lupita Raider, FNP   7 months ago Gastroesophageal reflux disease without esophagitis   [place], FNP   8 months ago Encounter to establish care   [facility], Lupita Raider, FNP      Future Appointments            In 1 week [place], Barbaraann Faster, NP [employer], [facility]

## 2020-07-14 NOTE — Telephone Encounter (Signed)
Requested medication (s) are due for refill today: yes  Requested medication (s) are on the active medication list: yes  Last refill:  12/27/19   Future visit scheduled: yes  Notes to clinic:  overdue lab work   Requested Prescriptions  Pending Prescriptions Disp Refills   simvastatin ([place]) 20 MG tablet [Pharmacy Med Name: SIMVASTATIN 20 MG TAB] 90 tablet     Sig: TAKE 1 TABLET BY MOUTH DAILY AT 6PM      Cardiovascular:  Antilipid - Statins Failed - 07/14/2020  9:23 AM      Failed - Total Cholesterol in normal range and within 360 days    Cholesterol, Total  Date Value Ref Range Status  03/18/2019 161 100 - 199 mg/dL Final          Failed - LDL in normal range and within 360 days    LDL Chol Calc (NIH)  Date Value Ref Range Status  03/18/2019 77 0 - 99 mg/dL Final          Failed - HDL in normal range and within 360 days    HDL  Date Value Ref Range Status  03/18/2019 68 >39 mg/dL Final          Failed - Triglycerides in normal range and within 360 days    Triglycerides  Date Value Ref Range Status  03/18/2019 83 0 - 149 mg/dL Final          Passed - Patient is not pregnant      Passed - Valid encounter within last 12 months    Recent Outpatient Visits           1 month ago COPD (chronic obstructive pulmonary disease) with chronic bronchitis ([place])   [facility], Coralie Keens, NP   2 months ago Avascular necrosis of right femoral head [facility])   [place], DO   4 months ago Annual physical exam   [facility], Lupita Raider, FNP   7 months ago Gastroesophageal reflux disease without esophagitis   [facility], Lupita Raider, FNP   8 months ago Encounter to establish care   [facility], Lupita Raider, FNP       Future Appointments             In 1 week Cannady, Barbaraann Faster, NP [employer], [facility]              Refused Prescriptions  Disp Refills   famotidine (PEPCID) 20 MG tablet [Pharmacy Med Name: FAMOTIDINE 20 MG TAB] 30 tablet     Sig: TAKE 1/2 TABLET (10 MG TOTAL) BY MOUTH 2TIMES DAILY      Gastroenterology:  H2 Antagonists Passed - 07/14/2020  9:23 AM      Passed - Valid encounter within last 12 months    Recent Outpatient Visits           1 month ago COPD (chronic obstructive pulmonary disease) with chronic bronchitis ([place])   [facility], [place], NP   2 months ago Avascular necrosis of right femoral head [facility])   [place], DO   4 months ago Annual physical exam   [facility], Lupita Raider, FNP   7 months ago Gastroesophageal reflux disease without esophagitis   [place], FNP   8 months ago Encounter to establish care  [facility], Lupita Raider, FNP       Future Appointments             In 1 week Cannady, Barbaraann Faster, NP [employer]

## 2020-07-14 NOTE — Telephone Encounter (Signed)
Requested Prescriptions  Pending Prescriptions Disp Refills  . famotidine (PEPCID) 20 MG tablet [Pharmacy Med Name: FAMOTIDINE 20 MG TAB] 30 tablet     Sig: TAKE 1/2 TABLET (10 MG TOTAL) BY MOUTH 2TIMES DAILY     Gastroenterology:  H2 Antagonists Passed - 07/14/2020  9:23 AM      Passed - Valid encounter within last 12 months    Recent Outpatient Visits          1 month ago COPD (chronic obstructive pulmonary disease) with chronic bronchitis ([place])   [facility], Coralie Keens, NP   2 months ago Avascular necrosis of right femoral head [facility])   [place], DO   4 months ago Annual physical exam   [facility], Lupita Raider, FNP   7 months ago Gastroesophageal reflux disease without esophagitis   [place], FNP   8 months ago Encounter to establish care   [facility], Lupita Raider, FNP      Future Appointments            In 1 week Venita Lick, NP [employer]           . simvastatin (ZOCOR) 20 MG tablet [Pharmacy Med Name: SIMVASTATIN 20 MG TAB] 90 tablet     Sig: TAKE 1 TABLET BY MOUTH DAILY AT 6PM     Cardiovascular:  Antilipid - Statins Failed - 07/14/2020  9:23 AM      Failed - Total Cholesterol in normal range and within 360 days    Cholesterol, Total  Date Value Ref Range Status  03/18/2019 161 100 - 199 mg/dL Final         Failed - LDL in normal range and within 360 days    LDL Chol Calc (NIH)  Date Value Ref Range Status  03/18/2019 77 0 - 99 mg/dL Final         Failed - HDL in normal range and within 360 days    HDL  Date Value Ref Range Status  03/18/2019 68 >39 mg/dL Final         Failed - Triglycerides in normal range and within 360 days    Triglycerides  Date Value Ref Range Status  03/18/2019 83 0 - 149 mg/dL Final         Passed - Patient is not pregnant      Passed - Valid encounter within last 12  months    Recent Outpatient Visits          1 month ago COPD (chronic obstructive pulmonary disease) with chronic bronchitis ([place])   [facility], Coralie Keens, NP   2 months ago Avascular necrosis of right femoral head [facility])   [place], DO   4 months ago Annual physical exam   [facility], Lupita Raider, FNP   7 months ago Gastroesophageal reflux disease without esophagitis   [place], FNP   8 months ago Encounter to establish care   [facility], Lupita Raider, FNP      Future Appointments            In 1 week Cannady, Barbaraann Faster, NP [employer]

## 2020-07-16 ENCOUNTER — Other Ambulatory Visit: Payer: Self-pay | Admitting: Nurse Practitioner

## 2020-07-16 DIAGNOSIS — E7800 Pure hypercholesterolemia, unspecified: Secondary | ICD-10-CM

## 2020-07-16 NOTE — Telephone Encounter (Signed)
Scheduled 6/22

## 2020-07-16 NOTE — Telephone Encounter (Signed)
Pt. Changing PCP this month. Courtesy refill.

## 2020-07-24 ENCOUNTER — Telehealth: Payer: Self-pay

## 2020-07-24 NOTE — Telephone Encounter (Signed)
Copied from [place] [id number]. Topic: General - Other >> Jul 24, 2020  1:33 PM Leward Quan A wrote: Reason for CRM: Patient called in to inform Kayla Shah that she is very sorry about rescheduling her appointment again say that she may be facing surgery on her hip and spine and these are the things that keep her from coming to her appointments due to limitations on her daily activities. Please advise

## 2020-07-25 ENCOUNTER — Ambulatory Visit: Payer: Medicare Other | Admitting: Nurse Practitioner

## 2020-08-02 ENCOUNTER — Other Ambulatory Visit: Payer: Self-pay | Admitting: Internal Medicine

## 2020-08-02 DIAGNOSIS — K219 Gastro-esophageal reflux disease without esophagitis: Secondary | ICD-10-CM

## 2020-08-10 ENCOUNTER — Other Ambulatory Visit: Payer: Self-pay

## 2020-08-17 ENCOUNTER — Other Ambulatory Visit: Payer: Self-pay | Admitting: Nurse Practitioner

## 2020-08-17 DIAGNOSIS — K219 Gastro-esophageal reflux disease without esophagitis: Secondary | ICD-10-CM

## 2020-08-19 ENCOUNTER — Encounter: Payer: Self-pay | Admitting: Nurse Practitioner

## 2020-08-19 DIAGNOSIS — I5022 Chronic systolic (congestive) heart failure: Secondary | ICD-10-CM | POA: Insufficient documentation

## 2020-08-22 ENCOUNTER — Other Ambulatory Visit: Payer: Self-pay | Admitting: Internal Medicine

## 2020-08-22 DIAGNOSIS — E7800 Pure hypercholesterolemia, unspecified: Secondary | ICD-10-CM

## 2020-08-22 MED ORDER — SIMVASTATIN 20 MG PO TABS: ORAL_TABLET | ORAL | 0 refills | Status: DC

## 2020-08-22 NOTE — Telephone Encounter (Signed)
Copied from [place] [id number] [id number]. Topic: Quick Communication - Rx Refill/Question >> Aug 22, 2020  9:44 AM Leward Quan A wrote: Medication: simvastatin (ZOCOR) 20 MG tablet   Has the patient contacted their pharmacy? Yes.   (Agent: If no, request that the patient contact the pharmacy for the refill.) (Agent: If yes, when and what did the pharmacy advise?)  Preferred Pharmacy (with phone number or street name): [place], [place]  Phone:  [phone] Fax:  [phone]     Agent: Please be advised that RX refills may take up to 3 business days. We ask that you follow-up with your pharmacy.

## 2020-08-22 NOTE — Telephone Encounter (Signed)
   Notes to clinic:  Review for refill Looks like patient is schedule with a different pcp on 08/30/2020   Requested Prescriptions  Pending Prescriptions Disp Refills   simvastatin (ZOCOR) 20 MG tablet 30 tablet 0    Sig: TAKE 1 TABLET BY MOUTH DAILY AT 6PM      Cardiovascular:  Antilipid - Statins Failed - 08/22/2020  9:50 AM      Failed - Total Cholesterol in normal range and within 360 days    Cholesterol, Total  Date Value Ref Range Status  03/18/2019 161 100 - 199 mg/dL Final          Failed - LDL in normal range and within 360 days    LDL Chol Calc (NIH)  Date Value Ref Range Status  03/18/2019 77 0 - 99 mg/dL Final          Failed - HDL in normal range and within 360 days    HDL  Date Value Ref Range Status  03/18/2019 68 >39 mg/dL Final          Failed - Triglycerides in normal range and within 360 days    Triglycerides  Date Value Ref Range Status  03/18/2019 83 0 - 149 mg/dL Final          Passed - Patient is not pregnant      Passed - Valid encounter within last 12 months    Recent Outpatient Visits           2 months ago COPD (chronic obstructive pulmonary disease) with chronic bronchitis ([place])   [facility] [place], Coralie Keens, NP   3 months ago Avascular necrosis of right femoral head [facility])   [place], DO   6 months ago Annual physical exam   [facility], Lupita Raider, FNP   8 months ago Gastroesophageal reflux disease without esophagitis   [facility], Lupita Raider, FNP   9 months ago Encounter to establish care   [facility], Lupita Raider, FNP       Future Appointments             In 1 week [place], Scheryl Darter, NP [facility]

## 2020-08-22 NOTE — Telephone Encounter (Signed)
Patient requesting a courtesy refill of simvastatin (ZOCOR) 20 MG tablet to hold her over until 08/30/2020. Patient would like a follow up call when completed   [place], [place] Phone:  [phone]  Fax:  [phone]      Patient call back # [phone]

## 2020-08-23 ENCOUNTER — Ambulatory Visit: Payer: Medicare Other | Admitting: Nurse Practitioner

## 2020-08-30 ENCOUNTER — Encounter: Payer: Self-pay | Admitting: Nurse Practitioner

## 2020-08-30 ENCOUNTER — Ambulatory Visit (INDEPENDENT_AMBULATORY_CARE_PROVIDER_SITE_OTHER): Payer: Medicare Other | Admitting: Nurse Practitioner

## 2020-08-30 ENCOUNTER — Other Ambulatory Visit: Payer: Self-pay

## 2020-08-30 VITALS — BP 137/87 | HR 78 | Temp 97.9°F | Ht 65.0 in | Wt 132.2 lb

## 2020-08-30 DIAGNOSIS — J4489 Other specified chronic obstructive pulmonary disease: Secondary | ICD-10-CM

## 2020-08-30 DIAGNOSIS — I700 Atherosclerosis of aorta: Secondary | ICD-10-CM | POA: Diagnosis not present

## 2020-08-30 DIAGNOSIS — I10 Essential (primary) hypertension: Secondary | ICD-10-CM | POA: Diagnosis not present

## 2020-08-30 DIAGNOSIS — R569 Unspecified convulsions: Secondary | ICD-10-CM

## 2020-08-30 DIAGNOSIS — E7800 Pure hypercholesterolemia, unspecified: Secondary | ICD-10-CM

## 2020-08-30 DIAGNOSIS — F32A Depression, unspecified: Secondary | ICD-10-CM

## 2020-08-30 DIAGNOSIS — I5022 Chronic systolic (congestive) heart failure: Secondary | ICD-10-CM

## 2020-08-30 DIAGNOSIS — M1611 Unilateral primary osteoarthritis, right hip: Secondary | ICD-10-CM | POA: Diagnosis not present

## 2020-08-30 DIAGNOSIS — K219 Gastro-esophageal reflux disease without esophagitis: Secondary | ICD-10-CM

## 2020-08-30 DIAGNOSIS — F17210 Nicotine dependence, cigarettes, uncomplicated: Secondary | ICD-10-CM

## 2020-08-30 DIAGNOSIS — G43009 Migraine without aura, not intractable, without status migrainosus: Secondary | ICD-10-CM | POA: Diagnosis not present

## 2020-08-30 DIAGNOSIS — F419 Anxiety disorder, unspecified: Secondary | ICD-10-CM

## 2020-08-30 DIAGNOSIS — J449 Chronic obstructive pulmonary disease, unspecified: Secondary | ICD-10-CM

## 2020-08-30 DIAGNOSIS — D7589 Other specified diseases of blood and blood-forming organs: Secondary | ICD-10-CM

## 2020-08-30 DIAGNOSIS — Z7689 Persons encountering health services in other specified circumstances: Secondary | ICD-10-CM

## 2020-08-30 MED ORDER — PANTOPRAZOLE SODIUM 40 MG PO TBEC: 40.0000 mg | DELAYED_RELEASE_TABLET | Freq: Every day | ORAL | 0 refills | Status: DC

## 2020-08-30 NOTE — Assessment & Plan Note (Signed)
Chronic, well controlled. No headaches since starting lamictal for seizures. Continue current regimen. Follow up as needed.

## 2020-08-30 NOTE — Assessment & Plan Note (Signed)
Currently smokes 1 ppd of cigarettes. Discussed complete tobacco cessation and is not interested at this time.

## 2020-08-30 NOTE — Assessment & Plan Note (Signed)
Noted on imaging, latest 05/01/19. Continue keep BP under control and will check lipid panel today. Continue zocor.

## 2020-08-30 NOTE — Assessment & Plan Note (Signed)
CT scan reviewed from 04/28/20 which shows right hip osteoarthritis and possible avascular necrosis. She has been following with orthopedics and have recommended her for surgery. She is in the process of finding a Psychologist, sport and exercise for the procedure. She has tried motrin, ice, heat, icy hot, cymbalta, gabapentin, mobic, and tylenol with codeine for pain with minimal relief. She has done PT and steroid injections as well. I offered referral to pain management, however she said she doesn't want the referral as she is using marijuana and does not plan to stop. Discussed continued OTC treatment as mobic, cymbalta, and gabapentin did not help. Continue collaboration with orthopedics.

## 2020-08-30 NOTE — Assessment & Plan Note (Signed)
Chronic, controlled. Continue current regimen. Refills not needed at this time.

## 2020-08-30 NOTE — Assessment & Plan Note (Signed)
Chronic, controlled with lamictal. Follows with neurology and has appointment next month. Continue collaboration with neurology.

## 2020-08-30 NOTE — Assessment & Plan Note (Signed)
Chronic, stable. Well controlled on current regimen. Will check CMP, CBC, and TSH. Will refill medication when needed. Follow-up in 6 months.

## 2020-08-30 NOTE — Progress Notes (Signed)
New Patient Office Visit  Subjective:  Patient ID: Kayla Shah, female    DOB: 1955-02-17  Age: 65 y.o. MRN: [id number]  CC:  Chief Complaint  Patient presents with   Establish Care   Pain    Patient states her hip needs to be replaced, but is currently waiting on surgery. Patient states her right leg and right hip is. Orthopedist advised her to reach out to her PCP for pain medication. Patient has tried motrin and gabapentin for pain but does not help, states steroid shots did not help either.     HPI Kayla Shah presents for new patient visit to establish care.  Introduced to Designer, jewellery role and practice setting.  All questions answered.  Discussed provider/patient relationship and expectations.  HIP PAIN  Seeing orthopedics, looking for surgery. Needs right hip replacement and may need surgery on lower back.  Duration: months since November Involved hip: right  Mechanism of injury: unknown Location: diffuse Onset: gradual  Severity: 9/10  Quality: throbbing Frequency: constant but sometimes worse than others Radiation: yes to right knee Aggravating factors: walking, bending, and movement   Alleviating factors: NSAIDs and rest  Status: fluctuating Treatments attempted: rest, ice, heat, ibuprofen, aleve, and physical therapy, meloxicam, voltaren, icy hot, tylenol with codeine, percocet. Only the percocet seems to help   Relief with NSAIDs?: no Weakness with weight bearing: yes Weakness with walking: yes Paresthesias / decreased sensation: no Swelling: no Redness:no Fevers: no  HYPERTENSION / HYPERLIPIDEMIA  Satisfied with current treatment? yes Duration of hypertension: chronic BP monitoring frequency: not checking BP range: n/a BP medication side effects: no Past BP meds: verapamil Duration of hyperlipidemia: chronic Cholesterol medication side effects: no Cholesterol supplements: none Past cholesterol medications: simvastatin  (zocor) Medication compliance: excellent compliance Aspirin: no Recent stressors: no Recurrent headaches: no Visual changes: no Palpitations: no Dyspnea: no Chest pain: no Lower extremity edema: no Dizzy/lightheaded: no   Past Medical History:  Diagnosis Date   Allergy    Anxiety    agoraphobia    Arthritis    Chicken pox    COPD (chronic obstructive pulmonary disease) (HCC)    Depression    Family history of adverse reaction to anesthesia    PONV   GERD (gastroesophageal reflux disease)    Headache    History of blood transfusion    1984   Hyperlipidemia    Hypertension    Migraine    PONV (postoperative nausea and vomiting)    Seizure ([place])    last seizure 04/2019    Past Surgical History:  Procedure Laterality Date   ABDOMINAL HYSTERECTOMY  1984   Total   APPENDECTOMY  1984   CATARACT EXTRACTION, BILATERAL Bilateral    COLONOSCOPY WITH PROPOFOL N/A 03/26/2017   Procedure: COLONOSCOPY WITH PROPOFOL;  Surgeon: Jonathon Bellows, MD;  Location: [facility] ENDOSCOPY;  Service: Gastroenterology;  Laterality: N/A;   VENTRAL HERNIA REPAIR N/A 09/16/2019   Procedure: HERNIA REPAIR VENTRAL ADULT, Open;  Surgeon: Fredirick Maudlin, MD;  Location: [facility] ORS;  Service: General;  Laterality: N/A;   WISDOM TOOTH EXTRACTION      Family History  Problem Relation Age of Onset   Stroke Mother    Depression Mother    Arthritis Mother    Heart disease Father    Alcohol abuse Father    Diabetes Father    Osteoporosis Sister    Kidney disease Sister    Arthritis Sister    Dementia Sister    Arthritis Maternal  Grandmother    Heart disease Maternal Grandmother    Hyperlipidemia Maternal Grandmother    Diabetes Paternal Grandmother    Breast cancer Neg Hx     Social History   Socioeconomic History   Marital status: Divorced    Spouse name: Legrand Como   Number of children: 0   Years of education: 12   Highest education level: Not on file  Occupational History    Comment: disabled   Tobacco Use   Smoking status: Every Day    Packs/day: 1.00    Years: 45.00    Pack years: 45.00    Types: Cigarettes   Smokeless tobacco: Never   Tobacco comments:    reports quitting in feb 2021  Vaping Use   Vaping Use: Never used  Substance and Sexual Activity   Alcohol use: No   Drug use: Yes    Types: Marijuana   Sexual activity: Never  Other Topics Concern   Not on file  Social History Narrative   Disabled    Married no kids    No guns, wears seat belt, feels safe in relationship    12th grade ed.    Social Determinants of Health   Financial Resource Strain: Not on file  Food Insecurity: Not on file  Transportation Needs: Not on file  Physical Activity: Not on file  Stress: Not on file  Social Connections: Not on file  Intimate Partner Violence: Not on file    ROS Review of Systems  Constitutional: Negative.   HENT: Negative.    Eyes: Negative.   Respiratory: Negative.    Cardiovascular: Negative.   Gastrointestinal: Negative.   Genitourinary: Negative.   Musculoskeletal:  Positive for arthralgias (right hip pain).  Skin: Negative.   Neurological: Negative.   Psychiatric/Behavioral:  The patient is nervous/anxious.    Objective:   Today's Vitals: BP 137/87   Pulse 78   Temp 97.9 F (36.6 C)   Ht '5\' 5"'$  (1.651 m)   Wt 132 lb 3.2 oz (60 kg)   SpO2 97%   BMI 22.00 kg/m   Physical Exam Vitals and nursing note reviewed.  Constitutional:      General: She is not in acute distress.    Appearance: Normal appearance.  HENT:     Head: Normocephalic.  Eyes:     Conjunctiva/sclera: Conjunctivae normal.  Cardiovascular:     Rate and Rhythm: Normal rate and regular rhythm.     Pulses: Normal pulses.     Heart sounds: Normal heart sounds.  Pulmonary:     Effort: Pulmonary effort is normal.     Breath sounds: Normal breath sounds.  Musculoskeletal:        General: Tenderness (to right hip upon palpation) present.     Cervical back: Normal range of  motion.     Right lower leg: No edema.     Left lower leg: No edema.  Skin:    General: Skin is warm.  Neurological:     General: No focal deficit present.     Mental Status: She is alert and oriented to person, place, and time.  Psychiatric:        Mood and Affect: Mood normal.        Behavior: Behavior normal.        Thought Content: Thought content normal.        Judgment: Judgment normal.    Assessment & Plan:   Problem List Items Addressed This Visit  Cardiovascular and Mediastinum   Essential (primary) hypertension - Primary    Chronic, stable. Well controlled on current regimen. Will check CMP, CBC, and TSH. Will refill medication when needed. Follow-up in 6 months.        Relevant Orders   CBC with Differential/Platelet   Comprehensive metabolic panel   TSH   Migraine    Chronic, well controlled. No headaches since starting lamictal for seizures. Continue current regimen. Follow up as needed.        Aortic atherosclerosis ([place])    Noted on imaging, latest 05/01/19. Continue keep BP under control and will check lipid panel today. Continue zocor.        Relevant Orders   Lipid Panel w/o Chol/HDL Ratio   Chronic HFrEF (heart failure with reduced ejection fraction) (HCC)    Chronic, stable. She was not aware that she has heart failure. EF 06/13/18 45-50% with impaired LV relaxation and moderate wall thickening. Continue verapamil for now as it may be helping with migraine control as well. Can consider entresto or ACE/ARB in the future if symptoms worsen. No evidence of volume overload on exam. Not currently on any diuretics. Follow-up with any concerns or worsening symptoms.          Respiratory   COPD (chronic obstructive pulmonary disease) with chronic bronchitis (HCC)    Chronic, well controlled. Not currently on any inhalers and denies shortness of breath and cough. Will check spirometry next visit.          Digestive   Gastroesophageal reflux  disease without esophagitis    Chronic, controlled. Continue current regimen. Refills not needed at this time.        Relevant Medications   pantoprazole (PROTONIX) 40 MG tablet     Musculoskeletal and Integument   Osteoarthritis of right hip    CT scan reviewed from 04/28/20 which shows right hip osteoarthritis and possible avascular necrosis. She has been following with orthopedics and have recommended her for surgery. She is in the process of finding a Psychologist, sport and exercise for the procedure. She has tried motrin, ice, heat, icy hot, cymbalta, gabapentin, mobic, and tylenol with codeine for pain with minimal relief. She has done PT and steroid injections as well. I offered referral to pain management, however she said she doesn't want the referral as she is using marijuana and does not plan to stop. Discussed continued OTC treatment as mobic, cymbalta, and gabapentin did not help. Continue collaboration with orthopedics.          Other   Pure hypercholesterolemia    Chronic. Continue zocor daily. Will check lipid panel today and adjust regimen as needed. Follow-up in 6 months.        Relevant Orders   Lipid Panel w/o Chol/HDL Ratio   Seizures (HCC)    Chronic, controlled with lamictal. Follows with neurology and has appointment next month. Continue collaboration with neurology.        Anxiety and depression    Chronic, controlled. Feels her symptoms are more anxiety than depression. She follows with psychiatry. Continue collaboration and recommendations from psychiatry.        Cigarette nicotine dependence without complication    Currently smokes 1 ppd of cigarettes. Discussed complete tobacco cessation and is not interested at this time.        Other Visit Diagnoses     Macrocytosis       Will check vitamin B12 and folate. May be related to smoking   Relevant Orders  Vitamin B12   Folate   Encounter to establish care           Outpatient Encounter Medications as of 08/30/2020   Medication Sig   diazepam (VALIUM) 5 MG tablet Take 5 mg by mouth 3 (three) times daily as needed.   famotidine (PEPCID) 20 MG tablet TAKE 1/2 TABLET (10 MG TOTAL) BY MOUTH 2TIMES DAILY   lamoTRIgine (LAMICTAL) 100 MG tablet Take 1 tablet (100 mg total) by mouth 2 (two) times daily.   [name] 1.25 MG tablet TAKE 1 TABLET BY MOUTH DAILY   rizatriptan (MAXALT-MLT) 10 MG disintegrating tablet Take 1 tablet (10 mg total) by mouth as needed for migraine. May repeat in 2 hours if needed   simvastatin (ZOCOR) 20 MG tablet TAKE 1 TABLET BY MOUTH DAILY AT 6PM   verapamil (CALAN-SR) 240 MG CR tablet Take 1 tablet (240 mg total) by mouth daily.   [DISCONTINUED] pantoprazole (PROTONIX) 40 MG tablet TAKE 1 TABLET BY MOUTH TWICE A DAY   pantoprazole (PROTONIX) 40 MG tablet Take 1 tablet (40 mg total) by mouth daily.   [DISCONTINUED] acetaminophen-codeine (TYLENOL #3) 300-30 MG tablet Take 1 tablet by mouth daily as needed for moderate pain. (Patient not taking: Reported on 08/30/2020)   [DISCONTINUED] gabapentin (NEURONTIN) 300 MG capsule Take 1 capsule (300 mg total) by mouth 3 (three) times daily. (Patient not taking: Reported on 08/30/2020)   No facility-administered encounter medications on file as of 08/30/2020.    Follow-up: Return in about 3 months (around 11/30/2020) for physical .   Charyl Dancer, NP

## 2020-08-30 NOTE — Assessment & Plan Note (Signed)
Chronic, stable. She was not aware that she has heart failure. EF 06/13/18 45-50% with impaired LV relaxation and moderate wall thickening. Continue verapamil for now as it may be helping with migraine control as well. Can consider entresto or ACE/ARB in the future if symptoms worsen. No evidence of volume overload on exam. Not currently on any diuretics. Follow-up with any concerns or worsening symptoms.

## 2020-08-30 NOTE — Assessment & Plan Note (Signed)
Chronic. Continue zocor daily. Will check lipid panel today and adjust regimen as needed. Follow-up in 6 months.

## 2020-08-30 NOTE — Assessment & Plan Note (Signed)
Chronic, controlled. Feels her symptoms are more anxiety than depression. She follows with psychiatry. Continue collaboration and recommendations from psychiatry.

## 2020-08-30 NOTE — Assessment & Plan Note (Addendum)
Chronic, well controlled. Not currently on any inhalers and denies shortness of breath and cough. Will check spirometry next visit.

## 2020-08-31 ENCOUNTER — Telehealth: Payer: Self-pay | Admitting: Neurology

## 2020-08-31 LAB — CBC WITH DIFFERENTIAL/PLATELET
Basophils Absolute: 0.1 10*3/uL (ref 0.0–0.2)
Basos: 1 %
EOS (ABSOLUTE): 0.1 10*3/uL (ref 0.0–0.4)
Eos: 1 %
Hematocrit: 38.1 % (ref 34.0–46.6)
Hemoglobin: 12.7 g/dL (ref 11.1–15.9)
Immature Grans (Abs): 0 10*3/uL (ref 0.0–0.1)
Immature Granulocytes: 0 %
Lymphocytes Absolute: 1.9 10*3/uL (ref 0.7–3.1)
Lymphs: 28 %
MCH: 32.8 pg (ref 26.6–33.0)
MCHC: 33.3 g/dL (ref 31.5–35.7)
MCV: 98 fL — ABNORMAL HIGH (ref 79–97)
Monocytes Absolute: 0.4 10*3/uL (ref 0.1–0.9)
Monocytes: 6 %
Neutrophils Absolute: 4.3 10*3/uL (ref 1.4–7.0)
Neutrophils: 64 %
Platelets: 223 10*3/uL (ref 150–450)
RBC: 3.87 x10E6/uL (ref 3.77–5.28)
RDW: 12.3 % (ref 11.7–15.4)
WBC: 6.7 10*3/uL (ref 3.4–10.8)

## 2020-08-31 LAB — COMPREHENSIVE METABOLIC PANEL
ALT: 7 IU/L (ref 0–32)
AST: 11 IU/L (ref 0–40)
Albumin/Globulin Ratio: 1.7 (ref 1.2–2.2)
Albumin: 4.3 g/dL (ref 3.8–4.8)
Alkaline Phosphatase: 76 IU/L (ref 44–121)
BUN/Creatinine Ratio: 21 (ref 12–28)
BUN: 18 mg/dL (ref 8–27)
Bilirubin Total: 0.2 mg/dL (ref 0.0–1.2)
CO2: 25 mmol/L (ref 20–29)
Calcium: 8.9 mg/dL (ref 8.7–10.3)
Chloride: 100 mmol/L (ref 96–106)
Creatinine, Ser: 0.87 mg/dL (ref 0.57–1.00)
Globulin, Total: 2.6 g/dL (ref 1.5–4.5)
Glucose: 84 mg/dL (ref 65–99)
Potassium: 3.9 mmol/L (ref 3.5–5.2)
Sodium: 141 mmol/L (ref 134–144)
Total Protein: 6.9 g/dL (ref 6.0–8.5)
eGFR: 74 mL/min/{1.73_m2} (ref >59)

## 2020-08-31 LAB — LIPID PANEL W/O CHOL/HDL RATIO
Cholesterol, Total: 183 mg/dL (ref 100–199)
HDL: 76 mg/dL (ref >39)
LDL Chol Calc (NIH): 90 mg/dL (ref 0–99)
Triglycerides: 94 mg/dL (ref 0–149)
VLDL Cholesterol Cal: 17 mg/dL (ref 5–40)

## 2020-08-31 LAB — TSH: TSH: 1.18 u[IU]/mL (ref 0.450–4.500)

## 2020-08-31 LAB — VITAMIN B12: Vitamin B-12: 199 pg/mL — ABNORMAL LOW (ref 232–1245)

## 2020-08-31 LAB — FOLATE: Folate: 8.3 ng/mL (ref >3.0)

## 2020-08-31 MED ORDER — LAMOTRIGINE 100 MG PO TABS: 100.0000 mg | ORAL_TABLET | Freq: Two times a day (BID) | ORAL | 3 refills | Status: DC

## 2020-08-31 NOTE — Telephone Encounter (Signed)
Patient requested refill on Lamictal.  Refill sent, she has an appointment pending.

## 2020-09-06 ENCOUNTER — Telehealth: Payer: Self-pay

## 2020-09-06 MED ORDER — GABAPENTIN 300 MG PO CAPS: 300.0000 mg | ORAL_CAPSULE | Freq: Two times a day (BID) | ORAL | 1 refills | Status: DC

## 2020-09-06 NOTE — Telephone Encounter (Signed)
Copied from [place] [id number]. Topic: General - Other >> Sep 06, 2020  7:49 AM Holley Dexter N wrote: Reason for CRM: Pt stated when she was in the office PCP offered her Gabapentin, and declined, but pt changed her mind wants to be put on the medication now. Please advise.  [place], [place]  Phone:  [phone] Fax:  [phone]   Routing to provider to advise on patient's message.

## 2020-09-06 NOTE — Telephone Encounter (Signed)
Spoke with patient to inform her that her prescription was sent over to her local pharmacy. Patient verbalized understanding and has no further questions.

## 2020-09-13 ENCOUNTER — Encounter: Payer: Self-pay | Admitting: Nurse Practitioner

## 2020-09-13 ENCOUNTER — Other Ambulatory Visit: Payer: Self-pay

## 2020-09-13 ENCOUNTER — Ambulatory Visit (INDEPENDENT_AMBULATORY_CARE_PROVIDER_SITE_OTHER): Payer: Medicare Other | Admitting: Nurse Practitioner

## 2020-09-13 ENCOUNTER — Other Ambulatory Visit: Payer: Self-pay | Admitting: Internal Medicine

## 2020-09-13 ENCOUNTER — Telehealth: Payer: Self-pay

## 2020-09-13 VITALS — BP 119/78 | HR 76 | Temp 98.6°F

## 2020-09-13 DIAGNOSIS — R350 Frequency of micturition: Secondary | ICD-10-CM | POA: Diagnosis not present

## 2020-09-13 DIAGNOSIS — M1611 Unilateral primary osteoarthritis, right hip: Secondary | ICD-10-CM | POA: Diagnosis not present

## 2020-09-13 DIAGNOSIS — E7800 Pure hypercholesterolemia, unspecified: Secondary | ICD-10-CM

## 2020-09-13 LAB — URINALYSIS, ROUTINE W REFLEX MICROSCOPIC
Bilirubin, UA: NEGATIVE
Glucose, UA: NEGATIVE
Ketones, UA: NEGATIVE
Leukocytes,UA: NEGATIVE
Nitrite, UA: NEGATIVE
Protein,UA: NEGATIVE
RBC, UA: NEGATIVE
Specific Gravity, UA: >1.03 — ABNORMAL HIGH (ref 1.005–1.030)
Urobilinogen, Ur: 0.2 mg/dL (ref 0.2–1.0)
pH, UA: 5.5 (ref 5.0–7.5)

## 2020-09-13 MED ORDER — GABAPENTIN 300 MG PO CAPS: 300.0000 mg | ORAL_CAPSULE | Freq: Three times a day (TID) | ORAL | 1 refills | Status: DC

## 2020-09-13 NOTE — Telephone Encounter (Signed)
Requested medication (s) are due for refill today: yes  Requested medication (s) are on the active medication list: yes  Last refill:  08/22/20 #30 0 refills  Future visit scheduled: yes  Notes to clinic:  last order by Garnette Gunner, NP. Seen at [facility] and future visit scheduled in 2 months . Do you want to refill Rx?     Requested Prescriptions  Pending Prescriptions Disp Refills   simvastatin (ZOCOR) 20 MG tablet [Pharmacy Med Name: SIMVASTATIN 20 MG TAB] 30 tablet 0    Sig: TAKE 1 TABLET BY MOUTH DAILY AT 6 PM     Cardiovascular:  Antilipid - Statins Passed - 09/13/2020 10:30 AM      Passed - Total Cholesterol in normal range and within 360 days    Cholesterol, Total  Date Value Ref Range Status  08/30/2020 183 100 - 199 mg/dL Final          Passed - LDL in normal range and within 360 days    LDL Chol Calc (NIH)  Date Value Ref Range Status  08/30/2020 90 0 - 99 mg/dL Final          Passed - HDL in normal range and within 360 days    HDL  Date Value Ref Range Status  08/30/2020 76 >39 mg/dL Final          Passed - Triglycerides in normal range and within 360 days    Triglycerides  Date Value Ref Range Status  08/30/2020 94 0 - 149 mg/dL Final          Passed - Patient is not pregnant      Passed - Valid encounter within last 12 months    Recent Outpatient Visits           2 weeks ago Osteoarthritis of right hip, unspecified osteoarthritis type   [facility] McElwee, Lauren A, NP   3 months ago COPD (chronic obstructive pulmonary disease) with chronic bronchitis ([place])   [facility] [place], Coralie Keens, NP   4 months ago Avascular necrosis of right femoral head [facility])   [place], DO   6 months ago Annual physical exam   [facility], Lupita Raider, FNP   9 months ago Gastroesophageal reflux disease without esophagitis   [facility], Lupita Raider, FNP        Future Appointments             In 2 months McElwee, Scheryl Darter, NP [place], [facility]

## 2020-09-13 NOTE — Progress Notes (Signed)
Acute Office Visit  Subjective:    Patient ID: Kayla Shah, female    DOB: 1955-08-09, 65 y.o.   MRN: [id number]  Chief Complaint  Patient presents with   Urinary Frequency    Patient states for the last few months she has been having issues as to where she has to use the restroom and some days it feels as if she has not went in a few days and there's a lot and then some days where it is nothing but a drop. Patient denies having any pain or burning with urination. Patient states has been unable to go and try anything OTC.     HPI Patient is in today for urinary frequency and feels like she is not completely emptying her bladder. She states that some days she can empty her bladder completely and other days where she can't. She endorses one episode of urinary incontinence. This has been going on for the past several months. She denies dysuria, burning, and abdominal pain and pressure. She has not tried any treatments at home.   She is also continuing to follow with orthopedics and trying to get a referral for hip surgery. She is still having severe pain in her right hip and leg. She said the gabapentin twice a day hasn't helped any.   Past Medical History:  Diagnosis Date   Allergy    Anxiety    agoraphobia    Arthritis    Chicken pox    COPD (chronic obstructive pulmonary disease) ([place])    Depression    Family history of adverse reaction to anesthesia    PONV   GERD (gastroesophageal reflux disease)    Headache    History of blood transfusion    1984   Hyperlipidemia    Hypertension    Migraine    PONV (postoperative nausea and vomiting)    Seizure ([place])    last seizure 04/2019    Past Surgical History:  Procedure Laterality Date   ABDOMINAL HYSTERECTOMY  1984   Total   APPENDECTOMY  1984   CATARACT EXTRACTION, BILATERAL Bilateral    COLONOSCOPY WITH PROPOFOL N/A 03/26/2017   Procedure: COLONOSCOPY WITH PROPOFOL;  Surgeon: Jonathon Bellows, MD;  Location: [facility]  ENDOSCOPY;  Service: Gastroenterology;  Laterality: N/A;   VENTRAL HERNIA REPAIR N/A 09/16/2019   Procedure: HERNIA REPAIR VENTRAL ADULT, Open;  Surgeon: Fredirick Maudlin, MD;  Location: [facility] ORS;  Service: General;  Laterality: N/A;   WISDOM TOOTH EXTRACTION      Family History  Problem Relation Age of Onset   Stroke Mother    Depression Mother    Arthritis Mother    Heart disease Father    Alcohol abuse Father    Diabetes Father    Osteoporosis Sister    Kidney disease Sister    Arthritis Sister    Dementia Sister    Arthritis Maternal Grandmother    Heart disease Maternal Grandmother    Hyperlipidemia Maternal Grandmother    Diabetes Paternal Grandmother    Breast cancer Neg Hx     Social History   Socioeconomic History   Marital status: Divorced    Spouse name: Legrand Como   Number of children: 0   Years of education: 12   Highest education level: Not on file  Occupational History    Comment: disabled  Tobacco Use   Smoking status: Every Day    Packs/day: 1.00    Years: 45.00    Pack years: 45.00  Types: Cigarettes   Smokeless tobacco: Never   Tobacco comments:    reports quitting in feb 2021  Vaping Use   Vaping Use: Never used  Substance and Sexual Activity   Alcohol use: No   Drug use: Yes    Types: Marijuana   Sexual activity: Never  Other Topics Concern   Not on file  Social History Narrative   Disabled    Married no kids    No guns, wears seat belt, feels safe in relationship    12th grade ed.    Social Determinants of Health   Financial Resource Strain: Not on file  Food Insecurity: Not on file  Transportation Needs: Not on file  Physical Activity: Not on file  Stress: Not on file  Social Connections: Not on file  Intimate Partner Violence: Not on file    Outpatient Medications Prior to Visit  Medication Sig Dispense Refill   diazepam (VALIUM) 5 MG tablet Take 5 mg by mouth 3 (three) times daily as needed.     famotidine (PEPCID) 20 MG  tablet TAKE 1/2 TABLET (10 MG TOTAL) BY MOUTH 2TIMES DAILY 30 tablet 0   lamoTRIgine (LAMICTAL) 100 MG tablet Take 1 tablet (100 mg total) by mouth 2 (two) times daily. 180 tablet 3   pantoprazole (PROTONIX) 40 MG tablet Take 1 tablet (40 mg total) by mouth daily. 90 tablet 0   [name] 1.25 MG tablet TAKE 1 TABLET BY MOUTH DAILY 90 tablet 3   rizatriptan (MAXALT-MLT) 10 MG disintegrating tablet Take 1 tablet (10 mg total) by mouth as needed for migraine. May repeat in 2 hours if needed 9 tablet 11   simvastatin (ZOCOR) 20 MG tablet TAKE 1 TABLET BY MOUTH DAILY AT 6 PM 90 tablet 1   verapamil (CALAN-SR) 240 MG CR tablet Take 1 tablet (240 mg total) by mouth daily. 90 tablet 1   gabapentin (NEURONTIN) 300 MG capsule Take 1 capsule (300 mg total) by mouth 2 (two) times daily. Start 1 capsule at bedtime x3 days, then take twice a day 180 capsule 1   No facility-administered medications prior to visit.    Allergies  Allergen Reactions   Codeine Nausea And Vomiting   Fluoxetine Anxiety   Paroxetine Anxiety    Becomes very aggressive with mood swings.    Review of Systems  Constitutional:  Positive for fatigue.  HENT: Negative.    Respiratory: Negative.    Cardiovascular: Negative.   Gastrointestinal: Negative.   Genitourinary:  Positive for frequency. Negative for dysuria and vaginal discharge.  Musculoskeletal:  Positive for arthralgias (right hip pain).  Neurological: Negative.       Objective:    Physical Exam Vitals and nursing note reviewed.  Constitutional:      General: She is not in acute distress.    Appearance: Normal appearance.  HENT:     Head: Normocephalic.  Eyes:     Conjunctiva/sclera: Conjunctivae normal.  Cardiovascular:     Rate and Rhythm: Normal rate and regular rhythm.     Pulses: Normal pulses.     Heart sounds: Normal heart sounds.  Pulmonary:     Effort: Pulmonary effort is normal.     Breath sounds: Normal breath sounds.  Abdominal:      Palpations: Abdomen is soft.     Tenderness: There is no abdominal tenderness.  Musculoskeletal:     Cervical back: Normal range of motion.  Skin:    General: Skin is warm.  Neurological:  General: No focal deficit present.     Mental Status: She is alert and oriented to person, place, and time.  Psychiatric:        Mood and Affect: Mood normal.        Behavior: Behavior normal.        Thought Content: Thought content normal.        Judgment: Judgment normal.    BP 119/78   Pulse 76   Temp 98.6 F (37 C) (Oral)   SpO2 98%  Wt Readings from Last 3 Encounters:  08/30/20 132 lb 3.2 oz (60 kg)  05/31/20 131 lb 12.8 oz (59.8 kg)  05/03/20 138 lb 3.2 oz (62.7 kg)    Health Maintenance Due  Topic Date Due   Pneumococcal Vaccine 47-53 Years old (1 - PCV) Never done   TETANUS/TDAP  Never done   INFLUENZA VACCINE  09/03/2020    There are no preventive care reminders to display for this patient.   Lab Results  Component Value Date   TSH 1.180 08/30/2020   Lab Results  Component Value Date   WBC 6.7 08/30/2020   HGB 12.7 08/30/2020   HCT 38.1 08/30/2020   MCV 98 (H) 08/30/2020   PLT 223 08/30/2020   Lab Results  Component Value Date   NA 141 08/30/2020   K 3.9 08/30/2020   CO2 25 08/30/2020   GLUCOSE 84 08/30/2020   BUN 18 08/30/2020   CREATININE 0.87 08/30/2020   BILITOT 0.2 08/30/2020   ALKPHOS 76 08/30/2020   AST 11 08/30/2020   ALT 7 08/30/2020   PROT 6.9 08/30/2020   ALBUMIN 4.3 08/30/2020   CALCIUM 8.9 08/30/2020   ANIONGAP 6 05/03/2019   EGFR 74 08/30/2020   GFR 59.88 (L) 08/05/2017   Lab Results  Component Value Date   CHOL 183 08/30/2020   Lab Results  Component Value Date   HDL 76 08/30/2020   Lab Results  Component Value Date   LDLCALC 90 08/30/2020   Lab Results  Component Value Date   TRIG 94 08/30/2020   Lab Results  Component Value Date   CHOLHDL 2.1 06/12/2018   Lab Results  Component Value Date   HGBA1C 5.1 11/11/2019        Assessment & Plan:   Problem List Items Addressed This Visit       Musculoskeletal and Integument   Osteoarthritis of right hip    Pain is still not controlled. Will increase gabapentin to 311m TID. Encouraged her to reach out to her orthopedic to see if they have gotten a referral to surgeon yet.         Other   Urinary frequency - Primary    At times she feels like she is not completely emptying her bladder. U/A negative for UTI. Discussed kegel exercise and educated her on double voiding. Medication list reviewed and no medications noted that could cause urinary retention. She is not interested in a referral to urology at this time. If pelvic floor exercise and double voiding does not help symptoms, will place referral to urology.       Relevant Orders   Urinalysis, Routine w reflex microscopic (Completed)     Meds ordered this encounter  Medications   gabapentin (NEURONTIN) 300 MG capsule    Sig: Take 1 capsule (300 mg total) by mouth 3 (three) times daily. Start 1 capsule at bedtime x3 days, then take twice a day    Dispense:  180 capsule  Refill:  [place], NP

## 2020-09-13 NOTE — Telephone Encounter (Signed)
Needs appointment

## 2020-09-13 NOTE — Telephone Encounter (Signed)
Appointment scheduled.

## 2020-09-13 NOTE — Assessment & Plan Note (Signed)
At times she feels like she is not completely emptying her bladder. U/A negative for UTI. Discussed kegel exercise and educated her on double voiding. Medication list reviewed and no medications noted that could cause urinary retention. She is not interested in a referral to urology at this time. If pelvic floor exercise and double voiding does not help symptoms, will place referral to urology.

## 2020-09-13 NOTE — Telephone Encounter (Signed)
Routing to provider to advise on patient's concern.

## 2020-09-13 NOTE — Assessment & Plan Note (Signed)
Pain is still not controlled. Will increase gabapentin to '300mg'$  TID. Encouraged her to reach out to her orthopedic to see if they have gotten a referral to surgeon yet.

## 2020-09-13 NOTE — Patient Instructions (Signed)
Try to double void: when going to the bathroom, pee what you can, then stand up, then sit back down and try to pee the rest of the urine

## 2020-09-13 NOTE — Telephone Encounter (Signed)
Copied from [place] [phone]. Topic: General - Other >> Sep 13, 2020  9:58 AM Leward Quan A wrote: Reason for CRM: Patient called in to inform Vance Peper that she is not emptying her bladder when she uses the bathroom. Say she limit her fluid intake at night but gets up several times of during the night to urinate. Also state that she is not able to access her VM so don't leave a voice mail for now.  Ph# [phone]

## 2020-09-13 NOTE — Telephone Encounter (Signed)
Appt today

## 2020-09-18 NOTE — Progress Notes (Signed)
PATIENT: Kayla Shah DOB: Jan 09, 1956  REASON FOR VISIT: follow up HISTORY FROM: patient  Chief Complaint  Patient presents with   Follow-up    Rm 2, w husband Micheal. Here for 1 year f/u for sz. Reports doing well. Reports no sz like activity or migraines.       HISTORY OF PRESENT ILLNESS: 09/19/20 ALL:  Kayla Shah returns for follow up for seizures and migraines. She reports doing very well. She continues lamotrigine '100mg'$  BID and rizatriptan as needed. She rarely has headaches. She can not remember the last time she needed rizatriptan. She feels tremor is better off divalproex. Some action tremor, especially if anxious or tired. She denies seizure activity. She is having right hip pain. She is trying to find a surgeon who will consider hip replacement.    09/20/2019 ALL:  Kayla Shah [place] is a 65 y.o. female here today for follow up for seizures and migraines. She reports doing well. She has tolerated lamotrigine well. She feels that tremor has significantly improved since discontinuing divalproex. She can write again. She can hold a coffee cup. She feels that migraines are well managed. She can't remember the last time she took rizatriptan. She is very happy with current treatment regimen. She is also very proud to report that she recently quit smoking. She is feeling better physically.   HISTORY: (copied from my note on 04/27/2019)  Kayla Shah is a 65 y.o. female here today for follow up for seizures. She continues divalproex '750mg'$  twice daily. She reports that tremors have significantly worsened. She is unable to hold a coffee cup. She has had significant weight loss as well. She does endorse depression and anxiety but has been followed closely by PCP and decline medications for this. She denies difficulty swallowing. No changes in gait. She denies changes in movements with exception of tremors. No family history of PD. Symptoms have progressively worsened since starting  divalproex in May 2020. No seizure activity. She does not drive. PCP started Megace last month. Headaches are well managed at this time.    HISTORY: (copied from my note on 09/06/2018)   Kayla Shah is a 65 y.o. female here today for follow up for seizure. She was [facility] to [facility] following two seizures at home. Depakote was subtherapeutic on admission. She denies missed doses. Dose was increased to '750mg'$  twice daily. She has noticed an increase in tremor since increasing dose but is tolerating well otherwise. She denies seizure activity. She is feeling well today and without complaints.    Upon review of ER notes, she had elevated troponin and abnormal EKG (inferior ST depression). Cardiology consulted and advised additional testing. She has extensive family history of cardiovascular disease. She is treated for HTN and told that lipids are elevated (not currently treated). She is smoking with no desire to quit.    HISTORY: (copied from Dr Gladstone Lighter note on 11/18/2017)   65 year old female here for evaluation of seizure and migraine.   Patient first developed seizures in 83s.  She was on Keppra at that time.  At some point she was taken off of Keppra.  2014 she was hospitalized for seizure.  She may have been on and off of Keppra following 2014 but she is not sure.  Patient reports grand mal seizures and postictal confusion symptoms but is not quite sure about the frequency.  She had another seizure possibly in September 2019.  She is not on antiseizure medication at  this time.   Patient has history of migraine headaches at age 61 years old with pounding throbbing headaches, nausea, photophobia, typically unilateral right greater than left-sided headaches.  She averages 4-5 headaches per month.  She is not sure what type of medication she has been on the past.   Patient also has history of depression, anxiety, stress, weight loss, chronic pain.     REVIEW OF  SYSTEMS: Out of a complete 14 system review of symptoms, the patient complains only of the following symptoms, hip pain and all other reviewed systems are negative.  ALLERGIES: Allergies  Allergen Reactions   Codeine Nausea And Vomiting   Fluoxetine Anxiety   Paroxetine Anxiety    Becomes very aggressive with mood swings.    HOME MEDICATIONS: Outpatient Medications Prior to Visit  Medication Sig Dispense Refill   diazepam (VALIUM) 5 MG tablet Take 5 mg by mouth 3 (three) times daily as needed.     famotidine (PEPCID) 20 MG tablet TAKE 1/2 TABLET (10 MG TOTAL) BY MOUTH 2TIMES DAILY 30 tablet 0   pantoprazole (PROTONIX) 40 MG tablet Take 1 tablet (40 mg total) by mouth daily. 90 tablet 0   [name] 1.25 MG tablet TAKE 1 TABLET BY MOUTH DAILY 90 tablet 3   simvastatin (ZOCOR) 20 MG tablet TAKE 1 TABLET BY MOUTH DAILY AT 6 PM 90 tablet 1   verapamil (CALAN-SR) 240 MG CR tablet Take 1 tablet (240 mg total) by mouth daily. 90 tablet 1   gabapentin (NEURONTIN) 300 MG capsule Take 1 capsule (300 mg total) by mouth 3 (three) times daily. Start 1 capsule at bedtime x3 days, then take twice a day 180 capsule 1   lamoTRIgine (LAMICTAL) 100 MG tablet Take 1 tablet (100 mg total) by mouth 2 (two) times daily. 180 tablet 3   rizatriptan (MAXALT-MLT) 10 MG disintegrating tablet Take 1 tablet (10 mg total) by mouth as needed for migraine. May repeat in 2 hours if needed 9 tablet 11   No facility-administered medications prior to visit.    PAST MEDICAL HISTORY: Past Medical History:  Diagnosis Date   Allergy    Anxiety    agoraphobia    Arthritis    Chicken pox    COPD (chronic obstructive pulmonary disease) ([place])    Depression    Family history of adverse reaction to anesthesia    PONV   GERD (gastroesophageal reflux disease)    Headache    History of blood transfusion    1984   Hyperlipidemia    Hypertension    Migraine    PONV (postoperative nausea and vomiting)    Seizure ([place])     last seizure 04/2019    PAST SURGICAL HISTORY: Past Surgical History:  Procedure Laterality Date   ABDOMINAL HYSTERECTOMY  1984   Total   APPENDECTOMY  1984   CATARACT EXTRACTION, BILATERAL Bilateral    COLONOSCOPY WITH PROPOFOL N/A 03/26/2017   Procedure: COLONOSCOPY WITH PROPOFOL;  Surgeon: Jonathon Bellows, MD;  Location[facility] ENDOSCOPY;  Service: Gastroenterology;  Laterality: N/A;   VENTRAL HERNIA REPAIR N/A 09/16/2019   Procedure: HERNIA REPAIR VENTRAL ADULT, Open;  Surgeon: Fredirick Maudlin, MD;  Location: [facility] ORS;  Service: General;  Laterality: N/A;   WISDOM TOOTH EXTRACTION      FAMILY HISTORY: Family History  Problem Relation Age of Onset   Stroke Mother    Depression Mother    Arthritis Mother    Heart disease Father    Alcohol abuse Father  Diabetes Father    Osteoporosis Sister    Kidney disease Sister    Arthritis Sister    Dementia Sister    Arthritis Maternal Grandmother    Heart disease Maternal Grandmother    Hyperlipidemia Maternal Grandmother    Diabetes Paternal Grandmother    Breast cancer Neg Hx     SOCIAL HISTORY: Social History   Socioeconomic History   Marital status: Divorced    Spouse name: Legrand Como   Number of children: 0   Years of education: 12   Highest education level: Not on file  Occupational History    Comment: disabled  Tobacco Use   Smoking status: Every Day    Packs/day: 1.00    Years: 45.00    Pack years: 45.00    Types: Cigarettes   Smokeless tobacco: Never   Tobacco comments:    reports quitting in feb 2021  Vaping Use   Vaping Use: Never used  Substance and Sexual Activity   Alcohol use: No   Drug use: Yes    Types: Marijuana   Sexual activity: Never  Other Topics Concern   Not on file  Social History Narrative   Disabled    Married no kids    No guns, wears seat belt, feels safe in relationship    12th grade ed.    Social Determinants of Health   Financial Resource Strain: Not on file  Food Insecurity:  Not on file  Transportation Needs: Not on file  Physical Activity: Not on file  Stress: Not on file  Social Connections: Not on file  Intimate Partner Violence: Not on file      PHYSICAL EXAM  Vitals:   09/19/20 0844  BP: 132/77  Pulse: 66  Weight: 139 lb 8 oz (63.3 kg)  Height: '5\' 5"'$  (1.651 m)    Body mass index is 23.21 kg/m.  Generalized: Well developed, in no acute distress  Cardiology: normal rate and rhythm, no murmur noted Respiratory: clear to auscultation bilaterally  Neurological examination  Mentation: Alert oriented to time, place, history taking. Follows all commands speech and language fluent Cranial nerve II-XII: Pupils were equal round reactive to light. Extraocular movements were full, visual field were full  Motor: The motor testing reveals 5 over 5 strength of all 4 extremities. Good symmetric motor tone is noted throughout.  Gait and station: Gait is arthritic, walks with walker  DIAGNOSTIC DATA (LABS, IMAGING, TESTING) - I reviewed patient records, labs, notes, testing and imaging myself where available.  No flowsheet data found.   Lab Results  Component Value Date   WBC 6.7 08/30/2020   HGB 12.7 08/30/2020   HCT 38.1 08/30/2020   MCV 98 (H) 08/30/2020   PLT 223 08/30/2020      Component Value Date/Time   NA 141 08/30/2020 1024   NA 139 08/28/2013 1805   K 3.9 08/30/2020 1024   K 3.5 08/28/2013 1805   CL 100 08/30/2020 1024   CL 101 08/28/2013 1805   CO2 25 08/30/2020 1024   CO2 34 (H) 08/28/2013 1805   GLUCOSE 84 08/30/2020 1024   GLUCOSE 82 05/03/2019 0630   GLUCOSE 91 08/28/2013 1805   BUN 18 08/30/2020 1024   BUN 13 08/28/2013 1805   CREATININE 0.87 08/30/2020 1024   CREATININE 1.27 08/28/2013 1805   CALCIUM 8.9 08/30/2020 1024   CALCIUM 8.8 08/28/2013 1805   PROT 6.9 08/30/2020 1024   PROT 7.8 08/28/2013 1805   ALBUMIN 4.3 08/30/2020 1024  ALBUMIN 4.2 08/28/2013 1805   AST 11 08/30/2020 1024   AST 23 08/28/2013 1805    ALT 7 08/30/2020 1024   ALT 16 08/28/2013 1805   ALKPHOS 76 08/30/2020 1024   ALKPHOS 64 08/28/2013 1805   BILITOT 0.2 08/30/2020 1024   BILITOT 0.3 08/28/2013 1805   GFRNONAA 72 06/28/2019 1312   GFRNONAA 47 (L) 08/28/2013 1805   GFRAA 83 06/28/2019 1312   GFRAA 54 (L) 08/28/2013 1805   Lab Results  Component Value Date   CHOL 183 08/30/2020   HDL 76 08/30/2020   LDLCALC 90 08/30/2020   TRIG 94 08/30/2020   CHOLHDL 2.1 06/12/2018   Lab Results  Component Value Date   HGBA1C 5.1 11/11/2019   Lab Results  Component Value Date   VITAMINB12 199 (L) 08/30/2020   Lab Results  Component Value Date   TSH 1.180 08/30/2020       ASSESSMENT AND PLAN 65 y.o. year old female  has a past medical history of Allergy, Anxiety, Arthritis, Chicken pox, COPD (chronic obstructive pulmonary disease) ([place]), Depression, Family history of adverse reaction to anesthesia, GERD (gastroesophageal reflux disease), Headache, History of blood transfusion, Hyperlipidemia, Hypertension, Migraine, PONV (postoperative nausea and vomiting), and Seizure ([place]). here with     ICD-10-CM   1. Seizures ([place])  R56.9     2. Migraine without aura and without status migrainosus, not intractable  G43.009        Is doing very well, today.  She has tolerated lamotrigine 100 mg twice daily.  We will continue current treatment plan.  She will continue to use rizatriptan as needed for migraine abortion. She will monitor for worsening tremor. Mood is stable.  She is now a non-smoker.  I have commended her on smoking cessation and continuation of healthy lifestyle habits.  She will continue close follow-up with primary care.  She will follow-up with me in 1 year, sooner if needed.  She verbalizes understanding and agreement with this plan.   No orders of the defined types were placed in this encounter.    Meds ordered this encounter  Medications   lamoTRIgine (LAMICTAL) 100 MG tablet    Sig: Take 1 tablet (100 mg  total) by mouth 2 (two) times daily.    Dispense:  180 tablet    Refill:  3    New tablet strength    Order Specific Question:   Supervising Provider    Answer:   Melvenia Beam [id number]   rizatriptan (MAXALT-MLT) 10 MG disintegrating tablet    Sig: Take 1 tablet (10 mg total) by mouth as needed for migraine. May repeat in 2 hours if needed    Dispense:  9 tablet    Refill:  11    Order Specific Question:   Supervising Provider    Answer:   Melvenia Beam [id number], FNP-C 09/19/2020, 9:14 AM [facility] [address] [place], [place] [address] [phone]

## 2020-09-19 ENCOUNTER — Ambulatory Visit: Payer: Medicare Other | Admitting: Family Medicine

## 2020-09-19 ENCOUNTER — Encounter: Payer: Self-pay | Admitting: Family Medicine

## 2020-09-19 VITALS — BP 132/77 | HR 66 | Ht 65.0 in | Wt 139.5 lb

## 2020-09-19 DIAGNOSIS — G43009 Migraine without aura, not intractable, without status migrainosus: Secondary | ICD-10-CM

## 2020-09-19 DIAGNOSIS — R569 Unspecified convulsions: Secondary | ICD-10-CM

## 2020-09-19 MED ORDER — LAMOTRIGINE 100 MG PO TABS: 100.0000 mg | ORAL_TABLET | Freq: Two times a day (BID) | ORAL | 3 refills | Status: DC

## 2020-09-19 MED ORDER — RIZATRIPTAN BENZOATE 10 MG PO TBDP: 10.0000 mg | ORAL_TABLET | ORAL | 11 refills | Status: DC | PRN

## 2020-09-19 NOTE — Patient Instructions (Signed)
Below is our plan:  We will continue lamotrigine '100mg'$  twice daily and rizatriptan as needed   Please make sure you are staying well hydrated. I recommend 50-60 ounces daily. Well balanced diet and regular exercise encouraged. Consistent sleep schedule with 6-8 hours recommended.   Please continue follow up with care team as directed.   Follow up with me in 1 year   You may receive a survey regarding today's visit. I encourage you to leave honest feed back as I do use this information to improve patient care. Thank you for seeing me today!

## 2020-09-20 ENCOUNTER — Other Ambulatory Visit: Payer: Self-pay | Admitting: Nurse Practitioner

## 2020-09-20 DIAGNOSIS — K219 Gastro-esophageal reflux disease without esophagitis: Secondary | ICD-10-CM

## 2020-09-20 NOTE — Telephone Encounter (Signed)
Patient last seen 08/30/20 and has appointment 11/30/20

## 2020-09-20 NOTE — Telephone Encounter (Signed)
Requested medication (s) are due for refill today - yes  Requested medication (s) are on the active medication list -yes  Future visit scheduled -yes  Last refill: 08/17/20  Notes to clinic: Rx refill request- outside provider  Requested Prescriptions  Pending Prescriptions Disp Refills   famotidine (PEPCID) 20 MG tablet [Pharmacy Med Name: FAMOTIDINE 20 MG TAB] 30 tablet 0    Sig: TAKE 1/2 TABLET (10 MG TOTAL) BY MOUTH 2TIMES DAILY     Gastroenterology:  H2 Antagonists Passed - 09/20/2020 11:46 AM      Passed - Valid encounter within last 12 months    Recent Outpatient Visits           1 week ago Urinary frequency   [place], Lauren A, NP   3 weeks ago Osteoarthritis of right hip, unspecified osteoarthritis type   [facility] [place], Lauren A, NP   3 months ago COPD (chronic obstructive pulmonary disease) with chronic bronchitis ([place])   [facility], [place] W, NP   4 months ago Avascular necrosis of right femoral head [facility])   [place], DO   7 months ago Annual physical exam   [facility], Lupita Raider, FNP       Future Appointments             In 2 months Mc[place]wee, Scheryl Darter, NP [place], [facility]               Requested Prescriptions  Pending Prescriptions Disp Refills   famotidine (PEPCID) 20 MG tablet [Pharmacy Med Name: FAMOTIDINE 20 MG TAB] 30 tablet 0    Sig: TAKE 1/2 TABLET (10 MG TOTAL) BY MOUTH 2TIMES DAILY     Gastroenterology:  H2 Antagonists Passed - 09/20/2020 11:46 AM      Passed - Valid encounter within last 12 months    Recent Outpatient Visits           1 week ago Urinary frequency   [place], Lauren A, NP   3 weeks ago Osteoarthritis of right hip, unspecified osteoarthritis type   [facility] McElwee, Lauren A, NP   3 months ago COPD (chronic obstructive pulmonary disease)  with chronic bronchitis ([place])   [facility] [place], [place] W, NP   4 months ago Avascular necrosis of right femoral head [facility])   [facility] Olin Hauser, DO   7 months ago Annual physical exam   [facility], Lupita Raider, FNP       Future Appointments             In 2 months [place], Scheryl Darter, NP [facility], [facility]

## 2020-10-23 ENCOUNTER — Other Ambulatory Visit: Payer: Self-pay | Admitting: Nurse Practitioner

## 2020-10-23 DIAGNOSIS — K219 Gastro-esophageal reflux disease without esophagitis: Secondary | ICD-10-CM

## 2020-10-29 ENCOUNTER — Telehealth: Payer: Self-pay

## 2020-10-29 NOTE — Telephone Encounter (Signed)
She needs to reach out to her orthopedist. I know she is waiting on hip surgery, but I can refer her to pain medicine if she would like in the meantime.

## 2020-10-29 NOTE — Telephone Encounter (Signed)
Patient says she was advised to follow up with pcp regarding her issue.  Offered appointment but patient declined.

## 2020-10-29 NOTE — Telephone Encounter (Signed)
Copied from [place] [phone]. Topic: General - Other >> Oct 29, 2020 12:01 PM Kayla Shah A wrote: Reason for CRM: The patient would like to be prescribed an alternative pain medication for their hip discomfort   The patient shares that the gabapentin they've previously taken is ineffective  Please contact further when possible   Routing to provider to advise.

## 2020-10-30 ENCOUNTER — Other Ambulatory Visit: Payer: Self-pay

## 2020-10-30 ENCOUNTER — Encounter: Payer: Self-pay | Admitting: Nurse Practitioner

## 2020-10-30 ENCOUNTER — Ambulatory Visit (INDEPENDENT_AMBULATORY_CARE_PROVIDER_SITE_OTHER): Payer: Medicare Other | Admitting: Nurse Practitioner

## 2020-10-30 ENCOUNTER — Telehealth: Payer: Self-pay

## 2020-10-30 VITALS — BP 119/79 | HR 76 | Temp 98.6°F | Wt 140.0 lb

## 2020-10-30 DIAGNOSIS — M1611 Unilateral primary osteoarthritis, right hip: Secondary | ICD-10-CM

## 2020-10-30 DIAGNOSIS — M87051 Idiopathic aseptic necrosis of right femur: Secondary | ICD-10-CM | POA: Diagnosis not present

## 2020-10-30 DIAGNOSIS — I10 Essential (primary) hypertension: Secondary | ICD-10-CM | POA: Diagnosis not present

## 2020-10-30 MED ORDER — VERAPAMIL HCL ER 240 MG PO TBCR: 240.0000 mg | EXTENDED_RELEASE_TABLET | Freq: Every day | ORAL | 1 refills | Status: DC

## 2020-10-30 MED ORDER — OXYCODONE-ACETAMINOPHEN 5-325 MG PO TABS: 1.0000 | ORAL_TABLET | ORAL | 0 refills | Status: AC | PRN

## 2020-10-30 NOTE — Telephone Encounter (Signed)
Copied from [place] [phone]. Topic: General - Other >> Oct 30, 2020 10:23 AM Celene Kras wrote: Reason for CRM: Pt called stating that she was advised by PCP that she did not need to keep her appt on 11/30/20. She states that she is concerned about receiving her tetanus shot. Please advise.   Routing to provider. Can patient come in for a nurse visit or should she wait until December office visit? Patient does have medicare so we would have to use a specific diagnosis.

## 2020-10-30 NOTE — Progress Notes (Signed)
Established Patient Office Visit  Subjective:  Patient ID: Kayla Shah, female    DOB: [birth year]  Age: 65 y.o. MRN: [id number]  CC:  Chief Complaint  Patient presents with   Right Hip Pain    Pt very tearful, in a lot of pain with the right pain. Pain ongoing for over a year. Pain is also in lower back and radiates to foot. Knee is also hurting. Pain is throbbing, sharp and achy.     HPI Kayla Shah presents for chronic right hip pain. She has been seeing orthopedics at [facility] and she is unsure why it is taking so long to get an appointment and schedule surgery for her hip. She has an appointment with a orthopedic surgeon PA in November, but that is only the initial evaluation and would need to see the surgeon after this. She is getting frustrated as she is in severe pain and is now having trouble walking. She has gone from using a cane, to a walker, and now this visit she is in a wheelchair. She states that orthopedics told her to reach out to her primary care provider to help with pain management.   Past Medical History:  Diagnosis Date   Allergy    Anxiety    agoraphobia    Arthritis    Chicken pox    COPD (chronic obstructive pulmonary disease) (HCC)    Depression    Family history of adverse reaction to anesthesia    PONV   GERD (gastroesophageal reflux disease)    Headache    History of blood transfusion    1984   Hyperlipidemia    Hypertension    Migraine    PONV (postoperative nausea and vomiting)    Seizure ([place])    last seizure 04/2019    Past Surgical History:  Procedure Laterality Date   APPENDECTOMY  1984   CATARACT EXTRACTION, BILATERAL Bilateral    COLONOSCOPY WITH PROPOFOL N/A 03/26/2017   Procedure: COLONOSCOPY WITH PROPOFOL;  Surgeon: Jonathon Bellows, MD;  Location: [facility] ENDOSCOPY;  Service: Gastroenterology;  Laterality: N/A;   TOTAL ABDOMINAL HYSTERECTOMY  1984   Total   VENTRAL HERNIA REPAIR N/A 09/16/2019   Procedure: HERNIA REPAIR VENTRAL  ADULT, Open;  Surgeon: Fredirick Maudlin, MD;  Location: [facility] ORS;  Service: General;  Laterality: N/A;   WISDOM TOOTH EXTRACTION      Family History  Problem Relation Age of Onset   Stroke Mother    Depression Mother    Arthritis Mother    Heart disease Father    Alcohol abuse Father    Diabetes Father    Osteoporosis Sister    Kidney disease Sister    Arthritis Sister    Dementia Sister    Arthritis Maternal Grandmother    Heart disease Maternal Grandmother    Hyperlipidemia Maternal Grandmother    Diabetes Paternal Grandmother    Breast cancer Neg Hx     Social History   Socioeconomic History   Marital status: Divorced    Spouse name: Legrand Como   Number of children: 0   Years of education: 12   Highest education level: Not on file  Occupational History    Comment: disabled  Tobacco Use   Smoking status: Every Day    Packs/day: 1.00    Years: 45.00    Pack years: 45.00    Types: Cigarettes   Smokeless tobacco: Never   Tobacco comments:    reports quitting in feb 2021  Vaping Use  Vaping Use: Never used  Substance and Sexual Activity   Alcohol use: No   Drug use: Yes    Types: Marijuana   Sexual activity: Never  Other Topics Concern   Not on file  Social History Narrative   Disabled    Married no kids    No guns, wears seat belt, feels safe in relationship    12th grade ed.    Social Determinants of Health   Financial Resource Strain: Not on file  Food Insecurity: Not on file  Transportation Needs: Not on file  Physical Activity: Not on file  Stress: Not on file  Social Connections: Not on file  Intimate Partner Violence: Not on file    Outpatient Medications Prior to Visit  Medication Sig Dispense Refill   diazepam (VALIUM) 5 MG tablet Take 5 mg by mouth 3 (three) times daily as needed.     famotidine (PEPCID) 20 MG tablet TAKE 1/2 TABLET (10 MG TOTAL) BY MOUTH 2TIMES DAILY 90 tablet 0   lamoTRIgine (LAMICTAL) 100 MG tablet Take 1 tablet (100 mg  total) by mouth 2 (two) times daily. 180 tablet 3   pantoprazole (PROTONIX) 40 MG tablet Take 1 tablet (40 mg total) by mouth daily. 90 tablet 0   [name] 1.25 MG tablet TAKE 1 TABLET BY MOUTH DAILY 90 tablet 3   rizatriptan (MAXALT-MLT) 10 MG disintegrating tablet Take 1 tablet (10 mg total) by mouth as needed for migraine. May repeat in 2 hours if needed 9 tablet 11   simvastatin (ZOCOR) 20 MG tablet TAKE 1 TABLET BY MOUTH DAILY AT 6 PM 90 tablet 1   verapamil (CALAN-SR) 240 MG CR tablet Take 1 tablet (240 mg total) by mouth daily. 90 tablet 1   No facility-administered medications prior to visit.    Allergies  Allergen Reactions   Codeine Nausea And Vomiting   Fluoxetine Anxiety   Paroxetine Anxiety    Becomes very aggressive with mood swings.    ROS Review of Systems  Constitutional:  Positive for fatigue.  Respiratory: Negative.    Cardiovascular: Negative.   Gastrointestinal: Negative.   Musculoskeletal:  Positive for arthralgias (right hip pain).  Neurological: Negative.      Objective:    Physical Exam Vitals and nursing note reviewed.  Constitutional:      General: She is not in acute distress.    Appearance: Normal appearance.  HENT:     Head: Normocephalic and atraumatic.  Eyes:     Conjunctiva/sclera: Conjunctivae normal.  Cardiovascular:     Rate and Rhythm: Normal rate and regular rhythm.     Pulses: Normal pulses.     Heart sounds: Normal heart sounds.  Pulmonary:     Effort: Pulmonary effort is normal.     Breath sounds: Normal breath sounds.  Musculoskeletal:     Cervical back: Normal range of motion.     Comments: Pain to right hip with any touching. Limiting ROM and unable to ambulate due to pain.   Skin:    General: Skin is warm and dry.  Neurological:     General: No focal deficit present.     Mental Status: She is alert and oriented to person, place, and time.  Psychiatric:        Mood and Affect: Mood normal.        Behavior: Behavior  normal.        Thought Content: Thought content normal.        Judgment: Judgment normal.  BP 119/79   Pulse 76   Temp 98.6 F (37 C)   Wt 140 lb (63.5 kg) Comment: pt couldn't stand  SpO2 98%   BMI 23.30 kg/m  Wt Readings from Last 3 Encounters:  10/30/20 140 lb (63.5 kg)  09/19/20 139 lb 8 oz (63.3 kg)  08/30/20 132 lb 3.2 oz (60 kg)     Health Maintenance Due  Topic Date Due   TETANUS/TDAP  Never done    There are no preventive care reminders to display for this patient.  Lab Results  Component Value Date   TSH 1.180 08/30/2020   Lab Results  Component Value Date   WBC 6.7 08/30/2020   HGB 12.7 08/30/2020   HCT 38.1 08/30/2020   MCV 98 (H) 08/30/2020   PLT 223 08/30/2020   Lab Results  Component Value Date   NA 141 08/30/2020   K 3.9 08/30/2020   CO2 25 08/30/2020   GLUCOSE 84 08/30/2020   BUN 18 08/30/2020   CREATININE 0.87 08/30/2020   BILITOT 0.2 08/30/2020   ALKPHOS 76 08/30/2020   AST 11 08/30/2020   ALT 7 08/30/2020   PROT 6.9 08/30/2020   ALBUMIN 4.3 08/30/2020   CALCIUM 8.9 08/30/2020   ANIONGAP 6 05/03/2019   EGFR 74 08/30/2020   GFR 59.88 (L) 08/05/2017   Lab Results  Component Value Date   CHOL 183 08/30/2020   Lab Results  Component Value Date   HDL 76 08/30/2020   Lab Results  Component Value Date   LDLCALC 90 08/30/2020   Lab Results  Component Value Date   TRIG 94 08/30/2020   Lab Results  Component Value Date   CHOLHDL 2.1 06/12/2018   Lab Results  Component Value Date   HGBA1C 5.1 11/11/2019      Assessment & Plan:   Problem List Items Addressed This Visit       Cardiovascular and Mediastinum   Essential (primary) hypertension    Chronic, stable. Well controlled on verapamil. Labs reviewed from July 2022. Refill sent to pharmacy. Follow up in 6 months.       Relevant Medications   verapamil (CALAN-SR) 240 MG CR tablet     Musculoskeletal and Integument   Osteoarthritis of right hip - Primary     Chronic, ongoing severe pain. Will give her 5 days of oxycodone/tylenol. Discussed not to drive while taking this medicine and to not take it together with her valium. Will place new referral to orthopedic surgeon in [place] for a second opinion. PDMP reviewed. Will await recommendations from orthopedic surgery.        Relevant Medications   oxyCODONE-acetaminophen (PERCOCET/ROXICET) 5-325 MG tablet   Other Relevant Orders   Ambulatory referral to Orthopedic Surgery   Avascular necrosis of right femoral head (HCC)    Chronic, ongoing severe pain. Will give her 5 days of oxycodone/tylenol. Discussed not to drive while taking this medicine and to not take it together with her valium. Will place new referral to orthopedic surgeon in [place] for a second opinion. PDMP reviewed. Will await recommendations from orthopedic surgery.        Relevant Medications   oxyCODONE-acetaminophen (PERCOCET/ROXICET) 5-325 MG tablet    Meds ordered this encounter  Medications   verapamil (CALAN-SR) 240 MG CR tablet    Sig: Take 1 tablet (240 mg total) by mouth daily.    Dispense:  90 tablet    Refill:  1   oxyCODONE-acetaminophen (PERCOCET/ROXICET) 5-325 MG tablet  Sig: Take 1 tablet by mouth every 4 (four) hours as needed for up to 5 days for severe pain.    Dispense:  30 tablet    Refill:  0    Follow-up: Return in about 3 months (around 01/29/2021) for can cancel October appointment .    Charyl Dancer, NP

## 2020-10-30 NOTE — Assessment & Plan Note (Signed)
Chronic, ongoing severe pain. Will give her 5 days of oxycodone/tylenol. Discussed not to drive while taking this medicine and to not take it together with her valium. Will place new referral to orthopedic surgeon in [place] for a second opinion. PDMP reviewed. Will await recommendations from orthopedic surgery.

## 2020-10-30 NOTE — Assessment & Plan Note (Signed)
Chronic, stable. Well controlled on verapamil. Labs reviewed from July 2022. Refill sent to pharmacy. Follow up in 6 months.

## 2020-10-30 NOTE — Assessment & Plan Note (Signed)
Chronic, ongoing severe pain. Will give her 5 days of oxycodone/tylenol. Discussed not to drive while taking this medicine and to not take it together with her valium. Will place new referral to orthopedic surgeon in [place] for a second opinion. PDMP reviewed. Will await recommendations from orthopedic surgery.

## 2020-10-30 NOTE — Telephone Encounter (Signed)
She can come in for a nurse visit. She didn't want to do it today.

## 2020-10-31 ENCOUNTER — Telehealth: Payer: Self-pay

## 2020-10-31 NOTE — Telephone Encounter (Signed)
Great! I am glad she is able to get in next week!

## 2020-10-31 NOTE — Telephone Encounter (Signed)
Copied from [place] [phone]. Topic: General - Other >> Oct 31, 2020  2:07 PM Leward Quan A wrote: Reason for CRM: Patient called in to inform Vance Peper that she have heard from the Orthopedic Dr in [place] wanted to extend her deepest thanks from the bottom of her heart. Say that she is very thankful and grateful

## 2020-11-01 NOTE — Telephone Encounter (Signed)
Pt scheduled for tetanus shot on 11/19/20

## 2020-11-06 ENCOUNTER — Other Ambulatory Visit: Payer: Self-pay

## 2020-11-06 ENCOUNTER — Ambulatory Visit: Payer: Self-pay

## 2020-11-06 ENCOUNTER — Ambulatory Visit: Payer: Medicare Other | Admitting: Orthopaedic Surgery

## 2020-11-06 DIAGNOSIS — M87051 Idiopathic aseptic necrosis of right femur: Secondary | ICD-10-CM | POA: Diagnosis not present

## 2020-11-06 NOTE — Progress Notes (Signed)
Office Visit Note   Patient: Kayla Shah [facility]           Date of Birth: [birth year]           MRN: [id number] Visit Date: 11/06/2020              Requested by: Charyl Dancer, NP [address] [place],  [place] [address] PCP: Charyl Dancer, NP   Assessment & Plan: Visit Diagnoses:  1. Avascular necrosis of bone of right hip ([place])     Plan: Based on findings Amyra has fairly severe avascular necrosis and the x-rays have shown significant progression and worsening with femoral head collapse and fragmentation and secondary DJD.  Given these findings I have recommended a right total hip replacement for pain relief and early mobilization to improve quality of life and ambulation.  Risk benefits rehab recovery reviewed with the patient in detail.  I strongly encouraged that she cut back on smoking to mitigate surgical risks related to tobacco use.  She is also fairly anxious and we discussed that this may sometimes complicate recovery.  I also feel that she has underlying chronic pain syndrome which may be exacerbated by the surgery for the first several weeks or so.  She met with Jackelyn Poling today to schedule surgery for ASAP.  Follow-Up Instructions: No follow-ups on file.   Orders:  Orders Placed This Encounter  Procedures   XR HIP UNILAT W OR W/O PELVIS 2-3 VIEWS RIGHT   No orders of the defined types were placed in this encounter.     Procedures: No procedures performed   Clinical Data: No additional findings.   Subjective: Chief Complaint  Patient presents with   Right Hip - Pain    HPI  Kayla Shah is a 65 year old female here for second pinon.  Husband Legrand Como is here as well.  She has had severe and chronic right hip and groin pain since in last November which is gotten much worse over the last 6 months.  Denies any injuries.  She is now walking with a walker due to the severe pain.  She is currently unemployed.  She has had several consultations with orthopedist in  the past and she has not enjoyed the encounters.  She takes oxycodone for chronic low back pain.  She does have baseline anxiety for which she is on Xanax.  Review of Systems  Constitutional: Negative.   HENT: Negative.    Eyes: Negative.   Respiratory: Negative.    Cardiovascular: Negative.   Endocrine: Negative.   Musculoskeletal: Negative.   Neurological: Negative.   Hematological: Negative.   Psychiatric/Behavioral: Negative.    All other systems reviewed and are negative.   Objective: Vital Signs: There were no vitals taken for this visit.  Physical Exam Vitals and nursing note reviewed.  Constitutional:      Appearance: She is well-developed.  HENT:     Head: Normocephalic and atraumatic.  Pulmonary:     Effort: Pulmonary effort is normal.  Abdominal:     Palpations: Abdomen is soft.  Musculoskeletal:     Cervical back: Neck supple.  Skin:    General: Skin is warm.     Capillary Refill: Capillary refill takes less than 2 seconds.  Neurological:     Mental Status: She is alert and oriented to person, place, and time.  Psychiatric:        Behavior: Behavior normal.        Thought Content: Thought content normal.  Judgment: Judgment normal.    Ortho Exam  Right hip exam shows very limited range of motion secondary to pain and guarding.  Unable to perform Stinchfield test secondary to pain and guarding.  She is quite anxious to any palpation around the hip or attempted range of motion.  Specialty Comments:  No specialty comments available.  Imaging: XR HIP UNILAT W OR W/O PELVIS 2-3 VIEWS RIGHT  Result Date: 11/06/2020 X-rays demonstrate interval collapse and fragmentation of the right femoral head with secondary degenerative joint disease.  There has been significant progression of the avascular necrosis of the femoral head.    PMFS History: Patient Active Problem List   Diagnosis Date Noted   Avascular necrosis of right femoral head ([place]) 10/30/2020    Urinary frequency 09/13/2020   Cigarette nicotine dependence without complication 11/30/2534   Osteoarthritis of right hip 08/30/2020   Chronic HFrEF (heart failure with reduced ejection fraction) ([place]) 08/19/2020   Aortic atherosclerosis ([place]) 05/01/2020   Chronic bilateral low back pain without sciatica 02/17/2020   Intractable nausea and vomiting 05/01/2019   Anxiety and depression 07/30/2017   CAD (coronary artery disease) 07/30/2017   Hot flashes due to surgical menopause 07/30/2017   Insomnia 07/30/2017   Osteoporosis 02/27/2016   Migraine 06/07/2015   Seizures ([place]) 06/07/2015   Gastroesophageal reflux disease without esophagitis 06/07/2015   COPD (chronic obstructive pulmonary disease) with chronic bronchitis ([place]) 08/14/2014   Essential (primary) hypertension 08/14/2014   Pure hypercholesterolemia 08/14/2014   Past Medical History:  Diagnosis Date   Allergy    Anxiety    agoraphobia    Arthritis    Chicken pox    COPD (chronic obstructive pulmonary disease) ([place])    Depression    Family history of adverse reaction to anesthesia    PONV   GERD (gastroesophageal reflux disease)    Headache    History of blood transfusion    1984   Hyperlipidemia    Hypertension    Migraine    PONV (postoperative nausea and vomiting)    Seizure ([place])    last seizure 04/2019    Family History  Problem Relation Age of Onset   Stroke Mother    Depression Mother    Arthritis Mother    Heart disease Father    Alcohol abuse Father    Diabetes Father    Osteoporosis Sister    Kidney disease Sister    Arthritis Sister    Dementia Sister    Arthritis Maternal Grandmother    Heart disease Maternal Grandmother    Hyperlipidemia Maternal Grandmother    Diabetes Paternal Grandmother    Breast cancer Neg Hx     Past Surgical History:  Procedure Laterality Date   APPENDECTOMY  1984   CATARACT EXTRACTION, BILATERAL Bilateral    COLONOSCOPY WITH PROPOFOL N/A 03/26/2017    Procedure: COLONOSCOPY WITH PROPOFOL;  Surgeon: Jonathon Bellows, MD;  Location: [facility] ENDOSCOPY;  Service: Gastroenterology;  Laterality: N/A;   TOTAL ABDOMINAL HYSTERECTOMY  1984   Total   VENTRAL HERNIA REPAIR N/A 09/16/2019   Procedure: HERNIA REPAIR VENTRAL ADULT, Open;  Surgeon: Fredirick Maudlin, MD;  Location: [facility] ORS;  Service: General;  Laterality: N/A;   WISDOM TOOTH EXTRACTION     Social History   Occupational History    Comment: disabled  Tobacco Use   Smoking status: Every Day    Packs/day: 1.00    Years: 45.00    Pack years: 45.00    Types: Cigarettes   Smokeless  tobacco: Never   Tobacco comments:    reports quitting in feb 2021  Vaping Use   Vaping Use: Never used  Substance and Sexual Activity   Alcohol use: No   Drug use: Yes    Types: Marijuana   Sexual activity: Never

## 2020-11-08 ENCOUNTER — Other Ambulatory Visit: Payer: Self-pay | Admitting: Nurse Practitioner

## 2020-11-08 DIAGNOSIS — M87051 Idiopathic aseptic necrosis of right femur: Secondary | ICD-10-CM

## 2020-11-08 NOTE — Telephone Encounter (Signed)
Requested medication (s) are due for refill today - no- Rx for acute problem  Requested medication (s) are on the active medication list -no  Future visit scheduled -yes  Last refill: 10/30/20 #30  Notes to clinic: Request RF: non delegated Rx  Requested Prescriptions  Pending Prescriptions Disp Refills   oxyCODONE-acetaminophen (PERCOCET/ROXICET) 5-325 MG tablet [Pharmacy Med Name: OXYCODONE-APAP 5-325 MG TAB] 30 tablet     Sig: TAKE 1 TABLET BY MOUTH EVERY 4 HOURS AS NEEDED FOR SEVERE PAIN (FOR UP TO 5 DAYS)     Not Delegated - Analgesics:  Opioid Agonist Combinations Failed - 11/08/2020  9:17 AM      Failed - This refill cannot be delegated      Failed - Urine Drug Screen completed in last 360 days      Passed - Valid encounter within last 6 months    Recent Outpatient Visits           1 week ago Osteoarthritis of right hip, unspecified osteoarthritis type   [facility] McElwee, Lauren A, NP   1 month ago Urinary frequency   [place] M[place], Lauren A, NP   2 months ago Osteoarthritis of right hip, unspecified osteoarthritis type   [facility] McElwee, Lauren A, NP   5 months ago COPD (chronic obstructive pulmonary disease) with chronic bronchitis ([place])   [facility] [place], Coralie Keens, NP   6 months ago Avascular necrosis of right femoral head ([place])   [place], DO       Future Appointments             In 2 months McElwee, Scheryl Darter, NP [place], [place]               Requested Prescriptions  Pending Prescriptions Disp Refills   oxyCODONE-acetaminophen (PERCOCET/ROXICET) 5-325 MG tablet [Pharmacy Med Name: SAYTKZSWF-UXNA 3-557 MG TAB] 30 tablet     Sig: TAKE 1 TABLET BY MOUTH EVERY 4 HOURS AS NEEDED FOR SEVERE PAIN (FOR UP TO 5 DAYS)     Not Delegated - Analgesics:  Opioid Agonist Combinations Failed - 11/08/2020  9:17 AM      Failed - This refill  cannot be delegated      Failed - Urine Drug Screen completed in last 360 days      Passed - Valid encounter within last 6 months    Recent Outpatient Visits           1 week ago Osteoarthritis of right hip, unspecified osteoarthritis type   [facility] McElwee, Lauren A, NP   1 month ago Urinary frequency   [place], Lauren A, NP   2 months ago Osteoarthritis of right hip, unspecified osteoarthritis type   [facility] McElwee, Lauren A, NP   5 months ago COPD (chronic obstructive pulmonary disease) with chronic bronchitis ([place])   [facility] [place], Coralie Keens, NP   6 months ago Avascular necrosis of right femoral head [facility])   [facility] Olin Hauser, DO       Future Appointments             In 2 months McElwee, Scheryl Darter, NP [place], [facility]

## 2020-11-09 ENCOUNTER — Telehealth: Payer: Self-pay | Admitting: Orthopaedic Surgery

## 2020-11-09 ENCOUNTER — Other Ambulatory Visit: Payer: Self-pay

## 2020-11-09 NOTE — Telephone Encounter (Signed)
Pt called requesting pain medication until her surgery date at the end of the month. Please send to [facility]. Please call pt when medication has been called in at [phone].

## 2020-11-12 ENCOUNTER — Other Ambulatory Visit: Payer: Self-pay | Admitting: Physician Assistant

## 2020-11-12 MED ORDER — METHOCARBAMOL 500 MG PO TABS
500.0000 mg | ORAL_TABLET | Freq: Two times a day (BID) | ORAL | 2 refills | Status: DC | PRN
Start: 2020-11-12 — End: 2021-02-01

## 2020-11-12 MED ORDER — OXYCODONE-ACETAMINOPHEN 5-325 MG PO TABS: 1.0000 | ORAL_TABLET | Freq: Four times a day (QID) | ORAL | 0 refills | Status: DC | PRN

## 2020-11-12 MED ORDER — DOCUSATE SODIUM 100 MG PO CAPS: 100.0000 mg | ORAL_CAPSULE | Freq: Every day | ORAL | 2 refills | Status: DC | PRN

## 2020-11-12 MED ORDER — ASPIRIN EC 81 MG PO TBEC: 81.0000 mg | DELAYED_RELEASE_TABLET | Freq: Two times a day (BID) | ORAL | 0 refills | Status: DC

## 2020-11-12 MED ORDER — ONDANSETRON HCL 4 MG PO TABS: 4.0000 mg | ORAL_TABLET | Freq: Three times a day (TID) | ORAL | 0 refills | Status: DC | PRN

## 2020-11-12 MED ORDER — TRAMADOL HCL 50 MG PO TABS: 50.0000 mg | ORAL_TABLET | Freq: Two times a day (BID) | ORAL | 2 refills | Status: DC | PRN

## 2020-11-12 NOTE — Telephone Encounter (Signed)
Sent in tramadol

## 2020-11-14 NOTE — Pre-Procedure Instructions (Signed)
Surgical Instructions   Your procedure is scheduled on Monday, October 17th. Report to [facility] Main Entrance "A" at 05:30 A.M., then check in with the Admitting office. Call this number if you have problems the morning of surgery: [phone]   If you have any questions prior to your surgery date call [phone]: Open Monday-Friday 8am-4pm   Remember: Do not eat after midnight the night before your surgery  You may drink clear liquids until 04:30 AM the morning of your surgery.   Clear liquids allowed are: Water, Non-Citrus Juices (without pulp), Carbonated Beverages, Clear Tea, Black Coffee Only, and Gatorade  Patient Instructions  The night before surgery:  No food after midnight. ONLY clear liquids after midnight  The day of surgery (if you do NOT have diabetes):  Drink ONE (1) Pre-Surgery Clear Ensure by 04:30 AM the morning of surgery. Drink in one sitting. Do not sip.  This drink was given to you during your hospital  pre-op appointment visit.  Nothing else to drink after completing the  Pre-Surgery Clear Ensure.  If you have questions, please contact your surgeon's office.       Take these medicines the morning of surgery with A SIP OF WATER  famotidine (PEPCID)  lamoTRIgine (LAMICTAL)  pantoprazole (PROTONIX) [name] verapamil (CALAN-SR)    If needed: diazepam (VALIUM) ondansetron (ZOFRAN) rizatriptan (MAXALT-MLT)  traMADol (ULTRAM)   As of today, STOP taking any Aspirin (unless otherwise instructed by your surgeon) Aleve, Naproxen, Ibuprofen, Motrin, Advil, Goody's, BC's, all herbal medications, fish oil, and all vitamins.                     Do NOT Smoke (Tobacco/Vaping) or drink Alcohol 24 hours prior to your procedure.  If you use a CPAP at night, you may bring all equipment for your overnight stay.   Contacts, glasses, piercing's, hearing aid's, dentures or partials may not be worn into surgery, please bring cases for these belongings.    For  patients admitted to the hospital, discharge time will be determined by your treatment team.   Patients discharged the day of surgery will not be allowed to drive home, and someone needs to stay with them for 24 hours.  NO VISITORS WILL BE ALLOWED IN PRE-OP WHERE PATIENTS GET READY FOR SURGERY.  ONLY 1 SUPPORT PERSON MAY BE PRESENT IN THE WAITING ROOM WHILE YOU ARE IN SURGERY.  IF YOU ARE TO BE ADMITTED, ONCE YOU ARE IN YOUR ROOM YOU WILL BE ALLOWED TWO (2) VISITORS.  Minor children may have two parents present. Special consideration for safety and communication needs will be reviewed on a case by case basis.   Special instructions:   [place]- Preparing For Surgery  Before surgery, you can play an important role. Because skin is not sterile, your skin needs to be as free of germs as possible. You can reduce the number of germs on your skin by washing with CHG (chlorahexidine gluconate) Soap before surgery.  CHG is an antiseptic cleaner which kills germs and bonds with the skin to continue killing germs even after washing.    Oral Hygiene is also important to reduce your risk of infection.  Remember - BRUSH YOUR TEETH THE MORNING OF SURGERY WITH YOUR REGULAR TOOTHPASTE  Please do not use if you have an allergy to CHG or antibacterial soaps. If your skin becomes reddened/irritated stop using the CHG.  Do not shave (including legs and underarms) for at least 48 hours prior to first  CHG shower. It is OK to shave your face.  Please follow these instructions carefully.   Shower the NIGHT BEFORE SURGERY and the MORNING OF SURGERY  If you chose to wash your hair, wash your hair first as usual with your normal shampoo.  After you shampoo, rinse your hair and body thoroughly to remove the shampoo.  Use CHG Soap as you would any other liquid soap. You can apply CHG directly to the skin and wash gently with a scrungie or a clean washcloth.   Apply the CHG Soap to your body ONLY FROM THE NECK DOWN.   Do not use on open wounds or open sores. Avoid contact with your eyes, ears, mouth and genitals (private parts). Wash Face and genitals (private parts)  with your normal soap.   Wash thoroughly, paying special attention to the area where your surgery will be performed.  Thoroughly rinse your body with warm water from the neck down.  DO NOT shower/wash with your normal soap after using and rinsing off the CHG Soap.  Pat yourself dry with a CLEAN TOWEL.  Wear CLEAN PAJAMAS to bed the night before surgery  Place CLEAN SHEETS on your bed the night before your surgery  DO NOT SLEEP WITH PETS.   Day of Surgery: Shower with CHG soap. Do not wear jewelry, make up, nail polish, gel polish, artificial nails, or any other type of covering on natural nails including finger and toenails. If patients have artificial nails, gel coating, etc. that need to be removed by a nail salon please have this removed prior to surgery. Surgery may need to be canceled/delayed if the surgeon/ anesthesia feels like the patient is unable to be adequately monitored. Do not wear lotions, powders, perfumes, or deodorant. Do not shave 48 hours prior to surgery.   Do not bring valuables to the hospital. [facility] is not responsible for any belongings or valuables. Wear Clean/Comfortable clothing the morning of surgery Remember to brush your teeth WITH YOUR REGULAR TOOTHPASTE.   Please read over the following fact sheets that you were given.   3 days prior to your procedure or After your COVID test   You are not required to quarantine however you are required to wear a well-fitting mask when you are out and around people not in your household. If your mask becomes wet or soiled, replace with a new one.   Wash your hands often with soap and water for 20 seconds or clean your hands with an alcohol-based hand sanitizer that contains at least 60% alcohol.   Do not share personal items.   Notify your  provider:  o if you are in close contact with someone who has COVID  o or if you develop a fever of 100.4 or greater, sneezing, cough, sore throat, shortness of breath or body aches.

## 2020-11-15 ENCOUNTER — Encounter (HOSPITAL_COMMUNITY)
Admission: RE | Admit: 2020-11-15 | Discharge: 2020-11-15 | Disposition: A | Discharge status: Home / Self Care | Payer: Medicare Other | Source: Ambulatory Visit | Attending: Orthopaedic Surgery | Admitting: Orthopaedic Surgery

## 2020-11-15 ENCOUNTER — Encounter (HOSPITAL_COMMUNITY): Payer: Self-pay

## 2020-11-15 ENCOUNTER — Other Ambulatory Visit: Payer: Self-pay

## 2020-11-15 DIAGNOSIS — Z20822 Contact with and (suspected) exposure to covid-19: Secondary | ICD-10-CM | POA: Insufficient documentation

## 2020-11-15 DIAGNOSIS — Z01818 Encounter for other preprocedural examination: Secondary | ICD-10-CM | POA: Diagnosis not present

## 2020-11-15 LAB — URINALYSIS, ROUTINE W REFLEX MICROSCOPIC
Bilirubin Urine: NEGATIVE
Glucose, UA: NEGATIVE mg/dL
Hgb urine dipstick: NEGATIVE
Ketones, ur: NEGATIVE mg/dL
Leukocytes,Ua: NEGATIVE
Nitrite: NEGATIVE
Protein, ur: NEGATIVE mg/dL
Specific Gravity, Urine: 1.013 (ref 1.005–1.030)
pH: 8 (ref 5.0–8.0)

## 2020-11-15 LAB — CBC WITH DIFFERENTIAL/PLATELET
Abs Immature Granulocytes: 0.03 10*3/uL (ref 0.00–0.07)
Basophils Absolute: 0 10*3/uL (ref 0.0–0.1)
Basophils Relative: 0 %
Eosinophils Absolute: 0.1 10*3/uL (ref 0.0–0.5)
Eosinophils Relative: 1 %
HCT: 38.6 % (ref 36.0–46.0)
Hemoglobin: 12.7 g/dL (ref 12.0–15.0)
Immature Granulocytes: 0 %
Lymphocytes Relative: 23 %
Lymphs Abs: 1.7 10*3/uL (ref 0.7–4.0)
MCH: 33.2 pg (ref 26.0–34.0)
MCHC: 32.9 g/dL (ref 30.0–36.0)
MCV: 100.8 fL — ABNORMAL HIGH (ref 80.0–100.0)
Monocytes Absolute: 0.4 10*3/uL (ref 0.1–1.0)
Monocytes Relative: 6 %
Neutro Abs: 5 10*3/uL (ref 1.7–7.7)
Neutrophils Relative %: 70 %
Platelets: 220 10*3/uL (ref 150–400)
RBC: 3.83 MIL/uL — ABNORMAL LOW (ref 3.87–5.11)
RDW: 12.6 % (ref 11.5–15.5)
WBC: 7.3 10*3/uL (ref 4.0–10.5)
nRBC: 0 % (ref 0.0–0.2)

## 2020-11-15 LAB — PROTIME-INR
INR: 1 (ref 0.8–1.2)
Prothrombin Time: 13.5 seconds (ref 11.4–15.2)

## 2020-11-15 LAB — COMPREHENSIVE METABOLIC PANEL
ALT: 10 U/L (ref 0–44)
AST: 16 U/L (ref 15–41)
Albumin: 3.7 g/dL (ref 3.5–5.0)
Alkaline Phosphatase: 59 U/L (ref 38–126)
Anion gap: 9 (ref 5–15)
BUN: 8 mg/dL (ref 8–23)
CO2: 27 mmol/L (ref 22–32)
Calcium: 9 mg/dL (ref 8.9–10.3)
Chloride: 100 mmol/L (ref 98–111)
Creatinine, Ser: 0.74 mg/dL (ref 0.44–1.00)
GFR, Estimated: >60 mL/min (ref >60)
Glucose, Bld: 101 mg/dL — ABNORMAL HIGH (ref 70–99)
Potassium: 3.5 mmol/L (ref 3.5–5.1)
Sodium: 136 mmol/L (ref 135–145)
Total Bilirubin: 0.2 mg/dL — ABNORMAL LOW (ref 0.3–1.2)
Total Protein: 7.1 g/dL (ref 6.5–8.1)

## 2020-11-15 LAB — SURGICAL PCR SCREEN
MRSA, PCR: NEGATIVE
Staphylococcus aureus: NEGATIVE

## 2020-11-15 LAB — SARS CORONAVIRUS 2 (TAT 6-24 HRS): SARS Coronavirus 2: NEGATIVE

## 2020-11-15 LAB — APTT: aPTT: 27 seconds (ref 24–36)

## 2020-11-15 NOTE — Progress Notes (Signed)
PCP - Vance Peper, NP Cardiologist - denies  PPM/ICD - denies   Chest x-ray - 06/12/18 EKG - 11/15/20 at PAT appt Stress Test - denies ECHO - 07/29/18 Cardiac Cath - denies  Sleep Study - denies   DM- denies  Blood Thinner Instructions: n/a Aspirin Instructions: n/a  ERAS Protcol - yes PRE-SURGERY Ensure given  COVID TEST- 11/15/20 at PAT appt, results pending   Anesthesia review: No  Patient denies shortness of breath, fever, cough and chest pain at PAT appointment   All instructions explained to the patient, with a verbal understanding of the material. Patient agrees to go over the instructions while at home for a better understanding. Patient also instructed to wear a mask in public after being tested for COVID-19. The opportunity to ask questions was provided.

## 2020-11-16 MED ORDER — TRANEXAMIC ACID 1000 MG/10ML IV SOLN
2000.0000 mg | INTRAVENOUS | Status: DC
  Filled 2020-11-16 (×2): qty 20

## 2020-11-18 NOTE — Anesthesia Preprocedure Evaluation (Addendum)
Anesthesia Evaluation  Patient identified by MRN, date of birth, ID band Patient awake    Reviewed: Allergy & Precautions, NPO status , Patient's Chart, lab work & pertinent test results  History of Anesthesia Complications (+) PONV and history of anesthetic complications  Airway Mallampati: II  TM Distance: >3 FB Neck ROM: Full    Dental  (+) Missing,    Pulmonary COPD, Current Smoker,    Pulmonary exam normal        Cardiovascular hypertension, Pt. on medications + CAD  Normal cardiovascular exam     Neuro/Psych  Headaches, Seizures -,  Anxiety Depression    GI/Hepatic Neg liver ROS, GERD  Medicated and Controlled,  Endo/Other  negative endocrine ROS  Renal/GU negative Renal ROS  negative genitourinary   Musculoskeletal  (+) Arthritis , right hip avasular necrosis   Abdominal   Peds  Hematology negative hematology ROS (+)   Anesthesia Other Findings Day of surgery medications reviewed with patient.  Reproductive/Obstetrics negative OB ROS                            Anesthesia Physical Anesthesia Plan  ASA: 3  Anesthesia Plan: Spinal   Post-op Pain Management:    Induction:   PONV Risk Score and Plan: 4 or greater and Treatment may vary due to age or medical condition, Ondansetron, Propofol infusion, Dexamethasone and Midazolam  Airway Management Planned: Natural Airway and Simple Face Mask  Additional Equipment: None  Intra-op Plan:   Post-operative Plan:   Informed Consent: I have reviewed the patients History and Physical, chart, labs and discussed the procedure including the risks, benefits and alternatives for the proposed anesthesia with the patient or authorized representative who has indicated his/her understanding and acceptance.       Plan Discussed with: CRNA  Anesthesia Plan Comments:        Anesthesia Quick Evaluation

## 2020-11-19 ENCOUNTER — Other Ambulatory Visit: Payer: Self-pay

## 2020-11-19 ENCOUNTER — Encounter (HOSPITAL_COMMUNITY)
Admission: RE | Disposition: A | Discharge status: Home / Self Care | Payer: Self-pay | Source: Home / Self Care | Attending: Orthopaedic Surgery

## 2020-11-19 ENCOUNTER — Observation Stay (HOSPITAL_COMMUNITY): Payer: Medicare Other

## 2020-11-19 ENCOUNTER — Ambulatory Visit (HOSPITAL_COMMUNITY): Payer: Medicare Other

## 2020-11-19 ENCOUNTER — Ambulatory Visit: Payer: Medicare Other

## 2020-11-19 ENCOUNTER — Encounter: Payer: Self-pay | Admitting: General Surgery

## 2020-11-19 ENCOUNTER — Ambulatory Visit (HOSPITAL_COMMUNITY): Payer: Medicare Other | Admitting: Anesthesiology

## 2020-11-19 ENCOUNTER — Observation Stay (HOSPITAL_COMMUNITY)
Admission: RE | Admit: 2020-11-19 | Discharge: 2020-11-20 | Disposition: A | Discharge status: Home / Self Care | Payer: Medicare Other | Attending: Orthopaedic Surgery | Admitting: Orthopaedic Surgery

## 2020-11-19 ENCOUNTER — Encounter (HOSPITAL_COMMUNITY): Payer: Self-pay | Admitting: Orthopaedic Surgery

## 2020-11-19 DIAGNOSIS — I10 Essential (primary) hypertension: Secondary | ICD-10-CM | POA: Diagnosis not present

## 2020-11-19 DIAGNOSIS — Z79899 Other long term (current) drug therapy: Secondary | ICD-10-CM | POA: Insufficient documentation

## 2020-11-19 DIAGNOSIS — M87051 Idiopathic aseptic necrosis of right femur: Secondary | ICD-10-CM

## 2020-11-19 DIAGNOSIS — Z419 Encounter for procedure for purposes other than remedying health state, unspecified: Secondary | ICD-10-CM

## 2020-11-19 DIAGNOSIS — J449 Chronic obstructive pulmonary disease, unspecified: Secondary | ICD-10-CM | POA: Diagnosis not present

## 2020-11-19 DIAGNOSIS — Z96649 Presence of unspecified artificial hip joint: Secondary | ICD-10-CM

## 2020-11-19 DIAGNOSIS — F17210 Nicotine dependence, cigarettes, uncomplicated: Secondary | ICD-10-CM | POA: Insufficient documentation

## 2020-11-19 DIAGNOSIS — Z96641 Presence of right artificial hip joint: Secondary | ICD-10-CM

## 2020-11-19 DIAGNOSIS — Z7982 Long term (current) use of aspirin: Secondary | ICD-10-CM | POA: Diagnosis not present

## 2020-11-19 HISTORY — PX: TOTAL HIP ARTHROPLASTY: SHX124

## 2020-11-19 SURGERY — ARTHROPLASTY, HIP, TOTAL, ANTERIOR APPROACH
Anesthesia: Spinal | Site: Hip | Laterality: Right

## 2020-11-19 MED ORDER — FENTANYL CITRATE (PF) 100 MCG/2ML IJ SOLN
INTRAMUSCULAR | Status: AC
  Filled 2020-11-19: qty 2

## 2020-11-19 MED ORDER — MENTHOL 3 MG MT LOZG: 1.0000 | LOZENGE | OROMUCOSAL | Status: DC | PRN

## 2020-11-19 MED ORDER — PROPOFOL 10 MG/ML IV BOLUS
INTRAVENOUS | Status: DC | PRN
  Administered 2020-11-19 (×3): 20 mg via INTRAVENOUS

## 2020-11-19 MED ORDER — CEFAZOLIN SODIUM-DEXTROSE 2-4 GM/100ML-% IV SOLN
2.0000 g | Freq: Four times a day (QID) | INTRAVENOUS | Status: AC
  Administered 2020-11-19 (×2): 2 g via INTRAVENOUS
  Filled 2020-11-19 (×2): qty 100

## 2020-11-19 MED ORDER — ACETAMINOPHEN 500 MG PO TABS
1000.0000 mg | ORAL_TABLET | Freq: Once | ORAL | Status: AC
  Administered 2020-11-19: 1000 mg via ORAL
  Filled 2020-11-19: qty 2

## 2020-11-19 MED ORDER — ONDANSETRON HCL 4 MG PO TABS: 4.0000 mg | ORAL_TABLET | Freq: Four times a day (QID) | ORAL | Status: DC | PRN

## 2020-11-19 MED ORDER — POVIDONE-IODINE 10 % EX SWAB
2.0000 "application " | Freq: Once | CUTANEOUS | Status: AC
  Administered 2020-11-19: 2 via TOPICAL

## 2020-11-19 MED ORDER — ALUM & MAG HYDROXIDE-SIMETH 200-200-20 MG/5ML PO SUSP: 30.0000 mL | ORAL | Status: DC | PRN

## 2020-11-19 MED ORDER — LIDOCAINE 2% (20 MG/ML) 5 ML SYRINGE
INTRAMUSCULAR | Status: DC | PRN
  Administered 2020-11-19: 20 mg via INTRAVENOUS

## 2020-11-19 MED ORDER — ORAL CARE MOUTH RINSE: 15.0000 mL | Freq: Once | OROMUCOSAL | Status: AC

## 2020-11-19 MED ORDER — BUPIVACAINE-MELOXICAM ER 400-12 MG/14ML IJ SOLN
INTRAMUSCULAR | Status: DC | PRN
  Administered 2020-11-19: 400 mg

## 2020-11-19 MED ORDER — LACTATED RINGERS IV SOLN: INTRAVENOUS | Status: DC

## 2020-11-19 MED ORDER — VANCOMYCIN HCL 1 G IV SOLR
INTRAVENOUS | Status: DC | PRN
  Administered 2020-11-19: 1000 mg via TOPICAL

## 2020-11-19 MED ORDER — 0.9 % SODIUM CHLORIDE (POUR BTL) OPTIME
TOPICAL | Status: DC | PRN
  Administered 2020-11-19: 1000 mL

## 2020-11-19 MED ORDER — PROMETHAZINE HCL 25 MG/ML IJ SOLN
INTRAMUSCULAR | Status: AC
  Filled 2020-11-19: qty 1

## 2020-11-19 MED ORDER — TRANEXAMIC ACID 1000 MG/10ML IV SOLN
INTRAVENOUS | Status: DC | PRN
  Administered 2020-11-19: 2000 mg via TOPICAL

## 2020-11-19 MED ORDER — ONDANSETRON HCL 4 MG/2ML IJ SOLN: 4.0000 mg | Freq: Four times a day (QID) | INTRAMUSCULAR | Status: DC | PRN

## 2020-11-19 MED ORDER — METHOCARBAMOL 500 MG PO TABS
500.0000 mg | ORAL_TABLET | Freq: Four times a day (QID) | ORAL | Status: DC | PRN
  Administered 2020-11-19 – 2020-11-20 (×2): 500 mg via ORAL
  Filled 2020-11-19 (×2): qty 1

## 2020-11-19 MED ORDER — METOCLOPRAMIDE HCL 5 MG PO TABS
5.0000 mg | ORAL_TABLET | Freq: Three times a day (TID) | ORAL | Status: DC | PRN
Start: 2020-11-19 — End: 2020-11-20

## 2020-11-19 MED ORDER — MIDAZOLAM HCL 2 MG/2ML IJ SOLN
INTRAMUSCULAR | Status: AC
  Filled 2020-11-19: qty 2

## 2020-11-19 MED ORDER — PHENOL 1.4 % MT LIQD: 1.0000 | OROMUCOSAL | Status: DC | PRN

## 2020-11-19 MED ORDER — ACETAMINOPHEN 500 MG PO TABS
1000.0000 mg | ORAL_TABLET | Freq: Four times a day (QID) | ORAL | Status: AC
  Administered 2020-11-19 – 2020-11-20 (×4): 1000 mg via ORAL
  Filled 2020-11-19 (×4): qty 2

## 2020-11-19 MED ORDER — ASPIRIN 81 MG PO CHEW
81.0000 mg | CHEWABLE_TABLET | Freq: Two times a day (BID) | ORAL | Status: DC
  Administered 2020-11-19 – 2020-11-20 (×2): 81 mg via ORAL
  Filled 2020-11-19 (×2): qty 1

## 2020-11-19 MED ORDER — PROPOFOL 10 MG/ML IV BOLUS
INTRAVENOUS | Status: AC
  Filled 2020-11-19: qty 20

## 2020-11-19 MED ORDER — MIDAZOLAM HCL 2 MG/2ML IJ SOLN
INTRAMUSCULAR | Status: DC | PRN
  Administered 2020-11-19: 2 mg via INTRAVENOUS

## 2020-11-19 MED ORDER — BUPIVACAINE IN DEXTROSE 0.75-8.25 % IT SOLN
INTRATHECAL | Status: DC | PRN
  Administered 2020-11-19: 1.6 mL via INTRATHECAL

## 2020-11-19 MED ORDER — METOCLOPRAMIDE HCL 5 MG/ML IJ SOLN: 5.0000 mg | Freq: Three times a day (TID) | INTRAMUSCULAR | Status: DC | PRN

## 2020-11-19 MED ORDER — METHOCARBAMOL 1000 MG/10ML IJ SOLN
500.0000 mg | Freq: Four times a day (QID) | INTRAVENOUS | Status: DC | PRN
  Filled 2020-11-19: qty 5

## 2020-11-19 MED ORDER — HYDROMORPHONE HCL 1 MG/ML IJ SOLN
0.5000 mg | INTRAMUSCULAR | Status: DC | PRN
  Administered 2020-11-20: 1 mg via INTRAVENOUS
  Filled 2020-11-19: qty 1

## 2020-11-19 MED ORDER — SODIUM CHLORIDE 0.9 % IR SOLN
Status: DC | PRN
  Administered 2020-11-19: 1000 mL

## 2020-11-19 MED ORDER — FENTANYL CITRATE (PF) 100 MCG/2ML IJ SOLN
25.0000 ug | INTRAMUSCULAR | Status: DC | PRN
  Administered 2020-11-19 (×3): 50 ug via INTRAVENOUS

## 2020-11-19 MED ORDER — OXYCODONE HCL ER 10 MG PO T12A
10.0000 mg | EXTENDED_RELEASE_TABLET | Freq: Two times a day (BID) | ORAL | Status: DC
  Administered 2020-11-19 – 2020-11-20 (×3): 10 mg via ORAL
  Filled 2020-11-19 (×3): qty 1

## 2020-11-19 MED ORDER — SODIUM CHLORIDE 0.9 % IV SOLN: INTRAVENOUS | Status: DC

## 2020-11-19 MED ORDER — PHENYLEPHRINE 40 MCG/ML (10ML) SYRINGE FOR IV PUSH (FOR BLOOD PRESSURE SUPPORT)
PREFILLED_SYRINGE | INTRAVENOUS | Status: DC | PRN
  Administered 2020-11-19: 40 ug via INTRAVENOUS
  Administered 2020-11-19 (×3): 80 ug via INTRAVENOUS

## 2020-11-19 MED ORDER — PANTOPRAZOLE SODIUM 40 MG PO TBEC
40.0000 mg | DELAYED_RELEASE_TABLET | Freq: Every day | ORAL | Status: DC
  Administered 2020-11-19 – 2020-11-20 (×2): 40 mg via ORAL
  Filled 2020-11-19 (×2): qty 1

## 2020-11-19 MED ORDER — PROPOFOL 1000 MG/100ML IV EMUL
INTRAVENOUS | Status: AC
  Filled 2020-11-19: qty 100

## 2020-11-19 MED ORDER — VANCOMYCIN HCL 1000 MG IV SOLR
INTRAVENOUS | Status: AC
  Filled 2020-11-19: qty 20

## 2020-11-19 MED ORDER — BUPIVACAINE-MELOXICAM ER 400-12 MG/14ML IJ SOLN
INTRAMUSCULAR | Status: AC
  Filled 2020-11-19: qty 1

## 2020-11-19 MED ORDER — OXYCODONE HCL 5 MG PO TABS: 5.0000 mg | ORAL_TABLET | Freq: Once | ORAL | Status: DC | PRN

## 2020-11-19 MED ORDER — EPHEDRINE SULFATE-NACL 50-0.9 MG/10ML-% IV SOSY
PREFILLED_SYRINGE | INTRAVENOUS | Status: DC | PRN
  Administered 2020-11-19: 10 mg via INTRAVENOUS
  Administered 2020-11-19: 5 mg via INTRAVENOUS
  Administered 2020-11-19: 10 mg via INTRAVENOUS
  Administered 2020-11-19: 5 mg via INTRAVENOUS

## 2020-11-19 MED ORDER — DOCUSATE SODIUM 100 MG PO CAPS
100.0000 mg | ORAL_CAPSULE | Freq: Two times a day (BID) | ORAL | Status: DC
  Administered 2020-11-19 – 2020-11-20 (×2): 100 mg via ORAL
  Filled 2020-11-19 (×2): qty 1

## 2020-11-19 MED ORDER — CHLORHEXIDINE GLUCONATE 0.12 % MT SOLN
15.0000 mL | Freq: Once | OROMUCOSAL | Status: AC
  Administered 2020-11-19: 15 mL via OROMUCOSAL
  Filled 2020-11-19: qty 15

## 2020-11-19 MED ORDER — PROMETHAZINE HCL 25 MG/ML IJ SOLN
6.2500 mg | INTRAMUSCULAR | Status: DC | PRN
Start: 2020-11-19 — End: 2020-11-19
  Administered 2020-11-19: 6.25 mg via INTRAVENOUS

## 2020-11-19 MED ORDER — OXYCODONE HCL 5 MG/5ML PO SOLN: 5.0000 mg | Freq: Once | ORAL | Status: DC | PRN

## 2020-11-19 MED ORDER — TRANEXAMIC ACID-NACL 1000-0.7 MG/100ML-% IV SOLN
1000.0000 mg | INTRAVENOUS | Status: AC
  Administered 2020-11-19: 1000 mg via INTRAVENOUS
  Filled 2020-11-19: qty 100

## 2020-11-19 MED ORDER — ONDANSETRON HCL 4 MG/2ML IJ SOLN
INTRAMUSCULAR | Status: DC | PRN
  Administered 2020-11-19: 4 mg via INTRAVENOUS

## 2020-11-19 MED ORDER — DEXAMETHASONE SODIUM PHOSPHATE 10 MG/ML IJ SOLN
10.0000 mg | Freq: Once | INTRAMUSCULAR | Status: AC
  Administered 2020-11-20: 10 mg via INTRAVENOUS
  Filled 2020-11-19: qty 1

## 2020-11-19 MED ORDER — SORBITOL 70 % SOLN: 30.0000 mL | Freq: Every day | Status: DC | PRN

## 2020-11-19 MED ORDER — DIPHENHYDRAMINE HCL 12.5 MG/5ML PO ELIX
25.0000 mg | ORAL_SOLUTION | ORAL | Status: DC | PRN
  Filled 2020-11-19: qty 10

## 2020-11-19 MED ORDER — DEXAMETHASONE SODIUM PHOSPHATE 10 MG/ML IJ SOLN
INTRAMUSCULAR | Status: DC | PRN
  Administered 2020-11-19: 10 mg via INTRAVENOUS

## 2020-11-19 MED ORDER — TRANEXAMIC ACID-NACL 1000-0.7 MG/100ML-% IV SOLN
1000.0000 mg | Freq: Once | INTRAVENOUS | Status: AC
  Administered 2020-11-19: 1000 mg via INTRAVENOUS
  Filled 2020-11-19: qty 100

## 2020-11-19 MED ORDER — CEFAZOLIN SODIUM-DEXTROSE 2-3 GM-%(50ML) IV SOLR
INTRAVENOUS | Status: DC | PRN
  Administered 2020-11-19: 2 g via INTRAVENOUS

## 2020-11-19 MED ORDER — ACETAMINOPHEN 325 MG PO TABS: 325.0000 mg | ORAL_TABLET | Freq: Four times a day (QID) | ORAL | Status: DC | PRN

## 2020-11-19 MED ORDER — CEFAZOLIN SODIUM-DEXTROSE 2-4 GM/100ML-% IV SOLN
2.0000 g | INTRAVENOUS | Status: DC
  Filled 2020-11-19: qty 100

## 2020-11-19 MED ORDER — OXYCODONE HCL 5 MG PO TABS
10.0000 mg | ORAL_TABLET | ORAL | Status: DC | PRN
  Administered 2020-11-19 – 2020-11-20 (×2): 10 mg via ORAL

## 2020-11-19 MED ORDER — LAMOTRIGINE 100 MG PO TABS
100.0000 mg | ORAL_TABLET | Freq: Two times a day (BID) | ORAL | Status: DC
  Administered 2020-11-19 – 2020-11-20 (×2): 100 mg via ORAL
  Filled 2020-11-19 (×3): qty 1

## 2020-11-19 MED ORDER — POLYETHYLENE GLYCOL 3350 17 G PO PACK
17.0000 g | PACK | Freq: Every day | ORAL | Status: DC
  Administered 2020-11-20: 17 g via ORAL
  Filled 2020-11-19: qty 1

## 2020-11-19 MED ORDER — OXYCODONE HCL 5 MG PO TABS
5.0000 mg | ORAL_TABLET | ORAL | Status: DC | PRN
  Administered 2020-11-19 – 2020-11-20 (×3): 10 mg via ORAL
  Filled 2020-11-19 (×5): qty 2

## 2020-11-19 MED ORDER — PROPOFOL 500 MG/50ML IV EMUL
INTRAVENOUS | Status: DC | PRN
  Administered 2020-11-19: 160 ug/kg/min via INTRAVENOUS

## 2020-11-19 MED ORDER — IRRISEPT - 450ML BOTTLE WITH 0.05% CHG IN STERILE WATER, USP 99.95% OPTIME
TOPICAL | Status: DC | PRN
  Administered 2020-11-19: 450 mL via TOPICAL

## 2020-11-19 SURGICAL SUPPLY — 67 items
ADH SKN CLS APL DERMABOND .7 (GAUZE/BANDAGES/DRESSINGS) ×1
BAG COUNTER SPONGE SURGICOUNT (BAG) ×2 IMPLANT
BAG DECANTER FOR FLEXI CONT (MISCELLANEOUS) ×2 IMPLANT
BAG SPNG CNTER NS LX DISP (BAG) ×1
CELLS DAT CNTRL 66122 CELL SVR (MISCELLANEOUS) IMPLANT
COOLER ICEMAN CLASSIC (MISCELLANEOUS) ×1 IMPLANT
COVER PERINEAL POST (MISCELLANEOUS) ×2 IMPLANT
COVER SURGICAL LIGHT HANDLE (MISCELLANEOUS) ×2 IMPLANT
DERMABOND ADVANCED (GAUZE/BANDAGES/DRESSINGS) ×1
DERMABOND ADVANCED .7 DNX12 (GAUZE/BANDAGES/DRESSINGS) IMPLANT
DRAPE C-ARM 42X72 X-RAY (DRAPES) ×2 IMPLANT
DRAPE POUCH INSTRU U-SHP 10X18 (DRAPES) ×2 IMPLANT
DRAPE STERI IOBAN 125X83 (DRAPES) ×2 IMPLANT
DRAPE U-SHAPE 47X51 STRL (DRAPES) ×4 IMPLANT
DRSG AQUACEL AG ADV 3.5X10 (GAUZE/BANDAGES/DRESSINGS) ×2 IMPLANT
DURAPREP 26ML APPLICATOR (WOUND CARE) ×4 IMPLANT
ELECT BLADE 4.0 EZ CLEAN MEGAD (MISCELLANEOUS) ×2
ELECT REM PT RETURN 9FT ADLT (ELECTROSURGICAL) ×2
ELECTRODE BLDE 4.0 EZ CLN MEGD (MISCELLANEOUS) ×1 IMPLANT
ELECTRODE REM PT RTRN 9FT ADLT (ELECTROSURGICAL) ×1 IMPLANT
GLOVE SURG LTX SZ7 (GLOVE) ×4 IMPLANT
GLOVE SURG SYN 7.5  E (GLOVE) ×8
GLOVE SURG SYN 7.5 E (GLOVE) ×4 IMPLANT
GLOVE SURG SYN 7.5 PF PI (GLOVE) ×4 IMPLANT
GLOVE SURG UNDER POLY LF SZ7 (GLOVE) ×40 IMPLANT
GLOVE SURG UNDER POLY LF SZ7.5 (GLOVE) ×4 IMPLANT
GOWN STRL REIN XL XLG (GOWN DISPOSABLE) ×2 IMPLANT
GOWN STRL REUS W/ TWL LRG LVL3 (GOWN DISPOSABLE) IMPLANT
GOWN STRL REUS W/ TWL XL LVL3 (GOWN DISPOSABLE) ×1 IMPLANT
GOWN STRL REUS W/TWL LRG LVL3 (GOWN DISPOSABLE)
GOWN STRL REUS W/TWL XL LVL3 (GOWN DISPOSABLE) ×2
HANDPIECE INTERPULSE COAX TIP (DISPOSABLE) ×2
HEAD CERAMIC DELTA 36 PLUS 1.5 (Hips) ×1 IMPLANT
HOOD PEEL AWAY FLYTE STAYCOOL (MISCELLANEOUS) ×5 IMPLANT
IV NS IRRIG 3000ML ARTHROMATIC (IV SOLUTION) ×2 IMPLANT
JET LAVAGE IRRISEPT WOUND (IRRIGATION / IRRIGATOR) ×2
KIT BASIN OR (CUSTOM PROCEDURE TRAY) ×2 IMPLANT
LAVAGE JET IRRISEPT WOUND (IRRIGATION / IRRIGATOR) ×1 IMPLANT
LINER NEUTRAL 52X36MM PLUS 4 (Liner) ×1 IMPLANT
MARKER SKIN DUAL TIP RULER LAB (MISCELLANEOUS) ×2 IMPLANT
NDL SPNL 18GX3.5 QUINCKE PK (NEEDLE) ×1 IMPLANT
NEEDLE SPNL 18GX3.5 QUINCKE PK (NEEDLE) ×2 IMPLANT
PACK TOTAL JOINT (CUSTOM PROCEDURE TRAY) ×2 IMPLANT
PACK UNIVERSAL I (CUSTOM PROCEDURE TRAY) ×2 IMPLANT
PIN SECTOR W/GRIP ACE CUP 52MM (Hips) ×1 IMPLANT
RETRACTOR WND ALEXIS 18 MED (MISCELLANEOUS) IMPLANT
RTRCTR WOUND ALEXIS 18CM MED (MISCELLANEOUS)
SAW OSC TIP CART 19.5X105X1.3 (SAW) ×2 IMPLANT
SCREW 6.5MMX25MM (Screw) ×1 IMPLANT
SET HNDPC FAN SPRY TIP SCT (DISPOSABLE) ×1 IMPLANT
SPONGE T-LAP 18X18 ~~LOC~~+RFID (SPONGE) ×2 IMPLANT
STAPLER VISISTAT 35W (STAPLE) IMPLANT
STEM FEM ACTIS STD SZ7 (Nail) ×1 IMPLANT
SUT ETHIBOND 2 V 37 (SUTURE) ×2 IMPLANT
SUT ETHILON 2 0 PSLX (SUTURE) ×2 IMPLANT
SUT VIC AB 0 CT1 27 (SUTURE) ×2
SUT VIC AB 0 CT1 27XBRD ANBCTR (SUTURE) ×1 IMPLANT
SUT VIC AB 1 CTX 36 (SUTURE) ×2
SUT VIC AB 1 CTX36XBRD ANBCTR (SUTURE) ×1 IMPLANT
SUT VIC AB 2-0 CT1 27 (SUTURE) ×4
SUT VIC AB 2-0 CT1 TAPERPNT 27 (SUTURE) ×2 IMPLANT
SYR 50ML LL SCALE MARK (SYRINGE) ×2 IMPLANT
TOWEL GREEN STERILE (TOWEL DISPOSABLE) ×2 IMPLANT
TRAY CATH 16FR W/PLASTIC CATH (SET/KITS/TRAYS/PACK) IMPLANT
TRAY FOLEY W/BAG SLVR 16FR (SET/KITS/TRAYS/PACK) ×2
TRAY FOLEY W/BAG SLVR 16FR ST (SET/KITS/TRAYS/PACK) ×1 IMPLANT
YANKAUER SUCT BULB TIP NO VENT (SUCTIONS) ×2 IMPLANT

## 2020-11-19 NOTE — Evaluation (Signed)
Physical Therapy Evaluation Patient Details Name: Kayla Shah MRN: [id number] DOB: February 25, 1955 Today's Date: 11/19/2020  History of Present Illness  Pt is a 65 y/o female s/p R THA, direct anterior. PMH includes COPD, HTN, and seizures.  Clinical Impression  Pt is s/p surgery above with deficits below. Pt tolerated gait training well this session. Required min guard A for transfers and gait using RW. Verbally reviewed HEP with pt. Will continue to follow acutely.      Recommendations for follow up therapy are one component of a multi-disciplinary discharge planning process, led by the attending physician.  Recommendations may be updated based on patient status, additional functional criteria and insurance authorization.  Follow Up Recommendations Follow surgeon's recommendation for DC plan and follow-up therapies    Equipment Recommendations  None recommended by PT    Recommendations for Other Services       Precautions / Restrictions Precautions Precautions: Fall Restrictions Weight Bearing Restrictions: Yes RLE Weight Bearing: Weight bearing as tolerated      Mobility  Bed Mobility Overal bed mobility: Needs Assistance Bed Mobility: Supine to Sit     Supine to sit: Supervision     General bed mobility comments: supervision for safety    Transfers Overall transfer level: Needs assistance Equipment used: Rolling walker (2 wheeled) Transfers: Sit to/from Stand Sit to Stand: Min guard         General transfer comment: Min guard for safety. Demonstrated safe hand placement.  Ambulation/Gait Ambulation/Gait assistance: Min guard Gait Distance (Feet): 50 Feet Assistive device: Rolling walker (2 wheeled) Gait Pattern/deviations: Step-to pattern;Decreased step length - right;Decreased step length - left;Decreased weight shift to right;Antalgic Gait velocity: Decreased   General Gait Details: Slow, mildly antalgic gait. Cues for sequencing using RW. Min guard  for safety.  Stairs            Wheelchair Mobility    Modified Rankin (Stroke Patients Only)       Balance Overall balance assessment: Needs assistance Sitting-balance support: No upper extremity supported;Feet supported Sitting balance-Leahy Scale: Good     Standing balance support: Bilateral upper extremity supported;During functional activity Standing balance-Leahy Scale: Poor Standing balance comment: Reliant on BUE support                             Pertinent Vitals/Pain Pain Assessment: Faces Faces Pain Scale: Hurts little more Pain Location: R hip Pain Descriptors / Indicators: Grimacing;Guarding Pain Intervention(s): Limited activity within patient's tolerance;Monitored during session;Repositioned    Home Living Family/patient expects to be discharged to:: Private residence Living Arrangements: Spouse/significant other;Other relatives Available Help at Discharge: Family Type of Home: House Home Access: Stairs to enter Entrance Stairs-Rails: None Entrance Stairs-Number of Steps: 1 (porch step) Home Layout: One level Home Equipment: Tub bench;Walker - 2 wheels      Prior Function Level of Independence: Independent with assistive device(s)         Comments: Has been using RW for mobility     Hand Dominance        Extremity/Trunk Assessment   Upper Extremity Assessment Upper Extremity Assessment: Overall WFL for tasks assessed    Lower Extremity Assessment Lower Extremity Assessment: RLE deficits/detail RLE Deficits / Details: Deficits consistent with post op pain and weakness.    Cervical / Trunk Assessment Cervical / Trunk Assessment: Normal  Communication   Communication: No difficulties  Cognition Arousal/Alertness: Awake/alert Behavior During Therapy: WFL for tasks assessed/performed Overall Cognitive Status:  Within Functional Limits for tasks assessed                                        General  Comments      Exercises     Assessment/Plan    PT Assessment Patient needs continued PT services  PT Problem List Decreased strength;Decreased range of motion;Decreased activity tolerance;Decreased balance;Decreased mobility;Decreased knowledge of use of DME;Pain       PT Treatment Interventions DME instruction;Gait training;Stair training;Therapeutic activities;Therapeutic exercise;Functional mobility training;Neuromuscular re-education;Balance training;Patient/family education    PT Goals (Current goals can be found in the Care Plan section)  Acute Rehab PT Goals Patient Stated Goal: to go home and be independent PT Goal Formulation: With patient Time For Goal Achievement: 12/03/20 Potential to Achieve Goals: Good    Frequency 7X/week   Barriers to discharge        Co-evaluation               AM-PAC PT "6 Clicks" Mobility  Outcome Measure Help needed turning from your back to your side while in a flat bed without using bedrails?: None Help needed moving from lying on your back to sitting on the side of a flat bed without using bedrails?: None Help needed moving to and from a bed to a chair (including a wheelchair)?: A Little Help needed standing up from a chair using your arms (e.g., wheelchair or bedside chair)?: A Little Help needed to walk in hospital room?: A Little Help needed climbing 3-5 steps with a railing? : A Little 6 Click Score: 20    End of Session Equipment Utilized During Treatment: Gait belt Activity Tolerance: Patient tolerated treatment well Patient left: in chair;with call bell/phone within reach Nurse Communication: Mobility status PT Visit Diagnosis: Other abnormalities of gait and mobility (R26.89);Pain Pain - Right/Left: Right Pain - part of body: Hip    Time: 1537-1601 PT Time Calculation (min) (ACUTE ONLY): 24 min   Charges:   PT Evaluation $PT Eval Low Complexity: 1 Low PT Treatments $Gait Training: 8-22 mins         Kayla Shah, DPT  Acute Rehabilitation Services  Pager: [phone] Office: [phone]   Kayla Shah 11/19/2020, 4:32 PM

## 2020-11-19 NOTE — Discharge Instructions (Signed)

## 2020-11-19 NOTE — H&P (Signed)
PREOPERATIVE H&P  Chief Complaint: right hip avasular necrosis  HPI: Kayla Shah is a 65 y.o. female who presents for surgical treatment of right hip avasular necrosis.  She denies any changes in medical history.  Past Medical History:  Diagnosis Date   Allergy    Anxiety    agoraphobia    Arthritis    Chicken pox    COPD (chronic obstructive pulmonary disease) (HCC)    Depression    Family history of adverse reaction to anesthesia    PONV   GERD (gastroesophageal reflux disease)    Headache    History of blood transfusion    1984   Hyperlipidemia    Hypertension    Migraine    PONV (postoperative nausea and vomiting)    Seizure ([place])    last seizure 04/2019   Past Surgical History:  Procedure Laterality Date   APPENDECTOMY  1984   CATARACT EXTRACTION, BILATERAL Bilateral    COLONOSCOPY WITH PROPOFOL N/A 03/26/2017   Procedure: COLONOSCOPY WITH PROPOFOL;  Surgeon: Jonathon Bellows, MD;  Location: [facility] ENDOSCOPY;  Service: Gastroenterology;  Laterality: N/A;   TOTAL ABDOMINAL HYSTERECTOMY  1984   Total   VENTRAL HERNIA REPAIR N/A 09/16/2019   Procedure: HERNIA REPAIR VENTRAL ADULT, Open;  Surgeon: Fredirick Maudlin, MD;  Location: [facility] ORS;  Service: General;  Laterality: N/A;   WISDOM TOOTH EXTRACTION     Social History   Socioeconomic History   Marital status: Divorced    Spouse name: Legrand Como   Number of children: 0   Years of education: 12   Highest education level: Not on file  Occupational History    Comment: disabled  Tobacco Use   Smoking status: Every Day    Packs/day: 1.00    Years: 45.00    Pack years: 45.00    Types: Cigarettes   Smokeless tobacco: Never   Tobacco comments:    reports quitting in feb 2021  Vaping Use   Vaping Use: Never used  Substance and Sexual Activity   Alcohol use: No   Drug use: Yes    Types: Marijuana    Comment: every day   Sexual activity: Never  Other Topics Concern   Not on file  Social History Narrative    Disabled    Married no kids    No guns, wears seat belt, feels safe in relationship    12th grade ed.    Social Determinants of Health   Financial Resource Strain: Not on file  Food Insecurity: Not on file  Transportation Needs: Not on file  Physical Activity: Not on file  Stress: Not on file  Social Connections: Not on file   Family History  Problem Relation Age of Onset   Stroke Mother    Depression Mother    Arthritis Mother    Heart disease Father    Alcohol abuse Father    Diabetes Father    Osteoporosis Sister    Kidney disease Sister    Arthritis Sister    Dementia Sister    Arthritis Maternal Grandmother    Heart disease Maternal Grandmother    Hyperlipidemia Maternal Grandmother    Diabetes Paternal Grandmother    Breast cancer Neg Hx    Allergies  Allergen Reactions   Codeine Nausea And Vomiting   Other     No Anti Depressants - "makes me crazy"   Fluoxetine Anxiety   Paroxetine Anxiety    Becomes very aggressive with mood swings.   Prior  to Admission medications   Medication Sig Start Date End Date Taking? Authorizing Provider  aspirin EC 81 MG tablet Take 1 tablet (81 mg total) by mouth 2 (two) times daily. To be taken after surgery 11/12/20   Aundra Dubin, PA-C  diazepam (VALIUM) 5 MG tablet Take 5 mg by mouth 3 (three) times daily as needed for anxiety. 02/09/20  Yes [provider]  docusate sodium (COLACE) 100 MG capsule Take 1 capsule (100 mg total) by mouth daily as needed. 11/12/20 11/12/21  Aundra Dubin, PA-C  famotidine (PEPCID) 20 MG tablet TAKE 1/2 TABLET (10 MG TOTAL) BY MOUTH 2TIMES DAILY 10/23/20  Yes Johnson, Megan P, DO  ibuprofen (ADVIL) 200 MG tablet Take 400 mg by mouth every 6 (six) hours as needed for moderate pain.   Yes [provider]  lamoTRIgine (LAMICTAL) 100 MG tablet Take 1 tablet (100 mg total) by mouth 2 (two) times daily. 09/19/20  Yes Lomax, [name], NP  methocarbamol (ROBAXIN) 500 MG tablet Take 1  tablet (500 mg total) by mouth 2 (two) times daily as needed. To be taken after surgery 11/12/20   Aundra Dubin, PA-C  ondansetron (ZOFRAN) 4 MG tablet Take 1 tablet (4 mg total) by mouth every 8 (eight) hours as needed for nausea or vomiting. 11/12/20   Aundra Dubin, PA-C  oxyCODONE-acetaminophen (PERCOCET) 5-325 MG tablet Take 1-2 tablets by mouth every 6 (six) hours as needed. To be taken after surgery 11/12/20   Aundra Dubin, PA-C  pantoprazole (PROTONIX) 40 MG tablet Take 1 tablet (40 mg total) by mouth daily. 08/30/20  Yes McElwee, Lauren A, NP  [name] 1.25 MG tablet TAKE 1 TABLET BY MOUTH DAILY 06/02/20  Yes Baity, Coralie Keens, NP  rizatriptan (MAXALT-MLT) 10 MG disintegrating tablet Take 1 tablet (10 mg total) by mouth as needed for migraine. May repeat in 2 hours if needed 09/19/20  Yes Lomax, [name], NP  simvastatin (ZOCOR) 20 MG tablet TAKE 1 TABLET BY MOUTH DAILY AT 6 PM 09/13/20  Yes McElwee, Lauren A, NP  verapamil (CALAN-SR) 240 MG CR tablet Take 1 tablet (240 mg total) by mouth daily. 10/30/20  Yes McElwee, Lauren A, NP  vitamin B-12 (CYANOCOBALAMIN) 500 MCG tablet Take 500 mcg by mouth daily.   Yes [provider]  traMADol (ULTRAM) 50 MG tablet Take 1 tablet (50 mg total) by mouth every 12 (twelve) hours as needed. 11/12/20   Aundra Dubin, PA-C     Positive ROS: All other systems have been reviewed and were otherwise negative with the exception of those mentioned in the HPI and as above.  Physical Exam: General: Alert, no acute distress Cardiovascular: No pedal edema Respiratory: No cyanosis, no use of accessory musculature GI: abdomen soft Skin: No lesions in the area of chief complaint Neurologic: Sensation intact distally Psychiatric: Patient is competent for consent with normal mood and affect Lymphatic: no lymphedema  MUSCULOSKELETAL: exam stable  Assessment: right hip avasular necrosis  Plan: Plan for Procedure(s): RIGHT TOTAL HIP ARTHROPLASTY  ANTERIOR APPROACH  The risks benefits and alternatives were discussed with the patient including but not limited to the risks of nonoperative treatment, versus surgical intervention including infection, bleeding, nerve injury,  blood clots, cardiopulmonary complications, morbidity, mortality, among others, and they were willing to proceed.   Preoperative templating of the joint replacement has been completed, documented, and submitted to the Operating Room personnel in order to optimize intra-operative equipment management.   Eduard Roux, MD 11/19/2020 5:53  AM

## 2020-11-19 NOTE — Anesthesia Procedure Notes (Addendum)
Spinal  Patient location during procedure: OR Start time: 11/19/2020 7:26 AM End time: 11/19/2020 7:30 AM Reason for block: surgical anesthesia Staffing Performed: anesthesiologist  Anesthesiologist: Brennan Bailey, MD Preanesthetic Checklist Completed: patient identified, IV checked, risks and benefits discussed, surgical consent, monitors and equipment checked, pre-op evaluation and timeout performed Spinal Block Patient position: sitting Prep: DuraPrep and site prepped and draped Patient monitoring: continuous pulse ox, blood pressure and heart rate Approach: midline Location: L3-4 Injection technique: single-shot Needle Needle type: Pencan  Needle gauge: 24 G Needle length: 9 cm Assessment Events: CSF return Additional Notes Risks, benefits, and alternative discussed. Patient gave consent to procedure. Prepped and draped in sitting position. Patient sedated but responsive to voice. Clear CSF obtained after one needle redirection. Positive terminal aspiration. No pain or paraesthesias with injection. Patient tolerated procedure well. Vital signs stable. Tawny Asal, MD

## 2020-11-19 NOTE — Transfer of Care (Signed)
Immediate Anesthesia Transfer of Care Note  Patient: Echo Propp [facility]  Procedure(s) Performed: RIGHT TOTAL HIP ARTHROPLASTY ANTERIOR APPROACH (Right: Hip)  Patient Location: PACU  Anesthesia Type:Spinal  Level of Consciousness: awake, alert , oriented and patient cooperative  Airway & Oxygen Therapy: Patient Spontanous Breathing  Post-op Assessment: Report given to RN and Post -op Vital signs reviewed and stable  Post vital signs: stable  Last Vitals:  Vitals Value Taken Time  BP    Temp    Pulse 72 11/19/20 0910  Resp 18 11/19/20 0910  SpO2 99 % 11/19/20 0910    Last Pain:  Vitals:   11/19/20 0626  TempSrc:   PainSc: 8       Patients Stated Pain Goal: 3 (04/59/13 6859)  Complications: No notable events documented.

## 2020-11-19 NOTE — Op Note (Signed)
RIGHT TOTAL HIP ARTHROPLASTY ANTERIOR APPROACH  Procedure Note Camera [facility]   [id number]  Pre-op Diagnosis: right hip avasular necrosis     Post-op Diagnosis: same   Operative Procedures  1. Total hip replacement; Right hip; uncemented [id number]   Surgeon: Frankey Shown, M.D.  Assist: Madalyn Rob, PA-C   Anesthesia: spinal, local  Prosthesis: Depuy Acetabulum: Pinnacle 52 mm Femur: Actis 7 STD Head: 36 mm size: +1.5 Liner: +4 Bearing Type: ceramic/poly  Total Hip Arthroplasty (Anterior Approach) Op Note:  After informed consent was obtained and the operative extremity marked in the holding area, the patient was brought back to the operating room and placed supine on the HANA table. Next, the operative extremity was prepped and draped in normal sterile fashion. Surgical timeout occurred verifying patient identification, surgical site, surgical procedure and administration of antibiotics.  A modified anterior Smith-Peterson approach to the hip was performed, using the interval between tensor fascia lata and sartorius.  Dissection was carried bluntly down onto the anterior hip capsule. The lateral femoral circumflex vessels were identified and coagulated. A capsulotomy was performed and the capsular flaps tagged for later repair.  The neck osteotomy was performed. The femoral head was removed which showed flattened changes consistent with AVN, the acetabular rim was cleared of soft tissue and attention was turned to reaming the acetabulum.  Sequential reaming was performed under fluoroscopic guidance. We reamed to a size 51 mm, and then impacted the acetabular shell. A 25 mm cancellous screw was placed through the shell for added fixation.  The liner was then placed after irrigation and attention turned to the femur.  After placing the femoral hook, the leg was taken to externally rotated, extended and adducted position taking care to perform soft tissue releases to allow for  adequate mobilization of the femur. Soft tissue was cleared from the shoulder of the greater trochanter and the hook elevator used to improve exposure of the proximal femur. Sequential broaching performed up to a size 7. Trial neck and head were placed. The leg was brought back up to neutral and the construct reduced.  Antibiotic irrigation was placed in the surgical wound and kept for at least 1 minute.  The position and sizing of components, offset and leg lengths were checked using fluoroscopy. Stability of the construct was checked in extension and external rotation without any subluxation or impingement of prosthesis. We dislocated the prosthesis, dropped the leg back into position, removed trial components, and irrigated copiously. The final stem and head was then placed, the leg brought back up, the system reduced and fluoroscopy used to verify positioning.  We irrigated, obtained hemostasis and closed the capsule using #2 ethibond suture.  One gram of vancomycin powder was placed in the surgical bed.   One gram of topical tranexamic acid was injected into the joint.  The fascia was closed with #1 vicryl plus, the deep fat layer was closed with 0 vicryl, the subcutaneous layers closed with 2.0 Vicryl Plus and the skin closed with 2.0 nylon and dermabond. A sterile dressing was applied. The patient was awakened in the operating room and taken to recovery in stable condition.  All sponge, needle, and instrument counts were correct at the end of the case.   Tawanna Cooler, my PA, was a medical necessity for opening, closing, limb positioning, retracting, exposing, and overall facilitation and timely completion of the surgery.  Position: supine  Complications: see description of procedure.  Time Out: performed   Drains/Packing: none  Estimated blood loss: see anesthesia record  Returned to Recovery Room: in good condition.   Antibiotics: yes   Mechanical VTE (DVT) Prophylaxis: sequential  compression devices, TED thigh-high  Chemical VTE (DVT) Prophylaxis: aspirin   Fluid Replacement: see anesthesia record  Specimens Removed: 1 to pathology   Sponge and Instrument Count Correct? yes   PACU: portable radiograph - low AP   Plan/RTC: Return in 2 weeks for staple removal. Weight Bearing/Load Lower Extremity: full  Hip precautions: none Suture Removal: 2 weeks   N. Eduard Roux, MD [facility] 8:43 AM   Implant Name Type Inv. Item Serial No. Manufacturer Lot No. LRB No. Used Action  PIN SECTOR W/GRIP ACE CUP 52MM - [id number] Hips PIN SECTOR W/GRIP ACE CUP 52MM  [employer] [id number] Right 1 Implanted  SCREW 6.5MMX25MM - [id number] Screw SCREW 6.5MMX25MM  [employer] [id number] Right 1 Implanted  LINER NEUTRAL 52X36MM PLUS 4 - [id number] Liner LINER NEUTRAL 52X36MM PLUS 4  [employer] [id number] Right 1 Implanted  HEAD CERAMIC DELTA 36 PLUS 1.5 - [id number] Hips HEAD CERAMIC DELTA 36 PLUS 1.5  [employer] [id number] Right 1 Implanted  STEM FEM ACTIS STD SZ7 - [id number] Nail STEM FEM ACTIS STD SZ7  [employer] [id number] Right 1 Implanted

## 2020-11-19 NOTE — Anesthesia Postprocedure Evaluation (Signed)
Anesthesia Post Note  Patient: Lilyann Gravelle [facility]  Procedure(s) Performed: RIGHT TOTAL HIP ARTHROPLASTY ANTERIOR APPROACH (Right: Hip)     Patient location during evaluation: PACU Anesthesia Type: Spinal Level of consciousness: awake and alert and oriented Pain management: pain level controlled Vital Signs Assessment: post-procedure vital signs reviewed and stable Respiratory status: spontaneous breathing, nonlabored ventilation and respiratory function stable Cardiovascular status: blood pressure returned to baseline Postop Assessment: no apparent nausea or vomiting, spinal receding, no headache and no backache Anesthetic complications: no   No notable events documented.  Last Vitals:  Vitals:   11/19/20 0955 11/19/20 1010  BP: (!) 142/86 (!) 111/93  Pulse: 73 75  Resp: 18 20  Temp:  (!) 36.3 C  SpO2: 100% 99%    Last Pain:  Vitals:   11/19/20 1015  TempSrc:   PainSc: 6     LLE Motor Response: Purposeful movement (11/19/20 1010) LLE Sensation: Decreased (11/19/20 1010) RLE Motor Response: Purposeful movement (11/19/20 1010) RLE Sensation: Decreased (11/19/20 1010) L Sensory Level: S1-Sole of foot, small toes (11/19/20 1010) R Sensory Level: S1-Sole of foot, small toes (11/19/20 1010)  Marthenia Rolling

## 2020-11-20 DIAGNOSIS — M87051 Idiopathic aseptic necrosis of right femur: Secondary | ICD-10-CM | POA: Diagnosis not present

## 2020-11-20 LAB — CBC
HCT: 33.8 % — ABNORMAL LOW (ref 36.0–46.0)
Hemoglobin: 11.6 g/dL — ABNORMAL LOW (ref 12.0–15.0)
MCH: 33.6 pg (ref 26.0–34.0)
MCHC: 34.3 g/dL (ref 30.0–36.0)
MCV: 98 fL (ref 80.0–100.0)
Platelets: 205 10*3/uL (ref 150–400)
RBC: 3.45 MIL/uL — ABNORMAL LOW (ref 3.87–5.11)
RDW: 12.6 % (ref 11.5–15.5)
WBC: 12.9 10*3/uL — ABNORMAL HIGH (ref 4.0–10.5)
nRBC: 0 % (ref 0.0–0.2)

## 2020-11-20 LAB — BASIC METABOLIC PANEL
Anion gap: 9 (ref 5–15)
BUN: 8 mg/dL (ref 8–23)
CO2: 28 mmol/L (ref 22–32)
Calcium: 9.3 mg/dL (ref 8.9–10.3)
Chloride: 99 mmol/L (ref 98–111)
Creatinine, Ser: 0.83 mg/dL (ref 0.44–1.00)
GFR, Estimated: >60 mL/min (ref >60)
Glucose, Bld: 129 mg/dL — ABNORMAL HIGH (ref 70–99)
Potassium: 3.9 mmol/L (ref 3.5–5.1)
Sodium: 136 mmol/L (ref 135–145)

## 2020-11-20 NOTE — TOC Progression Note (Signed)
Transition of Care [facility]) - Progression Note    Patient Details  Name: Kayla Shah MRN: [id number] Date of Birth: [birth year]  Transition of Care [facility]) CM/SW [place], RN Phone Number:[phone]  11/20/2020, 9:34 AM  Clinical Narrative:    Home Health previously arranged with [place].        Expected Discharge Plan and Services           Expected Discharge Date: 11/20/20                                     Social Determinants of Health (SDOH) Interventions    Readmission Risk Interventions No flowsheet data found.

## 2020-11-20 NOTE — Plan of Care (Signed)
Pt and family given D/C instructions with verbal understanding. Rx's were sent to the pharmacy by MD. Pt's incision is clean an dry with no sign of infection. Pt's IV was removed prior to D/C. Pt's Home Health was arranged per MD order. Pt received RW per MD order. Pt D/C'd home via wheelchair per MD order. Pt is stable @ D/C and has no other needs at this time. Holli Humbles, RN

## 2020-11-20 NOTE — Discharge Summary (Signed)
Patient ID: Kayla Shah [facility] MRN: [id number] DOB/AGE: Jan 08, 1956 65 y.o.  Admit date: 11/19/2020 Discharge date: 11/20/2020  Admission Diagnoses:  Principal Problem:   Avascular necrosis of right femoral head ([place]) Active Problems:   Status post total replacement of right hip   Discharge Diagnoses:  Same  Past Medical History:  Diagnosis Date   Allergy    Anxiety    agoraphobia    Arthritis    Chicken pox    COPD (chronic obstructive pulmonary disease) ([place])    Depression    Family history of adverse reaction to anesthesia    PONV   GERD (gastroesophageal reflux disease)    Headache    History of blood transfusion    1984   Hyperlipidemia    Hypertension    Migraine    PONV (postoperative nausea and vomiting)    Seizure ([place])    last seizure 04/2019    Surgeries: Procedure(s): RIGHT TOTAL HIP ARTHROPLASTY ANTERIOR APPROACH on 11/19/2020   Consultants:   Discharged Condition: Improved  Hospital Course: Kimberleigh Mehan is an 65 y.o. female who was admitted 11/19/2020 for operative treatment ofAvascular necrosis of right femoral head ([place]). Patient has severe unremitting pain that affects sleep, daily activities, and work/hobbies. After pre-op clearance the patient was taken to the operating room on 11/19/2020 and underwent  Procedure(s): RIGHT TOTAL HIP ARTHROPLASTY ANTERIOR APPROACH.    Patient was given perioperative antibiotics:  Anti-infectives (From admission, onward)    Start     Dose/Rate Route Frequency Ordered Stop   11/19/20 1300  ceFAZolin (ANCEF) IVPB 2g/100 mL premix        2 g 200 mL/hr over 30 Minutes Intravenous Every 6 hours 11/19/20 1214 11/19/20 1737   11/19/20 0805  vancomycin (VANCOCIN) powder  Status:  Discontinued          As needed 11/19/20 0805 11/19/20 0906   11/19/20 0600  ceFAZolin (ANCEF) IVPB 2g/100 mL premix  Status:  Discontinued        2 g 200 mL/hr over 30 Minutes Intravenous On call to O.R. 11/19/20 0552 11/19/20 1213         Patient was given sequential compression devices, early ambulation, and chemoprophylaxis to prevent DVT.  Patient benefited maximally from hospital stay and there were no complications.    Recent vital signs: Patient Vitals for the past 24 hrs:  BP Temp Temp src Pulse Resp SpO2  11/20/20 0735 128/81 97.8 F (36.6 C) Oral 84 18 96 %  11/20/20 0333 (!) 147/79 97.7 F (36.5 C) Oral 83 18 98 %  11/19/20 2308 (!) 143/81 97.8 F (36.6 C) Oral 85 18 98 %  11/19/20 2002 124/78 (!) 97.5 F (36.4 C) Oral 86 18 96 %  11/19/20 1615 (!) 158/57 97.9 F (36.6 C) Oral 79 18 98 %  11/19/20 1211 (!) 159/96 97.8 F (36.6 C) Oral 69 20 99 %  11/19/20 1125 (!) 154/83 -- -- 64 (!) 29 99 %  11/19/20 1055 (!) 144/83 -- -- 70 17 99 %  11/19/20 1045 -- -- -- -- -- (!) 0 %  11/19/20 1040 (!) 149/79 -- -- 78 18 96 %  11/19/20 1010 (!) 111/93 (!) 97.3 F (36.3 C) -- 75 20 99 %  11/19/20 0955 (!) 142/86 -- -- 73 18 100 %  11/19/20 0940 124/80 -- -- 70 10 100 %  11/19/20 0925 -- -- -- 72 15 100 %  11/19/20 0910 (!) 121/59 (!) 97.3 F (36.3 C) -- 72  18 99 %     Recent laboratory studies:  Recent Labs    11/20/20 0511  WBC 12.9*  HGB 11.6*  HCT 33.8*  PLT 205  NA 136  K 3.9  CL 99  CO2 28  BUN 8  CREATININE 0.83  GLUCOSE 129*  CALCIUM 9.3     Discharge Medications:   Allergies as of 11/20/2020       Reactions   Codeine Nausea And Vomiting   Other    No Anti Depressants - "makes me crazy"   Fluoxetine Anxiety   Paroxetine Anxiety   Becomes very aggressive with mood swings.        Medication List     STOP taking these medications    diazepam 5 MG tablet Commonly known as: VALIUM   ibuprofen 200 MG tablet Commonly known as: ADVIL   traMADol 50 MG tablet Commonly known as: ULTRAM       TAKE these medications    aspirin EC 81 MG tablet Take 1 tablet (81 mg total) by mouth 2 (two) times daily. To be taken after surgery   docusate sodium 100 MG  capsule Commonly known as: Colace Take 1 capsule (100 mg total) by mouth daily as needed.   famotidine 20 MG tablet Commonly known as: PEPCID TAKE 1/2 TABLET (10 MG TOTAL) BY MOUTH 2TIMES DAILY   lamoTRIgine 100 MG tablet Commonly known as: LAMICTAL Take 1 tablet (100 mg total) by mouth 2 (two) times daily.   methocarbamol 500 MG tablet Commonly known as: Robaxin Take 1 tablet (500 mg total) by mouth 2 (two) times daily as needed. To be taken after surgery   ondansetron 4 MG tablet Commonly known as: Zofran Take 1 tablet (4 mg total) by mouth every 8 (eight) hours as needed for nausea or vomiting.   oxyCODONE-acetaminophen 5-325 MG tablet Commonly known as: Percocet Take 1-2 tablets by mouth every 6 (six) hours as needed. To be taken after surgery   pantoprazole 40 MG tablet Commonly known as: PROTONIX Take 1 tablet (40 mg total) by mouth daily.   [name] 1.25 MG tablet Generic drug: estrogens (conjugated) TAKE 1 TABLET BY MOUTH DAILY   rizatriptan 10 MG disintegrating tablet Commonly known as: MAXALT-MLT Take 1 tablet (10 mg total) by mouth as needed for migraine. May repeat in 2 hours if needed   simvastatin 20 MG tablet Commonly known as: ZOCOR TAKE 1 TABLET BY MOUTH DAILY AT 6 PM   verapamil 240 MG CR tablet Commonly known as: CALAN-SR Take 1 tablet (240 mg total) by mouth daily.   vitamin B-12 500 MCG tablet Commonly known as: CYANOCOBALAMIN Take 500 mcg by mouth daily.               Durable Medical Equipment  (From admission, onward)           Start     Ordered   11/19/20 1215  DME Walker rolling  Once       Question:  Patient needs a walker to treat with the following condition  Answer:  History of hip replacement   11/19/20 1214   11/19/20 1215  DME 3 n 1  Once        11/19/20 1214   11/19/20 1215  DME Bedside commode  Once       Question:  Patient needs a bedside commode to treat with the following condition  Answer:  History of hip  replacement   11/19/20 1214  Diagnostic Studies: DG Pelvis Portable  Result Date: 11/19/2020 CLINICAL DATA:  Right hip replacement EXAM: PORTABLE PELVIS 1-2 VIEWS COMPARISON:  11/06/2020 FINDINGS: Total right hip arthroplasty. The prosthesis is well seated and located. IMPRESSION: No unexpected finding after right hip arthroplasty. Electronically Signed   By: Jorje Guild M.D.   On: 11/19/2020 10:01   DG C-Arm 1-60 Min-No Report  Result Date: 11/19/2020 CLINICAL DATA:  Right anterior approach total hip replacement. EXAM: OPERATIVE RIGHT HIP (WITH PELVIS IF PERFORMED) 2 VIEWS TECHNIQUE: Fluoroscopic spot image(s) were submitted for interpretation post-operatively. FLUOROSCOPY TIME:  22 seconds (0.8 mGy) COMPARISON:  Right hip radiographs-11/06/2020 FINDINGS: Two spot anterior projection fluoroscopic images of the right hip and lower pelvis are provided for review and demonstrate the sequela of right total hip replacement. Alignment appears anatomic given anterior projection. There is a minimal amount of subcutaneous emphysema about the operative site. No radiopaque foreign body. Limited visualization of the pelvis and contralateral left hip is normal. Surgical clips overlie the midline of the lower pelvis, unchanged. IMPRESSION: Post right total hip replacement without evidence of complication. Electronically Signed   By: Sandi Mariscal M.D.   On: 11/19/2020 10:44   DG HIP OPERATIVE UNILAT WITH PELVIS RIGHT  Result Date: 11/19/2020 CLINICAL DATA:  Right anterior approach total hip replacement. EXAM: OPERATIVE RIGHT HIP (WITH PELVIS IF PERFORMED) 2 VIEWS TECHNIQUE: Fluoroscopic spot image(s) were submitted for interpretation post-operatively. FLUOROSCOPY TIME:  22 seconds (0.8 mGy) COMPARISON:  Right hip radiographs-11/06/2020 FINDINGS: Two spot anterior projection fluoroscopic images of the right hip and lower pelvis are provided for review and demonstrate the sequela of right total  hip replacement. Alignment appears anatomic given anterior projection. There is a minimal amount of subcutaneous emphysema about the operative site. No radiopaque foreign body. Limited visualization of the pelvis and contralateral left hip is normal. Surgical clips overlie the midline of the lower pelvis, unchanged. IMPRESSION: Post right total hip replacement without evidence of complication. Electronically Signed   By: Sandi Mariscal M.D.   On: 11/19/2020 10:44   XR HIP UNILAT W OR W/O PELVIS 2-3 VIEWS RIGHT  Result Date: 11/06/2020 X-rays demonstrate interval collapse and fragmentation of the right femoral head with secondary degenerative joint disease.  There has been significant progression of the avascular necrosis of the femoral head.   Disposition: Discharge disposition: 01-Home or Self Care          Follow-up Information     Leandrew Koyanagi, MD. Schedule an appointment as soon as possible for a visit in 2 week(s).   Specialty: Orthopedic Surgery Contact information: [address] [place] [address] [phone]                  Signed: Aundra Dubin 11/20/2020, 8:06 AM

## 2020-11-20 NOTE — Progress Notes (Signed)
Subjective: 1 Day Post-Op Procedure(s) (LRB): RIGHT TOTAL HIP ARTHROPLASTY ANTERIOR APPROACH (Right) Patient reports pain as mild.    Objective: Vital signs in last 24 hours: Temp:  [97.3 F (36.3 C)-97.9 F (36.6 C)] 97.8 F (36.6 C) (10/18 0735) Pulse Rate:  [64-86] 84 (10/18 0735) Resp:  [10-29] 18 (10/18 0735) BP: (111-159)/(57-96) 128/81 (10/18 0735) SpO2:  [0 %-100 %] 96 % (10/18 0735)  Intake/Output from previous day: 10/17 0701 - 10/18 0700 In: 1200 [I.V.:1200] Out: 1175 [Urine:1125; Blood:50] Intake/Output this shift: No intake/output data recorded.  Recent Labs    11/20/20 0511  HGB 11.6*   Recent Labs    11/20/20 0511  WBC 12.9*  RBC 3.45*  HCT 33.8*  PLT 205   Recent Labs    11/20/20 0511  NA 136  K 3.9  CL 99  CO2 28  BUN 8  CREATININE 0.83  GLUCOSE 129*  CALCIUM 9.3   No results for input(s): LABPT, INR in the last 72 hours.  Neurologically intact Neurovascular intact Sensation intact distally Intact pulses distally Dorsiflexion/Plantar flexion intact Incision: dressing C/D/I No cellulitis present Compartment soft   Assessment/Plan: 1 Day Post-Op Procedure(s) (LRB): RIGHT TOTAL HIP ARTHROPLASTY ANTERIOR APPROACH (Right) Advance diet Up with therapy D/C IV fluids Discharge home with home health after second PT session WBAT RLE ABLA- mild and stable      Kayla Shah 11/20/2020, 8:05 AM

## 2020-11-20 NOTE — Progress Notes (Addendum)
Physical Therapy Treatment Patient Details Name: Kayla Shah MRN: [id number] DOB: 1955/10/12 Today's Date: 11/20/2020   History of Present Illness Pt is a 65 y/o female s/p R THA, direct anterior. PMH includes COPD, HTN, and seizures.    PT Comments    Pt made good progress towards her physical therapy goals during her inpatient stay. Ambulating 150 feet with a walker without physical assist; utilizing a step to pattern. Performed one step with a walker vs railing to simulate home set up. Reviewed HEP and provided written handout, in addition to education reinforced regarding generalized walking program, cryotherapy, car transfer technique. Pt verbalized understanding and has no further questions or concerns.    Recommendations for follow up therapy are one component of a multi-disciplinary discharge planning process, led by the attending physician.  Recommendations may be updated based on patient status, additional functional criteria and insurance authorization.  Follow Up Recommendations  Follow surgeon's recommendation for DC plan and follow-up therapies     Equipment Recommendations  Rolling walker with 5" wheels    Recommendations for Other Services       Precautions / Restrictions Precautions Precautions: Fall Restrictions Weight Bearing Restrictions: Yes RLE Weight Bearing: Weight bearing as tolerated     Mobility  Bed Mobility Overal bed mobility: Modified Independent                  Transfers Overall transfer level: Modified independent Equipment used: Rolling walker (2 wheeled)                Ambulation/Gait Ambulation/Gait assistance: Modified independent (Device/Increase time) Gait Distance (Feet): 150 Feet Assistive device: Rolling walker (2 wheeled) Gait Pattern/deviations: Step-to pattern;Decreased weight shift to right;Antalgic Gait velocity: Decreased   General Gait Details: Step to pattern, good left heel strike, cues for  upright posture and walker use (i.e. not picking up with every step). Walker height adjusted for comfort   Stairs Stairs: Yes Stairs assistance: Min guard Stair Management: One rail Left;With walker Number of Stairs: 2 General stair comments: Pt trialed 1 step with L rail and cues for sequencing/technique and then one step with a walker ascending backwards and descending forwards   Wheelchair Mobility    Modified Rankin (Stroke Patients Only)       Balance Overall balance assessment: Needs assistance Sitting-balance support: No upper extremity supported;Feet supported Sitting balance-Leahy Scale: Good     Standing balance support: Bilateral upper extremity supported;During functional activity Standing balance-Leahy Scale: Poor Standing balance comment: Reliant on BUE support                            Cognition Arousal/Alertness: Awake/alert Behavior During Therapy: WFL for tasks assessed/performed Overall Cognitive Status: Within Functional Limits for tasks assessed                                        Exercises Total Joint Exercises Quad Sets: Both;15 reps;Supine Heel Slides: AAROM;Right;5 reps;Supine Hip ABduction/ADduction: AROM;Right;5 reps;Supine Long Arc Quad: Right;10 reps;Seated    General Comments        Pertinent Vitals/Pain Pain Assessment: Faces Faces Pain Scale: Hurts little more Pain Location: R hip Pain Descriptors / Indicators: Grimacing;Guarding Pain Intervention(s): Monitored during session    Home Living  Prior Function            PT Goals (current goals can now be found in the care plan section) Acute Rehab PT Goals Patient Stated Goal: to go home and be independent PT Goal Formulation: With patient Time For Goal Achievement: 12/03/20 Potential to Achieve Goals: Good Progress towards PT goals: Progressing toward goals    Frequency    7X/week      PT Plan Current  plan remains appropriate    Co-evaluation              AM-PAC PT "6 Clicks" Mobility   Outcome Measure  Help needed turning from your back to your side while in a flat bed without using bedrails?: None Help needed moving from lying on your back to sitting on the side of a flat bed without using bedrails?: None Help needed moving to and from a bed to a chair (including a wheelchair)?: None Help needed standing up from a chair using your arms (e.g., wheelchair or bedside chair)?: None Help needed to walk in hospital room?: None Help needed climbing 3-5 steps with a railing? : A Little 6 Click Score: 23    End of Session Equipment Utilized During Treatment: Gait belt Activity Tolerance: Patient tolerated treatment well Patient left: in chair;with call bell/phone within reach Nurse Communication: Mobility status PT Visit Diagnosis: Other abnormalities of gait and mobility (R26.89);Pain Pain - Right/Left: Right Pain - part of body: Hip     Time: 5366-4403 PT Time Calculation (min) (ACUTE ONLY): 35 min  Charges:  $Gait Training: 23-37 mins                     Wyona Almas, PT, DPT Acute Rehabilitation Services Pager [phone] Office [place] 11/20/2020, 8:59 AM

## 2020-11-21 ENCOUNTER — Encounter (HOSPITAL_COMMUNITY): Payer: Self-pay | Admitting: Orthopaedic Surgery

## 2020-11-30 ENCOUNTER — Ambulatory Visit: Payer: Medicare Other | Admitting: Nurse Practitioner

## 2020-12-04 ENCOUNTER — Encounter: Payer: Self-pay | Admitting: Orthopaedic Surgery

## 2020-12-04 ENCOUNTER — Other Ambulatory Visit: Payer: Self-pay

## 2020-12-04 ENCOUNTER — Ambulatory Visit (INDEPENDENT_AMBULATORY_CARE_PROVIDER_SITE_OTHER): Payer: Medicare Other | Admitting: Orthopaedic Surgery

## 2020-12-04 DIAGNOSIS — Z96641 Presence of right artificial hip joint: Secondary | ICD-10-CM

## 2020-12-04 MED ORDER — OXYCODONE-ACETAMINOPHEN 5-325 MG PO TABS: 1.0000 | ORAL_TABLET | Freq: Every day | ORAL | 0 refills | Status: DC | PRN

## 2020-12-04 NOTE — Progress Notes (Signed)
Post-Op Visit Note   Patient: Tabitha Riggins [facility]           Date of Birth: 06-Sep-1955           MRN: [id number] Visit Date: 12/04/2020 PCP: Charyl Dancer, NP   Assessment & Plan:  Chief Complaint:  Chief Complaint  Patient presents with   Right Hip - Routine Post Op   Visit Diagnoses:  1. Status post total replacement of right hip     Plan: Aedyn is 2 weeks status post right total hip replacement.  Overall doing well has no real complaints.  Home health physical therapy has been really helpful.  Right hip surgical incisions intact.  No signs of infection.  Mild swelling and bruising.  No neurovascular compromise.  Sutures removed Steri-Strips applied today.  She will continue with her home exercise program.  She is to work on walking and gait training for the next month.  Dental prophylaxis reinforced.  Recheck in 4 weeks with standing AP pelvis x-rays.  Tramadol and Percocet refilled today.  Follow-Up Instructions: Return in about 4 weeks (around 01/01/2021).   Orders:  No orders of the defined types were placed in this encounter.  Meds ordered this encounter  Medications   oxyCODONE-acetaminophen (PERCOCET) 5-325 MG tablet    Sig: Take 1-2 tablets by mouth daily as needed for severe pain.    Dispense:  20 tablet    Refill:  0    Imaging: No results found.  PMFS History: Patient Active Problem List   Diagnosis Date Noted   Status post total replacement of right hip 11/19/2020   Avascular necrosis of right femoral head ([place]) 10/30/2020   Urinary frequency 09/13/2020   Cigarette nicotine dependence without complication 67/59/1638   Osteoarthritis of right hip 08/30/2020   Chronic HFrEF (heart failure with reduced ejection fraction) ([place]) 08/19/2020   Aortic atherosclerosis ([place]) 05/01/2020   Chronic bilateral low back pain without sciatica 02/17/2020   Intractable nausea and vomiting 05/01/2019   Anxiety and depression 07/30/2017   CAD (coronary artery  disease) 07/30/2017   Hot flashes due to surgical menopause 07/30/2017   Insomnia 07/30/2017   Osteoporosis 02/27/2016   Migraine 06/07/2015   Seizures ([place]) 06/07/2015   Gastroesophageal reflux disease without esophagitis 06/07/2015   COPD (chronic obstructive pulmonary disease) with chronic bronchitis ([place]) 08/14/2014   Essential (primary) hypertension 08/14/2014   Pure hypercholesterolemia 08/14/2014   Past Medical History:  Diagnosis Date   Allergy    Anxiety    agoraphobia    Arthritis    Chicken pox    COPD (chronic obstructive pulmonary disease) ([place])    Depression    Family history of adverse reaction to anesthesia    PONV   GERD (gastroesophageal reflux disease)    Headache    History of blood transfusion    1984   Hyperlipidemia    Hypertension    Migraine    PONV (postoperative nausea and vomiting)    Seizure ([place])    last seizure 04/2019    Family History  Problem Relation Age of Onset   Stroke Mother    Depression Mother    Arthritis Mother    Heart disease Father    Alcohol abuse Father    Diabetes Father    Osteoporosis Sister    Kidney disease Sister    Arthritis Sister    Dementia Sister    Arthritis Maternal Grandmother    Heart disease Maternal Grandmother    Hyperlipidemia Maternal  Grandmother    Diabetes Paternal Grandmother    Breast cancer Neg Hx     Past Surgical History:  Procedure Laterality Date   APPENDECTOMY  1984   CATARACT EXTRACTION, BILATERAL Bilateral    COLONOSCOPY WITH PROPOFOL N/A 03/26/2017   Procedure: COLONOSCOPY WITH PROPOFOL;  Surgeon: Jonathon Bellows, MD;  Location: [facility] ENDOSCOPY;  Service: Gastroenterology;  Laterality: N/A;   TOTAL ABDOMINAL HYSTERECTOMY  1984   Total   TOTAL HIP ARTHROPLASTY Right 11/19/2020   Procedure: RIGHT TOTAL HIP ARTHROPLASTY ANTERIOR APPROACH;  Surgeon: Leandrew Koyanagi, MD;  Location: [place];  Service: Orthopedics;  Laterality: Right;  C-3   VENTRAL HERNIA REPAIR N/A 09/16/2019   Procedure:  HERNIA REPAIR VENTRAL ADULT, Open;  Surgeon: Fredirick Maudlin, MD;  Location: [facility] ORS;  Service: General;  Laterality: N/A;   WISDOM TOOTH EXTRACTION     Social History   Occupational History    Comment: disabled  Tobacco Use   Smoking status: Every Day    Packs/day: 1.00    Years: 45.00    Pack years: 45.00    Types: Cigarettes   Smokeless tobacco: Never   Tobacco comments:    reports quitting in feb 2021  Vaping Use   Vaping Use: Never used  Substance and Sexual Activity   Alcohol use: No   Drug use: Yes    Types: Marijuana    Comment: every day   Sexual activity: Never

## 2020-12-10 ENCOUNTER — Other Ambulatory Visit: Payer: Self-pay | Admitting: Nurse Practitioner

## 2020-12-10 NOTE — Telephone Encounter (Signed)
Requested medications are due for refill today.  yes  Requested medications are on the active medications list.  yes  Last refill. 11/12/2020  Future visit scheduled.   yes  Notes to clinic.  Medication not delegated.

## 2020-12-13 ENCOUNTER — Telehealth: Payer: Self-pay | Admitting: Orthopaedic Surgery

## 2020-12-13 NOTE — Telephone Encounter (Signed)
Pt called stating she was doing ok with her pain levels but she thinks she moved wrong because her pain has come back. Pt would like to know if there's anything that can be called in for the pain since it's at the levels of when she first left the hospital. She stated she understands if he can't do a full rx but she needs something to help. Pt would like a CB to be updated on if this is possible.   [phone]

## 2020-12-14 ENCOUNTER — Other Ambulatory Visit: Payer: Self-pay | Admitting: Physician Assistant

## 2020-12-14 MED ORDER — OXYCODONE-ACETAMINOPHEN 5-325 MG PO TABS: 1.0000 | ORAL_TABLET | Freq: Three times a day (TID) | ORAL | 0 refills | Status: DC | PRN

## 2020-12-14 NOTE — Telephone Encounter (Signed)
Sure.  Durene Cal in

## 2020-12-18 ENCOUNTER — Telehealth: Payer: Self-pay | Admitting: Nurse Practitioner

## 2020-12-18 NOTE — Telephone Encounter (Signed)
Copied from [place] [id number]. Topic: Medicare AWV >> Dec 18, 2020  3:32 PM Kayla Shah wrote: Reason for CRM:  Left message for patient to call back and schedule the Medicare Annual Wellness Visit (AWV) virtually or by telephone.  Last AWV 11/03/12  Please schedule at anytime with [facility]-Nurse Health Advisor.  45 minute appointment  Any questions, please call me at [phone]

## 2021-01-03 ENCOUNTER — Ambulatory Visit (INDEPENDENT_AMBULATORY_CARE_PROVIDER_SITE_OTHER): Payer: Medicare Other

## 2021-01-03 ENCOUNTER — Other Ambulatory Visit: Payer: Self-pay

## 2021-01-03 ENCOUNTER — Encounter: Payer: Self-pay | Admitting: Orthopaedic Surgery

## 2021-01-03 ENCOUNTER — Ambulatory Visit: Payer: Medicare Other | Admitting: Orthopaedic Surgery

## 2021-01-03 DIAGNOSIS — Z96641 Presence of right artificial hip joint: Secondary | ICD-10-CM

## 2021-01-03 NOTE — Progress Notes (Signed)
Post-Op Visit Note   Patient: Kayla Shah [facility]           Date of Birth: [birth year]           MRN: [id number] Visit Date: 01/03/2021 PCP: Charyl Dancer, NP   Assessment & Plan:  Chief Complaint:  Chief Complaint  Patient presents with   Right Hip - Pain   Visit Diagnoses:  1. Status post total replacement of right hip     Plan: Tesa is 6 weeks status post right total hip replacement.  She is overall doing well has completed physical therapy.  She feels like she may be overdoing a little bit because she is having to take care of her sister at home who has dementia.  Overall she is very pleased with her recovery and outcome.  Right hip shows a fully healed surgical scar.  Excellent range of motion without pain.  X-rays demonstrate stable implant without any complications.  Airiana is doing well from my standpoint.  She can continue to increase activity as tolerated.  Dental prophylaxis reinforced.  Recheck in 6 weeks for 59-month visit.  Follow-Up Instructions: Return in about 6 weeks (around 02/14/2021).   Orders:  Orders Placed This Encounter  Procedures   XR Pelvis 1-2 Views   No orders of the defined types were placed in this encounter.   Imaging: XR Pelvis 1-2 Views  Result Date: 01/03/2021 Stable total hip replacement without complications   PMFS History: Patient Active Problem List   Diagnosis Date Noted   Status post total replacement of right hip 11/19/2020   Avascular necrosis of right femoral head ([place]) 10/30/2020   Urinary frequency 09/13/2020   Cigarette nicotine dependence without complication 51/70/0174   Osteoarthritis of right hip 08/30/2020   Chronic HFrEF (heart failure with reduced ejection fraction) ([place]) 08/19/2020   Aortic atherosclerosis ([place]) 05/01/2020   Chronic bilateral low back pain without sciatica 02/17/2020   Intractable nausea and vomiting 05/01/2019   Anxiety and depression 07/30/2017   CAD (coronary artery disease)  07/30/2017   Hot flashes due to surgical menopause 07/30/2017   Insomnia 07/30/2017   Osteoporosis 02/27/2016   Migraine 06/07/2015   Seizures ([place]) 06/07/2015   Gastroesophageal reflux disease without esophagitis 06/07/2015   COPD (chronic obstructive pulmonary disease) with chronic bronchitis ([place]) 08/14/2014   Essential (primary) hypertension 08/14/2014   Pure hypercholesterolemia 08/14/2014   Past Medical History:  Diagnosis Date   Allergy    Anxiety    agoraphobia    Arthritis    Chicken pox    COPD (chronic obstructive pulmonary disease) ([place])    Depression    Family history of adverse reaction to anesthesia    PONV   GERD (gastroesophageal reflux disease)    Headache    History of blood transfusion    1984   Hyperlipidemia    Hypertension    Migraine    PONV (postoperative nausea and vomiting)    Seizure ([employer])    last seizure 04/2019    Family History  Problem Relation Age of Onset   Stroke Mother    Depression Mother    Arthritis Mother    Heart disease Father    Alcohol abuse Father    Diabetes Father    Osteoporosis Sister    Kidney disease Sister    Arthritis Sister    Dementia Sister    Arthritis Maternal Grandmother    Heart disease Maternal Grandmother    Hyperlipidemia Maternal Grandmother    Diabetes  Paternal Grandmother    Breast cancer Neg Hx     Past Surgical History:  Procedure Laterality Date   APPENDECTOMY  1984   CATARACT EXTRACTION, BILATERAL Bilateral    COLONOSCOPY WITH PROPOFOL N/A 03/26/2017   Procedure: COLONOSCOPY WITH PROPOFOL;  Surgeon: Jonathon Bellows, MD;  Location[facility];  Service: Gastroenterology;  Laterality: N/A;   TOTAL ABDOMINAL HYSTERECTOMY  1984   Total   TOTAL HIP ARTHROPLASTY Right 11/19/2020   Procedure: RIGHT TOTAL HIP ARTHROPLASTY ANTERIOR APPROACH;  Surgeon: Leandrew Koyanagi, MD;  Location: [place];  Service: Orthopedics;  Laterality: Right;  C-3   VENTRAL HERNIA REPAIR N/A 09/16/2019   Procedure: HERNIA  REPAIR VENTRAL ADULT, Open;  Surgeon: Fredirick Maudlin, MD;  Location: [facility] ORS;  Service: General;  Laterality: N/A;   WISDOM TOOTH EXTRACTION     Social History   Occupational History    Comment: disabled  Tobacco Use   Smoking status: Every Day    Packs/day: 1.00    Years: 45.00    Pack years: 45.00    Types: Cigarettes   Smokeless tobacco: Never   Tobacco comments:    reports quitting in feb 2021  Vaping Use   Vaping Use: Never used  Substance and Sexual Activity   Alcohol use: No   Drug use: Yes    Types: Marijuana    Comment: every day   Sexual activity: Never

## 2021-01-30 ENCOUNTER — Other Ambulatory Visit: Payer: Self-pay | Admitting: Nurse Practitioner

## 2021-01-30 DIAGNOSIS — K219 Gastro-esophageal reflux disease without esophagitis: Secondary | ICD-10-CM

## 2021-01-30 NOTE — Telephone Encounter (Signed)
Requested medication (s) are due for refill today: yes  Requested medication (s) are on the active medication list: yes  Last refill:  12/11/20 #40 0 refills  Future visit scheduled: yes in 2 days  Notes to clinic:  not delegated per protocol     Requested Prescriptions  Pending Prescriptions Disp Refills   ondansetron (ZOFRAN) 4 MG tablet [Pharmacy Med Name: ONDANSETRON HCL 4 MG TAB] 40 tablet 0    Sig: TAKE 1 TABLET BY MOUTH EVERY 8 HOURS AS NEEDED FOR NAUSEA OR VOMITING     Not Delegated - Gastroenterology: Antiemetics Failed - 01/30/2021  9:31 AM      Failed - This refill cannot be delegated      Passed - Valid encounter within last 6 months    Recent Outpatient Visits           3 months ago Osteoarthritis of right hip, unspecified osteoarthritis type   [place], Lauren A, NP   4 months ago Urinary frequency   [place], Lauren A, NP   5 months ago Osteoarthritis of right hip, unspecified osteoarthritis type   [place], Lauren A, NP   8 months ago COPD (chronic obstructive pulmonary disease) with chronic bronchitis ([place])   [facility] [place], Coralie Keens, NP   9 months ago Avascular necrosis of right femoral head ([place])   [facility] [place], Devonne Doughty, DO       Future Appointments             In 2 days [place], Scheryl Darter, NP [place], [place]   In 2 weeks Leandrew Koyanagi, MD [place]            Signed Prescriptions Disp Refills   famotidine (PEPCID) 20 MG tablet 90 tablet 0    Sig: TAKE 1/2 TABLET (10 MG TOTAL) BY MOUTH 2TIMES DAILY     Gastroenterology:  H2 Antagonists Passed - 01/30/2021  9:31 AM      Passed - Valid encounter within last 12 months    Recent Outpatient Visits           3 months ago Osteoarthritis of right hip, unspecified osteoarthritis type   [place], Lauren A, NP   4 months ago  Urinary frequency   [place], Lauren A, NP   5 months ago Osteoarthritis of right hip, unspecified osteoarthritis type   [facility] [place], Lauren A, NP   8 months ago COPD (chronic obstructive pulmonary disease) with chronic bronchitis ([place])   [facility] [place], Coralie Keens, NP   9 months ago Avascular necrosis of right femoral head [facility])   [facility] Olin Hauser, DO       Future Appointments             In 2 days Dwan Bolt, Scheryl Darter, NP [facility], [place]   In 2 weeks Leandrew Koyanagi, MD [facility]

## 2021-01-30 NOTE — Telephone Encounter (Signed)
Requested Prescriptions  Pending Prescriptions Disp Refills   famotidine (PEPCID) 20 MG tablet [Pharmacy Med Name: FAMOTIDINE 20 MG TAB] 90 tablet 0    Sig: TAKE 1/2 TABLET (10 MG TOTAL) BY MOUTH 2TIMES DAILY     Gastroenterology:  H2 Antagonists Passed - 01/30/2021  9:31 AM      Passed - Valid encounter within last 12 months    Recent Outpatient Visits          3 months ago Osteoarthritis of right hip, unspecified osteoarthritis type   [place], Lauren A, NP   4 months ago Urinary frequency   [place], Lauren A, NP   5 months ago Osteoarthritis of right hip, unspecified osteoarthritis type   [facility] [place], Lauren A, NP   8 months ago COPD (chronic obstructive pulmonary disease) with chronic bronchitis ([place])   [facility] [place], Coralie Keens, NP   9 months ago Avascular necrosis of right femoral head ([place])   [facility] [place], Devonne Doughty, DO      Future Appointments            In 2 days [place], Scheryl Darter, NP [facility], [place]   In 2 weeks Erlinda Hong, Marylynn Pearson, MD [place]            ondansetron (ZOFRAN) 4 MG tablet [Pharmacy Med Name: ONDANSETRON HCL 4 MG TAB] 40 tablet 0    Sig: TAKE 1 TABLET BY MOUTH EVERY 8 HOURS AS NEEDED FOR NAUSEA OR VOMITING     Not Delegated - Gastroenterology: Antiemetics Failed - 01/30/2021  9:31 AM      Failed - This refill cannot be delegated      Passed - Valid encounter within last 6 months    Recent Outpatient Visits          3 months ago Osteoarthritis of right hip, unspecified osteoarthritis type   [facility] [place], Lauren A, NP   4 months ago Urinary frequency   [place], Lauren A, NP   5 months ago Osteoarthritis of right hip, unspecified osteoarthritis type   [facility] [place], Lauren A, NP   8 months ago COPD (chronic obstructive pulmonary disease) with  chronic bronchitis ([place])   [facility] [place], Coralie Keens, NP   9 months ago Avascular necrosis of right femoral head [facility])   [facility] Olin Hauser, DO      Future Appointments            In 2 days Dwan Bolt, Scheryl Darter, NP [facility], [place]   In 2 weeks Leandrew Koyanagi, MD [facility]

## 2021-01-30 NOTE — Progress Notes (Signed)
BP 135/85    Pulse 79    Temp 98.2 F (36.8 C) (Oral)    Ht 5' 5.6" (1.666 m)    Wt 133 lb 12.8 oz (60.7 kg)    SpO2 98%    BMI 21.86 kg/m    Subjective:    Patient ID: Kayla Shah, female    DOB: 12/28/1955, 65 y.o.   MRN: [id number]  CC: Chief Complaint  Patient presents with   Annual Exam   HPI: Kayla Shah is a 65 y.o. female presenting on 02/01/2021 for comprehensive medical examination. Current medical complaints include: right knee pain  KNEE PAIN  Duration: chronic Involved knee: right Mechanism of injury: unknown Location:anterior Onset: gradual Severity: 3/10  Quality:  ache Frequency: constant Radiation: no Aggravating factors: weight bearing, walking, and stairs  Alleviating factors: physical therapy, NSAIDs, and rest  Status: stable Treatments attempted: rest, ibuprofen, and physical therapy  Relief with NSAIDs?:  mild Weakness with weight bearing or walking: no Sensation of giving way: no Locking: no Popping: no Bruising: no Swelling: no Redness: no Paresthesias/decreased sensation: no Fevers: no  She currently lives with: husband Menopausal Symptoms: no  Depression Screen done today and results listed below:  Depression screen [facility] 2/9 02/01/2021 08/30/2020 05/31/2020 09/27/2019 05/26/2019  Decreased Interest 0 0 0 0 2  Down, Depressed, Hopeless 0 0 0 0 2  PHQ - 2 Score 0 0 0 0 4  Altered sleeping 0 0 0 0 0  Tired, decreased energy 0 0 0 0 0  Change in appetite 0 0 0 0 2  Feeling bad or failure about yourself  0 0 0 0 0  Trouble concentrating 0 0 0 0 0  Moving slowly or fidgety/restless 0 0 0 0 3  Suicidal thoughts 0 0 0 0 0  PHQ-9 Score 0 0 0 0 9  Difficult doing work/chores Not difficult at all - Not difficult at all - Not difficult at all  Some recent data might be hidden   GAD 7 : Generalized Anxiety Score 02/01/2021 05/31/2020 05/26/2019 09/08/2018  Nervous, Anxious, on Edge 1 0 3 2  Control/stop worrying 1 0 3 2  Worry too  much - different things 0 0 3 1  Trouble relaxing 1 3 3 1   Restless 0 3 2 1   Easily annoyed or irritable 0 0 1 1  Afraid - awful might happen 1 0 1 0  Total GAD 7 Score 4 6 16 8   Anxiety Difficulty Not difficult at all Not difficult at all Somewhat difficult Somewhat difficult     The patient does not have a history of falls. I did not complete a risk assessment for falls. A plan of care for falls was not documented.   Past Medical History:  Past Medical History:  Diagnosis Date   Allergy    Anxiety    agoraphobia    Arthritis    Chicken pox    COPD (chronic obstructive pulmonary disease) ([place])    Depression    Family history of adverse reaction to anesthesia    PONV   GERD (gastroesophageal reflux disease)    Headache    History of blood transfusion    1984   Hyperlipidemia    Hypertension    Migraine    PONV (postoperative nausea and vomiting)    Seizure ([place])    last seizure 04/2019    Surgical History:  Past Surgical History:  Procedure Laterality Date   APPENDECTOMY  1984  CATARACT EXTRACTION, BILATERAL Bilateral    COLONOSCOPY WITH PROPOFOL N/A 03/26/2017   Procedure: COLONOSCOPY WITH PROPOFOL;  Surgeon: Jonathon Bellows, MD;  Location: [facility] ENDOSCOPY;  Service: Gastroenterology;  Laterality: N/A;   TOTAL ABDOMINAL HYSTERECTOMY  1984   Total   TOTAL HIP ARTHROPLASTY Right 11/19/2020   Procedure: RIGHT TOTAL HIP ARTHROPLASTY ANTERIOR APPROACH;  Surgeon: Leandrew Koyanagi, MD;  Location: [place];  Service: Orthopedics;  Laterality: Right;  C-3   VENTRAL HERNIA REPAIR N/A 09/16/2019   Procedure: HERNIA REPAIR VENTRAL ADULT, Open;  Surgeon: Fredirick Maudlin, MD;  Location: [facility] ORS;  Service: General;  Laterality: N/A;   WISDOM TOOTH EXTRACTION      Medications:  Current Outpatient Medications on File Prior to Visit  Medication Sig   diazepam (VALIUM) 5 MG tablet Take 5 mg by mouth 3 (three) times daily as needed.   famotidine (PEPCID) 20 MG tablet TAKE 1/2 TABLET (10  MG TOTAL) BY MOUTH 2TIMES DAILY   lamoTRIgine (LAMICTAL) 100 MG tablet Take 1 tablet (100 mg total) by mouth 2 (two) times daily.   ondansetron (ZOFRAN) 4 MG tablet TAKE 1 TABLET BY MOUTH EVERY 8 HOURS AS NEEDED FOR NAUSEA OR VOMITING   pantoprazole (PROTONIX) 40 MG tablet Take 1 tablet (40 mg total) by mouth daily.   [name] 1.25 MG tablet TAKE 1 TABLET BY MOUTH DAILY   rizatriptan (MAXALT-MLT) 10 MG disintegrating tablet Take 1 tablet (10 mg total) by mouth as needed for migraine. May repeat in 2 hours if needed   simvastatin (ZOCOR) 20 MG tablet TAKE 1 TABLET BY MOUTH DAILY AT 6 PM   verapamil (CALAN-SR) 240 MG CR tablet Take 1 tablet (240 mg total) by mouth daily.   vitamin B-12 (CYANOCOBALAMIN) 500 MCG tablet Take 500 mcg by mouth daily.   No current facility-administered medications on file prior to visit.    Allergies:  Allergies  Allergen Reactions   Codeine Nausea And Vomiting   Other     No Anti Depressants - "makes me crazy"   Fluoxetine Anxiety   Paroxetine Anxiety    Becomes very aggressive with mood swings.    Social History:  Social History   Socioeconomic History   Marital status: Divorced    Spouse name: Legrand Como   Number of children: 0   Years of education: 12   Highest education level: Not on file  Occupational History    Comment: disabled  Tobacco Use   Smoking status: Every Day    Packs/day: 1.00    Years: 45.00    Pack years: 45.00    Types: Cigarettes   Smokeless tobacco: Never   Tobacco comments:    reports quitting in feb 2021  Vaping Use   Vaping Use: Never used  Substance and Sexual Activity   Alcohol use: No   Drug use: Yes    Types: Marijuana    Comment: every day   Sexual activity: Never  Other Topics Concern   Not on file  Social History Narrative   Disabled    Married no kids    No guns, wears seat belt, feels safe in relationship    12th grade ed.    Social Determinants of Health   Financial Resource Strain: Not on file   Food Insecurity: Not on file  Transportation Needs: Not on file  Physical Activity: Not on file  Stress: Not on file  Social Connections: Not on file  Intimate Partner Violence: Not on file   Social History  Tobacco Use  Smoking Status Every Day   Packs/day: 1.00   Years: 45.00   Pack years: 45.00   Types: Cigarettes  Smokeless Tobacco Never  Tobacco Comments   reports quitting in feb 2021   Social History   Substance and Sexual Activity  Alcohol Use No    Family History:  Family History  Problem Relation Age of Onset   Stroke Mother    Depression Mother    Arthritis Mother    Heart disease Father    Alcohol abuse Father    Diabetes Father    Osteoporosis Sister    Kidney disease Sister    Arthritis Sister    Dementia Sister    Arthritis Maternal Grandmother    Heart disease Maternal Grandmother    Hyperlipidemia Maternal Grandmother    Diabetes Paternal Grandmother    Breast cancer Neg Hx     Past medical history, surgical history, medications, allergies, family history and social history reviewed with patient today and changes made to appropriate areas of the chart.   Review of Systems  Constitutional:  Positive for malaise/fatigue.  HENT: Negative.    Eyes: Negative.   Respiratory: Negative.    Cardiovascular: Negative.   Gastrointestinal: Negative.   Genitourinary: Negative.   Musculoskeletal:  Positive for joint pain (right knee).  Skin: Negative.   Neurological: Negative.   Endo/Heme/Allergies: Negative.   Psychiatric/Behavioral: Negative.    All other ROS negative except what is listed above and in the HPI.      Objective:    BP 135/85    Pulse 79    Temp 98.2 F (36.8 C) (Oral)    Ht 5' 5.6" (1.666 m)    Wt 133 lb 12.8 oz (60.7 kg)    SpO2 98%    BMI 21.86 kg/m   Wt Readings from Last 3 Encounters:  02/01/21 133 lb 12.8 oz (60.7 kg)  11/19/20 137 lb (62.1 kg)  11/15/20 137 lb 3.2 oz (62.2 kg)    Physical Exam Vitals and nursing note  reviewed.  Constitutional:      General: She is not in acute distress.    Appearance: Normal appearance.  HENT:     Head: Normocephalic and atraumatic.     Right Ear: Tympanic membrane, ear canal and external ear normal.     Left Ear: Tympanic membrane, ear canal and external ear normal.     Nose: Nose normal.     Mouth/Throat:     Mouth: Mucous membranes are moist.     Pharynx: Oropharynx is clear.  Eyes:     Conjunctiva/sclera: Conjunctivae normal.  Cardiovascular:     Rate and Rhythm: Normal rate and regular rhythm.     Pulses: Normal pulses.     Heart sounds: Normal heart sounds.  Pulmonary:     Effort: Pulmonary effort is normal.     Breath sounds: Normal breath sounds.  Abdominal:     General: Bowel sounds are normal.     Palpations: Abdomen is soft.     Tenderness: There is no abdominal tenderness.  Musculoskeletal:        General: Tenderness (right knee) present. Normal range of motion.     Cervical back: Normal range of motion.  Skin:    General: Skin is warm and dry.  Neurological:     General: No focal deficit present.     Mental Status: She is alert and oriented to person, place, and time.     Cranial Nerves: No cranial  nerve deficit.     Coordination: Coordination normal.     Gait: Gait normal.  Psychiatric:        Mood and Affect: Mood normal.        Behavior: Behavior normal.        Thought Content: Thought content normal.        Judgment: Judgment normal.    Results for orders placed or performed during the hospital encounter of 11/19/20  CBC  Result Value Ref Range   WBC 12.9 (H) 4.0 - 10.5 K/uL   RBC 3.45 (L) 3.87 - 5.11 MIL/uL   Hemoglobin 11.6 (L) 12.0 - 15.0 g/dL   HCT 33.8 (L) 36.0 - 46.0 %   MCV 98.0 80.0 - 100.0 fL   MCH 33.6 26.0 - 34.0 pg   MCHC 34.3 30.0 - 36.0 g/dL   RDW 12.6 11.5 - 15.5 %   Platelets 205 150 - 400 K/uL   nRBC 0.0 0.0 - 0.2 %  Basic metabolic panel  Result Value Ref Range   Sodium 136 135 - 145 mmol/L   Potassium  3.9 3.5 - 5.1 mmol/L   Chloride 99 98 - 111 mmol/L   CO2 28 22 - 32 mmol/L   Glucose, Bld 129 (H) 70 - 99 mg/dL   BUN 8 8 - 23 mg/dL   Creatinine, Ser 0.83 0.44 - 1.00 mg/dL   Calcium 9.3 8.9 - 10.3 mg/dL   GFR, Estimated >60 >60 mL/min   Anion gap 9 5 - 15      Assessment & Plan:   Problem List Items Addressed This Visit       Cardiovascular and Mediastinum   Essential (primary) hypertension    Chronic, stable. Continue current regimen. Check CMP, CBC today. Follow up in 6 months.       Relevant Orders   CBC with Differential/Platelet   Comprehensive metabolic panel   Aortic atherosclerosis ([place]) - Primary    Noted on imaging. Continue zocor. Check lipid panel today.       Relevant Orders   Lipid Panel w/o Chol/HDL Ratio   Chronic HFrEF (heart failure with reduced ejection fraction) (HCC)    Chronic, stable. Euvolemic on exam. Continue current regimen. Follow up in 6 months.         Respiratory   COPD (chronic obstructive pulmonary disease) with chronic bronchitis (HCC)    Chronic, stable. Continue current regimen. Encouraged tobacco cessation. Follow up in 6 months.         Digestive   Gastroesophageal reflux disease without esophagitis    Chronic, stable. Continue current regimen. Follow up in 6 months.         Musculoskeletal and Integument   Osteoporosis    Noted on dexa scan in 2018. She has not taken any medication. Will get a new dexa scan and treat based on results.       Relevant Orders   DG Bone Density     Other   Pure hypercholesterolemia    Check lipid panel and adjust regimen as needed.       Relevant Orders   Lipid Panel w/o Chol/HDL Ratio   Seizures (HCC)    Chronic, stable. Follows with neurology. Has not had seizures in many years. Continue collaboration with neurology.       Anxiety and depression    Chronic, stable. Follows with psychiatry. Continue collaboration.       Relevant Medications   diazepam (VALIUM) 5 MG tablet    Chronic  bilateral low back pain without sciatica    Chronic, stable. Continue gabapentin. Follow up with any concerns.       Cigarette nicotine dependence without complication    Discussed complete tobacco cessation. She is not interested at this time.       Status post total replacement of right hip    Healing well after surgery. Her hip pain has improved significantly and is able to walk with a cane. She has finished physical therapy and is moving well. Denies any falls. Continue any follow up recommended by orthopedics.       Vitamin B12 deficiency    She has started the OTC supplement, will recheck B12 levels and treat based on results.       Relevant Orders   Vitamin B12   Chronic pain of right knee    Most likely due to abnormal gait with hip avascular necrosis for the last year. Daily stretches given to patient. Continue OTC meds and gabapentin. F/U if not improving or worsening pain.       Other Visit Diagnoses     Routine general medical examination at a health care facility       Health maintenance reviewed and updated. check CMP, CBC, lipid today.    Encounter for screening mammogram for malignant neoplasm of breast       Mammogram ordered today.    Relevant Orders   MM 3D SCREEN BREAST BILATERAL        Follow up plan: Return in about 6 months (around 08/02/2021) for htn, migraine, hld with Dr. Neomia Dear.   LABORATORY TESTING:  - Pap smear: not applicable  IMMUNIZATIONS:   - Tdap: Tetanus vaccination status reviewed: declined. - Influenza: Refused - Pneumovax: Not applicable - Prevnar: Not applicable - HPV: Not applicable - Zostavax vaccine: Refused  SCREENING: -Mammogram: Ordered today  - Colonoscopy: Up to date  - Bone Density: Ordered today  -Hearing Test: Not applicable  -Spirometry: Not applicable   PATIENT COUNSELING:   Advised to take 1 mg of folate supplement per day if capable of pregnancy.   Sexuality: Discussed sexually transmitted diseases,  partner selection, use of condoms, avoidance of unintended pregnancy  and contraceptive alternatives.   Advised to avoid cigarette smoking.  I discussed with the patient that most people either abstain from alcohol or drink within safe limits (<=14/week and <=4 drinks/occasion for males, <=7/weeks and <= 3 drinks/occasion for females) and that the risk for alcohol disorders and other health effects rises proportionally with the number of drinks per week and how often a drinker exceeds daily limits.  Discussed cessation/primary prevention of drug use and availability of treatment for abuse.   Diet: Encouraged to adjust caloric intake to maintain  or achieve ideal body weight, to reduce intake of dietary saturated fat and total fat, to limit sodium intake by avoiding high sodium foods and not adding table salt, and to maintain adequate dietary potassium and calcium preferably from fresh fruits, vegetables, and low-fat dairy products.    stressed the importance of regular exercise  Injury prevention: Discussed safety belts, safety helmets, smoke detector, smoking near bedding or upholstery.   Dental health: Discussed importance of regular tooth brushing, flossing, and dental visits.    NEXT PREVENTATIVE PHYSICAL DUE IN 1 YEAR. Return in about 6 months (around 08/02/2021) for htn, migraine, hld with Dr. Neomia Dear.

## 2021-02-01 ENCOUNTER — Other Ambulatory Visit: Payer: Self-pay

## 2021-02-01 ENCOUNTER — Encounter: Payer: Self-pay | Admitting: Nurse Practitioner

## 2021-02-01 ENCOUNTER — Ambulatory Visit (INDEPENDENT_AMBULATORY_CARE_PROVIDER_SITE_OTHER): Payer: Medicare Other | Admitting: Nurse Practitioner

## 2021-02-01 VITALS — BP 135/85 | HR 79 | Temp 98.2°F | Ht 65.6 in | Wt 133.8 lb

## 2021-02-01 DIAGNOSIS — M545 Low back pain, unspecified: Secondary | ICD-10-CM

## 2021-02-01 DIAGNOSIS — J449 Chronic obstructive pulmonary disease, unspecified: Secondary | ICD-10-CM

## 2021-02-01 DIAGNOSIS — F32A Depression, unspecified: Secondary | ICD-10-CM

## 2021-02-01 DIAGNOSIS — M25561 Pain in right knee: Secondary | ICD-10-CM

## 2021-02-01 DIAGNOSIS — I10 Essential (primary) hypertension: Secondary | ICD-10-CM

## 2021-02-01 DIAGNOSIS — F419 Anxiety disorder, unspecified: Secondary | ICD-10-CM

## 2021-02-01 DIAGNOSIS — E7800 Pure hypercholesterolemia, unspecified: Secondary | ICD-10-CM

## 2021-02-01 DIAGNOSIS — I5022 Chronic systolic (congestive) heart failure: Secondary | ICD-10-CM | POA: Diagnosis not present

## 2021-02-01 DIAGNOSIS — Z0000 Encounter for general adult medical examination without abnormal findings: Secondary | ICD-10-CM

## 2021-02-01 DIAGNOSIS — I700 Atherosclerosis of aorta: Secondary | ICD-10-CM

## 2021-02-01 DIAGNOSIS — E538 Deficiency of other specified B group vitamins: Secondary | ICD-10-CM | POA: Diagnosis not present

## 2021-02-01 DIAGNOSIS — K219 Gastro-esophageal reflux disease without esophagitis: Secondary | ICD-10-CM

## 2021-02-01 DIAGNOSIS — Z1231 Encounter for screening mammogram for malignant neoplasm of breast: Secondary | ICD-10-CM

## 2021-02-01 DIAGNOSIS — F17210 Nicotine dependence, cigarettes, uncomplicated: Secondary | ICD-10-CM

## 2021-02-01 DIAGNOSIS — G8929 Other chronic pain: Secondary | ICD-10-CM | POA: Insufficient documentation

## 2021-02-01 DIAGNOSIS — M810 Age-related osteoporosis without current pathological fracture: Secondary | ICD-10-CM

## 2021-02-01 DIAGNOSIS — Z96641 Presence of right artificial hip joint: Secondary | ICD-10-CM

## 2021-02-01 DIAGNOSIS — R569 Unspecified convulsions: Secondary | ICD-10-CM

## 2021-02-01 NOTE — Assessment & Plan Note (Signed)
Chronic, stable. Continue current regimen. Check CMP, CBC today. Follow up in 6 months.

## 2021-02-01 NOTE — Assessment & Plan Note (Signed)
Noted on imaging. Continue zocor. Check lipid panel today.

## 2021-02-01 NOTE — Assessment & Plan Note (Signed)
Noted on dexa scan in 2018. She has not taken any medication. Will get a new dexa scan and treat based on results.

## 2021-02-01 NOTE — Assessment & Plan Note (Signed)
Chronic, stable. Continue gabapentin. Follow up with any concerns.

## 2021-02-01 NOTE — Assessment & Plan Note (Signed)
Chronic, stable. Continue current regimen. Follow up in 6 months.

## 2021-02-01 NOTE — Assessment & Plan Note (Signed)
She has started the OTC supplement, will recheck B12 levels and treat based on results.

## 2021-02-01 NOTE — Assessment & Plan Note (Signed)
Most likely due to abnormal gait with hip avascular necrosis for the last year. Daily stretches given to patient. Continue OTC meds and gabapentin. F/U if not improving or worsening pain.

## 2021-02-01 NOTE — Assessment & Plan Note (Signed)
Discussed complete tobacco cessation. She is not interested at this time.

## 2021-02-01 NOTE — Patient Instructions (Signed)
[facility] at [facility]  Address: [address] [place], [place], [place] [address]  Phone: [phone]  Call for mammogram and dexa scan

## 2021-02-01 NOTE — Assessment & Plan Note (Signed)
Chronic, stable. Continue current regimen. Encouraged tobacco cessation. Follow up in 6 months.

## 2021-02-01 NOTE — Assessment & Plan Note (Signed)
Chronic, stable. Euvolemic on exam. Continue current regimen. Follow up in 6 months.

## 2021-02-01 NOTE — Assessment & Plan Note (Signed)
Healing well after surgery. Her hip pain has improved significantly and is able to walk with a cane. She has finished physical therapy and is moving well. Denies any falls. Continue any follow up recommended by orthopedics.

## 2021-02-01 NOTE — Assessment & Plan Note (Signed)
Chronic, stable. Follows with neurology. Has not had seizures in many years. Continue collaboration with neurology.

## 2021-02-01 NOTE — Assessment & Plan Note (Signed)
Check lipid panel and adjust regimen as needed.

## 2021-02-01 NOTE — Assessment & Plan Note (Signed)
Chronic, stable. Follows with psychiatry. Continue collaboration.

## 2021-02-02 LAB — COMPREHENSIVE METABOLIC PANEL
ALT: 7 IU/L (ref 0–32)
AST: 13 IU/L (ref 0–40)
Albumin/Globulin Ratio: 1.6 (ref 1.2–2.2)
Albumin: 4.6 g/dL (ref 3.8–4.8)
Alkaline Phosphatase: 88 IU/L (ref 44–121)
BUN/Creatinine Ratio: 10 — ABNORMAL LOW (ref 12–28)
BUN: 9 mg/dL (ref 8–27)
Bilirubin Total: <0.2 mg/dL (ref 0.0–1.2)
CO2: 26 mmol/L (ref 20–29)
Calcium: 9.5 mg/dL (ref 8.7–10.3)
Chloride: 101 mmol/L (ref 96–106)
Creatinine, Ser: 0.87 mg/dL (ref 0.57–1.00)
Globulin, Total: 2.8 g/dL (ref 1.5–4.5)
Glucose: 95 mg/dL (ref 70–99)
Potassium: 4.7 mmol/L (ref 3.5–5.2)
Sodium: 140 mmol/L (ref 134–144)
Total Protein: 7.4 g/dL (ref 6.0–8.5)
eGFR: 74 mL/min/{1.73_m2} (ref >59)

## 2021-02-02 LAB — CBC WITH DIFFERENTIAL/PLATELET
Basophils Absolute: 0 10*3/uL (ref 0.0–0.2)
Basos: 0 %
EOS (ABSOLUTE): 0 10*3/uL (ref 0.0–0.4)
Eos: 1 %
Hematocrit: 42.4 % (ref 34.0–46.6)
Hemoglobin: 14.1 g/dL (ref 11.1–15.9)
Immature Grans (Abs): 0 10*3/uL (ref 0.0–0.1)
Immature Granulocytes: 0 %
Lymphocytes Absolute: 1.6 10*3/uL (ref 0.7–3.1)
Lymphs: 21 %
MCH: 32.5 pg (ref 26.6–33.0)
MCHC: 33.3 g/dL (ref 31.5–35.7)
MCV: 98 fL — ABNORMAL HIGH (ref 79–97)
Monocytes Absolute: 0.4 10*3/uL (ref 0.1–0.9)
Monocytes: 5 %
Neutrophils Absolute: 5.4 10*3/uL (ref 1.4–7.0)
Neutrophils: 73 %
Platelets: 242 10*3/uL (ref 150–450)
RBC: 4.34 x10E6/uL (ref 3.77–5.28)
RDW: 12.1 % (ref 11.7–15.4)
WBC: 7.5 10*3/uL (ref 3.4–10.8)

## 2021-02-02 LAB — LIPID PANEL W/O CHOL/HDL RATIO
Cholesterol, Total: 239 mg/dL — ABNORMAL HIGH (ref 100–199)
HDL: 81 mg/dL (ref >39)
LDL Chol Calc (NIH): 138 mg/dL — ABNORMAL HIGH (ref 0–99)
Triglycerides: 115 mg/dL (ref 0–149)
VLDL Cholesterol Cal: 20 mg/dL (ref 5–40)

## 2021-02-02 LAB — VITAMIN B12: Vitamin B-12: 1202 pg/mL (ref 232–1245)

## 2021-02-05 ENCOUNTER — Ambulatory Visit: Payer: Self-pay

## 2021-02-05 NOTE — Telephone Encounter (Signed)
Pt called, left VM to call office back to get lab results  Summary: lab results   Pt called for lab results/ crm in chart for [facility] to give result  / please advise

## 2021-02-11 ENCOUNTER — Ambulatory Visit: Payer: Self-pay | Admitting: *Deleted

## 2021-02-11 NOTE — Telephone Encounter (Signed)
Reason for Disposition  Health Information question, no triage required and triager able to answer question  Answer Assessment - Initial Assessment Questions 1. REASON FOR CALL: "What is the main reason for your call?     Pt called in and was given her lab results from 02/01/2021.   I let her know a letter with these results were mailed out on 02/07/2021.   I read her the information from the letter.   She thanked me for my help. 2. SYMPTOMS: "Does your child have any symptoms?"      N/A 3. OTHER QUESTIONS: "Do you have any other questions?"     No other questions.    She will work on her diet with the fried foods and will continue taking the B12 supplements.  - Author's note: IAQ's are intended for training purposes and not meant to be required on every  call.  Protocols used: Information Only Call - No Triage-P-AH

## 2021-02-14 ENCOUNTER — Ambulatory Visit: Payer: Medicare Other | Admitting: Orthopaedic Surgery

## 2021-02-18 ENCOUNTER — Other Ambulatory Visit: Payer: Self-pay | Admitting: Internal Medicine

## 2021-02-18 ENCOUNTER — Ambulatory Visit (INDEPENDENT_AMBULATORY_CARE_PROVIDER_SITE_OTHER): Payer: Medicare Other | Admitting: *Deleted

## 2021-02-18 DIAGNOSIS — Z0000 Encounter for general adult medical examination without abnormal findings: Secondary | ICD-10-CM

## 2021-02-18 DIAGNOSIS — K219 Gastro-esophageal reflux disease without esophagitis: Secondary | ICD-10-CM

## 2021-02-18 NOTE — Telephone Encounter (Signed)
Requested Prescriptions  Pending Prescriptions Disp Refills   pantoprazole (PROTONIX) 40 MG tablet [Pharmacy Med Name: PANTOPRAZOLE SODIUM 40 MG DR TAB] 180 tablet     Sig: TAKE 1 TABLET BY MOUTH TWICE A DAY     Gastroenterology: Proton Pump Inhibitors Passed - 02/18/2021  9:13 AM      Passed - Valid encounter within last 12 months    Recent Outpatient Visits          2 weeks ago Aortic atherosclerosis ([place])   [place], Lauren A, NP   3 months ago Osteoarthritis of right hip, unspecified osteoarthritis type   [facility] [facility], Lauren A, NP   5 months ago Urinary frequency   [place], Lauren A, NP   5 months ago Osteoarthritis of right hip, unspecified osteoarthritis type   [place], Lauren A, NP   8 months ago COPD (chronic obstructive pulmonary disease) with chronic bronchitis ([place])   [facility], Coralie Keens, NP      Future Appointments            In 4 days Erlinda Hong, Marylynn Pearson, MD [place]   In 5 months Vigg, Avanti, MD [facility], [facility]

## 2021-02-18 NOTE — Progress Notes (Signed)
Subjective:   Kayla Shah is a 66 y.o. female who presents for Medicare Annual (Subsequent) preventive examination.  I connected with  Marleny Faller on 02/18/21 by a telephone enabled telemedicine application and verified that I am speaking with the correct person using two identifiers.   I discussed the limitations of evaluation and management by telemedicine. The patient expressed understanding and agreed to proceed.  Patient location: home  Provider location: Tele-Health  not in office    Review of Systems     Cardiac Risk Factors include: advanced age (>25men, >30 women);hypertension     Objective:    Today's Vitals   There is no height or weight on file to calculate BMI.  Advanced Directives 02/18/2021 11/15/2020 03/07/2020 09/16/2019 09/12/2019 05/01/2019 05/01/2019  Does Patient Have a Medical Advance Directive? No No No No No No No  Would patient like information on creating a medical advance directive? No - Patient declined No - Patient declined - No - Patient declined No - Patient declined No - Patient declined No - Patient declined  Some encounter information is confidential and restricted. Go to Review Flowsheets activity to see all data.    Current Medications (verified) Outpatient Encounter Medications as of 02/18/2021  Medication Sig   diazepam (VALIUM) 5 MG tablet Take 5 mg by mouth 3 (three) times daily as needed.   famotidine (PEPCID) 20 MG tablet TAKE 1/2 TABLET (10 MG TOTAL) BY MOUTH 2TIMES DAILY   lamoTRIgine (LAMICTAL) 100 MG tablet Take 1 tablet (100 mg total) by mouth 2 (two) times daily.   ondansetron (ZOFRAN) 4 MG tablet TAKE 1 TABLET BY MOUTH EVERY 8 HOURS AS NEEDED FOR NAUSEA OR VOMITING   pantoprazole (PROTONIX) 40 MG tablet Take 1 tablet (40 mg total) by mouth daily.   [name] 1.25 MG tablet TAKE 1 TABLET BY MOUTH DAILY   rizatriptan (MAXALT-MLT) 10 MG disintegrating tablet Take 1 tablet (10 mg total) by mouth as needed for migraine. May  repeat in 2 hours if needed   simvastatin (ZOCOR) 20 MG tablet TAKE 1 TABLET BY MOUTH DAILY AT 6 PM   verapamil (CALAN-SR) 240 MG CR tablet Take 1 tablet (240 mg total) by mouth daily.   vitamin B-12 (CYANOCOBALAMIN) 500 MCG tablet Take 500 mcg by mouth daily.   No facility-administered encounter medications on file as of 02/18/2021.    Allergies (verified) Codeine, Other, Fluoxetine, and Paroxetine   History: Past Medical History:  Diagnosis Date   Allergy    Anxiety    agoraphobia    Arthritis    Chicken pox    COPD (chronic obstructive pulmonary disease) (HCC)    Depression    Family history of adverse reaction to anesthesia    PONV   GERD (gastroesophageal reflux disease)    Headache    History of blood transfusion    1984   Hyperlipidemia    Hypertension    Migraine    PONV (postoperative nausea and vomiting)    Seizure ([place])    last seizure 04/2019   Past Surgical History:  Procedure Laterality Date   APPENDECTOMY  1984   CATARACT EXTRACTION, BILATERAL Bilateral    COLONOSCOPY WITH PROPOFOL N/A 03/26/2017   Procedure: COLONOSCOPY WITH PROPOFOL;  Surgeon: Jonathon Bellows, MD;  Location: [facility] ENDOSCOPY;  Service: Gastroenterology;  Laterality: N/A;   TOTAL ABDOMINAL HYSTERECTOMY  1984   Total   TOTAL HIP ARTHROPLASTY Right 11/19/2020   Procedure: RIGHT TOTAL HIP ARTHROPLASTY ANTERIOR APPROACH;  Surgeon: Erlinda Hong,  Marylynn Pearson, MD;  Location: [place];  Service: Orthopedics;  Laterality: Right;  C-3   VENTRAL HERNIA REPAIR N/A 09/16/2019   Procedure: HERNIA REPAIR VENTRAL ADULT, Open;  Surgeon: Fredirick Maudlin, MD;  Location: [facility] ORS;  Service: General;  Laterality: N/A;   WISDOM TOOTH EXTRACTION     Family History  Problem Relation Age of Onset   Stroke Mother    Depression Mother    Arthritis Mother    Heart disease Father    Alcohol abuse Father    Diabetes Father    Osteoporosis Sister    Kidney disease Sister    Arthritis Sister    Dementia Sister    Arthritis  Maternal Grandmother    Heart disease Maternal Grandmother    Hyperlipidemia Maternal Grandmother    Diabetes Paternal Grandmother    Breast cancer Neg Hx    Social History   Socioeconomic History   Marital status: Divorced    Spouse name: Legrand Como   Number of children: 0   Years of education: 12   Highest education level: Not on file  Occupational History    Comment: disabled  Tobacco Use   Smoking status: Every Day    Packs/day: 1.00    Years: 45.00    Pack years: 45.00    Types: Cigarettes   Smokeless tobacco: Never   Tobacco comments:    reports quitting in feb 2021  Vaping Use   Vaping Use: Never used  Substance and Sexual Activity   Alcohol use: No   Drug use: Yes    Types: Marijuana    Comment: every day   Sexual activity: Never  Other Topics Concern   Not on file  Social History Narrative   Disabled    Married no kids    No guns, wears seat belt, feels safe in relationship    12th grade ed.    Social Determinants of Health   Financial Resource Strain: Low Risk    Difficulty of Paying Living Expenses: Not hard at all  Food Insecurity: No Food Insecurity   Worried About Charity fundraiser in the Last Year: Never true   [place] in the Last Year: Never true  Transportation Needs: No Transportation Needs   Lack of Transportation (Medical): No   Lack of Transportation (Non-Medical): No  Physical Activity: Sufficiently Active   Days of Exercise per Week: 4 days   Minutes of Exercise per Session: 40 min  Stress: No Stress Concern Present   Feeling of Stress : Not at all  Social Connections: Socially Isolated   Frequency of Communication with Friends and Family: More than three times a week   Frequency of Social Gatherings with Friends and Family: Three times a week   Attends Religious Services: Never   Active Member of Clubs or Organizations: No   Attends Archivist Meetings: Never   Marital Status: Divorced    Tobacco  Counseling Ready to quit: Not Answered Counseling given: Not Answered Tobacco comments: reports quitting in feb 2021   Clinical Intake:  Pre-visit preparation completed: Yes  Pain : No/denies pain     Nutritional Risks: None Diabetes: No  How often do you need to have someone help you when you read instructions, pamphlets, or other written materials from your doctor or pharmacy?: 1 - Never  Diabetic?  no  Interpreter Needed?: No  Information entered by :: Leroy Kennedy LPN   Activities of Daily Living In your present state of  health, do you have any difficulty performing the following activities: 02/18/2021 11/15/2020  Hearing? N N  Vision? N N  Difficulty concentrating or making decisions? N N  Walking or climbing stairs? N Y  Dressing or bathing? N N  Doing errands, shopping? N N  Preparing Food and eating ? N -  Using the Toilet? N -  In the past six months, have you accidently leaked urine? N -  Do you have problems with loss of bowel control? N -  Managing your Medications? N -  Managing your Finances? N -  Housekeeping or managing your Housekeeping? N -  Some recent data might be hidden    Patient Care Team: Charlynne Cousins, MD as PCP - General (Internal Medicine)  Indicate any recent Medical Services you may have received from other than [facility] providers in the past year (date may be approximate).     Assessment:   This is a routine wellness examination for Ajla.  Hearing/Vision screen Hearing Screening - Comments:: No trouble hearing Vision Screening - Comments:: [facility] Not up to date  Dietary issues and exercise activities discussed: Current Exercise Habits: Home exercise routine, Type of exercise: walking, Time (Minutes): 40, Frequency (Times/Week): 4, Weekly Exercise (Minutes/Week): 160, Intensity: Mild   Goals Addressed             This Visit's Progress    Patient Stated       Continue with current lifestyle       Depression  Screen PHQ 2/9 Scores 02/18/2021 02/01/2021 08/30/2020 05/31/2020 09/27/2019 05/26/2019 09/08/2018  PHQ - 2 Score 0 0 0 0 0 4 0  PHQ- 9 Score 0 0 0 0 0 9 1    Fall Risk Fall Risk  02/01/2021 08/30/2020 05/31/2020 10/13/2019 03/10/2018  Falls in the past year? 0 1 1 0 0  Number falls in past yr: 0 1 1 - -  Injury with Fall? 0 1 1 - -  Risk for fall due to : No Fall Risks History of fall(s);Impaired balance/gait Impaired balance/gait;Impaired mobility;Orthopedic patient - -  Risk for fall due to: Comment - - - - -  Follow up Falls evaluation completed Falls evaluation completed Falls evaluation completed - Falls evaluation completed    [place]:  Any stairs in or around the home? No  If so, are there any without handrails? No  Home free of loose throw rugs in walkways, pet beds, electrical cords, etc? Yes  Adequate lighting in your home to reduce risk of falls? Yes   ASSISTIVE DEVICES UTILIZED TO PREVENT FALLS:  Life alert? No  Use of a cane, walker or w/c? Yes  Grab bars in the bathroom? No  Shower chair or bench in shower? Yes  Elevated toilet seat or a handicapped toilet? Yes   TIMED UP AND GO:  Was the test performed? No .    Cognitive Function:  Normal cognitive status assessed by direct observation by this Nurse Health Advisor. No abnormalities found.          Immunizations  There is no immunization history on file for this patient.  TDAP status: Due, Education has been provided regarding the importance of this vaccine. Advised may receive this vaccine at local pharmacy or Health Dept. Aware to provide a copy of the vaccination record if obtained from local pharmacy or Health Dept. Verbalized acceptance and understanding.  Flu Vaccine status: Declined, Education has been provided regarding the importance of this vaccine  but patient still declined. Advised may receive this vaccine at local pharmacy or Health Dept. Aware to provide a copy of the  vaccination record if obtained from local pharmacy or Health Dept. Verbalized acceptance and understanding.  Pneumococcal vaccine status: Declined,  Education has been provided regarding the importance of this vaccine but patient still declined. Advised may receive this vaccine at local pharmacy or Health Dept. Aware to provide a copy of the vaccination record if obtained from local pharmacy or Health Dept. Verbalized acceptance and understanding.   Covid-19 vaccine status: Declined, Education has been provided regarding the importance of this vaccine but patient still declined. Advised may receive this vaccine at local pharmacy or Health Dept.or vaccine clinic. Aware to provide a copy of the vaccination record if obtained from local pharmacy or Health Dept. Verbalized acceptance and understanding.  Qualifies for Shingles Vaccine? Yes   Zostavax completed No   Shingrix Completed?: No.    Education has been provided regarding the importance of this vaccine. Patient has been advised to call insurance company to determine out of pocket expense if they have not yet received this vaccine. Advised may also receive vaccine at local pharmacy or Health Dept. Verbalized acceptance and understanding.  Screening Tests Health Maintenance  Topic Date Due   Zoster Vaccines- Shingrix (1 of 2) 05/02/2021 (Originally 06/20/2006)   Pneumococcal Vaccine 68-14 Years old (1 - PCV) 02/01/2022 (Originally [birth year]20/1962)   TETANUS/TDAP  02/01/2022 (Originally 06/20/1975)   MAMMOGRAM  12/19/2021   COLONOSCOPY (Pts 45-38yrs Insurance coverage will need to be confirmed)  03/26/2022   Hepatitis C Screening  Completed   HIV Screening  Completed   HPV VACCINES  Aged Out   INFLUENZA VACCINE  Discontinued   PAP SMEAR-Modifier  Discontinued   COVID-19 Vaccine  Discontinued    Health Maintenance  There are no preventive care reminders to display for this patient.  Colorectal cancer screening: Type of screening: Colonoscopy.  Completed 2019. Repeat every 5 years  Mammogram status: Ordered  . Pt provided with contact info and advised to call to schedule appt.   Bone Density status: Ordered  . Pt provided with contact info and advised to call to schedule appt.  Lung Cancer Screening: (Low Dose CT Chest recommended if Age 46-80 years, 30 pack-year currently smoking OR have quit w/in 15years.) does not qualify.   Lung Cancer Screening Referral:   Additional Screening:  Hepatitis C Screening: does qualify;   Vision Screening: Recommended annual ophthalmology exams for early detection of glaucoma and other disord ers of the eye. Is the patient up to date with their annual eye exam?  No  Who is the provider or what is the name of the office in which the patient attends annual eye exams? [facility] If pt is not established with a provider, would they like to be referred to a provider to establish care? No .   Dental Screening: Recommended annual dental exams for proper oral hygiene  Community Resource Referral / Chronic Care Management: CRR required this visit?  No   CCM required this visit?  No      Plan:     I have personally reviewed and noted the following in the patients chart:   Medical and social history Use of alcohol, tobacco or illicit drugs  Current medications and supplements including opioid prescriptions.  Functional ability and status Nutritional status Physical activity Advanced directives List of other physicians Hospitalizations, surgeries, and ER visits in previous 12 months Vitals Screenings to  include cognitive, depression, and falls Referrals and appointments  In addition, I have reviewed and discussed with patient certain preventive protocols, quality metrics, and best practice recommendations. A written personalized care plan for preventive services as well as general preventive health recommendations were provided to patient.     Leroy Kennedy, LPN   2/84/0698   Nurse  Notes:

## 2021-02-18 NOTE — Patient Instructions (Addendum)
Kayla Shah , Thank you for taking time to come for your Medicare Wellness Visit. I appreciate your ongoing commitment to your health goals. Please review the following plan we discussed and let me know if I can assist you in the future.   Screening recommendations/referrals: Colonoscopy: up to date Mammogram: Education provided Bone Density: Education provided Recommended yearly ophthalmology/optometry visit for glaucoma screening and checkup Recommended yearly dental visit for hygiene and checkup  Vaccinations: Influenza vaccine:  Education provided Pneumococcal vaccine: Education provided Tdap vaccine: Education provided Shingles vaccine: Education provided  Advanced directives: Education provided  Conditions/risks identified:   Next appointment: 08-02-2021 @ 8:20   Preventive Care 13 Years and Older, Female Preventive care refers to lifestyle choices and visits with your health care provider that can promote health and wellness. What does preventive care include? A yearly physical exam. This is also called an annual well check. Dental exams once or twice a year. Routine eye exams. Ask your health care provider how often you should have your eyes checked. Personal lifestyle choices, including: Daily care of your teeth and gums. Regular physical activity. Eating a healthy diet. Avoiding tobacco and drug use. Limiting alcohol use. Practicing safe sex. Taking low-dose aspirin every day. Taking vitamin and mineral supplements as recommended by your health care provider. What happens during an annual well check? The services and screenings done by your health care provider during your annual well check will depend on your age, overall health, lifestyle risk factors, and family history of disease. Counseling  Your health care provider may ask you questions about your: Alcohol use. Tobacco use. Drug use. Emotional well-being. Home and relationship well-being. Sexual  activity. Eating habits. History of falls. Memory and ability to understand (cognition). Work and work Statistician. Reproductive health. Screening  You may have the following tests or measurements: Height, weight, and BMI. Blood pressure. Lipid and cholesterol levels. These may be checked every 5 years, or more frequently if you are over 96 years old. Skin check. Lung cancer screening. You may have this screening every year starting at age 75 if you have a 30-pack-year history of smoking and currently smoke or have quit within the past 15 years. Fecal occult blood test (FOBT) of the stool. You may have this test every year starting at age 60. Flexible sigmoidoscopy or colonoscopy. You may have a sigmoidoscopy every 5 years or a colonoscopy every 10 years starting at age 41. Hepatitis C blood test. Hepatitis B blood test. Sexually transmitted disease (STD) testing. Diabetes screening. This is done by checking your blood sugar (glucose) after you have not eaten for a while (fasting). You may have this done every 1-3 years. Bone density scan. This is done to screen for osteoporosis. You may have this done starting at age 95. Mammogram. This may be done every 1-2 years. Talk to your health care provider about how often you should have regular mammograms. Talk with your health care provider about your test results, treatment options, and if necessary, the need for more tests. Vaccines  Your health care provider may recommend certain vaccines, such as: Influenza vaccine. This is recommended every year. Tetanus, diphtheria, and acellular pertussis (Tdap, Td) vaccine. You may need a Td booster every 10 years. Zoster vaccine. You may need this after age 67. Pneumococcal 13-valent conjugate (PCV13) vaccine. One dose is recommended after age 74. Pneumococcal polysaccharide (PPSV23) vaccine. One dose is recommended after age 17. Talk to your health care provider about which screenings and vaccines  you  need and how often you need them. This information is not intended to replace advice given to you by your health care provider. Make sure you discuss any questions you have with your health care provider. Document Released: 02/16/2015 Document Revised: 10/10/2015 Document Reviewed: 11/21/2014 Elsevier Interactive Patient Education  2017 [place] Prevention in the Home Falls can cause injuries. They can happen to people of all ages. There are many things you can do to make your home safe and to help prevent falls. What can I do on the outside of my home? Regularly fix the edges of walkways and driveways and fix any cracks. Remove anything that might make you trip as you walk through a door, such as a raised step or threshold. Trim any bushes or trees on the path to your home. Use bright outdoor lighting. Clear any walking paths of anything that might make someone trip, such as rocks or tools. Regularly check to see if handrails are loose or broken. Make sure that both sides of any steps have handrails. Any raised decks and porches should have guardrails on the edges. Have any leaves, snow, or ice cleared regularly. Use sand or salt on walking paths during winter. Clean up any spills in your garage right away. This includes oil or grease spills. What can I do in the bathroom? Use night lights. Install grab bars by the toilet and in the tub and shower. Do not use towel bars as grab bars. Use non-skid mats or decals in the tub or shower. If you need to sit down in the shower, use a plastic, non-slip stool. Keep the floor dry. Clean up any water that spills on the floor as soon as it happens. Remove soap buildup in the tub or shower regularly. Attach bath mats securely with double-sided non-slip rug tape. Do not have throw rugs and other things on the floor that can make you trip. What can I do in the bedroom? Use night lights. Make sure that you have a light by your bed that  is easy to reach. Do not use any sheets or blankets that are too big for your bed. They should not hang down onto the floor. Have a firm chair that has side arms. You can use this for support while you get dressed. Do not have throw rugs and other things on the floor that can make you trip. What can I do in the kitchen? Clean up any spills right away. Avoid walking on wet floors. Keep items that you use a lot in easy-to-reach places. If you need to reach something above you, use a strong step stool that has a grab bar. Keep electrical cords out of the way. Do not use floor polish or wax that makes floors slippery. If you must use wax, use non-skid floor wax. Do not have throw rugs and other things on the floor that can make you trip. What can I do with my stairs? Do not leave any items on the stairs. Make sure that there are handrails on both sides of the stairs and use them. Fix handrails that are broken or loose. Make sure that handrails are as long as the stairways. Check any carpeting to make sure that it is firmly attached to the stairs. Fix any carpet that is loose or worn. Avoid having throw rugs at the top or bottom of the stairs. If you do have throw rugs, attach them to the floor with carpet tape. Make sure that you  have a light switch at the top of the stairs and the bottom of the stairs. If you do not have them, ask someone to add them for you. What else can I do to help prevent falls? Wear shoes that: Do not have high heels. Have rubber bottoms. Are comfortable and fit you well. Are closed at the toe. Do not wear sandals. If you use a stepladder: Make sure that it is fully opened. Do not climb a closed stepladder. Make sure that both sides of the stepladder are locked into place. Ask someone to hold it for you, if possible. Clearly mark and make sure that you can see: Any grab bars or handrails. First and last steps. Where the edge of each step is. Use tools that help you  move around (mobility aids) if they are needed. These include: Canes. Walkers. Scooters. Crutches. Turn on the lights when you go into a dark area. Replace any light bulbs as soon as they burn out. Set up your furniture so you have a clear path. Avoid moving your furniture around. If any of your floors are uneven, fix them. If there are any pets around you, be aware of where they are. Review your medicines with your doctor. Some medicines can make you feel dizzy. This can increase your chance of falling. Ask your doctor what other things that you can do to help prevent falls. This information is not intended to replace advice given to you by your health care provider. Make sure you discuss any questions you have with your health care provider. Document Released: 11/16/2008 Document Revised: 06/28/2015 Document Reviewed: 02/24/2014 Elsevier Interactive Patient Education  2017 [employer].

## 2021-02-21 ENCOUNTER — Ambulatory Visit: Payer: Medicare Other | Admitting: Orthopaedic Surgery

## 2021-02-22 ENCOUNTER — Ambulatory Visit: Payer: Medicare Other | Admitting: Orthopaedic Surgery

## 2021-03-05 ENCOUNTER — Telehealth: Payer: Self-pay | Admitting: Internal Medicine

## 2021-03-05 NOTE — Telephone Encounter (Signed)
Returned patients call, informed her that Dr. Neomia Dear would need her to make an appt, patient declinced, has nothing wrong at this time and will wait until June to speak to her.

## 2021-03-05 NOTE — Telephone Encounter (Signed)
Copied from [place] [id number]. Topic: Referral - Request for Referral >> Mar 05, 2021  2:15 PM Leward Quan A wrote: Has patient seen PCP for this complaint? No. *If NO, is insurance requiring patient see PCP for this issue before PCP can refer them? Referral for which specialty: Radiology  Preferred provider/office: [facility]  Reason for referral: Mammogram and Bone Density

## 2021-03-11 ENCOUNTER — Other Ambulatory Visit: Payer: Self-pay | Admitting: Internal Medicine

## 2021-03-12 NOTE — Telephone Encounter (Signed)
Requested medications are due for refill today.  yes  Requested medications are on the active medications list.  yes  Last refill. 01/31/2021 #40 0 refills  Future visit scheduled.   yes  Notes to clinic.  Medication not delegated.    Requested Prescriptions  Pending Prescriptions Disp Refills   ondansetron (ZOFRAN) 4 MG tablet [Pharmacy Med Name: ONDANSETRON HCL 4 MG TAB] 40 tablet 0    Sig: TAKE 1 TABLET BY MOUTH EVERY 8 HOURS AS NEEDED FOR NAUSEA OR VOMITING     Not Delegated - Gastroenterology: Antiemetics - ondansetron Failed - 03/11/2021  2:10 PM      Failed - This refill cannot be delegated      Passed - AST in normal range and within 360 days    AST  Date Value Ref Range Status  02/01/2021 13 0 - 40 IU/L Final   SGOT(AST)  Date Value Ref Range Status  08/28/2013 23 15 - 37 Unit/L Final          Passed - ALT in normal range and within 360 days    ALT  Date Value Ref Range Status  02/01/2021 7 0 - 32 IU/L Final   SGPT (ALT)  Date Value Ref Range Status  08/28/2013 16 U/L Final    Comment:    14-63 NOTE: New Reference Range 08/23/13           Passed - Valid encounter within last 6 months    Recent Outpatient Visits           1 month ago Aortic atherosclerosis ([place])   [facility] [place], Lauren A, NP   4 months ago Osteoarthritis of right hip, unspecified osteoarthritis type   [facility] [place], Lauren A, NP   6 months ago Urinary frequency   [place], Lauren A, NP   6 months ago Osteoarthritis of right hip, unspecified osteoarthritis type   [facility] [place], Lauren A, NP   9 months ago COPD (chronic obstructive pulmonary disease) with chronic bronchitis ([place])   [facility], Coralie Keens, NP       Future Appointments             In 1 week Erlinda Hong, Marylynn Pearson, MD [place]   In 4 months Vigg, Avanti, MD [facility]

## 2021-03-19 ENCOUNTER — Encounter: Payer: Self-pay | Admitting: Orthopaedic Surgery

## 2021-03-19 ENCOUNTER — Other Ambulatory Visit: Payer: Self-pay

## 2021-03-19 ENCOUNTER — Ambulatory Visit: Payer: Medicare Other | Admitting: Orthopaedic Surgery

## 2021-03-19 DIAGNOSIS — Z96641 Presence of right artificial hip joint: Secondary | ICD-10-CM | POA: Diagnosis not present

## 2021-03-19 NOTE — Progress Notes (Signed)
Post-Op Visit Note   Patient: [facility]           Date of Birth: 22-Oct-1955           MRN: [id number] Visit Date: 03/19/2021 PCP: Charlynne Cousins, MD   Assessment & Plan:  Chief Complaint:  Chief Complaint  Patient presents with   Right Hip - Follow-up    Right total hip arthroplasty 11/19/2020   Visit Diagnoses:  1. History of total hip replacement, right     Plan: Patient is a pleasant 66 year old female who comes in today 4 months status post right total hip replacement 11/19/2020.  She has been doing great.  No complaints.  Examination of her right hip reveals full hip range of motion and strength.  She is neurovascular tact distally.  At this point, she will continue to advance activity as tolerated.  Follow-up with [place] in 5 months time when she is 9 months out for surgery for AP pelvis x-rays.  Dental prophylaxis reinforced.  Call with concerns or questions.  Follow-Up Instructions: Return in about 5 months (around 08/16/2021).   Orders:  No orders of the defined types were placed in this encounter.  No orders of the defined types were placed in this encounter.   Imaging: No new imaging  PMFS History: Patient Active Problem List   Diagnosis Date Noted   Vitamin B12 deficiency 02/01/2021   Chronic pain of right knee 02/01/2021   Status post total replacement of right hip 11/19/2020   Avascular necrosis of right femoral head ([place]) 10/30/2020   Urinary frequency 09/13/2020   Cigarette nicotine dependence without complication 26/94/8546   Osteoarthritis of right hip 08/30/2020   Chronic HFrEF (heart failure with reduced ejection fraction) ([place]) 08/19/2020   Aortic atherosclerosis ([place]) 05/01/2020   Chronic bilateral low back pain without sciatica 02/17/2020   Intractable nausea and vomiting 05/01/2019   Anxiety and depression 07/30/2017   CAD (coronary artery disease) 07/30/2017   Hot flashes due to surgical menopause 07/30/2017   Insomnia 07/30/2017    Osteoporosis 02/27/2016   Migraine 06/07/2015   Seizures ([place]) 06/07/2015   Gastroesophageal reflux disease without esophagitis 06/07/2015   COPD (chronic obstructive pulmonary disease) with chronic bronchitis ([place]) 08/14/2014   Essential (primary) hypertension 08/14/2014   Pure hypercholesterolemia 08/14/2014   Past Medical History:  Diagnosis Date   Allergy    Anxiety    agoraphobia    Arthritis    Chicken pox    COPD (chronic obstructive pulmonary disease) ([place])    Depression    Family history of adverse reaction to anesthesia    PONV   GERD (gastroesophageal reflux disease)    Headache    History of blood transfusion    1984   Hyperlipidemia    Hypertension    Migraine    PONV (postoperative nausea and vomiting)    Seizure ([place])    last seizure 04/2019    Family History  Problem Relation Age of Onset   Stroke Mother    Depression Mother    Arthritis Mother    Heart disease Father    Alcohol abuse Father    Diabetes Father    Osteoporosis Sister    Kidney disease Sister    Arthritis Sister    Dementia Sister    Arthritis Maternal Grandmother    Heart disease Maternal Grandmother    Hyperlipidemia Maternal Grandmother    Diabetes Paternal Grandmother    Breast cancer Neg Hx     Past  Surgical History:  Procedure Laterality Date   APPENDECTOMY  1984   CATARACT EXTRACTION, BILATERAL Bilateral    COLONOSCOPY WITH PROPOFOL N/A 03/26/2017   Procedure: COLONOSCOPY WITH PROPOFOL;  Surgeon: Jonathon Bellows, MD;  Location: [facility] ENDOSCOPY;  Service: Gastroenterology;  Laterality: N/A;   TOTAL ABDOMINAL HYSTERECTOMY  1984   Total   TOTAL HIP ARTHROPLASTY Right 11/19/2020   Procedure: RIGHT TOTAL HIP ARTHROPLASTY ANTERIOR APPROACH;  Surgeon: Leandrew Koyanagi, MD;  Location: [place];  Service: Orthopedics;  Laterality: Right;  C-3   VENTRAL HERNIA REPAIR N/A 09/16/2019   Procedure: HERNIA REPAIR VENTRAL ADULT, Open;  Surgeon: Fredirick Maudlin, MD;  Location: [facility] ORS;  Service:  General;  Laterality: N/A;   WISDOM TOOTH EXTRACTION     Social History   Occupational History    Comment: disabled  Tobacco Use   Smoking status: Every Day    Packs/day: 1.00    Years: 45.00    Pack years: 45.00    Types: Cigarettes   Smokeless tobacco: Never   Tobacco comments:    reports quitting in feb 2021  Vaping Use   Vaping Use: Never used  Substance and Sexual Activity   Alcohol use: No   Drug use: Yes    Types: Marijuana    Comment: every day   Sexual activity: Never

## 2021-04-05 ENCOUNTER — Other Ambulatory Visit: Payer: Self-pay | Admitting: Nurse Practitioner

## 2021-04-05 DIAGNOSIS — E7800 Pure hypercholesterolemia, unspecified: Secondary | ICD-10-CM

## 2021-05-03 ENCOUNTER — Other Ambulatory Visit: Payer: Self-pay | Admitting: Internal Medicine

## 2021-05-03 DIAGNOSIS — K219 Gastro-esophageal reflux disease without esophagitis: Secondary | ICD-10-CM

## 2021-05-03 NOTE — Telephone Encounter (Signed)
Requested Prescriptions  ?Pending Prescriptions Disp Refills  ?? famotidine (PEPCID) 20 MG tablet [Pharmacy Med Name: FAMOTIDINE 20 MG TAB] 90 tablet 0  ?  Sig: TAKE 1/2 TABLET (10 MG TOTAL) BY MOUTH 2TIMES DAILY  ?  ? Gastroenterology:  H2 Antagonists Passed - 05/03/2021  9:31 AM  ?  ?  Passed - Valid encounter within last 12 months  ?  Recent Outpatient Visits   ?      ? 3 months ago Aortic atherosclerosis ([place])  ? [place], NP  ? 6 months ago Osteoarthritis of right hip, unspecified osteoarthritis type  ? [facility], Lauren A, NP  ? 7 months ago Urinary frequency  ? [place], NP  ? 8 months ago Osteoarthritis of right hip, unspecified osteoarthritis type  ? [place], NP  ? 11 months ago COPD (chronic obstructive pulmonary disease) with chronic bronchitis ([place])  ? [facility] [place], Coralie Keens, NP  ?  ?  ?Future Appointments   ?        ? In 3 months Vigg, Avanti, MD [facility]  ?  ? ?  ?  ?  ? ?

## 2021-05-16 DIAGNOSIS — L814 Other melanin hyperpigmentation: Secondary | ICD-10-CM | POA: Diagnosis not present

## 2021-05-16 DIAGNOSIS — X32XXXA Exposure to sunlight, initial encounter: Secondary | ICD-10-CM | POA: Diagnosis not present

## 2021-05-16 DIAGNOSIS — L821 Other seborrheic keratosis: Secondary | ICD-10-CM | POA: Diagnosis not present

## 2021-05-16 DIAGNOSIS — L820 Inflamed seborrheic keratosis: Secondary | ICD-10-CM | POA: Diagnosis not present

## 2021-05-16 DIAGNOSIS — L570 Actinic keratosis: Secondary | ICD-10-CM | POA: Diagnosis not present

## 2021-05-16 DIAGNOSIS — L538 Other specified erythematous conditions: Secondary | ICD-10-CM | POA: Diagnosis not present

## 2021-05-30 ENCOUNTER — Encounter: Payer: Self-pay | Admitting: Internal Medicine

## 2021-06-10 ENCOUNTER — Other Ambulatory Visit: Payer: Self-pay | Admitting: Nurse Practitioner

## 2021-06-10 DIAGNOSIS — I10 Essential (primary) hypertension: Secondary | ICD-10-CM

## 2021-07-02 ENCOUNTER — Ambulatory Visit (INDEPENDENT_AMBULATORY_CARE_PROVIDER_SITE_OTHER): Payer: Medicare Other | Admitting: Internal Medicine

## 2021-07-02 ENCOUNTER — Encounter: Payer: Self-pay | Admitting: Internal Medicine

## 2021-07-02 VITALS — BP 136/84 | HR 65 | Temp 98.0°F | Ht 65.59 in | Wt 141.8 lb

## 2021-07-02 DIAGNOSIS — R221 Localized swelling, mass and lump, neck: Secondary | ICD-10-CM | POA: Diagnosis not present

## 2021-07-02 DIAGNOSIS — R21 Rash and other nonspecific skin eruption: Secondary | ICD-10-CM | POA: Diagnosis not present

## 2021-07-02 DIAGNOSIS — Z5321 Procedure and treatment not carried out due to patient leaving prior to being seen by health care provider: Secondary | ICD-10-CM | POA: Diagnosis not present

## 2021-07-02 MED ORDER — FEXOFENADINE HCL 180 MG PO TABS: 180.0000 mg | ORAL_TABLET | Freq: Every day | ORAL | 1 refills | Status: DC

## 2021-07-02 MED ORDER — CEPHALEXIN 500 MG PO CAPS: 500.0000 mg | ORAL_CAPSULE | Freq: Two times a day (BID) | ORAL | 0 refills | Status: AC

## 2021-07-02 MED ORDER — METHYLPREDNISOLONE 4 MG PO TBPK: ORAL_TABLET | ORAL | 0 refills | Status: DC

## 2021-07-02 MED ORDER — FAMOTIDINE 20 MG PO TABS: 20.0000 mg | ORAL_TABLET | Freq: Two times a day (BID) | ORAL | 0 refills | Status: DC

## 2021-07-02 NOTE — Progress Notes (Signed)
BP 136/84   Pulse 65   Temp 98 F (36.7 C) (Oral)   Ht 5' 5.59" (1.666 m)   Wt 141 lb 12.8 oz (64.3 kg)   SpO2 97%   BMI 23.17 kg/m    Subjective:    Patient ID: Kayla Shah, female    DOB: 1955-10-26, 66 y.o.   MRN: [id number]  Chief Complaint  Patient presents with   Rash    Started about 2 weeks ago, on chest, very itchy and painful, getting worse every day. Neck is swollen     HPI: Kayla Shah is a 66 y.o. female  Rash This is a new (rash started x 2 weeks painful and itchy . per pt she has had night sweats and got an idea that she should sleep with wash clothes on her neck) problem.   Chief Complaint  Patient presents with   Rash    Started about 2 weeks ago, on chest, very itchy and painful, getting worse every day. Neck is swollen     Relevant past medical, surgical, family and social history reviewed and updated as indicated. Interim medical history since our last visit reviewed. Allergies and medications reviewed and updated.  Review of Systems  Skin:  Positive for rash.   Per HPI unless specifically indicated above     Objective:    BP 136/84   Pulse 65   Temp 98 F (36.7 C) (Oral)   Ht 5' 5.59" (1.666 m)   Wt 141 lb 12.8 oz (64.3 kg)   SpO2 97%   BMI 23.17 kg/m   Wt Readings from Last 3 Encounters:  07/02/21 141 lb 12.8 oz (64.3 kg)  02/01/21 133 lb 12.8 oz (60.7 kg)  11/19/20 137 lb (62.1 kg)    Physical Exam Vitals and nursing note reviewed.  Constitutional:      General: She is not in acute distress.    Appearance: Normal appearance. She is not ill-appearing or diaphoretic.  HENT:     Nose: No congestion or rhinorrhea.  Cardiovascular:     Heart sounds: No murmur heard.   No friction rub.  Pulmonary:     Effort: No respiratory distress.     Breath sounds: No stridor. No wheezing, rhonchi or rales.  Chest:     Chest wall: No tenderness.  Musculoskeletal:        General: Swelling present. No tenderness, deformity  or signs of injury.     Right lower leg: No edema.     Left lower leg: No edema.  Skin:    General: Skin is dry.     Coloration: Skin is not jaundiced or pale.     Findings: Erythema and rash present. No bruising or lesion.  Neurological:     Mental Status: She is alert.    Results for orders placed or performed in visit on 02/01/21  CBC with Differential/Platelet  Result Value Ref Range   WBC 7.5 3.4 - 10.8 x10E3/uL   RBC 4.34 3.77 - 5.28 x10E6/uL   Hemoglobin 14.1 11.1 - 15.9 g/dL   Hematocrit 42.4 34.0 - 46.6 %   MCV 98 (H) 79 - 97 fL   MCH 32.5 26.6 - 33.0 pg   MCHC 33.3 31.5 - 35.7 g/dL   RDW 12.1 11.7 - 15.4 %   Platelets 242 150 - 450 x10E3/uL   Neutrophils 73 Not Estab. %   Lymphs 21 Not Estab. %   Monocytes 5 Not Estab. %   Eos  1 Not Estab. %   Basos 0 Not Estab. %   Neutrophils Absolute 5.4 1.4 - 7.0 x10E3/uL   Lymphocytes Absolute 1.6 0.7 - 3.1 x10E3/uL   Monocytes Absolute 0.4 0.1 - 0.9 x10E3/uL   EOS (ABSOLUTE) 0.0 0.0 - 0.4 x10E3/uL   Basophils Absolute 0.0 0.0 - 0.2 x10E3/uL   Immature Granulocytes 0 Not Estab. %   Immature Grans (Abs) 0.0 0.0 - 0.1 x10E3/uL  Comprehensive metabolic panel  Result Value Ref Range   Glucose 95 70 - 99 mg/dL   BUN 9 8 - 27 mg/dL   Creatinine, Ser 0.87 0.57 - 1.00 mg/dL   eGFR 74 >59 mL/min/1.73   BUN/Creatinine Ratio 10 (L) 12 - 28   Sodium 140 134 - 144 mmol/L   Potassium 4.7 3.5 - 5.2 mmol/L   Chloride 101 96 - 106 mmol/L   CO2 26 20 - 29 mmol/L   Calcium 9.5 8.7 - 10.3 mg/dL   Total Protein 7.4 6.0 - 8.5 g/dL   Albumin 4.6 3.8 - 4.8 g/dL   Globulin, Total 2.8 1.5 - 4.5 g/dL   Albumin/Globulin Ratio 1.6 1.2 - 2.2   Bilirubin Total <0.2 0.0 - 1.2 mg/dL   Alkaline Phosphatase 88 44 - 121 IU/L   AST 13 0 - 40 IU/L   ALT 7 0 - 32 IU/L  Vitamin B12  Result Value Ref Range   Vitamin B-12 1,202 232 - 1,245 pg/mL  Lipid Panel w/o Chol/HDL Ratio  Result Value Ref Range   Cholesterol, Total 239 (H) 100 - 199 mg/dL    Triglycerides 115 0 - 149 mg/dL   HDL 81 >39 mg/dL   VLDL Cholesterol Cal 20 5 - 40 mg/dL   LDL Chol Calc (NIH) 138 (H) 0 - 99 mg/dL        Current Outpatient Medications:    cephALEXin (KEFLEX) 500 MG capsule, Take 1 capsule (500 mg total) by mouth 2 (two) times daily for 7 days., Disp: 14 capsule, Rfl: 0   diazepam (VALIUM) 5 MG tablet, Take 5 mg by mouth 3 (three) times daily as needed., Disp: , Rfl:    famotidine (PEPCID) 20 MG tablet, Take 1 tablet (20 mg total) by mouth 2 (two) times daily for 7 days., Disp: 14 tablet, Rfl: 0   fexofenadine (ALLEGRA ALLERGY) 180 MG tablet, Take 1 tablet (180 mg total) by mouth daily., Disp: 10 tablet, Rfl: 1   lamoTRIgine (LAMICTAL) 100 MG tablet, Take 1 tablet (100 mg total) by mouth 2 (two) times daily., Disp: 180 tablet, Rfl: 3   methylPREDNISolone (MEDROL DOSEPAK) 4 MG TBPK tablet, Use as directed, Disp: 1 each, Rfl: 0   ondansetron (ZOFRAN) 4 MG tablet, TAKE 1 TABLET BY MOUTH EVERY 8 HOURS AS NEEDED FOR NAUSEA OR VOMITING, Disp: 40 tablet, Rfl: 0   pantoprazole (PROTONIX) 40 MG tablet, TAKE 1 TABLET BY MOUTH TWICE A DAY, Disp: 180 tablet, Rfl: 1   [name] 1.25 MG tablet, TAKE 1 TABLET BY MOUTH DAILY, Disp: 90 tablet, Rfl: 3   rizatriptan (MAXALT-MLT) 10 MG disintegrating tablet, Take 1 tablet (10 mg total) by mouth as needed for migraine. May repeat in 2 hours if needed, Disp: 9 tablet, Rfl: 11   simvastatin (ZOCOR) 20 MG tablet, TAKE 1 TABLET BY MOUTH DAILY AT 6 PM, Disp: 90 tablet, Rfl: 1   verapamil (CALAN-SR) 240 MG CR tablet, TAKE 1 TABLET BY MOUTH DAILY, Disp: 90 tablet, Rfl: 1   vitamin B-12 (CYANOCOBALAMIN) 500 MCG tablet, Take  500 mcg by mouth daily., Disp: , Rfl:     Assessment & Plan:  Rash and neck swelling:  Will need to go to the ER.  Will administer 120 mg solumedrol  Needs further imaging/ labs/ possible [place] ? Ro obstruction / will need obs for anaphylaxis. Unsure etiology.  Pt has night sweats , unsure swelling from  Lymphadenopathy vs obstruction vs anaphyalxis.   Problem List Items Addressed This Visit   None    No orders of the defined types were placed in this encounter.    Meds ordered this encounter  Medications   methylPREDNISolone (MEDROL DOSEPAK) 4 MG TBPK tablet    Sig: Use as directed    Dispense:  1 each    Refill:  0   fexofenadine (ALLEGRA ALLERGY) 180 MG tablet    Sig: Take 1 tablet (180 mg total) by mouth daily.    Dispense:  10 tablet    Refill:  1   famotidine (PEPCID) 20 MG tablet    Sig: Take 1 tablet (20 mg total) by mouth 2 (two) times daily for 7 days.    Dispense:  14 tablet    Refill:  0   cephALEXin (KEFLEX) 500 MG capsule    Sig: Take 1 capsule (500 mg total) by mouth 2 (two) times daily for 7 days.    Dispense:  14 capsule    Refill:  0     Follow up plan: No follow-ups on file.

## 2021-07-03 ENCOUNTER — Telehealth: Payer: Self-pay

## 2021-07-03 DIAGNOSIS — J392 Other diseases of pharynx: Secondary | ICD-10-CM

## 2021-07-03 DIAGNOSIS — R21 Rash and other nonspecific skin eruption: Secondary | ICD-10-CM

## 2021-07-03 NOTE — Telephone Encounter (Signed)
Patient made aware of results and verbalized understanding.  

## 2021-07-03 NOTE — Telephone Encounter (Addendum)
Pt. States she went to ED and after 2 hours she left. Was not treated in ED. "I had to go home and take care of my sister.I feel better today. The swelling is a lot better." "If the swelling gets worse I'll go back to the ED." Wants Dr. Neomia Dear to be aware. Please advise pt.

## 2021-07-05 ENCOUNTER — Telehealth: Payer: Self-pay | Admitting: Internal Medicine

## 2021-07-05 NOTE — Telephone Encounter (Signed)
Referral Request - Has patient seen PCP for this complaint? no *If NO, is insurance requiring patient see PCP for this issue before PCP can refer them? Referral for which specialty: mammogram Preferred provider/office: [place] Reason for referral: [facility]

## 2021-07-10 ENCOUNTER — Other Ambulatory Visit: Payer: Self-pay | Admitting: Internal Medicine

## 2021-07-10 DIAGNOSIS — E8941 Symptomatic postprocedural ovarian failure: Secondary | ICD-10-CM

## 2021-07-10 NOTE — Telephone Encounter (Signed)
Requested Prescriptions  Pending Prescriptions Disp Refills  . [name] 1.25 MG tablet [Pharmacy Med Name: [name] 1.25 MG TAB] 90 tablet 3    Sig: TAKE 1 TABLET BY MOUTH DAILY     OB/GYN:  Estrogens Passed - 07/10/2021 10:03 AM      Passed - Mammogram is up-to-date per Health Maintenance      Passed - Last BP in normal range    BP Readings from Last 1 Encounters:  07/02/21 136/84         Passed - Valid encounter within last 12 months    Recent Outpatient Visits          1 week ago Neck swelling   [place], MD   5 months ago Aortic atherosclerosis ([place])   [place], Lauren A, NP   8 months ago Osteoarthritis of right hip, unspecified osteoarthritis type   [place], Lauren A, NP   10 months ago Urinary frequency   [place], Lauren A, NP   10 months ago Osteoarthritis of right hip, unspecified osteoarthritis type   [place], Lauren A, NP      Future Appointments            In 3 weeks Mecum, Dani Gobble, PA-C [employer], [facility]

## 2021-07-15 ENCOUNTER — Telehealth: Payer: Self-pay | Admitting: Family Medicine

## 2021-07-15 NOTE — Telephone Encounter (Signed)
Pt states she is having a skin irritation(for 3 weeks now) which is a side effect from lamoTRIgine (LAMICTAL) 100 MG tablet, pt is asking for a call from RN to discuss

## 2021-07-15 NOTE — Telephone Encounter (Signed)
Reviewed chart and looks like she has been on lamotrigine '100mg'$  po BID since 2021.   Called pt back. Tolerating med well, no seizures/migraines on med. Having trouble w/ eczema. She is following dermatologist for this who did a biopsy and confirmed eczema. Put on treatment and sx have improved.  Advised unlikely r/t lamotrigine as she has been on this for awhile now. If sx worsen or she develops any new sx she should let us know. She verbalized understanding and appreciation.

## 2021-08-02 ENCOUNTER — Ambulatory Visit: Payer: Medicare Other | Admitting: Internal Medicine

## 2021-08-05 ENCOUNTER — Ambulatory Visit: Payer: Self-pay

## 2021-08-05 ENCOUNTER — Ambulatory Visit: Payer: Medicare Other | Admitting: Physician Assistant

## 2021-08-05 NOTE — Telephone Encounter (Signed)
Summary: feeling sick from [name]   Pt called to reschedule her appt to later date this week/ stated she was sick / when asked about her "feeling sick" she stated she knows what is wrong and that is was her [name] and that her dose may need to be changed but she needed to be seen / please advise     Called pt and LM on VM to call back.

## 2021-08-05 NOTE — Telephone Encounter (Signed)
Summary: feeling sick from [name]   Pt called to reschedule her appt to later date this week/ stated she was sick / when asked about her "feeling sick" she stated she knows what is wrong and that is was her [name] and that her dose may need to be changed but she needed to be seen / please advise     Called pt and LM on VM to call back to office.

## 2021-08-05 NOTE — Telephone Encounter (Signed)
  Chief Complaint: [name] no longer working Symptoms: nausea and hot sweats/flashes Frequency: 4 last night Pertinent Negatives: Patient denies na Disposition: '[]'$ ED /'[]'$ Urgent Care (no appt availability in office) / '[]'$ Appointment(In office/virtual)/ '[]'$  [facility]/ '[]'$ Home Care/ '[]'$ Refused Recommended Disposition /'[]'$ [facility]/ '[x]'$  Follow-up with PCP Additional Notes: Pt already has appt this Thursday. She was calling to say [name] is not working anymore. She has been on it 40 years. She is looking for a different hormone for now hot flashes. She had hysterectomy in her twenties.  Answer Assessment - Initial Assessment Questions 1. NAME of MEDICATION: "What medicine are you calling about?"     [name] 2. QUESTION: "What is your question?" (e.g., double dose of medicine, side effect)     Need something new. 3. PRESCRIBING HCP: "Who prescribed it?" Reason: if prescribed by specialist, call should be referred to that group.     Vigg last, have been on it for years 4. SYMPTOMS: "Do you have any symptoms?"     nauseated 5. SEVERITY: If symptoms are present, ask "Are they mild, moderate or severe?" 6. PREGNANCY:  "Is there any chance that you are pregnant?" "When was your last menstrual period?"     No  Protocols used: Medication Question Call-A-AH

## 2021-08-07 NOTE — Progress Notes (Deleted)
Established Patient Office Visit  Name: [facility]   MRN: [id number]    DOB: [birth year]   Date:08/07/2021  Today's Provider: Talitha Givens, MHS, PA-C Introduced myself to the patient as a PA-C and provided education on APPs in clinical practice.         Subjective  Chief Complaint  No chief complaint on file.   HPI   Patient Active Problem List   Diagnosis Date Noted   Neck swelling 07/02/2021   Rash 07/02/2021   Vitamin B12 deficiency 02/01/2021   Chronic pain of right knee 02/01/2021   Status post total replacement of right hip 11/19/2020   Avascular necrosis of right femoral head ([place]) 10/30/2020   Urinary frequency 09/13/2020   Cigarette nicotine dependence without complication 52/77/8242   Osteoarthritis of right hip 08/30/2020   Chronic HFrEF (heart failure with reduced ejection fraction) ([place]) 08/19/2020   Aortic atherosclerosis ([place]) 05/01/2020   Chronic bilateral low back pain without sciatica 02/17/2020   Intractable nausea and vomiting 05/01/2019   Anxiety and depression 07/30/2017   CAD (coronary artery disease) 07/30/2017   Hot flashes due to surgical menopause 07/30/2017   Insomnia 07/30/2017   Osteoporosis 02/27/2016   Migraine 06/07/2015   Seizures ([place]) 06/07/2015   Gastroesophageal reflux disease without esophagitis 06/07/2015   COPD (chronic obstructive pulmonary disease) with chronic bronchitis ([place]) 08/14/2014   Essential (primary) hypertension 08/14/2014   Pure hypercholesterolemia 08/14/2014    Past Surgical History:  Procedure Laterality Date   APPENDECTOMY  1984   CATARACT EXTRACTION, BILATERAL Bilateral    COLONOSCOPY WITH PROPOFOL N/A 03/26/2017   Procedure: COLONOSCOPY WITH PROPOFOL;  Surgeon: Jonathon Bellows, MD;  Location[facility] END[facility]COPY;  Service: Gastroenterology;  Laterality: N/A;   TOTAL ABDOMINAL HYSTERECTOMY  1984   Total   TOTAL HIP ARTHROPLASTY Right 11/19/2020   Procedure: RIGHT TOTAL HIP ARTHROPLASTY ANTERIOR  APPROACH;  Surgeon: Leandrew Koyanagi, MD;  Location: [place];  Service: Orthopedics;  Laterality: Right;  C-3   VENTRAL HERNIA REPAIR N/A 09/16/2019   Procedure: HERNIA REPAIR VENTRAL ADULT, Open;  Surgeon: Fredirick Maudlin, MD;  Location: [facility] ORS;  Service: General;  Laterality: N/A;   WISDOM TOOTH EXTRACTION      Family History  Problem Relation Age of Onset   Stroke Mother    Depression Mother    Arthritis Mother    Heart disease Father    Alcohol abuse Father    Diabetes Father    Osteoporosis Sister    Kidney disease Sister    Arthritis Sister    Dementia Sister    Arthritis Maternal Grandmother    Heart disease Maternal Grandmother    Hyperlipidemia Maternal Grandmother    Diabetes Paternal Grandmother    Breast cancer Neg Hx     Social History   Tobacco Use   Smoking status: Every Day    Packs/day: 1.00    Years: 45.00    Total pack years: 45.00    Types: Cigarettes   Smokeless tobacco: Never   Tobacco comments:    reports quitting in feb 2021  Substance Use Topics   Alcohol use: No     Current Outpatient Medications:    diazepam (VALIUM) 5 MG tablet, Take 5 mg by mouth 3 (three) times daily as needed., Disp: , Rfl:    famotidine (PEPCID) 20 MG tablet, Take 1 tablet (20 mg total) by mouth 2 (two) times daily for 7 days., Disp: 14 tablet, Rfl: 0  fexofenadine (ALLEGRA ALLERGY) 180 MG tablet, Take 1 tablet (180 mg total) by mouth daily., Disp: 10 tablet, Rfl: 1   lamoTRIgine (LAMICTAL) 100 MG tablet, Take 1 tablet (100 mg total) by mouth 2 (two) times daily., Disp: 180 tablet, Rfl: 3   methylPREDNISolone (MEDROL DOSEPAK) 4 MG TBPK tablet, Use as directed, Disp: 1 each, Rfl: 0   ondansetron (ZOFRAN) 4 MG tablet, TAKE 1 TABLET BY MOUTH EVERY 8 HOURS AS NEEDED FOR NAUSEA OR VOMITING, Disp: 40 tablet, Rfl: 0   pantoprazole (PROTONIX) 40 MG tablet, TAKE 1 TABLET BY MOUTH TWICE A DAY, Disp: 180 tablet, Rfl: 1   [name] 1.25 MG tablet, TAKE 1 TABLET BY MOUTH DAILY, Disp:  90 tablet, Rfl: 3   rizatriptan (MAXALT-MLT) 10 MG disintegrating tablet, Take 1 tablet (10 mg total) by mouth as needed for migraine. May repeat in 2 hours if needed, Disp: 9 tablet, Rfl: 11   simvastatin (ZOCOR) 20 MG tablet, TAKE 1 TABLET BY MOUTH DAILY AT 6 PM, Disp: 90 tablet, Rfl: 1   verapamil (CALAN-SR) 240 MG CR tablet, TAKE 1 TABLET BY MOUTH DAILY, Disp: 90 tablet, Rfl: 1   vitamin B-12 (CYANOCOBALAMIN) 500 MCG tablet, Take 500 mcg by mouth daily., Disp: , Rfl:   Allergies  Allergen Reactions   Codeine Nausea And Vomiting   Other     No Anti Depressants - "makes me crazy"   Fluoxetine Anxiety   Paroxetine Anxiety    Becomes very aggressive with mood swings.    I personally reviewed {Reviewed:14835} with the patient/caregiver today.   ROS    Objective  There were no vitals filed for this visit.  There is no height or weight on file to calculate BMI.  Physical Exam   No results found for this or any previous visit (from the past 2160 hour(s)).   PHQ2/9:    07/02/2021    1:59 PM 02/18/2021   10:03 AM 02/01/2021    8:36 AM 08/30/2020    9:41 AM 05/31/2020    3:30 PM  Depression screen PHQ 2/9  Decreased Interest 0 0 0 0 0  Down, Depressed, Hopeless 0 0 0 0 0  PHQ - 2 Score 0 0 0 0 0  Altered sleeping 0 0 0 0 0  Tired, decreased energy 0 0 0 0 0  Change in appetite 0 0 0 0 0  Feeling bad or failure about yourself  0 0 0 0 0  Trouble concentrating 0 0 0 0 0  Moving slowly or fidgety/restless 0 0 0 0 0  Suicidal thoughts 0  0 0 0  PHQ-9 Score 0 0 0 0 0  Difficult doing work/chores Not difficult at all Not difficult at all Not difficult at all  Not difficult at all      Fall Risk:    07/02/2021    1:58 PM 02/01/2021    8:36 AM 08/30/2020    9:40 AM 05/31/2020    3:31 PM 10/13/2019   10:06 AM  Fall Risk   Falls in the past year? 1 0 1 1 0  Number falls in past yr: 1 0 1 1   Injury with Fall? 0 0 1 1   Risk for fall due to : History of fall(s) No Fall  Risks History of fall(s);Impaired balance/gait Impaired balance/gait;Impaired mobility;Orthopedic patient   Follow up Falls evaluation completed Falls evaluation completed Falls evaluation completed Falls evaluation completed       Functional Status Survey:  Assessment & Plan

## 2021-08-08 ENCOUNTER — Ambulatory Visit: Payer: Medicare Other | Admitting: Physician Assistant

## 2021-08-09 ENCOUNTER — Telehealth: Payer: Self-pay

## 2021-08-09 ENCOUNTER — Encounter: Payer: Self-pay | Admitting: Physician Assistant

## 2021-08-09 ENCOUNTER — Ambulatory Visit (INDEPENDENT_AMBULATORY_CARE_PROVIDER_SITE_OTHER): Payer: Medicare Other | Admitting: Physician Assistant

## 2021-08-09 VITALS — BP 174/94 | HR 80 | Temp 98.0°F | Ht 65.35 in | Wt 134.4 lb

## 2021-08-09 DIAGNOSIS — M810 Age-related osteoporosis without current pathological fracture: Secondary | ICD-10-CM

## 2021-08-09 DIAGNOSIS — E7800 Pure hypercholesterolemia, unspecified: Secondary | ICD-10-CM

## 2021-08-09 DIAGNOSIS — I10 Essential (primary) hypertension: Secondary | ICD-10-CM

## 2021-08-09 DIAGNOSIS — E8941 Symptomatic postprocedural ovarian failure: Secondary | ICD-10-CM

## 2021-08-09 DIAGNOSIS — Z1231 Encounter for screening mammogram for malignant neoplasm of breast: Secondary | ICD-10-CM

## 2021-08-09 DIAGNOSIS — E538 Deficiency of other specified B group vitamins: Secondary | ICD-10-CM

## 2021-08-09 DIAGNOSIS — F32A Depression, unspecified: Secondary | ICD-10-CM

## 2021-08-09 DIAGNOSIS — F419 Anxiety disorder, unspecified: Secondary | ICD-10-CM

## 2021-08-09 MED ORDER — VALSARTAN 40 MG PO TABS: 40.0000 mg | ORAL_TABLET | Freq: Every day | ORAL | 0 refills | Status: DC

## 2021-08-09 MED ORDER — GABAPENTIN 100 MG PO CAPS: 100.0000 mg | ORAL_CAPSULE | Freq: Three times a day (TID) | ORAL | 0 refills | Status: DC

## 2021-08-09 NOTE — Telephone Encounter (Signed)
Would you mind calling this patient and telling her to hold off on the Valsartan. I still want a follow up in 4 weeks and her to take her BP daily but I'm not sure if today's readings are an outlier or showing something consistent. She doesn't need to fill it and we can discuss starting it at follow up in 4 weeks. Per Junie Panning Mecum PA-C  LVM for patient to inform her of what Erin's recommendation is. Asked for a return call

## 2021-08-09 NOTE — Progress Notes (Signed)
Established Patient Office Visit  Name: [facility]   MRN: [id number]    DOB: Jan 29, 1956   Date:08/09/2021  Today's Provider: Talitha Givens, MHS, PA-C Introduced myself to the patient as a PA-C and provided education on APPs in clinical practice.         Subjective  Chief Complaint  Chief Complaint  Patient presents with   Anxiety   Depression   Hypertension   Coronary Artery Disease   Gastroesophageal Reflux   Hyperlipidemia   Need for Mammogram   Medication Refill    Here for medication refill.     HPI  States she is having concerns for "hot flashes" - occurring 3-4 times every night  States she is waking up drenched in sweat and has to get a shower  She states she is on premarin oral 1.25 mg po qhs  States in the past she has had relief with this but it has stopped working [employer] she is having nausea as well Is taking Zofran for nausea Reports she has a past history of needing to increase caloric intake to gain weight     Hypertension: - Medications: Verapamil 240 mg PO QD  - Compliance: excellent - Checking BP at home: she is checking at home but notices fluctuations in readings. She is not sure of range.  - Denies any SOB, CP, vision changes, LE edema, medication SEs, or symptoms of hypotension   HLD Taking simvastatin Reports diet that is not conscious of saturated fat content- typically due to her sister's preferences.   Depression Seeing Psychiatry and therapy services Reports she is thinks she is coping well with care-taking responsibilities of her sister with dementia  Discussed adding Paroxetine for hot flashes but she has allergies  She is taking lamictal for seizures and reports this helps with her migraines as well.      Patient Active Problem List   Diagnosis Date Noted   Neck swelling 07/02/2021   Rash 07/02/2021   Vitamin B12 deficiency 02/01/2021   Chronic pain of right knee 02/01/2021   Status post total  replacement of right hip 11/19/2020   Avascular necrosis of right femoral head ([place]) 10/30/2020   Urinary frequency 09/13/2020   Cigarette nicotine dependence without complication 60/11/9321   Osteoarthritis of right hip 08/30/2020   Chronic HFrEF (heart failure with reduced ejection fraction) ([place]) 08/19/2020   Aortic atherosclerosis ([place]) 05/01/2020   Chronic bilateral low back pain without sciatica 02/17/2020   Intractable nausea and vomiting 05/01/2019   Anxiety and depression 07/30/2017   CAD (coronary artery disease) 07/30/2017   Hot flashes due to surgical menopause 07/30/2017   Insomnia 07/30/2017   Osteoporosis 02/27/2016   Migraine 06/07/2015   Seizures ([place]) 06/07/2015   Gastroesophageal reflux disease without esophagitis 06/07/2015   COPD (chronic obstructive pulmonary disease) with chronic bronchitis ([place]) 08/14/2014   Essential (primary) hypertension 08/14/2014   Pure hypercholesterolemia 08/14/2014    Past Surgical History:  Procedure Laterality Date   APPENDECTOMY  1984   CATARACT EXTRACTION, BILATERAL Bilateral    COLONOSCOPY WITH PROPOFOL N/A 03/26/2017   Procedure: COLONOSCOPY WITH PROPOFOL;  Surgeon: Jonathon Bellows, MD;  [facility] END[facility];  Service: Gastroenterology;  Laterality: N/A;   TOTAL ABDOMINAL HYSTERECTOMY  1984   Total   TOTAL HIP ARTHROPLASTY Right 11/19/2020   Procedure: RIGHT TOTAL HIP ARTHROPLASTY ANTERIOR APPROACH;  Surgeon: Leandrew Koyanagi, MD;  Location: [place];  Service: Orthopedics;  Laterality: Right;  C-3   VENTRAL HERNIA REPAIR N/A 09/16/2019   Procedure: HERNIA REPAIR VENTRAL ADULT, Open;  Surgeon: Fredirick Maudlin, MD;  Location: [facility] ORS;  Service: General;  Laterality: N/A;   WISDOM TOOTH EXTRACTION      Family History  Problem Relation Age of Onset   Stroke Mother    Depression Mother    Arthritis Mother    Heart disease Father    Alcohol abuse Father    Diabetes Father    Osteoporosis Sister    Kidney disease Sister     Arthritis Sister    Dementia Sister    Arthritis Maternal Grandmother    Heart disease Maternal Grandmother    Hyperlipidemia Maternal Grandmother    Diabetes Paternal Grandmother    Breast cancer Neg Hx     Social History   Tobacco Use   Smoking status: Every Day    Packs/day: 1.00    Years: 45.00    Total pack years: 45.00    Types: Cigarettes   Smokeless tobacco: Never   Tobacco comments:    reports quitting in feb 2021  Substance Use Topics   Alcohol use: No     Current Outpatient Medications:    diazepam (VALIUM) 5 MG tablet, Take 5 mg by mouth 3 (three) times daily as needed., Disp: , Rfl:    gabapentin (NEURONTIN) 100 MG capsule, Take 1 capsule (100 mg total) by mouth 3 (three) times daily., Disp: 90 capsule, Rfl: 0   lamoTRIgine (LAMICTAL) 100 MG tablet, Take 1 tablet (100 mg total) by mouth 2 (two) times daily., Disp: 180 tablet, Rfl: 3   ondansetron (ZOFRAN) 4 MG tablet, TAKE 1 TABLET BY MOUTH EVERY 8 HOURS AS NEEDED FOR NAUSEA OR VOMITING, Disp: 40 tablet, Rfl: 0   pantoprazole (PROTONIX) 40 MG tablet, TAKE 1 TABLET BY MOUTH TWICE A DAY, Disp: 180 tablet, Rfl: 1   [name] 1.25 MG tablet, TAKE 1 TABLET BY MOUTH DAILY, Disp: 90 tablet, Rfl: 3   rizatriptan (MAXALT-MLT) 10 MG disintegrating tablet, Take 1 tablet (10 mg total) by mouth as needed for migraine. May repeat in 2 hours if needed, Disp: 9 tablet, Rfl: 11   simvastatin (ZOCOR) 20 MG tablet, TAKE 1 TABLET BY MOUTH DAILY AT 6 PM, Disp: 90 tablet, Rfl: 1   verapamil (CALAN-SR) 240 MG CR tablet, TAKE 1 TABLET BY MOUTH DAILY, Disp: 90 tablet, Rfl: 1   vitamin B-12 (CYANOCOBALAMIN) 500 MCG tablet, Take 500 mcg by mouth daily., Disp: , Rfl:    famotidine (PEPCID) 20 MG tablet, Take 1 tablet (20 mg total) by mouth 2 (two) times daily for 7 days., Disp: 14 tablet, Rfl: 0   fluocinonide ointment (LIDEX) 0.05 %, Apply topically., Disp: , Rfl:    oxyCODONE-acetaminophen (PERCOCET/ROXICET) 5-325 MG tablet, TAKE 1 TABLET BY  MOUTH EVERY 6 HOURS AS NEEDED FOR SEVERE PAIN FOR UP TO 3 DAYS, Disp: , Rfl:   Allergies  Allergen Reactions   Codeine Nausea And Vomiting   Other     No Anti Depressants - "makes me crazy"   Fluoxetine Anxiety   Paroxetine Anxiety    Becomes very aggressive with mood swings.    I personally reviewed active problem list, medication list, allergies, health maintenance, notes from last encounter, lab results with the patient/caregiver today.   Review of Systems  Constitutional:  Negative for chills, diaphoresis and fever.  Eyes:  Negative for blurred vision and double vision.  Respiratory:  Negative for shortness of breath and wheezing.  Cardiovascular:  Negative for chest pain, palpitations and leg swelling.  Neurological:  Negative for dizziness and headaches.      Objective  Vitals:   08/09/21 0900 08/09/21 0906  BP: (!) 171/105 (!) 174/94  Pulse: 81 80  Temp: 98 F (36.7 C)   TempSrc: Oral   SpO2: 99%   Weight: 134 lb 6.4 oz (61 kg)   Height: 5' 5.35" (1.66 m)     Body mass index is 22.12 kg/m.  Physical Exam Vitals reviewed.  Constitutional:      General: She is awake.     Appearance: Normal appearance. She is well-developed, well-groomed and normal weight.  HENT:     Head: Normocephalic and atraumatic.  Cardiovascular:     Rate and Rhythm: Normal rate and regular rhythm.     Pulses: Normal pulses.          Radial pulses are 2+ on the right side and 2+ on the left side.     Heart sounds: Normal heart sounds.  Pulmonary:     Effort: Pulmonary effort is normal.     Breath sounds: Normal breath sounds. No decreased breath sounds, wheezing, rhonchi or rales.  Musculoskeletal:     Cervical back: Normal range of motion and neck supple.     Right lower leg: No edema.     Left lower leg: No edema.  Neurological:     General: No focal deficit present.     Mental Status: She is alert and oriented to person, place, and time.     GCS: GCS eye subscore is 4. GCS  verbal subscore is 5. GCS motor subscore is 6.     Cranial Nerves: No dysarthria or facial asymmetry.     Motor: No tremor.     Gait: Gait is intact.  Psychiatric:        Attention and Perception: Attention normal.        Mood and Affect: Mood and affect normal.        Speech: Speech normal.        Behavior: Behavior normal. Behavior is cooperative.      No results found for this or any previous visit (from the past 2160 hour(s)).   PHQ2/9:    08/09/2021   10:13 AM 07/02/2021    1:59 PM 02/18/2021   10:03 AM 02/01/2021    8:36 AM 08/30/2020    9:41 AM  Depression screen PHQ 2/9  Decreased Interest 0 0 0 0 0  Down, Depressed, Hopeless 0 0 0 0 0  PHQ - 2 Score 0 0 0 0 0  Altered sleeping 0 0 0 0 0  Tired, decreased energy 0 0 0 0 0  Change in appetite 0 0 0 0 0  Feeling bad or failure about yourself  0 0 0 0 0  Trouble concentrating 0 0 0 0 0  Moving slowly or fidgety/restless 0 0 0 0 0  Suicidal thoughts 0 0  0 0  PHQ-9 Score 0 0 0 0 0  Difficult doing work/chores Not difficult at all Not difficult at all Not difficult at all Not difficult at all       Fall Risk:    08/09/2021   10:13 AM 07/02/2021    1:58 PM 02/01/2021    8:36 AM 08/30/2020    9:40 AM 05/31/2020    3:31 PM  Fall Risk   Falls in the past year? 1 1 0 1 1  Number falls in past yr:  1 1 0 1 1  Injury with Fall? 0 0 0 1 1  Risk for fall due to : History of fall(s) History of fall(s) No Fall Risks History of fall(s);Impaired balance/gait Impaired balance/gait;Impaired mobility;Orthopedic patient  Follow up Falls evaluation completed Falls evaluation completed Falls evaluation completed Falls evaluation completed Falls evaluation completed      Functional Status Survey:      Assessment & Plan  Problem List Items Addressed This Visit       Cardiovascular and Mediastinum   Essential (primary) hypertension    Chronic, historic condition  Potentially exacerbated as BP was elevated today in office and  she reports fluctuations at home  Recommend she take BP daily for one month and bring record follow up in 4 weeks If still elevated will start Valsartan 40 mg PO QD to assist with control  For now continue current medications Follow up in 4 weeks to discuss.         Musculoskeletal and Integument   Osteoporosis    History of osteoporosis  Most recent DEXA was 2018, orders placed today for repeat Will make recommendations based on results        Relevant Orders   DG Bone Density     Other   Pure hypercholesterolemia    Chronic, historic condition She is taking Simvastatin 20 mg PO QD and appears to be tolerating well Recheck lipid panel - results to dictate management Follow up in 6 months for monitoring unless results demonstrate significant changes to regimen are required       Relevant Orders   Lipid Profile   Anxiety and depression    Chronic, historic condition Reports she is following psychiatry and appears to be in therapy services as well per her HPI Continue with this for now       Hot flashes due to surgical menopause - Primary    Chronic, historic condition, appears exacerbated Will test TSH, FSH, CMP, CBC for rule out Recommend trying Gabapentin 100 mg 1-2 tablets PO QHS to assist with symptoms  Discussed Paroxetine but she has allergy listed.  Follow up in 4 weeks to discuss response to Gabapentin       Relevant Medications   gabapentin (NEURONTIN) 100 MG capsule   Other Relevant Orders   TSH   Comp Met (CMET)   FSH   CBC w/Diff   Vitamin B12 deficiency   Relevant Orders   B12   Other Visit Diagnoses     Encounter for screening mammogram for malignant neoplasm of breast       Relevant Orders   MM 3D SCREEN BREAST BILATERAL        Return in about 4 weeks (around 09/06/2021) for HTN.   I, Bayley [place] E Khali Perella, PA-C, have reviewed all documentation for this visit. The documentation on 08/09/21 for the exam, diagnosis, procedures, and orders are all  accurate and complete.   Talitha Givens, MHS, PA-C [facility]

## 2021-08-09 NOTE — Assessment & Plan Note (Signed)
Chronic, historic condition  Potentially exacerbated as BP was elevated today in office and she reports fluctuations at home  Recommend she take BP daily for one month and bring record follow up in 4 weeks If still elevated will start Valsartan 40 mg PO QD to assist with control  For now continue current medications Follow up in 4 weeks to discuss.

## 2021-08-09 NOTE — Assessment & Plan Note (Signed)
History of osteoporosis  Most recent DEXA was 2018, orders placed today for repeat Will make recommendations based on results

## 2021-08-09 NOTE — Patient Instructions (Addendum)
   Please call the [facility] to schedule your mammogram and bone density scan  Try taking one to two Gabapentin 100 mg at night to see if this improves your hot flashes Try taking one at first and you can increase to two if needed.   Once we get your lab results back we can make adjustments as indicated  Please make a lab apt to get your labs done. These should be done fasting ( nothing but water to eat or drink for at least 8 hours prior to the the lab draw, you can take your medications as you normally would)   I am adding Valsartan to your regimen to help with your high blood pressure.  Please take this in the morning for now .  Please keep a log of your BP daily and bring to the follow up in 4 weeks   Keep taking all of your other medications as directed.   It was nice to meet you and I appreciate the opportunity to be involved in your care

## 2021-08-09 NOTE — Assessment & Plan Note (Signed)
Chronic, historic condition, appears exacerbated Will test TSH, FSH, CMP, CBC for rule out Recommend trying Gabapentin 100 mg 1-2 tablets PO QHS to assist with symptoms  Discussed Paroxetine but she has allergy listed.  Follow up in 4 weeks to discuss response to Gabapentin

## 2021-08-09 NOTE — Assessment & Plan Note (Signed)
Chronic, historic condition She is taking Simvastatin 20 mg PO QD and appears to be tolerating well Recheck lipid panel - results to dictate management Follow up in 6 months for monitoring unless results demonstrate significant changes to regimen are required

## 2021-08-09 NOTE — Assessment & Plan Note (Signed)
Chronic, historic condition Reports she is following psychiatry and appears to be in therapy services as well per her HPI Continue with this for now

## 2021-08-12 ENCOUNTER — Ambulatory Visit: Payer: Self-pay

## 2021-08-12 NOTE — Telephone Encounter (Signed)
  Chief Complaint: Pt cannot take gabapentin. Needs pain medication, wants to see if another medication would work as premarin is not. Symptoms:  Frequency: ongoing Pertinent Negatives: Patient denies  Disposition: '[]'$ ED /'[]'$ Urgent Care (no appt availability in office) / '[x]'$ Appointment(In office/virtual)/ '[]'$  [place] [facility]/ '[]'$ Home Care/ '[]'$ Refused Recommended Disposition /'[]'$ [place] [facility]/ '[]'$  Follow-up with PCP Additional Notes: Pt was given gabapentin for pain and pt cannot take this medication as it"knocks her out". Pt has a small supply of '5mg'$  percocet which she breaks in half and uses for pain. This works well for her. Pt would like more percocet. Pt states that she is allergic to codeine. Pt would also like to discuss a different medication for her post menopausal s/s. She states that the premarin is not working.    Summary: pt states med given is making her extremly drunk   Pt has called in re gabapentin (NEURONTIN) 100 MG capsule just given on Friday. Pt states she cannot take as making her extremely "drunk" fu to advise [phone]      Reason for Disposition  [1] Caller has NON-URGENT medicine question about med that PCP prescribed AND [2] triager unable to answer question  Answer Assessment - Initial Assessment Questions 1. NAME of MEDICATION: "What medicine are you calling about?"     Gabapentin 2. QUESTION: "What is your question?" (e.g., double dose of medicine, side effect)     Does not want this medication any longer.  3. PRESCRIBING HCP: "Who prescribed it?" Reason: if prescribed by specialist, call should be referred to that group.      4. SYMPTOMS: "Do you have any symptoms?"      5. SEVERITY: If symptoms are present, ask "Are they mild, moderate or severe?"     Makes her feel "zonked", and "knocks her out. 6. PREGNANCY:  "Is there any chance that you are pregnant?" "When was your last menstrual period?"     na  Protocols used: Medication Question  Call-A-AH

## 2021-08-14 ENCOUNTER — Other Ambulatory Visit: Payer: Self-pay

## 2021-08-14 NOTE — Telephone Encounter (Signed)
Patient was last seen 08/09/21 by Junie Panning, has appointment on 08/20/21.

## 2021-08-19 ENCOUNTER — Telehealth: Payer: Self-pay | Admitting: Family Medicine

## 2021-08-19 ENCOUNTER — Other Ambulatory Visit: Payer: Self-pay

## 2021-08-19 NOTE — Telephone Encounter (Signed)
[name], I don't see where we have prescribed zofran for her in the past. Last seen 09/19/20 and her next appt is 09/19/21

## 2021-08-19 NOTE — Telephone Encounter (Signed)
Called pt back. Relayed [name]'s message. Pt states she tried discussing w/ PCP, Erin Mecum but she was not comfortable refilling zofran. She thought she would ask [place] to refill d/t this. She is going to go back to PCP to discuss ongoing refills since this is who refilled it for her in the past ffor GERD it appears.   She has tried dramamine but ineffective. Has had no seizures, stable on lamotrigine otherwise. Has only had 2 migraines in the last yr. BP and cholesterol stable.

## 2021-08-19 NOTE — Telephone Encounter (Signed)
Patient called in requesting a prescription for Zofran. Stated she is experiencing nausea in the mornings after taking her LAMICTAL and would like something to help with that. States Zofran is the only thing that has worked in the past for her.

## 2021-08-20 ENCOUNTER — Ambulatory Visit: Payer: Medicare Other | Admitting: Physician Assistant

## 2021-08-20 ENCOUNTER — Other Ambulatory Visit: Payer: Self-pay | Admitting: Physician Assistant

## 2021-08-21 NOTE — Telephone Encounter (Signed)
Requested medication (s) are due for refill today: yes  Requested medication (s) are on the active medication list: yes    Last refill: 03/12/21   #40  0 refills  Future visit scheduled yes 09/06/21  Notes to clinic:Not delegated, please review. Thank you.  Requested Prescriptions  Pending Prescriptions Disp Refills   ondansetron (ZOFRAN) 4 MG tablet [Pharmacy Med Name: ONDANSETRON HCL 4 MG TAB] 40 tablet 0    Sig: TAKE 1 TABLET BY MOUTH EVERY 8 HOURS AS NEEDED FOR NAUSEA OR VOMITING     Not Delegated - Gastroenterology: Antiemetics - ondansetron Failed - 08/20/2021  9:39 AM      Failed - This refill cannot be delegated      Passed - AST in normal range and within 360 days    AST  Date Value Ref Range Status  02/01/2021 13 0 - 40 IU/L Final   SGOT(AST)  Date Value Ref Range Status  08/28/2013 23 15 - 37 Unit/L Final         Passed - ALT in normal range and within 360 days    ALT  Date Value Ref Range Status  02/01/2021 7 0 - 32 IU/L Final   SGPT (ALT)  Date Value Ref Range Status  08/28/2013 16 U/L Final    Comment:    14-63 NOTE: New Reference Range 08/23/13          Passed - Valid encounter within last 6 months    Recent Outpatient Visits           1 week ago Hot flashes due to surgical menopause   [facility] Mecum, Dani Gobble, PA-C   1 month ago Neck swelling   [place], MD   6 months ago Aortic atherosclerosis ([place])   [place], Lauren A, NP   9 months ago Osteoarthritis of right hip, unspecified osteoarthritis type   [facility] McElwee, Lauren A, NP   11 months ago Urinary frequency   [place], Lauren A, NP       Future Appointments             In 2 weeks Mecum, Dani Gobble, PA-C [employer], [facility]

## 2021-09-06 ENCOUNTER — Ambulatory Visit: Payer: Medicare Other | Admitting: Physician Assistant

## 2021-09-10 ENCOUNTER — Ambulatory Visit (INDEPENDENT_AMBULATORY_CARE_PROVIDER_SITE_OTHER): Payer: Medicare Other | Admitting: Unknown Physician Specialty

## 2021-09-10 ENCOUNTER — Encounter: Payer: Self-pay | Admitting: Unknown Physician Specialty

## 2021-09-10 ENCOUNTER — Telehealth: Payer: Self-pay | Admitting: *Deleted

## 2021-09-10 DIAGNOSIS — I10 Essential (primary) hypertension: Secondary | ICD-10-CM | POA: Diagnosis not present

## 2021-09-10 DIAGNOSIS — I5022 Chronic systolic (congestive) heart failure: Secondary | ICD-10-CM

## 2021-09-10 DIAGNOSIS — F17210 Nicotine dependence, cigarettes, uncomplicated: Secondary | ICD-10-CM

## 2021-09-10 DIAGNOSIS — Z636 Dependent relative needing care at home: Secondary | ICD-10-CM | POA: Diagnosis not present

## 2021-09-10 DIAGNOSIS — E7800 Pure hypercholesterolemia, unspecified: Secondary | ICD-10-CM | POA: Diagnosis not present

## 2021-09-10 MED ORDER — SIMVASTATIN 40 MG PO TABS: 40.0000 mg | ORAL_TABLET | Freq: Every day | ORAL | 3 refills | Status: DC

## 2021-09-10 MED ORDER — VALSARTAN 40 MG PO TABS: 40.0000 mg | ORAL_TABLET | Freq: Every day | ORAL | 3 refills | Status: DC

## 2021-09-10 NOTE — Assessment & Plan Note (Signed)
Refer for low dose CT

## 2021-09-10 NOTE — Assessment & Plan Note (Signed)
LDL not to goal with LDL 138.  Multiple risk factors.  Increase Simvistain from 20 mg to 40 mg

## 2021-09-10 NOTE — Telephone Encounter (Signed)
   Telephone encounter was:  Successful.  09/10/2021 Name: Kayla Shah [facility] MRN: [id number] DOB: Jul 31, 1955  Kayla Shah is a 66 y.o. year old female who is a primary care patient of Practice, [place] . The community resource team was consulted for assistance with Caregiver Stress  Care guide performed the following interventions: Patient provided with information about care guide support team and interviewed to confirm resource needs.  Follow Up Plan:  Care guide will follow up with patient by phone over the next days  [place] [phone] [address] , [place] [address] Email : [name]

## 2021-09-10 NOTE — Progress Notes (Signed)
BP (!) 161/91   Pulse 69   Temp 98.3 F (36.8 C) (Oral)   Ht 5' 5.35" (1.66 m)   Wt 132 lb (59.9 kg)   SpO2 100%   BMI 21.73 kg/m    Subjective:    Patient ID: Kayla Shah, female    DOB: Sep 18, 1955, 66 y.o.   MRN: [id number]  HPI: Kayla Shah is a 66 y.o. female  Chief Complaint  Patient presents with   Hypertension   Hypertension Using medications without difficulty Average home BPs States BP at home is never above    No problems or lightheadedness No chest pain with exertion or shortness of breath No Edema   Hyperlipidemia Using medications without problems: No Muscle aches  Diet compliance: Admits to not eating well Exercise: "not like I should" but does occasional walking Note that last LDL was 138  Last visit added Gabapentin for her hot flashes. This did not help.  Describes taking multiple showers at nigh   Relevant past medical, surgical, family and social history reviewed and updated as indicated. Interim medical history since our last visit reviewed. Allergies and medications reviewed and updated.  Review of Systems  Constitutional: Negative.   Respiratory: Negative.    Cardiovascular: Negative.   Psychiatric/Behavioral: Negative.      Per HPI unless specifically indicated above     Objective:    BP (!) 161/91   Pulse 69   Temp 98.3 F (36.8 C) (Oral)   Ht 5' 5.35" (1.66 m)   Wt 132 lb (59.9 kg)   SpO2 100%   BMI 21.73 kg/m   Wt Readings from Last 3 Encounters:  09/10/21 132 lb (59.9 kg)  08/09/21 134 lb 6.4 oz (61 kg)  07/02/21 141 lb 12.8 oz (64.3 kg)    Physical Exam Constitutional:      General: She is not in acute distress.    Appearance: Normal appearance. She is well-developed.  HENT:     Head: Normocephalic and atraumatic.  Eyes:     General: Lids are normal. No scleral icterus.       Right eye: No discharge.        Left eye: No discharge.     Conjunctiva/sclera: Conjunctivae normal.  Neck:      Vascular: No carotid bruit or JVD.  Cardiovascular:     Rate and Rhythm: Normal rate and regular rhythm.     Heart sounds: Normal heart sounds.  Pulmonary:     Effort: Pulmonary effort is normal. No respiratory distress.     Breath sounds: Normal breath sounds.  Abdominal:     Palpations: There is no hepatomegaly or splenomegaly.  Musculoskeletal:        General: Normal range of motion.     Cervical back: Normal range of motion and neck supple.  Skin:    General: Skin is warm and dry.     Coloration: Skin is not pale.     Findings: No rash.  Neurological:     Mental Status: She is alert and oriented to person, place, and time.  Psychiatric:        Behavior: Behavior normal.        Thought Content: Thought content normal.        Judgment: Judgment normal.     Results for orders placed or performed in visit on 02/01/21  CBC with Differential/Platelet  Result Value Ref Range   WBC 7.5 3.4 - 10.8 x10E3/uL   RBC 4.34 3.77 -  5.28 x10E6/uL   Hemoglobin 14.1 11.1 - 15.9 g/dL   Hematocrit 42.4 34.0 - 46.6 %   MCV 98 (H) 79 - 97 fL   MCH 32.5 26.6 - 33.0 pg   MCHC 33.3 31.5 - 35.7 g/dL   RDW 12.1 11.7 - 15.4 %   Platelets 242 150 - 450 x10E3/uL   Neutrophils 73 Not Estab. %   Lymphs 21 Not Estab. %   Monocytes 5 Not Estab. %   Eos 1 Not Estab. %   Basos 0 Not Estab. %   Neutrophils Absolute 5.4 1.4 - 7.0 x10E3/uL   Lymphocytes Absolute 1.6 0.7 - 3.1 x10E3/uL   Monocytes Absolute 0.4 0.1 - 0.9 x10E3/uL   EOS (ABSOLUTE) 0.0 0.0 - 0.4 x10E3/uL   Basophils Absolute 0.0 0.0 - 0.2 x10E3/uL   Immature Granulocytes 0 Not Estab. %   Immature Grans (Abs) 0.0 0.0 - 0.1 x10E3/uL  Comprehensive metabolic panel  Result Value Ref Range   Glucose 95 70 - 99 mg/dL   BUN 9 8 - 27 mg/dL   Creatinine, Ser 0.87 0.57 - 1.00 mg/dL   eGFR 74 >59 mL/min/1.73   BUN/Creatinine Ratio 10 (L) 12 - 28   Sodium 140 134 - 144 mmol/L   Potassium 4.7 3.5 - 5.2 mmol/L   Chloride 101 96 - 106 mmol/L    CO2 26 20 - 29 mmol/L   Calcium 9.5 8.7 - 10.3 mg/dL   Total Protein 7.4 6.0 - 8.5 g/dL   Albumin 4.6 3.8 - 4.8 g/dL   Globulin, Total 2.8 1.5 - 4.5 g/dL   Albumin/Globulin Ratio 1.6 1.2 - 2.2   Bilirubin Total <0.2 0.0 - 1.2 mg/dL   Alkaline Phosphatase 88 44 - 121 IU/L   AST 13 0 - 40 IU/L   ALT 7 0 - 32 IU/L  Vitamin B12  Result Value Ref Range   Vitamin B-12 1,202 232 - 1,245 pg/mL  Lipid Panel w/o Chol/HDL Ratio  Result Value Ref Range   Cholesterol, Total 239 (H) 100 - 199 mg/dL   Triglycerides 115 0 - 149 mg/dL   HDL 81 >39 mg/dL   VLDL Cholesterol Cal 20 5 - 40 mg/dL   LDL Chol Calc (NIH) 138 (H) 0 - 99 mg/dL      Assessment & Plan:   Problem List Items Addressed This Visit       Unprioritized   Caregiver stress    Refer to social work for resources      Relevant Orders   AMB Referral to [facility]   Chronic HFrEF (heart failure with reduced ejection fraction) ([place])    Reviewed echo from 2020 with EF of 45-50%.  Asymptomatic but ACE/ARB would be recommended.        Relevant Medications   simvastatin (ZOCOR) 40 MG tablet   [name] 40 MG tablet   Cigarette nicotine dependence without complication    Refer for low dose CT      Essential hypertension    New diagnosis.  Start Valsartan.  Recheck in 1 month      Relevant Medications   simvastatin (ZOCOR) 40 MG tablet   [name] 40 MG tablet   Other Relevant Orders   Ambulatory Referral Lung Cancer Screening [facility]   Pure hypercholesterolemia    LDL not to goal with LDL 138.  Multiple risk factors.  Increase Simvistain from 20 mg to 40 mg      Relevant Medications   simvastatin (ZOCOR)  40 MG tablet   [name] 40 MG tablet   Other Relevant Orders   Ambulatory Referral Lung Cancer Screening [facility]     Follow up plan: Return in about 4 weeks (around 10/08/2021).

## 2021-09-10 NOTE — Assessment & Plan Note (Signed)
New diagnosis.  Start Valsartan.  Recheck in 1 month

## 2021-09-10 NOTE — Patient Instructions (Signed)
Ashwaghanda

## 2021-09-10 NOTE — Assessment & Plan Note (Addendum)
Reviewed echo from 2020 with EF of 45-50%.  Asymptomatic but ACE/ARB would be recommended.

## 2021-09-10 NOTE — Assessment & Plan Note (Signed)
Refer to social work for resources

## 2021-09-11 ENCOUNTER — Telehealth: Payer: Self-pay | Admitting: Family

## 2021-09-11 ENCOUNTER — Telehealth: Payer: Self-pay | Admitting: *Deleted

## 2021-09-11 NOTE — Telephone Encounter (Signed)
Copied from [place] [id number]. Topic: Referral - Question >> Sep 11, 2021 10:46 AM Tiffany B wrote: Referral Request - Has patient seen PCP for this complaint? Yes.    Referral for which specialty: Lung screening specialist  Preferred provider/office: Patient would like referral placed at a [place] location instead of a [place] location  Reason for referral:  Lung screening

## 2021-09-11 NOTE — Telephone Encounter (Signed)
   Telephone encounter was:  Unsuccessful.  09/11/2021 Name: Kayla Shah [facility] MRN: [id number] DOB: December 16, 1955  Unsuccessful outbound call made today to assist with:  Caregiver Stress  Outreach Attempt:  2nd Attempt  A HIPAA compliant voice message was left requesting a return call.  Instructed patient to call back at [phone].  [place] [phone] [address] , [place] [address] Email : [name]

## 2021-09-16 NOTE — Telephone Encounter (Signed)
I don;t see any referral for this- can we please get a bit more information?

## 2021-09-17 ENCOUNTER — Telehealth: Payer: Self-pay | Admitting: *Deleted

## 2021-09-17 NOTE — Telephone Encounter (Signed)
   Telephone encounter was:  Successful.  09/17/2021 Name: Annison Birchard [facility] MRN: [id number] DOB: 1955/12/23  Mikaylee Arseneau [place] is a 66 y.o. year old female who is a primary care patient of [facility], [place] . The community resource team was consulted for assistance with Caregiver Stress  Care guide performed the following interventions: Patient provided with information about care guide support team and interviewed to confirm resource needs Follow up call placed to community resources to determine status of patients referral.  Follow Up Plan:  No further follow up planned at this time. The patient has been provided with needed resources.  [place] [phone] [address] , [place] [address] Email : [name] [email or web address]

## 2021-09-19 ENCOUNTER — Ambulatory Visit: Payer: Medicare Other | Admitting: Family Medicine

## 2021-09-24 ENCOUNTER — Other Ambulatory Visit: Payer: Self-pay | Admitting: Neurology

## 2021-10-15 ENCOUNTER — Ambulatory Visit (INDEPENDENT_AMBULATORY_CARE_PROVIDER_SITE_OTHER): Payer: Medicare Other | Admitting: Unknown Physician Specialty

## 2021-10-15 ENCOUNTER — Encounter: Payer: Self-pay | Admitting: Unknown Physician Specialty

## 2021-10-15 DIAGNOSIS — Z636 Dependent relative needing care at home: Secondary | ICD-10-CM | POA: Diagnosis not present

## 2021-10-15 DIAGNOSIS — I10 Essential (primary) hypertension: Secondary | ICD-10-CM | POA: Diagnosis not present

## 2021-10-15 NOTE — Assessment & Plan Note (Signed)
Doing well with Valsartan 40 mg.  BP is stable.  Check BMP

## 2021-10-15 NOTE — Assessment & Plan Note (Signed)
Getting some support.  Continue follow through with the social worker

## 2021-10-15 NOTE — Progress Notes (Signed)
BP 117/73   Pulse 62   Temp 98.1 F (36.7 C) (Oral)   Ht 5' 5.35" (1.66 m)   Wt 127 lb 14.4 oz (58 kg)   SpO2 98%   BMI 21.05 kg/m    Subjective:    Patient ID: Kayla Shah, female    DOB: 1955-03-01, 66 y.o.   MRN: [id number]  HPI: Kayla Shah is a 66 y.o. female  Chief Complaint  Patient presents with   Hypertension   Hypertension Using medications without difficulty Average home BPs: not checking   No problems or lightheadedness No chest pain with exertion or shortness of breath No Edema  Caregiver stress:  Full time careegiver: Pt is talking to the social worker which is helpful.    Relevant past medical, surgical, family and social history reviewed and updated as indicated. Interim medical history since our last visit reviewed. Allergies and medications reviewed and updated.  Review of Systems  Constitutional: Negative.   Respiratory: Negative.    Cardiovascular: Negative.   Psychiatric/Behavioral: Negative.      Per HPI unless specifically indicated above     Objective:    BP 117/73   Pulse 62   Temp 98.1 F (36.7 C) (Oral)   Ht 5' 5.35" (1.66 m)   Wt 127 lb 14.4 oz (58 kg)   SpO2 98%   BMI 21.05 kg/m   Wt Readings from Last 3 Encounters:  10/15/21 127 lb 14.4 oz (58 kg)  09/10/21 132 lb (59.9 kg)  08/09/21 134 lb 6.4 oz (61 kg)    Physical Exam Constitutional:      General: She is not in acute distress.    Appearance: Normal appearance. She is well-developed.  HENT:     Head: Normocephalic and atraumatic.  Eyes:     General: Lids are normal. No scleral icterus.       Right eye: No discharge.        Left eye: No discharge.     Conjunctiva/sclera: Conjunctivae normal.  Neck:     Vascular: No carotid bruit or JVD.  Cardiovascular:     Rate and Rhythm: Normal rate and regular rhythm.     Heart sounds: Normal heart sounds.  Pulmonary:     Effort: Pulmonary effort is normal.     Breath sounds: Normal breath sounds.   Abdominal:     Palpations: There is no hepatomegaly or splenomegaly.  Musculoskeletal:        General: Normal range of motion.     Cervical back: Normal range of motion and neck supple.  Skin:    General: Skin is warm and dry.     Coloration: Skin is not pale.     Findings: No rash.  Neurological:     Mental Status: She is alert and oriented to person, place, and time.  Psychiatric:        Behavior: Behavior normal.        Thought Content: Thought content normal.        Judgment: Judgment normal.     Results for orders placed or performed in visit on 02/01/21  CBC with Differential/Platelet  Result Value Ref Range   WBC 7.5 3.4 - 10.8 x10E3/uL   RBC 4.34 3.77 - 5.28 x10E6/uL   Hemoglobin 14.1 11.1 - 15.9 g/dL   Hematocrit 42.4 34.0 - 46.6 %   MCV 98 (H) 79 - 97 fL   MCH 32.5 26.6 - 33.0 pg   MCHC 33.3 31.5 - 35.7  g/dL   RDW 12.1 11.7 - 15.4 %   Platelets 242 150 - 450 x10E3/uL   Neutrophils 73 Not Estab. %   Lymphs 21 Not Estab. %   Monocytes 5 Not Estab. %   Eos 1 Not Estab. %   Basos 0 Not Estab. %   Neutrophils Absolute 5.4 1.4 - 7.0 x10E3/uL   Lymphocytes Absolute 1.6 0.7 - 3.1 x10E3/uL   Monocytes Absolute 0.4 0.1 - 0.9 x10E3/uL   EOS (ABSOLUTE) 0.0 0.0 - 0.4 x10E3/uL   Basophils Absolute 0.0 0.0 - 0.2 x10E3/uL   Immature Granulocytes 0 Not Estab. %   Immature Grans (Abs) 0.0 0.0 - 0.1 x10E3/uL  Comprehensive metabolic panel  Result Value Ref Range   Glucose 95 70 - 99 mg/dL   BUN 9 8 - 27 mg/dL   Creatinine, Ser 0.87 0.57 - 1.00 mg/dL   eGFR 74 >59 mL/min/1.73   BUN/Creatinine Ratio 10 (L) 12 - 28   Sodium 140 134 - 144 mmol/L   Potassium 4.7 3.5 - 5.2 mmol/L   Chloride 101 96 - 106 mmol/L   CO2 26 20 - 29 mmol/L   Calcium 9.5 8.7 - 10.3 mg/dL   Total Protein 7.4 6.0 - 8.5 g/dL   Albumin 4.6 3.8 - 4.8 g/dL   Globulin, Total 2.8 1.5 - 4.5 g/dL   Albumin/Globulin Ratio 1.6 1.2 - 2.2   Bilirubin Total <0.2 0.0 - 1.2 mg/dL   Alkaline Phosphatase 88 44 -  121 IU/L   AST 13 0 - 40 IU/L   ALT 7 0 - 32 IU/L  Vitamin B12  Result Value Ref Range   Vitamin B-12 1,202 232 - 1,245 pg/mL  Lipid Panel w/o Chol/HDL Ratio  Result Value Ref Range   Cholesterol, Total 239 (H) 100 - 199 mg/dL   Triglycerides 115 0 - 149 mg/dL   HDL 81 >39 mg/dL   VLDL Cholesterol Cal 20 5 - 40 mg/dL   LDL Chol Calc (NIH) 138 (H) 0 - 99 mg/dL      Assessment & Plan:   Problem List Items Addressed This Visit       Unprioritized   Caregiver stress    Getting some support.  Continue follow through with the social worker      Essential hypertension    Doing well with Valsartan 40 mg.  BP is stable.  Check BMP      Relevant Orders   Comprehensive metabolic panel     Follow up plan: Return in about 3 months (around 01/14/2022).

## 2021-10-16 LAB — COMPREHENSIVE METABOLIC PANEL
ALT: 8 IU/L (ref 0–32)
AST: 10 IU/L (ref 0–40)
Albumin/Globulin Ratio: 2.2 (ref 1.2–2.2)
Albumin: 4.9 g/dL (ref 3.9–4.9)
Alkaline Phosphatase: 74 IU/L (ref 44–121)
BUN/Creatinine Ratio: 15 (ref 12–28)
BUN: 16 mg/dL (ref 8–27)
Bilirubin Total: 0.3 mg/dL (ref 0.0–1.2)
CO2: 26 mmol/L (ref 20–29)
Calcium: 9.6 mg/dL (ref 8.7–10.3)
Chloride: 97 mmol/L (ref 96–106)
Creatinine, Ser: 1.06 mg/dL — ABNORMAL HIGH (ref 0.57–1.00)
Globulin, Total: 2.2 g/dL (ref 1.5–4.5)
Glucose: 95 mg/dL (ref 70–99)
Potassium: 4 mmol/L (ref 3.5–5.2)
Sodium: 138 mmol/L (ref 134–144)
Total Protein: 7.1 g/dL (ref 6.0–8.5)
eGFR: 58 mL/min/{1.73_m2} — ABNORMAL LOW (ref >59)

## 2021-10-17 ENCOUNTER — Telehealth: Payer: Self-pay

## 2021-10-17 NOTE — Telephone Encounter (Signed)
Left VM for patient to call to schedule LDCT.  Previous [place] patient. Has completed sdmv.  Last LDCT 2019

## 2021-10-21 ENCOUNTER — Telehealth: Payer: Self-pay | Admitting: *Deleted

## 2021-10-21 NOTE — Telephone Encounter (Signed)
Patient called for results and was notified:  Call tell labs are good. Except kidney function a little low.  I am not concerned but we should recheck in 6 months.  A GFR of below 60 is considered low, but it is not at the level of being dangerous.  If it drops below 50, I like to send you to a kidney specialist.  We would like to continue to monitor your levels every 3-6 months.  Things we can do is to keep blood pressure and diabetes at goal.  Over the counter prescriptions medications such as Ibuprofen (Advil) or Naproxyn (Aleve) can be damaging to your kidneys and try to avoid those as much as possible.  Tylenol or Acetaminaphine or typically safer choices.    Patient will stop Ibuprofen- she takes a lot.

## 2021-10-21 NOTE — Progress Notes (Deleted)
PATIENT: Kayla Shah DOB: Mar 17, 1955  REASON FOR VISIT: follow up HISTORY FROM: patient  No chief complaint on file.    HISTORY OF PRESENT ILLNESS:  10/21/21 ALL:  Kayla Shah returns for follow up for seizures and migraines. She continues lamotrigine '100mg'$  twice daily.   09/19/2020 ALL: Kayla Shah returns for follow up for seizures and migraines. She reports doing very well. She continues lamotrigine '100mg'$  BID and rizatriptan as needed. She rarely has headaches. She can not remember the last time she needed rizatriptan. She feels tremor is better off divalproex. Some action tremor, especially if anxious or tired. She denies seizure activity. She is having right hip pain. She is trying to find a surgeon who will consider hip replacement.   09/20/2019 ALL:  Kayla Shah is a 66 y.o. female here today for follow up for seizures and migraines. She reports doing well. She has tolerated lamotrigine well. She feels that tremor has significantly improved since discontinuing divalproex. She can write again. She can hold a coffee cup. She feels that migraines are well managed. She can't remember the last time she took rizatriptan. She is very happy with current treatment regimen. She is also very proud to report that she recently quit smoking. She is feeling better physically.   04/27/2019 ALL: Kayla Shah is a 66 y.o. female here today for follow up for seizures. She continues divalproex '750mg'$  twice daily. She reports that tremors have significantly worsened. She is unable to hold a coffee cup. She has had significant weight loss as well. She does endorse depression and anxiety but has been followed closely by PCP and decline medications for this. She denies difficulty swallowing. No changes in gait. She denies changes in movements with exception of tremors. No family history of PD. Symptoms have progressively worsened since starting divalproex in May 2020. No seizure activity. She does not  drive. PCP started Megace last month. Headaches are well managed at this time.    09/06/2018 ALL:  Kayla Shah [place] is a 66 y.o. female here today for follow up for seizure. She was admitted to [facility] on 06/12/2018 following two seizures at home. Depakote was subtherapeutic on admission. She denies missed doses. Dose was increased to '750mg'$  twice daily. She has noticed an increase in tremor since increasing dose but is tolerating well otherwise. She denies seizure activity. She is feeling well today and without complaints.    Upon review of ER notes, she had elevated troponin and abnormal EKG (inferior ST depression). Cardiology consulted and advised additional testing. She has extensive family history of cardiovascular disease. She is treated for HTN and told that lipids are elevated (not currently treated). She is smoking with no desire to quit.    HISTORY: (copied from Dr Gladstone Lighter note on 11/18/2017)   66 year old female here for evaluation of seizure and migraine.   Patient first developed seizures in 63s.  She was on Keppra at that time.  At some point she was taken off of Keppra.  2014 she was hospitalized for seizure.  She may have been on and off of Keppra following 2014 but she is not sure.  Patient reports grand mal seizures and postictal confusion symptoms but is not quite sure about the frequency.  She had another seizure possibly in September 2019.  She is not on antiseizure medication at this time.   Patient has history of migraine headaches at age 38 years old with pounding throbbing headaches, nausea, photophobia, typically unilateral right greater  than left-sided headaches.  She averages 4-5 headaches per month.  She is not sure what type of medication she has been on the past.   Patient also has history of depression, anxiety, stress, weight loss, chronic pain.     REVIEW OF SYSTEMS: Out of a complete 14 system review of symptoms, the patient complains only of the  following symptoms, hip pain and all other reviewed systems are negative.  ALLERGIES: Allergies  Allergen Reactions   Codeine Nausea And Vomiting   Other     No Anti Depressants - "makes me crazy"   Fluoxetine Anxiety   Paroxetine Anxiety    Becomes very aggressive with mood swings.    HOME MEDICATIONS: Outpatient Medications Prior to Visit  Medication Sig Dispense Refill   diazepam (VALIUM) 5 MG tablet Take 5 mg by mouth 3 (three) times daily as needed.     gabapentin (NEURONTIN) 100 MG capsule Take 1 capsule (100 mg total) by mouth 3 (three) times daily. 90 capsule 0   lamoTRIgine (LAMICTAL) 100 MG tablet TAKE 1 TABLET BY MOUTH TWICE A DAY 180 tablet 3   ondansetron (ZOFRAN) 4 MG tablet TAKE 1 TABLET BY MOUTH EVERY 8 HOURS AS NEEDED FOR NAUSEA OR VOMITING 20 tablet 0   oxyCODONE-acetaminophen (PERCOCET/ROXICET) 5-325 MG tablet TAKE 1 TABLET BY MOUTH EVERY 6 HOURS AS NEEDED FOR SEVERE PAIN FOR UP TO 3 DAYS     pantoprazole (PROTONIX) 40 MG tablet TAKE 1 TABLET BY MOUTH TWICE A DAY 180 tablet 1   rizatriptan (MAXALT-MLT) 10 MG disintegrating tablet Take 1 tablet (10 mg total) by mouth as needed for migraine. May repeat in 2 hours if needed 9 tablet 11   simvastatin (ZOCOR) 40 MG tablet Take 1 tablet (40 mg total) by mouth at bedtime. 90 tablet 3   [name] 40 MG tablet Take 1 tablet (40 mg total) by mouth daily. 90 tablet 3   verapamil (CALAN-SR) 240 MG CR tablet TAKE 1 TABLET BY MOUTH DAILY 90 tablet 1   vitamin B-12 (CYANOCOBALAMIN) 500 MCG tablet Take 500 mcg by mouth daily.     No facility-administered medications prior to visit.    PAST MEDICAL HISTORY: Past Medical History:  Diagnosis Date   Allergy    Anxiety    agoraphobia    Arthritis    Chicken pox    COPD (chronic obstructive pulmonary disease) ([place])    Depression    Family history of adverse reaction to anesthesia    PONV   GERD (gastroesophageal reflux disease)    Headache    History of blood  transfusion    1984   Hyperlipidemia    Hypertension    Migraine    PONV (postoperative nausea and vomiting)    Seizure ([place])    last seizure 04/2019    PAST SURGICAL HISTORY: Past Surgical History:  Procedure Laterality Date   APPENDECTOMY  1984   CATARACT EXTRACTION, BILATERAL Bilateral    COLONOSCOPY WITH PROPOFOL N/A 03/26/2017   Procedure: COLONOSCOPY WITH PROPOFOL;  Surgeon: Jonathon Bellows, MD;  Location: [facility] ENDOSCOPY;  Service: Gastroenterology;  Laterality: N/A;   TOTAL ABDOMINAL HYSTERECTOMY  1984   Total   TOTAL HIP ARTHROPLASTY Right 11/19/2020   Procedure: RIGHT TOTAL HIP ARTHROPLASTY ANTERIOR APPROACH;  Surgeon: Leandrew Koyanagi, MD;  Location: [place];  Service: Orthopedics;  Laterality: Right;  C-3   VENTRAL HERNIA REPAIR N/A 09/16/2019   Procedure: HERNIA REPAIR VENTRAL ADULT, Open;  Surgeon: Fredirick Maudlin, MD;  Location:  [facility] ORS;  Service: General;  Laterality: N/A;   WISDOM TOOTH EXTRACTION      FAMILY HISTORY: Family History  Problem Relation Age of Onset   Stroke Mother    Depression Mother    Arthritis Mother    Heart disease Father    Alcohol abuse Father    Diabetes Father    Osteoporosis Sister    Kidney disease Sister    Arthritis Sister    Dementia Sister    Arthritis Maternal Grandmother    Heart disease Maternal Grandmother    Hyperlipidemia Maternal Grandmother    Diabetes Paternal Grandmother    Breast cancer Neg Hx     SOCIAL HISTORY: Social History   Socioeconomic History   Marital status: Divorced    Spouse name: Legrand Como   Number of children: 0   Years of education: 12   Highest education level: Not on file  Occupational History    Comment: disabled  Tobacco Use   Smoking status: Every Day    Packs/day: 1.00    Years: 45.00    Total pack years: 45.00    Types: Cigarettes   Smokeless tobacco: Never   Tobacco comments:    reports quitting in feb 2021  Vaping Use   Vaping Use: Never used  Substance and Sexual Activity    Alcohol use: No   Drug use: Yes    Types: Marijuana    Comment: every day   Sexual activity: Never  Other Topics Concern   Not on file  Social History Narrative   Disabled    Married no kids    No guns, wears seat belt, feels safe in relationship    12th grade ed.    Social Determinants of Health   Financial Resource Strain: Medium Risk (09/17/2021)   Overall Financial Resource Strain (CARDIA)    Difficulty of Paying Living Expenses: Somewhat hard  Food Insecurity: No Food Insecurity (09/17/2021)   Hunger Vital Sign    Worried About Running Out of Food in the Last Year: Never true    Ran Out of Food in the Last Year: Never true  Transportation Needs: No Transportation Needs (02/18/2021)   PRAPARE - Hydrologist (Medical): No    Lack of Transportation (Non-Medical): No  Physical Activity: Sufficiently Active (02/18/2021)   Exercise Vital Sign    Days of Exercise per Week: 4 days    Minutes of Exercise per Session: 40 min  Stress: Stress Concern Present (09/17/2021)   [place]    Feeling of Stress : Rather much  Social Connections: Socially Isolated (02/18/2021)   Social Connection and Isolation Panel [NHANES]    Frequency of Communication with Friends and Family: More than three times a week    Frequency of Social Gatherings with Friends and Family: Three times a week    Attends Religious Services: Never    Active Member of Clubs or Organizations: No    Attends Archivist Meetings: Never    Marital Status: Divorced  Human resources officer Violence: Not At Risk (02/18/2021)   Humiliation, Afraid, Rape, and Kick questionnaire    Fear of Current or Ex-Partner: No    Emotionally Abused: No    Physically Abused: No    Sexually Abused: No      PHYSICAL EXAM  There were no vitals filed for this visit.   There is no height or weight on file to calculate BMI.  Generalized:  Well  developed, in no acute distress  Cardiology: normal rate and rhythm, no murmur noted Respiratory: clear to auscultation bilaterally  Neurological examination  Mentation: Alert oriented to time, place, history taking. Follows all commands speech and language fluent Cranial nerve II-XII: Pupils were equal round reactive to light. Extraocular movements were full, visual field were full  Motor: The motor testing reveals 5 over 5 strength of all 4 extremities. Good symmetric motor tone is noted throughout.  Gait and station: Gait is arthritic, walks with walker  DIAGNOSTIC DATA (LABS, IMAGING, TESTING) - I reviewed patient records, labs, notes, testing and imaging myself where available.      No data to display           Lab Results  Component Value Date   WBC 7.5 02/01/2021   HGB 14.1 02/01/2021   HCT 42.4 02/01/2021   MCV 98 (H) 02/01/2021   PLT 242 02/01/2021      Component Value Date/Time   NA 138 10/15/2021 0931   NA 139 08/28/2013 1805   K 4.0 10/15/2021 0931   K 3.5 08/28/2013 1805   CL 97 10/15/2021 0931   CL 101 08/28/2013 1805   CO2 26 10/15/2021 0931   CO2 34 (H) 08/28/2013 1805   GLUCOSE 95 10/15/2021 0931   GLUCOSE 129 (H) 11/20/2020 0511   GLUCOSE 91 08/28/2013 1805   BUN 16 10/15/2021 0931   BUN 13 08/28/2013 1805   CREATININE 1.06 (H) 10/15/2021 0931   CREATININE 1.27 08/28/2013 1805   CALCIUM 9.6 10/15/2021 0931   CALCIUM 8.8 08/28/2013 1805   PROT 7.1 10/15/2021 0931   PROT 7.8 08/28/2013 1805   ALBUMIN 4.9 10/15/2021 0931   ALBUMIN 4.2 08/28/2013 1805   AST 10 10/15/2021 0931   AST 23 08/28/2013 1805   ALT 8 10/15/2021 0931   ALT 16 08/28/2013 1805   ALKPHOS 74 10/15/2021 0931   ALKPHOS 64 08/28/2013 1805   BILITOT 0.3 10/15/2021 0931   BILITOT 0.3 08/28/2013 1805   GFRNONAA >60 11/20/2020 0511   GFRNONAA 47 (L) 08/28/2013 1805   GFRAA 83 06/28/2019 1312   GFRAA 54 (L) 08/28/2013 1805   Lab Results  Component Value Date   CHOL 239 (H)  02/01/2021   HDL 81 02/01/2021   LDLCALC 138 (H) 02/01/2021   TRIG 115 02/01/2021   CHOLHDL 2.1 06/12/2018   Lab Results  Component Value Date   HGBA1C 5.1 11/11/2019   Lab Results  Component Value Date   VITAMINB12 1,202 02/01/2021   Lab Results  Component Value Date   TSH 1.180 08/30/2020       ASSESSMENT AND PLAN 66 y.o. year old female  has a past medical history of Allergy, Anxiety, Arthritis, Chicken pox, COPD (chronic obstructive pulmonary disease) ([place]), Depression, Family history of adverse reaction to anesthesia, GERD (gastroesophageal reflux disease), Headache, History of blood transfusion, Hyperlipidemia, Hypertension, Migraine, PONV (postoperative nausea and vomiting), and Seizure ([place]). here with   No diagnosis found.    Is doing very well, today.  She has tolerated lamotrigine 100 mg twice daily.  We will continue current treatment plan.  She will continue to use rizatriptan as needed for migraine abortion. She will monitor for worsening tremor. Mood is stable.  She is now a non-smoker.  I have commended her on smoking cessation and continuation of healthy lifestyle habits.  She will continue close follow-up with primary care.  She will follow-up with me in 1 year, sooner if needed.  She  verbalizes understanding and agreement with this plan.   No orders of the defined types were placed in this encounter.     No orders of the defined types were placed in this encounter.     Debbora Presto, FNP-C 10/21/2021, 8:06 AM [facility] [address], [place], [place] [address] [phone]

## 2021-10-22 ENCOUNTER — Telehealth: Payer: Self-pay | Admitting: Family Medicine

## 2021-10-22 ENCOUNTER — Ambulatory Visit: Payer: Medicare Other | Admitting: Family Medicine

## 2021-10-22 DIAGNOSIS — G43009 Migraine without aura, not intractable, without status migrainosus: Secondary | ICD-10-CM

## 2021-10-22 DIAGNOSIS — R569 Unspecified convulsions: Secondary | ICD-10-CM

## 2021-10-22 NOTE — Progress Notes (Unsigned)
PATIENT: Kayla Shah DOB: 1955-05-12  REASON FOR VISIT: follow up HISTORY FROM: patient  No chief complaint on file.    HISTORY OF PRESENT ILLNESS:  10/22/21 ALL:  Tuesday returns for follow up for seizures and migraines. She continues lamotrigine '100mg'$  twice daily.   09/19/2020 ALL: Latise returns for follow up for seizures and migraines. She reports doing very well. She continues lamotrigine '100mg'$  BID and rizatriptan as needed. She rarely has headaches. She can not remember the last time she needed rizatriptan. She feels tremor is better off divalproex. Some action tremor, especially if anxious or tired. She denies seizure activity. She is having right hip pain. She is trying to find a surgeon who will consider hip replacement.   09/20/2019 ALL:  Kayla Shah is a 66 y.o. female here today for follow up for seizures and migraines. She reports doing well. She has tolerated lamotrigine well. She feels that tremor has significantly improved since discontinuing divalproex. She can write again. She can hold a coffee cup. She feels that migraines are well managed. She can't remember the last time she took rizatriptan. She is very happy with current treatment regimen. She is also very proud to report that she recently quit smoking. She is feeling better physically.   04/27/2019 ALL: Kayla Shah [place] is a 66 y.o. female here today for follow up for seizures. She continues divalproex '750mg'$  twice daily. She reports that tremors have significantly worsened. She is unable to hold a coffee cup. She has had significant weight loss as well. She does endorse depression and anxiety but has been followed closely by PCP and decline medications for this. She denies difficulty swallowing. No changes in gait. She denies changes in movements with exception of tremors. No family history of PD. Symptoms have progressively worsened since starting divalproex in May 2020. No seizure activity. She does not  drive. PCP started Megace last month. Headaches are well managed at this time.    09/06/2018 ALL:  Kayla Shah is a 66 y.o. female here today for follow up for seizure. She was admitted to [facility] on 06/12/2018 following two seizures at home. Depakote was subtherapeutic on admission. She denies missed doses. Dose was increased to '750mg'$  twice daily. She has noticed an increase in tremor since increasing dose but is tolerating well otherwise. She denies seizure activity. She is feeling well today and without complaints.    Upon review of ER notes, she had elevated troponin and abnormal EKG (inferior ST depression). Cardiology consulted and advised additional testing. She has extensive family history of cardiovascular disease. She is treated for HTN and told that lipids are elevated (not currently treated). She is smoking with no desire to quit.    HISTORY: (copied from Dr Gladstone Lighter note on 11/18/2017)   66 year old female here for evaluation of seizure and migraine.   Patient first developed seizures in 48s.  She was on Keppra at that time.  At some point she was taken off of Keppra.  2014 she was hospitalized for seizure.  She may have been on and off of Keppra following 2014 but she is not sure.  Patient reports grand mal seizures and postictal confusion symptoms but is not quite sure about the frequency.  She had another seizure possibly in September 2019.  She is not on antiseizure medication at this time.   Patient has history of migraine headaches at age 37 years old with pounding throbbing headaches, nausea, photophobia, typically unilateral right greater  than left-sided headaches.  She averages 4-5 headaches per month.  She is not sure what type of medication she has been on the past.   Patient also has history of depression, anxiety, stress, weight loss, chronic pain.     REVIEW OF SYSTEMS: Out of a complete 14 system review of symptoms, the patient complains only of the  following symptoms, hip pain and all other reviewed systems are negative.  ALLERGIES: Allergies  Allergen Reactions   Codeine Nausea And Vomiting   Other     No Anti Depressants - "makes me crazy"   Fluoxetine Anxiety   Paroxetine Anxiety    Becomes very aggressive with mood swings.    HOME MEDICATIONS: Outpatient Medications Prior to Visit  Medication Sig Dispense Refill   diazepam (VALIUM) 5 MG tablet Take 5 mg by mouth 3 (three) times daily as needed.     gabapentin (NEURONTIN) 100 MG capsule Take 1 capsule (100 mg total) by mouth 3 (three) times daily. 90 capsule 0   lamoTRIgine (LAMICTAL) 100 MG tablet TAKE 1 TABLET BY MOUTH TWICE A DAY 180 tablet 3   ondansetron (ZOFRAN) 4 MG tablet TAKE 1 TABLET BY MOUTH EVERY 8 HOURS AS NEEDED FOR NAUSEA OR VOMITING 20 tablet 0   oxyCODONE-acetaminophen (PERCOCET/ROXICET) 5-325 MG tablet TAKE 1 TABLET BY MOUTH EVERY 6 HOURS AS NEEDED FOR SEVERE PAIN FOR UP TO 3 DAYS     pantoprazole (PROTONIX) 40 MG tablet TAKE 1 TABLET BY MOUTH TWICE A DAY 180 tablet 1   rizatriptan (MAXALT-MLT) 10 MG disintegrating tablet Take 1 tablet (10 mg total) by mouth as needed for migraine. May repeat in 2 hours if needed 9 tablet 11   simvastatin (ZOCOR) 40 MG tablet Take 1 tablet (40 mg total) by mouth at bedtime. 90 tablet 3   [name] 40 MG tablet Take 1 tablet (40 mg total) by mouth daily. 90 tablet 3   verapamil (CALAN-SR) 240 MG CR tablet TAKE 1 TABLET BY MOUTH DAILY 90 tablet 1   vitamin B-12 (CYANOCOBALAMIN) 500 MCG tablet Take 500 mcg by mouth daily.     No facility-administered medications prior to visit.    PAST MEDICAL HISTORY: Past Medical History:  Diagnosis Date   Allergy    Anxiety    agoraphobia    Arthritis    Chicken pox    COPD (chronic obstructive pulmonary disease) ([place])    Depression    Family history of adverse reaction to anesthesia    PONV   GERD (gastroesophageal reflux disease)    Headache    History of blood  transfusion    1984   Hyperlipidemia    Hypertension    Migraine    PONV (postoperative nausea and vomiting)    Seizure ([place])    last seizure 04/2019    PAST SURGICAL HISTORY: Past Surgical History:  Procedure Laterality Date   APPENDECTOMY  1984   CATARACT EXTRACTION, BILATERAL Bilateral    COLONOSCOPY WITH PROPOFOL N/A 03/26/2017   Procedure: COLONOSCOPY WITH PROPOFOL;  Surgeon: Jonathon Bellows, MD;  Location: [facility] ENDOSCOPY;  Service: Gastroenterology;  Laterality: N/A;   TOTAL ABDOMINAL HYSTERECTOMY  1984   Total   TOTAL HIP ARTHROPLASTY Right 11/19/2020   Procedure: RIGHT TOTAL HIP ARTHROPLASTY ANTERIOR APPROACH;  Surgeon: Leandrew Koyanagi, MD;  Location: [facility];  Service: Orthopedics;  Laterality: Right;  C-3   VENTRAL HERNIA REPAIR N/A 09/16/2019   Procedure: HERNIA REPAIR VENTRAL ADULT, Open;  Surgeon: Fredirick Maudlin, MD;  Location:  [facility] ORS;  Service: General;  Laterality: N/A;   WISDOM TOOTH EXTRACTION      FAMILY HISTORY: Family History  Problem Relation Age of Onset   Stroke Mother    Depression Mother    Arthritis Mother    Heart disease Father    Alcohol abuse Father    Diabetes Father    Osteoporosis Sister    Kidney disease Sister    Arthritis Sister    Dementia Sister    Arthritis Maternal Grandmother    Heart disease Maternal Grandmother    Hyperlipidemia Maternal Grandmother    Diabetes Paternal Grandmother    Breast cancer Neg Hx     SOCIAL HISTORY: Social History   Socioeconomic History   Marital status: Divorced    Spouse name: Legrand Como   Number of children: 0   Years of education: 12   Highest education level: Not on file  Occupational History    Comment: disabled  Tobacco Use   Smoking status: Every Day    Packs/day: 1.00    Years: 45.00    Total pack years: 45.00    Types: Cigarettes   Smokeless tobacco: Never   Tobacco comments:    reports quitting in feb 2021  Vaping Use   Vaping Use: Never used  Substance and Sexual Activity    Alcohol use: No   Drug use: Yes    Types: Marijuana    Comment: every day   Sexual activity: Never  Other Topics Concern   Not on file  Social History Narrative   Disabled    Married no kids    No guns, wears seat belt, feels safe in relationship    12th grade ed.    Social Determinants of Health   Financial Resource Strain: Medium Risk (09/17/2021)   Overall Financial Resource Strain (CARDIA)    Difficulty of Paying Living Expenses: Somewhat hard  Food Insecurity: No Food Insecurity (09/17/2021)   Hunger Vital Sign    Worried About Running Out of Food in the Last Year: Never true    Ran Out of Food in the Last Year: Never true  Transportation Needs: No Transportation Needs (02/18/2021)   PRAPARE - Hydrologist (Medical): No    Lack of Transportation (Non-Medical): No  Physical Activity: Sufficiently Active (02/18/2021)   Exercise Vital Sign    Days of Exercise per Week: 4 days    Minutes of Exercise per Session: 40 min  Stress: Stress Concern Present (09/17/2021)   [place]    Feeling of Stress : Rather much  Social Connections: Socially Isolated (02/18/2021)   Social Connection and Isolation Panel [NHANES]    Frequency of Communication with Friends and Family: More than three times a week    Frequency of Social Gatherings with Friends and Family: Three times a week    Attends Religious Services: Never    Active Member of Clubs or Organizations: No    Attends Archivist Meetings: Never    Marital Status: Divorced  Human resources officer Violence: Not At Risk (02/18/2021)   Humiliation, Afraid, Rape, and Kick questionnaire    Fear of Current or Ex-Partner: No    Emotionally Abused: No    Physically Abused: No    Sexually Abused: No      PHYSICAL EXAM  There were no vitals filed for this visit.   There is no height or weight on file to calculate BMI.  Generalized:  Well  developed, in no acute distress  Cardiology: normal rate and rhythm, no murmur noted Respiratory: clear to auscultation bilaterally  Neurological examination  Mentation: Alert oriented to time, place, history taking. Follows all commands speech and language fluent Cranial nerve II-XII: Pupils were equal round reactive to light. Extraocular movements were full, visual field were full  Motor: The motor testing reveals 5 over 5 strength of all 4 extremities. Good symmetric motor tone is noted throughout.  Gait and station: Gait is arthritic, walks with walker  DIAGNOSTIC DATA (LABS, IMAGING, TESTING) - I reviewed patient records, labs, notes, testing and imaging myself where available.      No data to display           Lab Results  Component Value Date   WBC 7.5 02/01/2021   HGB 14.1 02/01/2021   HCT 42.4 02/01/2021   MCV 98 (H) 02/01/2021   PLT 242 02/01/2021      Component Value Date/Time   NA 138 10/15/2021 0931   NA 139 08/28/2013 1805   K 4.0 10/15/2021 0931   K 3.5 08/28/2013 1805   CL 97 10/15/2021 0931   CL 101 08/28/2013 1805   CO2 26 10/15/2021 0931   CO2 34 (H) 08/28/2013 1805   GLUCOSE 95 10/15/2021 0931   GLUCOSE 129 (H) 11/20/2020 0511   GLUCOSE 91 08/28/2013 1805   BUN 16 10/15/2021 0931   BUN 13 08/28/2013 1805   CREATININE 1.06 (H) 10/15/2021 0931   CREATININE 1.27 08/28/2013 1805   CALCIUM 9.6 10/15/2021 0931   CALCIUM 8.8 08/28/2013 1805   PROT 7.1 10/15/2021 0931   PROT 7.8 08/28/2013 1805   ALBUMIN 4.9 10/15/2021 0931   ALBUMIN 4.2 08/28/2013 1805   AST 10 10/15/2021 0931   AST 23 08/28/2013 1805   ALT 8 10/15/2021 0931   ALT 16 08/28/2013 1805   ALKPHOS 74 10/15/2021 0931   ALKPHOS 64 08/28/2013 1805   BILITOT 0.3 10/15/2021 0931   BILITOT 0.3 08/28/2013 1805   GFRNONAA >60 11/20/2020 0511   GFRNONAA 47 (L) 08/28/2013 1805   GFRAA 83 06/28/2019 1312   GFRAA 54 (L) 08/28/2013 1805   Lab Results  Component Value Date   CHOL 239 (H)  02/01/2021   HDL 81 02/01/2021   LDLCALC 138 (H) 02/01/2021   TRIG 115 02/01/2021   CHOLHDL 2.1 06/12/2018   Lab Results  Component Value Date   HGBA1C 5.1 11/11/2019   Lab Results  Component Value Date   VITAMINB12 1,202 02/01/2021   Lab Results  Component Value Date   TSH 1.180 08/30/2020       ASSESSMENT AND PLAN 66 y.o. year old female  has a past medical history of Allergy, Anxiety, Arthritis, Chicken pox, COPD (chronic obstructive pulmonary disease) ([place]), Depression, Family history of adverse reaction to anesthesia, GERD (gastroesophageal reflux disease), Headache, History of blood transfusion, Hyperlipidemia, Hypertension, Migraine, PONV (postoperative nausea and vomiting), and Seizure ([place]). here with   No diagnosis found.    Is doing very well, today.  She has tolerated lamotrigine 100 mg twice daily.  We will continue current treatment plan.  She will continue to use rizatriptan as needed for migraine abortion. She will monitor for worsening tremor. Mood is stable.  She is now a non-smoker.  I have commended her on smoking cessation and continuation of healthy lifestyle habits.  She will continue close follow-up with primary care.  She will follow-up with me in 1 year, sooner if needed.  She  verbalizes understanding and agreement with this plan.   No orders of the defined types were placed in this encounter.     No orders of the defined types were placed in this encounter.     Debbora Presto, FNP-C 10/22/2021, 9:16 AM [facility] [address] [place], [place] [address] [phone]

## 2021-10-22 NOTE — Telephone Encounter (Signed)
Pt cancelled appt due to transportation issues.

## 2021-10-22 NOTE — Patient Instructions (Signed)
Below is our plan:  We will continue lamotrigine '100mg'$  twice daily and rizatriptan as needed.   Please make sure you are consistent with timing of seizure medication. I recommend annual visit with primary care provider (PCP) for complete physical and routine blood work. I recommend daily intake of vitamin D (400-800iu) and calcium (800-'1000mg'$ ) for bone health. Discuss Dexa screening with PCP.   According to [place] law, you can not drive unless you are seizure / syncope free for at least 6 months and under physician's care.  Please maintain precautions. Do not participate in activities where a loss of awareness could harm you or someone else. No swimming alone, no tub bathing, no hot tubs, no driving, no operating motorized vehicles (cars, ATVs, motocycles, etc), lawnmowers, power tools or firearms. No standing at heights, such as rooftops, ladders or stairs. Avoid hot objects such as stoves, heaters, open fires. Wear a helmet when riding a bicycle, scooter, skateboard, etc. and avoid areas of traffic. Set your water heater to 120 degrees or less.  Please make sure you are staying well hydrated. I recommend 50-60 ounces daily. Well balanced diet and regular exercise encouraged. Consistent sleep schedule with 6-8 hours recommended.   Please continue follow up with care team as directed.   Follow up with me in 1 year   You may receive a survey regarding today's visit. I encourage you to leave honest feed back as I do use this information to improve patient care. Thank you for seeing me today!

## 2021-10-23 ENCOUNTER — Ambulatory Visit (INDEPENDENT_AMBULATORY_CARE_PROVIDER_SITE_OTHER): Payer: Medicare Other | Admitting: Family Medicine

## 2021-10-23 ENCOUNTER — Encounter: Payer: Self-pay | Admitting: Family Medicine

## 2021-10-23 VITALS — BP 147/74 | HR 57

## 2021-10-23 DIAGNOSIS — R569 Unspecified convulsions: Secondary | ICD-10-CM

## 2021-10-23 DIAGNOSIS — G43009 Migraine without aura, not intractable, without status migrainosus: Secondary | ICD-10-CM

## 2021-10-23 MED ORDER — RIZATRIPTAN BENZOATE 10 MG PO TBDP: 10.0000 mg | ORAL_TABLET | ORAL | 11 refills | Status: DC | PRN

## 2021-11-19 DIAGNOSIS — Z96641 Presence of right artificial hip joint: Secondary | ICD-10-CM | POA: Diagnosis not present

## 2021-11-19 DIAGNOSIS — M25561 Pain in right knee: Secondary | ICD-10-CM | POA: Diagnosis not present

## 2021-12-10 ENCOUNTER — Other Ambulatory Visit: Payer: Self-pay | Admitting: Family Medicine

## 2021-12-10 NOTE — Telephone Encounter (Signed)
Requested medication (s) are due for refill today - yes  Requested medication (s) are on the active medication list -yes  Future visit scheduled -yes  Last refill: 08/22/21 #20  Notes to clinic: non delegated Rx  Requested Prescriptions  Pending Prescriptions Disp Refills   ondansetron (ZOFRAN) 4 MG tablet [Pharmacy Med Name: ONDANSETRON HCL 4 MG TAB] 20 tablet 0    Sig: TAKE 1 TABLET BY MOUTH EVERY 8 HOURS AS NEEDED FOR NAUSEA OR VOMITING     Not Delegated - Gastroenterology: Antiemetics - ondansetron Failed - 12/10/2021  9:41 AM      Failed - This refill cannot be delegated      Passed - AST in normal range and within 360 days    AST  Date Value Ref Range Status  10/15/2021 10 0 - 40 IU/L Final   SGOT(AST)  Date Value Ref Range Status  08/28/2013 23 15 - 37 Unit/L Final         Passed - ALT in normal range and within 360 days    ALT  Date Value Ref Range Status  10/15/2021 8 0 - 32 IU/L Final   SGPT (ALT)  Date Value Ref Range Status  08/28/2013 16 U/L Final    Comment:    14-63 NOTE: New Reference Range 08/23/13          Passed - Valid encounter within last 6 months    Recent Outpatient Visits           1 month ago Essential hypertension   [facility] Kathrine Haddock, NP   3 months ago Pure hypercholesterolemia   [facility] Kathrine Haddock, NP   4 months ago Hot flashes due to surgical menopause   [facility] Mecum, Dani Gobble, PA-C   5 months ago Neck swelling   [facility] Vigg, Avanti, MD   10 months ago Aortic atherosclerosis ([place])   [place], Lauren A, NP       Future Appointments             In 1 month Kathrine Haddock, NP [place], [place]               Requested Prescriptions  Pending Prescriptions Disp Refills   ondansetron (ZOFRAN) 4 MG tablet [Pharmacy Med Name: ONDANSETRON HCL 4 MG TAB] 20 tablet 0    Sig: TAKE 1 TABLET BY MOUTH EVERY 8 HOURS AS  NEEDED FOR NAUSEA OR VOMITING     Not Delegated - Gastroenterology: Antiemetics - ondansetron Failed - 12/10/2021  9:41 AM      Failed - This refill cannot be delegated      Passed - AST in normal range and within 360 days    AST  Date Value Ref Range Status  10/15/2021 10 0 - 40 IU/L Final   SGOT(AST)  Date Value Ref Range Status  08/28/2013 23 15 - 37 Unit/L Final         Passed - ALT in normal range and within 360 days    ALT  Date Value Ref Range Status  10/15/2021 8 0 - 32 IU/L Final   SGPT (ALT)  Date Value Ref Range Status  08/28/2013 16 U/L Final    Comment:    14-63 NOTE: New Reference Range 08/23/13          Passed - Valid encounter within last 6 months    Recent Outpatient Visits  1 month ago Essential hypertension   [place] Kathrine Haddock, NP   3 months ago Pure hypercholesterolemia   [facility] Kathrine Haddock, NP   4 months ago Hot flashes due to surgical menopause   [facility] Mecum, Dani Gobble, PA-C   5 months ago Neck swelling   [place], MD   10 months ago Aortic atherosclerosis ([place])   [place], Scheryl Darter, NP       Future Appointments             In 1 month Kathrine Haddock, NP [facility], [place]

## 2021-12-20 ENCOUNTER — Other Ambulatory Visit: Payer: Self-pay

## 2021-12-20 DIAGNOSIS — I10 Essential (primary) hypertension: Secondary | ICD-10-CM

## 2021-12-20 MED ORDER — VERAPAMIL HCL ER 240 MG PO TBCR: 240.0000 mg | EXTENDED_RELEASE_TABLET | Freq: Every day | ORAL | 0 refills | Status: DC

## 2021-12-20 NOTE — Telephone Encounter (Signed)
30 day supply provided to get her to next apt in Dec with Malachy Mood

## 2022-01-08 ENCOUNTER — Telehealth: Payer: Self-pay | Admitting: Family Medicine

## 2022-01-08 NOTE — Telephone Encounter (Signed)
Almyra Free @ [facility]  is asking if [name],NP is ok with her increasing pt's lamoTRIgine (LAMICTAL) 100 MG tablet to 150 BID, for mood she states if [name], NP would like she can either call back or text her without pt's name to [phone]

## 2022-01-08 NOTE — Telephone Encounter (Signed)
LVM for Almyra Free letting her know AL,NP ok w/ her increasing lamictal dose. Advised seizure/migraine stable. Asked her to call back if any further questions.

## 2022-01-14 ENCOUNTER — Ambulatory Visit: Payer: Medicare Other | Admitting: Unknown Physician Specialty

## 2022-01-20 ENCOUNTER — Other Ambulatory Visit: Payer: Self-pay | Admitting: Physician Assistant

## 2022-01-20 DIAGNOSIS — I10 Essential (primary) hypertension: Secondary | ICD-10-CM

## 2022-01-21 NOTE — Telephone Encounter (Signed)
Requested Prescriptions  Pending Prescriptions Disp Refills   verapamil (CALAN-SR) 240 MG CR tablet [Pharmacy Med Name: VERAPAMIL HCL ER 240 MG TAB] 90 tablet 0    Sig: TAKE 1 TABLET BY MOUTH DAILY     Cardiovascular: Calcium Channel Blockers 3 Failed - 01/20/2022  1:17 PM      Failed - Cr in normal range and within 360 days    Creatinine  Date Value Ref Range Status  08/28/2013 1.27 0.60 - 1.30 mg/dL Final   Creatinine, Ser  Date Value Ref Range Status  10/15/2021 1.06 (H) 0.57 - 1.00 mg/dL Final         Failed - Last BP in normal range    BP Readings from Last 1 Encounters:  10/23/21 (!) 147/74         Passed - ALT in normal range and within 360 days    ALT  Date Value Ref Range Status  10/15/2021 8 0 - 32 IU/L Final   SGPT (ALT)  Date Value Ref Range Status  08/28/2013 16 U/L Final    Comment:    14-63 NOTE: New Reference Range 08/23/13          Passed - AST in normal range and within 360 days    AST  Date Value Ref Range Status  10/15/2021 10 0 - 40 IU/L Final   SGOT(AST)  Date Value Ref Range Status  08/28/2013 23 15 - 37 Unit/L Final         Passed - Last Heart Rate in normal range    Pulse Readings from Last 1 Encounters:  10/23/21 (!) 35         Passed - Valid encounter within last 6 months    Recent Outpatient Visits           3 months ago Essential hypertension   [place] Kathrine Haddock, NP   4 months ago Pure hypercholesterolemia   [facility] Kathrine Haddock, NP   5 months ago Hot flashes due to surgical menopause   [facility] Mecum, Dani Gobble, PA-C   6 months ago Neck swelling   [place], MD   11 months ago Aortic atherosclerosis ([place])   [place], Scheryl Darter, NP

## 2022-03-12 ENCOUNTER — Other Ambulatory Visit: Payer: Self-pay | Admitting: Nurse Practitioner

## 2022-03-12 DIAGNOSIS — K219 Gastro-esophageal reflux disease without esophagitis: Secondary | ICD-10-CM

## 2022-03-12 MED ORDER — PANTOPRAZOLE SODIUM 40 MG PO TBEC: 40.0000 mg | DELAYED_RELEASE_TABLET | Freq: Two times a day (BID) | ORAL | 0 refills | Status: DC

## 2022-03-12 NOTE — Telephone Encounter (Signed)
Pharmacy request new meds PANTOPRAZOLE SODIUM 40 MG ,

## 2022-03-12 NOTE — Telephone Encounter (Signed)
Medication refill for pantoprazole 40 mg last ov 10/15/21, upcoming ov no up coming visit . Please advise

## 2022-03-20 ENCOUNTER — Telehealth: Payer: Self-pay | Admitting: Nurse Practitioner

## 2022-03-20 NOTE — Telephone Encounter (Signed)
Copied from [facility]. Topic: Medicare AWV >> Mar 20, 2022  1:51 PM Devoria Glassing wrote: Reason for CRM: Left message for patient to schedule Annual Wellness Visit.  Please schedule with Health Nurse Advisor Kirke Shaggy at [facility].Call [place] at [phone]

## 2022-04-18 ENCOUNTER — Telehealth: Payer: Self-pay | Admitting: Nurse Practitioner

## 2022-04-18 NOTE — Telephone Encounter (Signed)
[place] to schedule their annual wellness visit. Call back at later date: 05/12/2022  Kayla Shah; [place] Direct Dial: [phone]

## 2022-04-21 ENCOUNTER — Other Ambulatory Visit: Payer: Self-pay | Admitting: Nurse Practitioner

## 2022-04-21 DIAGNOSIS — K219 Gastro-esophageal reflux disease without esophagitis: Secondary | ICD-10-CM

## 2022-04-22 ENCOUNTER — Ambulatory Visit (INDEPENDENT_AMBULATORY_CARE_PROVIDER_SITE_OTHER): Payer: 59 | Admitting: Unknown Physician Specialty

## 2022-04-22 ENCOUNTER — Encounter: Payer: Self-pay | Admitting: Unknown Physician Specialty

## 2022-04-22 VITALS — BP 124/81 | HR 71 | Temp 97.9°F | Ht 65.35 in | Wt 126.4 lb

## 2022-04-22 DIAGNOSIS — Z636 Dependent relative needing care at home: Secondary | ICD-10-CM

## 2022-04-22 DIAGNOSIS — I10 Essential (primary) hypertension: Secondary | ICD-10-CM | POA: Diagnosis not present

## 2022-04-22 DIAGNOSIS — I2510 Atherosclerotic heart disease of native coronary artery without angina pectoris: Secondary | ICD-10-CM | POA: Diagnosis not present

## 2022-04-22 DIAGNOSIS — I700 Atherosclerosis of aorta: Secondary | ICD-10-CM

## 2022-04-22 DIAGNOSIS — E538 Deficiency of other specified B group vitamins: Secondary | ICD-10-CM

## 2022-04-22 DIAGNOSIS — K219 Gastro-esophageal reflux disease without esophagitis: Secondary | ICD-10-CM | POA: Diagnosis not present

## 2022-04-22 DIAGNOSIS — Z79899 Other long term (current) drug therapy: Secondary | ICD-10-CM

## 2022-04-22 DIAGNOSIS — R569 Unspecified convulsions: Secondary | ICD-10-CM

## 2022-04-22 MED ORDER — PANTOPRAZOLE SODIUM 40 MG PO TBEC: 40.0000 mg | DELAYED_RELEASE_TABLET | Freq: Every day | ORAL | 3 refills | Status: DC

## 2022-04-22 NOTE — Assessment & Plan Note (Signed)
Pt has valium for caregiver stress.  Rarely takes it.  Discussed Ashwaghanda to take prn

## 2022-04-22 NOTE — Assessment & Plan Note (Signed)
Stable, continue present medications.   

## 2022-04-22 NOTE — Assessment & Plan Note (Signed)
Taper Protonix very slowly.  Take 1/2 tab instead of a whole tab once a week for 2 week.  Then 1/2 tab instead of a whole tab twice a week for 2 weeks and continue until taking 1/2 tab every day.  Remember wait 2 weeks with each change  Once on 1/2 tab daily, skip one day a week and take Pepsid instead.  After 2 weeks can continue taper down with Pepsid instead of 1/2 tablet an extra day a week until taking one Pepsid a day.  Then taper the Pepsid by skipping one day a week.

## 2022-04-22 NOTE — Assessment & Plan Note (Signed)
Taking Lamictal.  No seizures and also controlling migraines.

## 2022-04-22 NOTE — Assessment & Plan Note (Signed)
Taking a B12 supplement

## 2022-04-22 NOTE — Assessment & Plan Note (Signed)
No symptoms of chest pain or SOB.  Check lipid panel

## 2022-04-22 NOTE — Progress Notes (Signed)
BP 124/81   Pulse 71   Temp 97.9 F (36.6 C) (Oral)   Ht 5' 5.35" (1.66 m)   Wt 126 lb 6.4 oz (57.3 kg)   BMI 20.81 kg/m    Subjective:    Patient ID: Kayla Shah, female    DOB: [birth year], 67 y.o.   MRN: [id number]  HPI: Kayla Shah is a 67 y.o. female  Chief Complaint  Patient presents with   Medication Management      04/22/2022    8:18 AM 10/15/2021    9:17 AM 09/10/2021    8:24 AM 08/09/2021   10:13 AM 07/02/2021    1:59 PM  Depression screen PHQ 2/9  Decreased Interest 0 1 0 0 0  Down, Depressed, Hopeless 0 0 0 0 0  PHQ - 2 Score 0 1 0 0 0  Altered sleeping 0 0 0 0 0  Tired, decreased energy 0 0 0 0 0  Change in appetite 0 0 0 0 0  Feeling bad or failure about yourself  0 0 0 0 0  Trouble concentrating 0 0 0 0 0  Moving slowly or fidgety/restless 0 0 0 0 0  Suicidal thoughts 0 0 0 0 0  PHQ-9 Score 0 1 0 0 0  Difficult doing work/chores Not difficult at all Not difficult at all Not difficult at all Not difficult at all Not difficult at all   Caregiver stress: She states it fluctuates depending on multiple factors.  She is caring for her sister with Alzheimer's.    Reflux: Pt brought in a bottle of Protonix.  She is taking this once a day rather than twice a day.  She wonders what it is for and if she needs it.    Relevant past medical, surgical, family and social history reviewed and updated as indicated. Interim medical history since our last visit reviewed. Allergies and medications reviewed and updated.  Review of Systems  Per HPI unless specifically indicated above     Objective:    BP 124/81   Pulse 71   Temp 97.9 F (36.6 C) (Oral)   Ht 5' 5.35" (1.66 m)   Wt 126 lb 6.4 oz (57.3 kg)   BMI 20.81 kg/m   Wt Readings from Last 3 Encounters:  04/22/22 126 lb 6.4 oz (57.3 kg)  10/15/21 127 lb 14.4 oz (58 kg)  09/10/21 132 lb (59.9 kg)    Physical Exam Constitutional:      Appearance: She is well-developed.  HENT:     Head:  Normocephalic and atraumatic.  Eyes:     General: No scleral icterus.       Right eye: No discharge.        Left eye: No discharge.     Pupils: Pupils are equal, round, and reactive to light.  Neck:     Thyroid: No thyromegaly.     Vascular: No carotid bruit.  Cardiovascular:     Rate and Rhythm: Normal rate and regular rhythm.     Heart sounds: Normal heart sounds. No murmur heard.    No friction rub. No gallop.  Pulmonary:     Effort: Pulmonary effort is normal. No respiratory distress.     Breath sounds: Normal breath sounds. No wheezing or rales.  Abdominal:     General: Bowel sounds are normal.     Palpations: Abdomen is soft.     Tenderness: There is no abdominal tenderness. There is no rebound.  Musculoskeletal:  General: Normal range of motion.     Cervical back: Normal range of motion and neck supple.  Lymphadenopathy:     Cervical: No cervical adenopathy.  Skin:    General: Skin is warm and dry.     Findings: No rash.  Neurological:     Mental Status: She is alert and oriented to person, place, and time.  Psychiatric:        Speech: Speech normal.        Behavior: Behavior normal.        Thought Content: Thought content normal.        Judgment: Judgment normal.     Results for orders placed or performed in visit on 10/15/21  Comprehensive metabolic panel  Result Value Ref Range   Glucose 95 70 - 99 mg/dL   BUN 16 8 - 27 mg/dL   Creatinine, Ser 1.06 (H) 0.57 - 1.00 mg/dL   eGFR 58 (L) >59 mL/min/1.73   BUN/Creatinine Ratio 15 12 - 28   Sodium 138 134 - 144 mmol/L   Potassium 4.0 3.5 - 5.2 mmol/L   Chloride 97 96 - 106 mmol/L   CO2 26 20 - 29 mmol/L   Calcium 9.6 8.7 - 10.3 mg/dL   Total Protein 7.1 6.0 - 8.5 g/dL   Albumin 4.9 3.9 - 4.9 g/dL   Globulin, Total 2.2 1.5 - 4.5 g/dL   Albumin/Globulin Ratio 2.2 1.2 - 2.2   Bilirubin Total 0.3 0.0 - 1.2 mg/dL   Alkaline Phosphatase 74 44 - 121 IU/L   AST 10 0 - 40 IU/L   ALT 8 0 - 32 IU/L       Assessment & Plan:   Problem List Items Addressed This Visit       Unprioritized   Aortic atherosclerosis (HCC)   Relevant Orders   Lipid Panel w/o Chol/HDL Ratio   CAD (coronary artery disease)    No symptoms of chest pain or SOB.  Check lipid panel      Caregiver stress    Pt has valium for caregiver stress.  Rarely takes it.  Discussed Ashwaghanda to take prn      Essential hypertension    Stable, continue present medications.        Relevant Orders   Comp Met (CMET)   Gastroesophageal reflux disease without esophagitis - Primary    Taper Protonix very slowly.  Take 1/2 tab instead of a whole tab once a week for 2 week.  Then 1/2 tab instead of a whole tab twice a week for 2 weeks and continue until taking 1/2 tab every day.  Remember wait 2 weeks with each change  Once on 1/2 tab daily, skip one day a week and take Pepsid instead.  After 2 weeks can continue taper down with Pepsid instead of 1/2 tablet an extra day a week until taking one Pepsid a day.  Then taper the Pepsid by skipping one day a week.      Relevant Medications   pantoprazole (PROTONIX) 40 MG tablet   Seizures (HCC)    Taking Lamictal.  No seizures and also controlling migraines.        Vitamin B12 deficiency    Taking a B12 supplement      Relevant Orders   Vitamin B12   Other Visit Diagnoses     Medication management       Relevant Orders   CBC w/Diff        Follow up plan: No follow-ups  on file.

## 2022-04-22 NOTE — Patient Instructions (Addendum)
Taper Protonix very slowly.  Take 1/2 tab instead of a whole tab once a week for 2 week.  Then 1/2 tab instead of a whole tab twice a week for 2 weeks and continue until taking 1/2 tab every day.  Remember wait 2 weeks with each change  Once on 1/2 tab daily, skip one day a week and take Pepsid instead.  After 2 weeks can continue taper down with Pepsid instead of 1/2 tablet an extra day a week until taking one Pepsid a day.  Then taper the Pepsid by skipping one day a week.   Can take Ashwaghanda for stress or anxiety

## 2022-05-01 ENCOUNTER — Other Ambulatory Visit: Payer: Self-pay | Admitting: Nurse Practitioner

## 2022-05-01 DIAGNOSIS — I10 Essential (primary) hypertension: Secondary | ICD-10-CM

## 2022-05-02 NOTE — Telephone Encounter (Signed)
Requested Prescriptions  Pending Prescriptions Disp Refills   verapamil (CALAN-SR) 240 MG CR tablet [Pharmacy Med Name: VERAPAMIL HCL ER 240 MG TAB] 90 tablet 0    Sig: TAKE 1 TABLET BY MOUTH DAILY     Cardiovascular: Calcium Channel Blockers 3 Failed - 05/01/2022  4:26 PM      Failed - Cr in normal range and within 360 days    Creatinine  Date Value Ref Range Status  08/28/2013 1.27 0.60 - 1.30 mg/dL Final   Creatinine, Ser  Date Value Ref Range Status  10/15/2021 1.06 (H) 0.57 - 1.00 mg/dL Final         Passed - ALT in normal range and within 360 days    ALT  Date Value Ref Range Status  10/15/2021 8 0 - 32 IU/L Final   SGPT (ALT)  Date Value Ref Range Status  08/28/2013 16 U/L Final    Comment:    14-63 NOTE: New Reference Range 08/23/13          Passed - AST in normal range and within 360 days    AST  Date Value Ref Range Status  10/15/2021 10 0 - 40 IU/L Final   SGOT(AST)  Date Value Ref Range Status  08/28/2013 23 15 - 37 Unit/L Final         Passed - Last BP in normal range    BP Readings from Last 1 Encounters:  04/22/22 124/81         Passed - Last Heart Rate in normal range    Pulse Readings from Last 1 Encounters:  04/22/22 71         Passed - Valid encounter within last 6 months    Recent Outpatient Visits           1 week ago Gastroesophageal reflux disease without esophagitis   [facility] Kathrine Haddock, NP   6 months ago Essential hypertension   [place] Kathrine Haddock, NP   7 months ago Pure hypercholesterolemia   [place] [facility] Kathrine Haddock, NP   8 months ago Hot flashes due to surgical menopause   [facility] Mecum, Dani Gobble, PA-C   10 months ago Neck swelling   [facility] Charlynne Cousins, MD

## 2022-05-19 ENCOUNTER — Telehealth: Payer: Self-pay | Admitting: Nurse Practitioner

## 2022-05-19 NOTE — Telephone Encounter (Signed)
Copied from CRM [id number]. Topic: Medicare AWV >> May 19, 2022  2:09 PM Payton Doughty wrote: Reason for CRM: Called patient to schedule Medicare Annual Wellness Visit (AWV). Left message for patient to call back and schedule Medicare Annual Wellness Visit (AWV).  Last date of AWV: 02/18/21  Please schedule an appointment at any time with Kennedy Bucker, LPN  .  If any questions, please contact me.  Thank you ,  Verlee Rossetti; Care Guide Ambulatory Clinical Support [place] [facility] Direct Dial: [phone]

## 2022-06-05 ENCOUNTER — Telehealth: Payer: Self-pay | Admitting: Nurse Practitioner

## 2022-06-05 NOTE — Telephone Encounter (Signed)
Copied from CRM [id number]. Topic: Medicare AWV >> Jun 05, 2022 11:25 AM Payton Doughty wrote: Reason for CRM: Called patient to schedule Medicare Annual Wellness Visit (AWV). Left message for patient to call back and schedule Medicare Annual Wellness Visit (AWV).  Last date of AWV: 02/18/21  Please schedule an appointment at any time with Kennedy Bucker, LPN  .  If any questions, please contact me.  Thank you ,  Verlee Rossetti; Care Guide Ambulatory Clinical Support [place] [facility] Direct Dial: [phone]

## 2022-06-16 ENCOUNTER — Telehealth: Payer: Self-pay | Admitting: Nurse Practitioner

## 2022-06-16 NOTE — Telephone Encounter (Signed)
Called patient but she immediately informed me that she was with her sister and believed that she was in heart failure. Patient stated that she would call back at a later time.  When patient calls back please let her know that she received a bill for $129.10 because her insurance requires that she pays an annual deductible. Also inform patient that because she was seen by Gabriel Cirri in September and a 6 month follow up appointment is required for patients with HTN that the scheduler scheduled a follow up appointment with Gabriel Cirri. Please let the patient know that her PCP is still Jolene Cannady.

## 2022-06-16 NOTE — Telephone Encounter (Signed)
Copied from CRM [id number]. Topic: Complaint - Billing/Coding >> Jun 16, 2022  9:12 AM Marlow Baars wrote: DOS: 04/22/22 Details of complaint: Patient received bill of $129.10 How would the patient like to see this issue resolved? Patient would like to talk with someone about her bill and why she had her last appt with Gabriel Cirri. Her insurance [employer] needs to know why she had to been seen again within 6 months to clarify this. Please assist patient further   Route to Research officer, political party.

## 2022-06-18 ENCOUNTER — Telehealth: Payer: Self-pay | Admitting: Nurse Practitioner

## 2022-06-18 NOTE — Telephone Encounter (Signed)
Copied from CRM [id number]. Topic: Medicare AWV >> Jun 18, 2022  1:57 PM Payton Doughty wrote: Reason for CRM: Called patient to schedule Medicare Annual Wellness Visit (AWV). Left message for patient to call back and schedule Medicare Annual Wellness Visit (AWV).  Last date of AWV: 02/18/21  Please schedule an appointment at any time with Kennedy Bucker, LPN  .  If any questions, please contact me.  Thank you ,  Verlee Rossetti; Care Guide Ambulatory Clinical Support [place] [facility] Direct Dial: [phone]

## 2022-06-27 ENCOUNTER — Other Ambulatory Visit: Payer: Self-pay

## 2022-06-27 ENCOUNTER — Emergency Department
Admission: EM | Admit: 2022-06-27 | Discharge: 2022-06-27 | Disposition: A | Discharge status: Home / Self Care | Payer: 59 | Attending: Student in an Organized Health Care Education/Training Program | Admitting: Student in an Organized Health Care Education/Training Program

## 2022-06-27 ENCOUNTER — Encounter: Payer: Self-pay | Admitting: Emergency Medicine

## 2022-06-27 ENCOUNTER — Emergency Department: Payer: 59

## 2022-06-27 DIAGNOSIS — I509 Heart failure, unspecified: Secondary | ICD-10-CM | POA: Diagnosis not present

## 2022-06-27 DIAGNOSIS — I2510 Atherosclerotic heart disease of native coronary artery without angina pectoris: Secondary | ICD-10-CM | POA: Insufficient documentation

## 2022-06-27 DIAGNOSIS — M5136 Other intervertebral disc degeneration, lumbar region: Secondary | ICD-10-CM

## 2022-06-27 DIAGNOSIS — M47816 Spondylosis without myelopathy or radiculopathy, lumbar region: Secondary | ICD-10-CM | POA: Diagnosis not present

## 2022-06-27 DIAGNOSIS — I110 Hypertensive heart disease with heart failure: Secondary | ICD-10-CM | POA: Diagnosis not present

## 2022-06-27 DIAGNOSIS — M47896 Other spondylosis, lumbar region: Secondary | ICD-10-CM | POA: Diagnosis not present

## 2022-06-27 DIAGNOSIS — M4316 Spondylolisthesis, lumbar region: Secondary | ICD-10-CM | POA: Diagnosis not present

## 2022-06-27 DIAGNOSIS — J449 Chronic obstructive pulmonary disease, unspecified: Secondary | ICD-10-CM | POA: Diagnosis not present

## 2022-06-27 DIAGNOSIS — M545 Low back pain, unspecified: Secondary | ICD-10-CM | POA: Diagnosis not present

## 2022-06-27 MED ORDER — PREDNISONE 20 MG PO TABS
60.0000 mg | ORAL_TABLET | Freq: Once | ORAL | Status: AC
  Administered 2022-06-27: 60 mg via ORAL
  Filled 2022-06-27: qty 3

## 2022-06-27 MED ORDER — OXYCODONE HCL 5 MG PO TABS: 5.0000 mg | ORAL_TABLET | Freq: Three times a day (TID) | ORAL | 0 refills | Status: DC | PRN

## 2022-06-27 MED ORDER — PREDNISONE 10 MG (21) PO TBPK: ORAL_TABLET | ORAL | 0 refills | Status: DC

## 2022-06-27 MED ORDER — OXYCODONE HCL 5 MG PO TABS
5.0000 mg | ORAL_TABLET | Freq: Once | ORAL | Status: AC
  Administered 2022-06-27: 5 mg via ORAL
  Filled 2022-06-27: qty 1

## 2022-06-27 NOTE — ED Triage Notes (Signed)
Pt to ED for lower back pain for 3-4 months. Reports pain radiates to right knee.

## 2022-06-27 NOTE — Discharge Instructions (Signed)
Please call and schedule an appointment with orthopedics.  Start the prednisone pack tomorrow since you had a dose here.  The pain medication may make you sleepy, drowsy, or dizzy.

## 2022-06-27 NOTE — ED Provider Notes (Signed)
[facility] Provider Note    Event Date/Time   First MD Initiated Contact with Patient 06/27/22 [phone]     (approximate)   History   Back Pain   HPI  Kayla Shah is a 67 y.o. female with history of COPD, seizure, B12 deficiency, heart failure, CAD, hypertension and as listed in EMR presents to the emergency department for treatment and evaluation of right side lower back pain that radiates into the right hip and right leg.  No specific injury.  Similar pain prior to hip replacement.  No relief with ice, heat, Tylenol, rest..      Physical Exam   Triage Vital Signs: ED Triage Vitals [06/27/22 0927]  Enc Vitals Group     BP (!) 120/97     Pulse Rate 80     Resp 18     Temp 98 F (36.7 C)     Temp src      SpO2 100 %     Weight 125 lb (56.7 kg)     Height 5\' 5"  (1.651 m)     Head Circumference      Peak Flow      Pain Score 10     Pain Loc      Pain Edu?      Excl. in GC?     Most recent vital signs: Vitals:   06/27/22 0927 06/27/22 1143  BP: (!) 120/97 122/88  Pulse: 80 78  Resp: 18 18  Temp: 98 F (36.7 C)   SpO2: 100% 100%    General: Awake, no distress.  CV:  Good peripheral perfusion.  Resp:  Normal effort.  Abd:  No distention.  Other:  Focal area of tenderness to the right side lumbar area   ED Results / Procedures / Treatments   Labs (all labs ordered are listed, but only abnormal results are displayed) Labs Reviewed - No data to display   EKG  Not indicated.   RADIOLOGY  Image and radiology report reviewed and interpreted by me. Radiology report consistent with the same.  Image of the lumbar spine shows a lower lumbar spine spondylosis and degenerative disc disease with grade 1 degenerative anterior listhesis at L4-L5.  Stable, mild superior endplate concavity at L2.  PROCEDURES:  Critical Care performed: No  Procedures   MEDICATIONS ORDERED IN ED:  Medications  oxyCODONE (Oxy IR/ROXICODONE)  immediate release tablet 5 mg (5 mg Oral Given 06/27/22 1019)  predniSONE (DELTASONE) tablet 60 mg (60 mg Oral Given 06/27/22 1019)     IMPRESSION / MDM / ASSESSMENT AND PLAN / ED COURSE   I have reviewed the triage note.  Differential diagnosis includes, but is not limited to, sciatica, degenerative disc disease, osteoporosis, musculoskeletal strain  Patient's presentation is most consistent with acute complicated illness / injury requiring diagnostic workup.  67 year old female presenting to the emergency department for treatment and evaluation of 3 to 4 months of persistent right side lower back pain that radiates down her right buttock, posterior thigh, and knee.  No relief with over-the-counter treatments.  No specific injury.  Some improvement of pain after prednisone and oxycodone IR.  Imaging shows significant degenerative disc disease, and grade 1 anterior listhesis at L4-5.  Results discussed with the patient.  Plan will be to treat her with a tapered prednisone pack and pain medication.  If symptoms do not resolve, she is to follow-up with orthopedics.  For any symptom that changes or worsens she is to  return to the emergency department if she is unable to see the orthopedist or primary care.      FINAL CLINICAL IMPRESSION(S) / ED DIAGNOSES   Final diagnoses:  Lumbar spondylosis  Lumbar degenerative disc disease  Anterolisthesis of lumbar spine     Rx / DC Orders   ED Discharge Orders          Ordered    predniSONE (STERAPRED UNI-PAK 21 TAB) 10 MG (21) TBPK tablet        06/27/22 1134    oxyCODONE (ROXICODONE) 5 MG immediate release tablet  Every 8 hours PRN        06/27/22 1134             Note:  This document was prepared using Dragon voice recognition software and may include unintentional dictation errors.   Chinita Pester, FNP 06/27/22 1439    Willy Eddy, MD 06/27/22 1539

## 2022-07-01 ENCOUNTER — Ambulatory Visit: Payer: 59 | Admitting: Physical Medicine and Rehabilitation

## 2022-07-01 ENCOUNTER — Encounter: Payer: Self-pay | Admitting: Physical Medicine and Rehabilitation

## 2022-07-01 ENCOUNTER — Ambulatory Visit (INDEPENDENT_AMBULATORY_CARE_PROVIDER_SITE_OTHER): Payer: 59 | Admitting: Physical Medicine and Rehabilitation

## 2022-07-01 DIAGNOSIS — M5416 Radiculopathy, lumbar region: Secondary | ICD-10-CM | POA: Diagnosis not present

## 2022-07-01 DIAGNOSIS — G894 Chronic pain syndrome: Secondary | ICD-10-CM | POA: Diagnosis not present

## 2022-07-01 DIAGNOSIS — G8929 Other chronic pain: Secondary | ICD-10-CM

## 2022-07-01 DIAGNOSIS — M5441 Lumbago with sciatica, right side: Secondary | ICD-10-CM | POA: Diagnosis not present

## 2022-07-01 DIAGNOSIS — M48062 Spinal stenosis, lumbar region with neurogenic claudication: Secondary | ICD-10-CM | POA: Diagnosis not present

## 2022-07-01 MED ORDER — HYDROCODONE-ACETAMINOPHEN 5-325 MG PO TABS: 1.0000 | ORAL_TABLET | Freq: Three times a day (TID) | ORAL | 0 refills | Status: AC | PRN

## 2022-07-01 NOTE — Progress Notes (Unsigned)
Functional Pain Scale - descriptive words and definitions  Distressing (6)    Pain is present/unable to complete most ADLs limited by pain/sleep is difficult and active distraction is only marginal. Moderate range order  Average Pain  varies  Lower back pain that can be on both sides that radiates into the right leg to the knee, but can go further

## 2022-07-01 NOTE — Progress Notes (Unsigned)
Kayla Shah [place] - 67 y.o. female MRN [id number]  Date of birth: 1955-06-06  Office Visit Note: Visit Date: 07/01/2022 PCP: Gabriel Cirri, NP Referred by: Gabriel Cirri, NP  Subjective: Chief Complaint  Patient presents with   Lower Back - Pain   HPI: Darean Trabue is a 67 y.o. female who comes in today as a self referral for evaluation of chronic, worsening and severe bilateral lower back pain radiating down right anterior thigh to knee. Pain ongoing for several years. She describes pain as sore, stabbing and burning sensation, currently rates as 6 out of 10. Some relief of pain with home exercise regimen, rest and use of medications. History of formal physical therapy with [facility] in the past with minimal relief of pain. Lumbar MRI imaging from 2022 exhibits multi level facet degeneration and moderate spinal canal stenosis at L4-L5. Patient was recently evaluated for severe exacerbation of her pain in the emergency room on 06/27/2022. Recent lumbar x-rays show grade 1 anterolisthesis at L4-L5. She was prescribed oral Prednisone and Oxycodone at discharge. Feels Oxycodone helps to alleviate her pain significantly. History of multiple lumbar epidural steroid injections by Dr. Redge Gainer at [facility] in [place], most recent was bilateral L4 transforaminal epidural steroid injection in 2022. Patient reports injections increased pain, no real long term relief of discomfort. Dr. Sabino Gasser recommended referral to chronic pain management, however she declined at that time. States she is concerned about chronic pain management as she does use marijuana.Previous surgical consult with Dr. Nathaniel Man with [facility] in 2022, per his notes he did not recommend surgery at that time. Patient states she is under severe stress as she is helping to care for her sister. Patient currently using cane to assist with ambulation. Patient denies focal weakness, numbness and tingling.  No recent trauma or falls.         Oswestry Disability Index Score 58% 20 to 30 (60%) severe disability: Pain remains the main problem in this group but activities of daily living are affected. These patients require a detailed investigation.  Review of Systems  Musculoskeletal:  Positive for back pain.  Neurological:  Negative for tingling, sensory change, focal weakness and weakness.  All other systems reviewed and are negative.  Otherwise per HPI.  Assessment & Plan: Visit Diagnoses:    ICD-10-CM   1. Chronic bilateral low back pain with right-sided sciatica  M54.41    G89.29     2. Lumbar radiculopathy  M54.16     3. Spinal stenosis of lumbar region with neurogenic claudication  M48.062     4. Chronic pain syndrome  G89.4        Plan: Findings:  Chronic, worsening and severe bilateral lower back pain radiating down right anterior thigh to knee. Patient continues to have severe pain despite good conservative therapies such as formal physical therapy, home exercise regimen, rest and use of medications. Patients clinical presentation and exam are consistent with neurogenic claudication as a result of spinal canal stenosis. There is moderate spinal canal stenosis at the level of L4-L5. I also feel there could be a type of central sensitization syndrome such as fibromyalgia. Bilateral lumbar and thoracic paraspinal regions tender upon palpation today. We discussed treatment plan in detail today. We would recommend right L4 transforaminal epidural steroid injection, however patient does not wish to undergo injections at this time. I also discussed re-grouping with physical therapy. Patient would like to continue with chronic pain management at this time, recommend  she follow up with PCP for referral. I prescribed short course of Norco until she is able to be seen by pain management. We are happy to see patient back if she would like to move forward with injections. She is understanding of  plan, has no questions at this time. No red flag symptoms noted upon exam today.      Meds & Orders:  Meds ordered this encounter  Medications   HYDROcodone-acetaminophen (NORCO/VICODIN) 5-325 MG tablet    Sig: Take 1 tablet by mouth every 8 (eight) hours as needed for up to 5 days for moderate pain or severe pain.    Dispense:  15 tablet    Refill:  0   No orders of the defined types were placed in this encounter.   Follow-up: Return if symptoms worsen or fail to improve.   Procedures: No procedures performed      Clinical History: EXAM: MRI LUMBAR SPINE WITHOUT CONTRAST   TECHNIQUE: Multiplanar, multisequence MR imaging of the lumbar spine was performed. No intravenous contrast was administered.   COMPARISON:  CT abdomen and pelvis 05/01/2019   FINDINGS: Segmentation:  Standard.   Alignment:  Trace facet mediated anterolisthesis of L4 on L5.   Vertebrae: Mild chronic T12 and L2 superior endplate compression fractures, unchanged. No acute fracture or suspicious marrow lesion. Mild marrow edema about the L4-5 facets bilaterally, degenerative in appearance.   Conus medullaris and cauda equina: Conus extends to the L1 level. Conus and cauda equina appear normal.   Paraspinal and other soft tissues: Unremarkable.   Disc levels:   Disc desiccation throughout the lumbar spine. Severe disc space narrowing at L3-4 and L5-S1 and mild narrowing at L4-5.   L1-2: Mild disc bulging and mild facet and ligamentum flavum hypertrophy without stenosis.   L2-3: Mild disc bulging and mild facet and ligamentum flavum hypertrophy without stenosis.   L3-4: Circumferential disc bulging and mild-to-moderate facet and ligamentum flavum hypertrophy result in mild to moderate left greater than right lateral recess stenosis and mild spinal stenosis without neural foraminal stenosis.   L4-5: Anterolisthesis with mild bulging of uncovered disc and moderate to severe facet and  ligamentum flavum hypertrophy result in moderate spinal stenosis and mild bilateral lateral recess stenosis without neural foraminal stenosis. Small synovial cysts posterior to the facet joints bilaterally, not in a position to cause neural impingement.   L5-S1: Disc bulging, endplate spurring, severe disc space height loss, and mild facet hypertrophy result in mild bilateral lateral recess stenosis and mild left greater than right neural foraminal stenosis without spinal stenosis.   IMPRESSION: 1. Multilevel lumbar disc and facet degeneration, most notable at L4-5 where there is moderate spinal stenosis. 2. Mild-to-moderate lateral recess stenosis and mild spinal stenosis at L3-4. 3. Mild lateral recess and neural foraminal stenosis at L5-S1.     Electronically Signed   By: Sebastian Ache M.D.   On: 03/07/2020 17:12   She reports that she has been smoking cigarettes. She has a 45.00 pack-year smoking history. She has never used smokeless tobacco. No results for input(s): "HGBA1C", "LABURIC" in the last 8760 hours.  Objective:  VS:  HT:    WT:   BMI:     BP:   HR: bpm  TEMP: ( )  RESP:  Physical Exam Vitals and nursing note reviewed.  HENT:     Head: Normocephalic and atraumatic.     Right Ear: External ear normal.     Left Ear: External ear normal.  Nose: Nose normal.     Mouth/Throat:     Mouth: Mucous membranes are moist.  Eyes:     Pupils: Pupils are equal, round, and reactive to light.  Cardiovascular:     Rate and Rhythm: Normal rate.  Pulmonary:     Effort: Pulmonary effort is normal.  Abdominal:     General: Abdomen is flat. There is no distension.  Musculoskeletal:        General: Tenderness present.     Cervical back: Normal range of motion.     Comments: Patient is slow to rise from seated position to standing. Good lumbar range of motion. No pain noted with facet loading. 5/5 strength noted with bilateral hip flexion, knee flexion/extension, ankle  dorsiflexion/plantarflexion and EHL. No clonus noted bilaterally. No pain upon palpation of greater trochanters. No pain with internal/external rotation of bilateral hips. Sensation intact bilaterally. Negative slump test bilaterally. Ambulates with cane, gait slow and unsteady.  Skin:    General: Skin is warm and dry.     Capillary Refill: Capillary refill takes less than 2 seconds.  Neurological:     Mental Status: She is alert.     Gait: Gait abnormal.  Psychiatric:        Mood and Affect: Mood normal.        Behavior: Behavior normal.     Ortho Exam  Imaging: No results found.  Past Medical/Family/Surgical/Social History: Medications & Allergies reviewed per EMR, new medications updated. Patient Active Problem List   Diagnosis Date Noted   Caregiver stress 09/10/2021   Vitamin B12 deficiency 02/01/2021   Chronic pain of right knee 02/01/2021   Status post total replacement of right hip 11/19/2020   Cigarette nicotine dependence without complication 08/30/2020   Chronic HFrEF (heart failure with reduced ejection fraction) (HCC) 08/19/2020   Aortic atherosclerosis (HCC) 05/01/2020   Chronic bilateral low back pain without sciatica 02/17/2020   Anxiety and depression 07/30/2017   CAD (coronary artery disease) 07/30/2017   Insomnia 07/30/2017   Osteoporosis 02/27/2016   Seizures (HCC) 06/07/2015   Gastroesophageal reflux disease without esophagitis 06/07/2015   COPD (chronic obstructive pulmonary disease) with chronic bronchitis 08/14/2014   Essential hypertension 08/14/2014   Pure hypercholesterolemia 08/14/2014   Past Medical History:  Diagnosis Date   Allergy    Anxiety    agoraphobia    Arthritis    Chicken pox    COPD (chronic obstructive pulmonary disease) (HCC)    Depression    Family history of adverse reaction to anesthesia    PONV   GERD (gastroesophageal reflux disease)    Headache    History of blood transfusion    1984   Hyperlipidemia     Hypertension    Migraine    PONV (postoperative nausea and vomiting)    Seizure (HCC)    last seizure 04/2019   Family History  Problem Relation Age of Onset   Stroke Mother    Depression Mother    Arthritis Mother    Heart disease Father    Alcohol abuse Father    Diabetes Father    Osteoporosis Sister    Kidney disease Sister    Arthritis Sister    Dementia Sister    Arthritis Maternal Grandmother    Heart disease Maternal Grandmother    Hyperlipidemia Maternal Grandmother    Diabetes Paternal Grandmother    Breast cancer Neg Hx    Past Surgical History:  Procedure Laterality Date   APPENDECTOMY  1984  CATARACT EXTRACTION, BILATERAL Bilateral    COLONOSCOPY WITH PROPOFOL N/A 03/26/2017   Procedure: COLONOSCOPY WITH PROPOFOL;  Surgeon: Wyline Mood, MD;  Location: [facility] ENDOSCOPY;  Service: Gastroenterology;  Laterality: N/A;   TOTAL ABDOMINAL HYSTERECTOMY  1984   Total   TOTAL HIP ARTHROPLASTY Right 11/19/2020   Procedure: RIGHT TOTAL HIP ARTHROPLASTY ANTERIOR APPROACH;  Surgeon: Tarry Kos, MD;  Location: [facility] OR;  Service: Orthopedics;  Laterality: Right;  C-3   VENTRAL HERNIA REPAIR N/A 09/16/2019   Procedure: HERNIA REPAIR VENTRAL ADULT, Open;  Surgeon: Duanne Guess, MD;  Location: [facility] ORS;  Service: General;  Laterality: N/A;   WISDOM TOOTH EXTRACTION     Social History   Occupational History    Comment: disabled  Tobacco Use   Smoking status: Every Day    Packs/day: 1.00    Years: 45.00    Additional pack years: 0.00    Total pack years: 45.00    Types: Cigarettes   Smokeless tobacco: Never   Tobacco comments:    reports quitting in feb 2021  Vaping Use   Vaping Use: Never used  Substance and Sexual Activity   Alcohol use: No   Drug use: Yes    Types: Marijuana    Comment: every day   Sexual activity: Never

## 2022-07-01 NOTE — Progress Notes (Signed)
   07/01/22 1305  Fall Screening  Falls in the past year? 1  Number of falls in past year 0  Was there an injury with Fall? 0  Fall Risk Category Calculator 1  Fall Risk  Patient at Risk for Falls Due to History of fall(s);Impaired balance/gait;Impaired mobility  Fall risk Follow up Falls evaluation completed

## 2022-07-04 ENCOUNTER — Ambulatory Visit: Payer: 59 | Admitting: Family Medicine

## 2022-07-07 ENCOUNTER — Telehealth: Payer: Self-pay | Admitting: Physical Medicine and Rehabilitation

## 2022-07-07 ENCOUNTER — Other Ambulatory Visit: Payer: Self-pay | Admitting: Physical Medicine and Rehabilitation

## 2022-07-07 DIAGNOSIS — M5416 Radiculopathy, lumbar region: Secondary | ICD-10-CM

## 2022-07-07 DIAGNOSIS — G894 Chronic pain syndrome: Secondary | ICD-10-CM

## 2022-07-07 DIAGNOSIS — M48062 Spinal stenosis, lumbar region with neurogenic claudication: Secondary | ICD-10-CM

## 2022-07-07 MED ORDER — HYDROCODONE-ACETAMINOPHEN 5-325 MG PO TABS: 1.0000 | ORAL_TABLET | Freq: Three times a day (TID) | ORAL | 0 refills | Status: DC | PRN

## 2022-07-07 NOTE — Telephone Encounter (Signed)
Patient called needing Rx refilled Hydrocodone? Patient advised she was thinking about getting the back injections and would like to speak with [facility] before making a decision.  The number to contact patient is [phone]

## 2022-07-07 NOTE — Progress Notes (Signed)
Spoke with patient via telephone this morning. I refilled short course of Norco, we will proceed with epidural steroid injection.

## 2022-07-10 ENCOUNTER — Telehealth: Payer: Self-pay | Admitting: Unknown Physician Specialty

## 2022-07-10 NOTE — Telephone Encounter (Signed)
Copied from CRM [id number]. Topic: Medicare AWV >> Jul 10, 2022 11:43 AM Payton Doughty wrote: Reason for CRM: LM 07/10/2022 to schedule AWV   Verlee Rossetti; Care Guide Ambulatory Clinical Support [place] [facility] Direct Dial: [phone]

## 2022-07-11 ENCOUNTER — Telehealth: Payer: Self-pay | Admitting: Physical Medicine and Rehabilitation

## 2022-07-11 ENCOUNTER — Other Ambulatory Visit: Payer: Self-pay | Admitting: Physical Medicine and Rehabilitation

## 2022-07-11 MED ORDER — HYDROCODONE-ACETAMINOPHEN 5-325 MG PO TABS: 1.0000 | ORAL_TABLET | Freq: Two times a day (BID) | ORAL | 0 refills | Status: AC | PRN

## 2022-07-11 NOTE — Telephone Encounter (Signed)
Patient called requesting Hydrocodone refill please advise sent to same Pharmacy

## 2022-07-14 ENCOUNTER — Telehealth: Payer: Self-pay | Admitting: Physical Medicine and Rehabilitation

## 2022-07-14 ENCOUNTER — Other Ambulatory Visit: Payer: Self-pay | Admitting: Physical Medicine and Rehabilitation

## 2022-07-14 MED ORDER — ONDANSETRON HCL 4 MG PO TABS: 4.0000 mg | ORAL_TABLET | Freq: Three times a day (TID) | ORAL | 0 refills | Status: DC | PRN

## 2022-07-14 NOTE — Telephone Encounter (Signed)
Patient called in stating she would like to get Nausea medication she gets sick after shots,she would like a call to explain please advise has appt with [facility] 06/17

## 2022-07-14 NOTE — Telephone Encounter (Signed)
Left detailed voicemail informing patient that medication has been sent to pharmacy

## 2022-07-16 ENCOUNTER — Telehealth: Payer: Self-pay | Admitting: Unknown Physician Specialty

## 2022-07-16 NOTE — Telephone Encounter (Signed)
Copied from CRM [id number]. Topic: Referral - Status >> Jul 16, 2022  9:28 AM Macon Large wrote: Reason for CRM: Pt stated she needs a referral for pain management. Cb# [phone]

## 2022-07-16 NOTE — Telephone Encounter (Signed)
Patient states that she thought about it and will wait until she really needs to see the provider and will call if the pain gets worse.

## 2022-07-16 NOTE — Telephone Encounter (Signed)
Patient needs an appointment. She has never seen Clydie Braun for an office visit.

## 2022-07-21 ENCOUNTER — Encounter: Payer: 59 | Admitting: Physical Medicine and Rehabilitation

## 2022-07-23 ENCOUNTER — Emergency Department
Admission: EM | Admit: 2022-07-23 | Discharge: 2022-07-23 | Disposition: A | Discharge status: Home / Self Care | Payer: 59 | Attending: Emergency Medicine | Admitting: Emergency Medicine

## 2022-07-23 ENCOUNTER — Other Ambulatory Visit: Payer: Self-pay

## 2022-07-23 DIAGNOSIS — Z96641 Presence of right artificial hip joint: Secondary | ICD-10-CM | POA: Insufficient documentation

## 2022-07-23 DIAGNOSIS — J449 Chronic obstructive pulmonary disease, unspecified: Secondary | ICD-10-CM | POA: Diagnosis not present

## 2022-07-23 DIAGNOSIS — I5022 Chronic systolic (congestive) heart failure: Secondary | ICD-10-CM | POA: Diagnosis not present

## 2022-07-23 DIAGNOSIS — I110 Hypertensive heart disease with heart failure: Secondary | ICD-10-CM | POA: Insufficient documentation

## 2022-07-23 DIAGNOSIS — I2510 Atherosclerotic heart disease of native coronary artery without angina pectoris: Secondary | ICD-10-CM | POA: Diagnosis not present

## 2022-07-23 DIAGNOSIS — M5416 Radiculopathy, lumbar region: Secondary | ICD-10-CM | POA: Diagnosis not present

## 2022-07-23 DIAGNOSIS — M549 Dorsalgia, unspecified: Secondary | ICD-10-CM | POA: Diagnosis present

## 2022-07-23 MED ORDER — KETOROLAC TROMETHAMINE 15 MG/ML IJ SOLN
15.0000 mg | Freq: Once | INTRAMUSCULAR | Status: AC
  Administered 2022-07-23: 15 mg via INTRAMUSCULAR
  Filled 2022-07-23: qty 1

## 2022-07-23 MED ORDER — OXYCODONE HCL 5 MG PO TABS: 5.0000 mg | ORAL_TABLET | Freq: Four times a day (QID) | ORAL | 0 refills | Status: DC | PRN

## 2022-07-23 MED ORDER — DEXAMETHASONE SODIUM PHOSPHATE 10 MG/ML IJ SOLN
10.0000 mg | Freq: Once | INTRAMUSCULAR | Status: AC
  Administered 2022-07-23: 10 mg via INTRAMUSCULAR
  Filled 2022-07-23: qty 1

## 2022-07-23 MED ORDER — PREDNISONE 10 MG (21) PO TBPK: ORAL_TABLET | ORAL | 0 refills | Status: DC

## 2022-07-23 MED ORDER — NAPROXEN 500 MG PO TABS: 500.0000 mg | ORAL_TABLET | Freq: Two times a day (BID) | ORAL | 0 refills | Status: AC

## 2022-07-23 MED ORDER — LIDOCAINE 5 % EX PTCH
1.0000 | MEDICATED_PATCH | CUTANEOUS | Status: DC
  Administered 2022-07-23: 1 via TRANSDERMAL
  Filled 2022-07-23: qty 1

## 2022-07-23 NOTE — Discharge Instructions (Signed)
Take the medications as prescribed.  Please avoid any heavy lifting or bending at the waist.  You have been referred to physical therapy.  You may also follow-up with the spine surgeon.  Please return to the emergency department for any new, worsening, or changing symptoms or other concerns including weakness in your legs, urinary or stool incontinence or retention, numbness or tingling in your extremities/buttocks/groin, fevers, or any other concerns or change in symptoms.

## 2022-07-23 NOTE — ED Provider Notes (Signed)
[facility] Provider Note    Event Date/Time   First MD Initiated Contact with Patient 07/23/22 [phone]     (approximate)   History   Back Pain   HPI  Kayla Shah is a 67 y.o. female with a past medical history of COPD, heart failure, CAD, hypertension who presents today for evaluation of back pain with radiation down her right leg.  Patient reports that this has been a recurrent problem for her.  She is unsure of any activities that may have exacerbated her injury.  She reports that her pain radiates down her right leg.  She denies any weakness in her leg.  She has not had any urinary or fecal incontinence or retention.  Patient Active Problem List   Diagnosis Date Noted   Caregiver stress 09/10/2021   Vitamin B12 deficiency 02/01/2021   Chronic pain of right knee 02/01/2021   Status post total replacement of right hip 11/19/2020   Cigarette nicotine dependence without complication 08/30/2020   Chronic HFrEF (heart failure with reduced ejection fraction) (HCC) 08/19/2020   Aortic atherosclerosis (HCC) 05/01/2020   Chronic bilateral low back pain without sciatica 02/17/2020   Anxiety and depression 07/30/2017   CAD (coronary artery disease) 07/30/2017   Insomnia 07/30/2017   Osteoporosis 02/27/2016   Seizures (HCC) 06/07/2015   Gastroesophageal reflux disease without esophagitis 06/07/2015   COPD (chronic obstructive pulmonary disease) with chronic bronchitis 08/14/2014   Essential hypertension 08/14/2014   Pure hypercholesterolemia 08/14/2014          Physical Exam   Triage Vital Signs: ED Triage Vitals  Enc Vitals Group     BP 07/23/22 0929 (!) 145/106     Pulse Rate 07/23/22 0929 86     Resp 07/23/22 0929 18     Temp 07/23/22 0928 98.3 F (36.8 C)     Temp src --      SpO2 07/23/22 0929 100 %     Weight 07/23/22 0929 125 lb (56.7 kg)     Height 07/23/22 0929 5\' 5"  (1.651 m)     Head Circumference --      Peak Flow --      Pain  Score 07/23/22 0929 10     Pain Loc --      Pain Edu? --      Excl. in GC? --     Most recent vital signs: Vitals:   07/23/22 0928 07/23/22 0929  BP:  (!) 145/106  Pulse:  86  Resp:  18  Temp: 98.3 F (36.8 C)   SpO2:  100%    Physical Exam Vitals and nursing note reviewed.  Constitutional:      General: Awake and alert. No acute distress.    Appearance: Normal appearance. The patient is normal weight.  HENT:     Head: Normocephalic and atraumatic.     Mouth: Mucous membranes are moist.  Eyes:     General: PERRL. Normal EOMs        Right eye: No discharge.        Left eye: No discharge.     Conjunctiva/sclera: Conjunctivae normal.  Cardiovascular:     Rate and Rhythm: Normal rate and regular rhythm.     Pulses: Normal pulses.  Pulmonary:     Effort: Pulmonary effort is normal. No respiratory distress.     Breath sounds: Normal breath sounds.  Abdominal:     Abdomen is soft. There is no abdominal tenderness. No rebound or guarding.  No distention. Musculoskeletal:        General: No swelling. Normal range of motion.     Cervical back: Normal range of motion and neck supple.  Back: No midline tenderness.  Right-sided lumbar paraspinal muscle tenderness strength and sensation 5/5 to bilateral lower extremities. Normal great toe extension against resistance. Normal sensation throughout feet. Normal patellar reflexes. Positive SLR on the right and positive opposite SLR. Skin:    General: Skin is warm and dry.     Capillary Refill: Capillary refill takes less than 2 seconds.     Findings: No rash.  Neurological:     Mental Status: The patient is awake and alert.      ED Results / Procedures / Treatments   Labs (all labs ordered are listed, but only abnormal results are displayed) Labs Reviewed - No data to display   EKG     RADIOLOGY     PROCEDURES:  Critical Care performed:   Procedures   MEDICATIONS ORDERED IN ED: Medications  lidocaine  (LIDODERM) 5 % 1 patch (1 patch Transdermal Patch Applied 07/23/22 1027)  dexamethasone (DECADRON) injection 10 mg (10 mg Intramuscular Given 07/23/22 1026)  ketorolac (TORADOL) 15 MG/ML injection 15 mg (15 mg Intramuscular Given 07/23/22 1026)     IMPRESSION / MDM / ASSESSMENT AND PLAN / ED COURSE  I reviewed the triage vital signs and the nursing notes.   Differential diagnosis includes, but is not limited to, lumbar radiculopathy, sciatica, muscle strain, muscle spasm.  I reviewed the patient's chart.  Patient was seen in the emergency department on 06/27/2022 at which time she had similar complaints.  She had imaging of her L-spine which revealed significant degenerative disc disease and grade 1 anterior listhesis at L4-L5.  Patient is awake and alert, hemodynamically stable and afebrile.  She is neurologically and neurovascularly intact.  She has a a history of back pain who presents with back pain. She has 5 out of 5 strength with intact sensation to extensor hallucis dorsiflexion and plantarflexion of bilateral lower extremities with normal patellar reflexes bilaterally. Most likely etiology at this point is exacerbation of chronic back pain versus lumbar radiculopathy vs herniated disc. No red flags to indicate patient is at risk for more auspicious process that would require urgent/emergent spinal imaging or subspecialty evaluation at this time. No major trauma, no midline tenderness, no history or physical exam findings to suggest cauda equina syndrome or spinal cord compression. No focal neurological deficits on exam. No constitutional symptoms or history of immunosuppression or IVDA to suggest potential for epidural abscess. Not anticoagulated, no history of bleeding diastasis to suggest risk for epidural hematoma. No chronic steroid use or advanced age or history of malignancy to suggest proclivity towards pathological fracture.  No abdominal pain or flank pain to suggest kidney stone, no  history of kidney stone.  No fever or dysuria or CVAT to suggest pyelonephritis.  No chest pain, back pain, shortness of breath, neurological deficits, to suggest vascular catastrophe, and pulses are equal in all 4 extremities.  Discussed care instructions and return precautions with patient. Recommended close outpatient follow-up for re-evaluation. Patient agrees with plan of care. Will treat the patient symptomatically as needed for pain control. Will discharge patient to take these medications and return for any worsening or different pain or development of any neurologic symptoms. Educated patient regarding expected time course for back pain to improve and recommended very close outpatient follow-up.  She was also referred to physical therapy.  Patient  reports that the naproxen hurts her stomach and causes headache, and she is requesting oxycodone.  She did tolerate the Toradol given to her in the emergency department without difficulty.  She was advised that this medication is highly addictive and should only be used for breakthrough pain.  She is also advised that she cannot drive, operate heavy machinery, or perform any test that require concentration while taking this medication.  Patient's presentation is most consistent with exacerbation of chronic illness.    FINAL CLINICAL IMPRESSION(S) / ED DIAGNOSES   Final diagnoses:  Lumbar radiculopathy     Rx / DC Orders   ED Discharge Orders          Ordered    naproxen (NAPROSYN) 500 MG tablet  2 times daily with meals        07/23/22 1059    predniSONE (STERAPRED UNI-PAK 21 TAB) 10 MG (21) TBPK tablet        07/23/22 1059    Ambulatory referral to Physical Therapy        07/23/22 1059    oxyCODONE (ROXICODONE) 5 MG immediate release tablet  Every 6 hours PRN        07/23/22 1121             Note:  This document was prepared using Dragon voice recognition software and may include unintentional dictation errors.   Jackelyn Hoehn, PA-C 07/23/22 1250    Corena Herter, MD 07/23/22 1546

## 2022-07-23 NOTE — ED Triage Notes (Signed)
Pt to ED for lower back pain radiating down right leg for several months.

## 2022-07-25 ENCOUNTER — Telehealth: Payer: Self-pay

## 2022-07-25 NOTE — Telephone Encounter (Signed)
Patient left a voicemail requesting a refill on medications for pain. It looks like she went to the ER on 07/23/22 and received medications

## 2022-07-28 ENCOUNTER — Ambulatory Visit: Payer: 59 | Admitting: Physical Therapy

## 2022-07-28 NOTE — Therapy (Incomplete)
OUTPATIENT PHYSICAL THERAPY THORACOLUMBAR EVALUATION   Patient Name: Kayla Shah [facility] MRN: [id number] DOB:October 21, 1955, 67 y.o., female Today's Date: 07/28/2022  END OF SESSION:   Past Medical History:  Diagnosis Date   Allergy    Anxiety    agoraphobia    Arthritis    Chicken pox    COPD (chronic obstructive pulmonary disease) (HCC)    Depression    Family history of adverse reaction to anesthesia    PONV   GERD (gastroesophageal reflux disease)    Headache    History of blood transfusion    1984   Hyperlipidemia    Hypertension    Migraine    PONV (postoperative nausea and vomiting)    Seizure (HCC)    last seizure 04/2019   Past Surgical History:  Procedure Laterality Date   APPENDECTOMY  1984   CATARACT EXTRACTION, BILATERAL Bilateral    COLONOSCOPY WITH PROPOFOL N/A 03/26/2017   Procedure: COLONOSCOPY WITH PROPOFOL;  Surgeon: Wyline Mood, MD;  Location: [facility] ENDOSCOPY;  Service: Gastroenterology;  Laterality: N/A;   TOTAL ABDOMINAL HYSTERECTOMY  1984   Total   TOTAL HIP ARTHROPLASTY Right 11/19/2020   Procedure: RIGHT TOTAL HIP ARTHROPLASTY ANTERIOR APPROACH;  Surgeon: Tarry Kos, MD;  Location: [facility] OR;  Service: Orthopedics;  Laterality: Right;  C-3   VENTRAL HERNIA REPAIR N/A 09/16/2019   Procedure: HERNIA REPAIR VENTRAL ADULT, Open;  Surgeon: Duanne Guess, MD;  Location: [facility] ORS;  Service: General;  Laterality: N/A;   WISDOM TOOTH EXTRACTION     Patient Active Problem List   Diagnosis Date Noted   Caregiver stress 09/10/2021   Vitamin B12 deficiency 02/01/2021   Chronic pain of right knee 02/01/2021   Status post total replacement of right hip 11/19/2020   Cigarette nicotine dependence without complication 08/30/2020   Chronic HFrEF (heart failure with reduced ejection fraction) (HCC) 08/19/2020   Aortic atherosclerosis (HCC) 05/01/2020   Chronic bilateral low back pain without sciatica 02/17/2020   Anxiety and depression 07/30/2017   CAD  (coronary artery disease) 07/30/2017   Insomnia 07/30/2017   Osteoporosis 02/27/2016   Seizures (HCC) 06/07/2015   Gastroesophageal reflux disease without esophagitis 06/07/2015   COPD (chronic obstructive pulmonary disease) with chronic bronchitis 08/14/2014   Essential hypertension 08/14/2014   Pure hypercholesterolemia 08/14/2014    PCP: Gabriel Cirri, NP  REFERRING PROVIDER: Gabriel Cirri, NP  REFERRING DIAG: Lumbar radiculopathy   Rationale for Evaluation and Treatment: Rehabilitation  THERAPY DIAG:  No diagnosis found.  ONSET DATE: ***  SUBJECTIVE:  SUBJECTIVE STATEMENT: ***  PERTINENT HISTORY:  Recently seen in the ED on 07/23/22 for back pain with radiation down her right leg. Reports in the ED that this is a recurrent problem for her and unsure if any activities exacerbated her injury. Denies weakness in her leg and has not had any urinary or fecal incontinence or retention. Was also seen 06/27/22 for similar symptoms.   PAIN:  Are you having pain? {OPRCPAIN:27236}  PRECAUTIONS: {Therapy precautions:24002}  WEIGHT BEARING RESTRICTIONS: No  FALLS:  Has patient fallen in last 6 months? {fallsyesno:27318}  LIVING ENVIRONMENT: Lives with: {OPRC lives with:25569::"lives with their family"} Lives in: {Lives in:25570} Stairs: {opstairs:27293} Has following equipment at home: {Assistive devices:23999}  OCCUPATION: ***  PLOF: {PLOF:24004}  PATIENT GOALS: ***  NEXT MD VISIT: ***  OBJECTIVE:   DIAGNOSTIC FINDINGS:  Image of the lumbar spine shows a lower lumbar spine spondylosis and degenerative disc disease with grade 1 degenerative anterior listhesis at L4-L5. Stable, mild superior endplate concavity at L2.   PATIENT SURVEYS:  FOTO ***  SCREENING FOR RED FLAGS: Bowel or  bladder incontinence: {Yes/No:304960894} Spinal tumors: No Cauda equina syndrome: {Yes/No:304960894} Compression fracture: No Abdominal aneurysm: No  COGNITION: Overall cognitive status: {cognition:24006}     SENSATION: {sensation:27233}  MUSCLE LENGTH: Hamstrings: Right *** deg; Left *** deg Thomas test: Right *** deg; Left *** deg  POSTURE: {posture:25561}  PALPATION: ***  LUMBAR ROM:   AROM eval  Flexion   Extension   Right lateral flexion   Left lateral flexion   Right rotation   Left rotation    (Blank rows = not tested)  LOWER EXTREMITY ROM:     {AROM/PROM:27142}  Right eval Left eval  Hip flexion    Hip extension    Hip abduction    Hip adduction    Hip internal rotation    Hip external rotation    Knee flexion    Knee extension    Ankle dorsiflexion    Ankle plantarflexion    Ankle inversion    Ankle eversion     (Blank rows = not tested)  LOWER EXTREMITY MMT:    MMT Right eval Left eval  Hip flexion    Hip extension    Hip abduction    Hip adduction    Hip internal rotation    Hip external rotation    Knee flexion    Knee extension    Ankle dorsiflexion    Ankle plantarflexion    Ankle inversion    Ankle eversion     (Blank rows = not tested)  LUMBAR SPECIAL TESTS:  {lumbar special test:25242}  FUNCTIONAL TESTS:  {Functional tests:24029}  GAIT: Distance walked: *** Assistive device utilized: {Assistive devices:23999} Level of assistance: {Levels of assistance:24026} Comments: ***  TODAY'S TREATMENT:  DATE: 07/28/22     PATIENT EDUCATION:  Education details: *** Person educated: {Person educated:25204} Education method: {Education Method:25205} Education comprehension: {Education Comprehension:25206}  HOME EXERCISE PROGRAM: ***  ASSESSMENT:  CLINICAL IMPRESSION: Patient is a 67 y.o. female  who was seen today for physical therapy evaluation and treatment for back pain with radiculopathy.   OBJECTIVE IMPAIRMENTS: {opptimpairments:25111}.   ACTIVITY LIMITATIONS: {activitylimitations:27494}  PARTICIPATION LIMITATIONS: {participationrestrictions:25113}  PERSONAL FACTORS: {Personal factors:25162} are also affecting patient's functional outcome.   REHAB POTENTIAL: {rehabpotential:25112}  CLINICAL DECISION MAKING: {clinical decision making:25114}  EVALUATION COMPLEXITY: {Evaluation complexity:25115}   GOALS: Goals reviewed with patient? Yes  SHORT TERM GOALS: Target date: ***  *** Baseline: Goal status: {GOALSTATUS:25110}  2.  *** Baseline:  Goal status: {GOALSTATUS:25110}  3.  *** Baseline:  Goal status: {GOALSTATUS:25110}  4.  *** Baseline:  Goal status: {GOALSTATUS:25110}  5.  *** Baseline:  Goal status: {GOALSTATUS:25110}  6.  *** Baseline:  Goal status: {GOALSTATUS:25110}  LONG TERM GOALS: Target date: ***  *** Baseline:  Goal status: {GOALSTATUS:25110}  2.  *** Baseline:  Goal status: {GOALSTATUS:25110}  3.  *** Baseline:  Goal status: {GOALSTATUS:25110}  4.  *** Baseline:  Goal status: {GOALSTATUS:25110}  5.  *** Baseline:  Goal status: {GOALSTATUS:25110}  6.  *** Baseline:  Goal status: {GOALSTATUS:25110}  PLAN:  PT FREQUENCY: {rehab frequency:25116}  PT DURATION: {rehab duration:25117}  PLANNED INTERVENTIONS: {rehab planned interventions:25118::"Therapeutic exercises","Therapeutic activity","Neuromuscular re-education","Balance training","Gait training","Patient/Family education","Self Care","Joint mobilization"}.  PLAN FOR NEXT SESSION: ***   Maylon Peppers, PT, DPT Physical Therapist - [place]  [facility]  07/28/2022, 8:24 AM

## 2022-07-30 ENCOUNTER — Ambulatory Visit: Payer: 59 | Admitting: Physical Therapy

## 2022-07-30 ENCOUNTER — Telehealth: Payer: Self-pay

## 2022-07-30 NOTE — Telephone Encounter (Signed)
Transition Care Management Follow-up Telephone Call Date of discharge and from where: 07/23/2022 [facility] How have you been since you were released from the hospital? Patient is feeling better Any questions or concerns? No  Items Reviewed: Did the pt receive and understand the discharge instructions provided? Yes  Medications obtained and verified? Yes  Other? No  Any new allergies since your discharge? No  Dietary orders reviewed? Yes Do you have support at home? Yes   Follow up appointments reviewed:  PCP Hospital f/u appt confirmed? No  Scheduled to see  on  @ . Specialist Hospital f/u appt confirmed? No  Scheduled to see  on  @ . Are transportation arrangements needed? No  If their condition worsens, is the pt aware to call PCP or go to the Emergency Dept.? Yes Was the patient provided with contact information for the PCP's office or ED? Yes Was to pt encouraged to call back with questions or concerns? Yes  Laren Whaling [facility]  [facility] Population Health Community Resource Care Guide   [email or web address]  ?? [phone]   Website: [email or web address]  [email or web address]

## 2022-07-31 ENCOUNTER — Telehealth: Payer: Self-pay | Admitting: Family Medicine

## 2022-07-31 NOTE — Telephone Encounter (Signed)
Copied from CRM [id number]. Topic: Medicare AWV >> Jul 31, 2022 10:27 AM Payton Doughty wrote: Reason for CRM: LM 07/31/2022 to schedule AWV   Verlee Rossetti; Care Guide Ambulatory Clinical Support [facility] Direct Dial: [phone]

## 2022-07-31 NOTE — Telephone Encounter (Signed)
Copied from CRM [id number]. Topic: Medicare AWV >> Jul 31, 2022 10:47 AM Macon Large wrote: Reason for CRM: Pt called back and stated she can not schedule an appt at this time but she will call back to schedule the appt

## 2022-08-04 ENCOUNTER — Encounter: Payer: 59 | Admitting: Physical Therapy

## 2022-08-06 ENCOUNTER — Encounter: Payer: 59 | Admitting: Physical Therapy

## 2022-08-11 ENCOUNTER — Encounter: Payer: 59 | Admitting: Physical Therapy

## 2022-08-11 ENCOUNTER — Telehealth: Payer: Self-pay | Admitting: Orthopedic Surgery

## 2022-08-11 ENCOUNTER — Other Ambulatory Visit: Payer: Self-pay | Admitting: Nurse Practitioner

## 2022-08-11 DIAGNOSIS — I10 Essential (primary) hypertension: Secondary | ICD-10-CM

## 2022-08-11 NOTE — Telephone Encounter (Signed)
Kayla Shah called in saying that she is in a severe amount of pain and needs something called in to the pharmacy.  She is having trouble walking due to pain and is taking ibuprofen with no relief.  She states that she is "begging for something strong" for the pain, possibly hydrocodone or oxycodone.  She uses the [facility] in [place]. Call back # is [phone].

## 2022-08-12 ENCOUNTER — Telehealth: Payer: Self-pay | Admitting: Physical Medicine and Rehabilitation

## 2022-08-12 NOTE — Telephone Encounter (Signed)
Pt called to cancel appt. Please call pt for confirmation that appt has been canceled. Pt phone number is [phone].

## 2022-08-12 NOTE — Telephone Encounter (Signed)
Requested Prescriptions  Pending Prescriptions Disp Refills   verapamil (CALAN-SR) 240 MG CR tablet [Pharmacy Med Name: VERAPAMIL HCL ER 240 MG TAB] 90 tablet 0    Sig: TAKE 1 TABLET BY MOUTH DAILY     Cardiovascular: Calcium Channel Blockers 3 Failed - 08/11/2022  4:52 PM      Failed - Cr in normal range and within 360 days    Creatinine  Date Value Ref Range Status  08/28/2013 1.27 0.60 - 1.30 mg/dL Final   Creatinine, Ser  Date Value Ref Range Status  10/15/2021 1.06 (H) 0.57 - 1.00 mg/dL Final         Failed - Last BP in normal range    BP Readings from Last 1 Encounters:  07/23/22 (!) 145/106         Passed - ALT in normal range and within 360 days    ALT  Date Value Ref Range Status  10/15/2021 8 0 - 32 IU/L Final   SGPT (ALT)  Date Value Ref Range Status  08/28/2013 16 U/L Final    Comment:    14-63 NOTE: New Reference Range 08/23/13          Passed - AST in normal range and within 360 days    AST  Date Value Ref Range Status  10/15/2021 10 0 - 40 IU/L Final   SGOT(AST)  Date Value Ref Range Status  08/28/2013 23 15 - 37 Unit/L Final         Passed - Last Heart Rate in normal range    Pulse Readings from Last 1 Encounters:  07/23/22 86         Passed - Valid encounter within last 6 months    Recent Outpatient Visits           3 months ago Gastroesophageal reflux disease without esophagitis   [place] [facility] Gabriel Cirri, NP   10 months ago Essential hypertension   [place] [facility] Gabriel Cirri, NP   11 months ago Pure hypercholesterolemia   [place] [facility] Gabriel Cirri, NP   1 year ago Hot flashes due to surgical menopause   [place] [facility] Mecum, Oswaldo Conroy, PA-C   1 year ago Neck swelling   [place] [facility] Loura Pardon, MD

## 2022-08-13 ENCOUNTER — Encounter: Payer: 59 | Admitting: Physical Therapy

## 2022-08-13 MED ORDER — OXYCODONE HCL 5 MG PO TABS: 5.0000 mg | ORAL_TABLET | Freq: Three times a day (TID) | ORAL | 0 refills | Status: AC | PRN

## 2022-08-13 NOTE — Telephone Encounter (Signed)
Spoke with patient and she is going to get the injection. She would like something that will help with the pain until then. Please send to [facility]

## 2022-08-13 NOTE — Addendum Note (Signed)
Addended by: Ashok Norris on: 08/13/2022 10:18 AM   Modules accepted: Orders

## 2022-08-13 NOTE — Telephone Encounter (Signed)
Spoke with patient and there was a misunderstanding with appointment being cancelled. Rescheduled for 08/21/22 but informed patient that I would call if sooner appointment became available

## 2022-08-18 ENCOUNTER — Other Ambulatory Visit: Payer: Self-pay | Admitting: Physical Medicine and Rehabilitation

## 2022-08-18 ENCOUNTER — Encounter: Payer: 59 | Admitting: Physical Medicine and Rehabilitation

## 2022-08-18 ENCOUNTER — Telehealth: Payer: Self-pay | Admitting: Physical Medicine and Rehabilitation

## 2022-08-18 ENCOUNTER — Encounter: Payer: 59 | Admitting: Physical Therapy

## 2022-08-18 MED ORDER — PROMETHAZINE HCL 25 MG PO TABS: ORAL_TABLET | ORAL | 0 refills | Status: DC

## 2022-08-18 NOTE — Telephone Encounter (Signed)
Pt has questions about medication before injections

## 2022-08-18 NOTE — Progress Notes (Signed)
Spoke with patient this afternoon, she is requesting oral Phenergan prior to lumbar epidural steroid injection. I called this in for her and provided instructions on use. She has no questions at this time.

## 2022-08-20 ENCOUNTER — Encounter: Payer: 59 | Admitting: Physical Therapy

## 2022-08-21 ENCOUNTER — Ambulatory Visit (INDEPENDENT_AMBULATORY_CARE_PROVIDER_SITE_OTHER): Payer: 59 | Admitting: Physical Medicine and Rehabilitation

## 2022-08-21 ENCOUNTER — Other Ambulatory Visit: Payer: Self-pay

## 2022-08-21 VITALS — BP 142/97 | HR 108

## 2022-08-21 DIAGNOSIS — M5416 Radiculopathy, lumbar region: Secondary | ICD-10-CM | POA: Diagnosis not present

## 2022-08-21 MED ORDER — METHYLPREDNISOLONE ACETATE 80 MG/ML IJ SUSP
80.0000 mg | Freq: Once | INTRAMUSCULAR | Status: AC
Start: 2022-08-21 — End: 2022-08-21
  Administered 2022-08-21: 80 mg

## 2022-08-21 NOTE — Patient Instructions (Signed)

## 2022-08-21 NOTE — Progress Notes (Signed)
Functional Pain Scale - descriptive words and definitions  Unmanageable (7)  Pain interferes with normal ADL's/nothing seems to help/sleep is very difficult/active distractions are very difficult to concentrate on. Severe range order  Average Pain 9   +Driver, -BT, -Dye Allergies.  Lower back pain on right side

## 2022-08-25 ENCOUNTER — Encounter: Payer: 59 | Admitting: Physical Therapy

## 2022-08-27 ENCOUNTER — Encounter: Payer: 59 | Admitting: Physical Therapy

## 2022-08-27 ENCOUNTER — Telehealth: Payer: Self-pay

## 2022-08-27 NOTE — Telephone Encounter (Signed)
Patient called needed information about back injection.

## 2022-08-29 ENCOUNTER — Other Ambulatory Visit: Payer: Self-pay | Admitting: Physician Assistant

## 2022-08-29 ENCOUNTER — Telehealth: Payer: Self-pay | Admitting: Orthopaedic Surgery

## 2022-08-29 ENCOUNTER — Telehealth: Payer: Self-pay | Admitting: Physician Assistant

## 2022-08-29 MED ORDER — ACETAMINOPHEN-CODEINE 300-30 MG PO TABS: 1.0000 | ORAL_TABLET | Freq: Four times a day (QID) | ORAL | 0 refills | Status: DC | PRN

## 2022-08-29 NOTE — Telephone Encounter (Signed)
I can send in tramadol or tylenol 3, but nothing stronger

## 2022-08-29 NOTE — Telephone Encounter (Signed)
Patient called asked if she can get something for pain until her appointment 09/04/2022. Patient said she was prescribed Oxycodone by Dr. Alvester Morin.   The number to contact patient is [phone]

## 2022-08-29 NOTE — Telephone Encounter (Signed)
Patient aware this was called in for her  

## 2022-08-29 NOTE — Telephone Encounter (Signed)
Patient called. Says Tylenol #3 will be ok to send in. Would like a call.

## 2022-08-29 NOTE — Telephone Encounter (Signed)
Patient called wanted to get the Tylenol 3 or what you have offered. [phone]

## 2022-08-29 NOTE — Telephone Encounter (Signed)
Spoke with patient. She wants stronger narcotics because the tramadol and tylenol will both make her sick

## 2022-08-29 NOTE — Telephone Encounter (Signed)
Patient called back asking if the Tylenol can be called into the pharmacy for her. Patient asked if someone would call her when sent to the pharmacy.  The number to contact patient is [phone]

## 2022-09-01 ENCOUNTER — Encounter: Payer: 59 | Admitting: Physical Therapy

## 2022-09-01 NOTE — Procedures (Signed)
Lumbosacral Transforaminal Epidural Steroid Injection - Sub-Pedicular Approach with Fluoroscopic Guidance  Patient: Kayla Shah      Date of Birth: 17-Oct-1955 MRN: [id number] PCP: Dorcas Carrow, DO      Visit Date: 08/21/2022   Universal Protocol:    Date/Time: 08/21/2022  Consent Given By: the patient  Position: PRONE  Additional Comments: Vital signs were monitored before and after the procedure. Patient was prepped and draped in the usual sterile fashion. The correct patient, procedure, and site was verified.   Injection Procedure Details:   Procedure diagnoses: Lumbar radiculopathy [M54.16]    Meds Administered:  Meds ordered this encounter  Medications   methylPREDNISolone acetate (DEPO-MEDROL) injection 80 mg    Laterality: Right  Location/Site: L4  Needle:5.0 in., 22 ga.  Short bevel or Quincke spinal needle  Needle Placement: Transforaminal  Findings:    -Comments: Excellent flow of contrast along the nerve, nerve root and into the epidural space.  Procedure Details: After squaring off the end-plates to get a true AP view, the C-arm was positioned so that an oblique view of the foramen as noted above was visualized. The target area is just inferior to the "nose of the scotty dog" or sub pedicular. The soft tissues overlying this structure were infiltrated with 2-3 ml. of 1% Lidocaine without Epinephrine.  The spinal needle was inserted toward the target using a "trajectory" view along the fluoroscope beam.  Under AP and lateral visualization, the needle was advanced so it did not puncture dura and was located close the 6 O'Clock position of the pedical in AP tracterory. Biplanar projections were used to confirm position. Aspiration was confirmed to be negative for CSF and/or blood. A 1-2 ml. volume of Isovue-250 was injected and flow of contrast was noted at each level. Radiographs were obtained for documentation purposes.   After attaining the desired  flow of contrast documented above, a 0.5 to 1.0 ml test dose of 0.25% Marcaine was injected into each respective transforaminal space.  The patient was observed for 90 seconds post injection.  After no sensory deficits were reported, and normal lower extremity motor function was noted,   the above injectate was administered so that equal amounts of the injectate were placed at each foramen (level) into the transforaminal epidural space.   Additional Comments:  No complications occurred Dressing: 2 x 2 sterile gauze and Band-Aid    Post-procedure details: Patient was observed during the procedure. Post-procedure instructions were reviewed.  Patient left the clinic in stable condition.

## 2022-09-01 NOTE — Progress Notes (Signed)
Kayla Shah - 67 y.o. female MRN [id number]  Date of birth: November 12, 1955  Office Visit Note: Visit Date: 08/21/2022 PCP: Dorcas Carrow, DO Referred by: Gabriel Cirri, NP  Subjective: Chief Complaint  Patient presents with   Lower Back - Pain   HPI:  Kayla Shah is a 67 y.o. female who comes in today at the request of Ellin Goodie, FNP for planned Right L4-5 Lumbar Transforaminal epidural steroid injection with fluoroscopic guidance.  The patient has failed conservative care including home exercise, medications, time and activity modification.  This injection will be diagnostic and hopefully therapeutic.  Please see requesting physician notes for further details and justification.   ROS Otherwise per HPI.  Assessment & Plan: Visit Diagnoses:    ICD-10-CM   1. Lumbar radiculopathy  M54.16 XR C-ARM NO REPORT    Epidural Steroid injection    methylPREDNISolone acetate (DEPO-MEDROL) injection 80 mg      Plan: No additional findings.   Meds & Orders:  Meds ordered this encounter  Medications   methylPREDNISolone acetate (DEPO-MEDROL) injection 80 mg    Orders Placed This Encounter  Procedures   XR C-ARM NO REPORT   Epidural Steroid injection    Follow-up: Return for visit to requesting provider as needed.   Procedures: No procedures performed  Lumbosacral Transforaminal Epidural Steroid Injection - Sub-Pedicular Approach with Fluoroscopic Guidance  Patient: Kayla Shah      Date of Birth: 01/13/1956 MRN: [id number] PCP: Dorcas Carrow, DO      Visit Date: 08/21/2022   Universal Protocol:    Date/Time: 08/21/2022  Consent Given By: the patient  Position: PRONE  Additional Comments: Vital signs were monitored before and after the procedure. Patient was prepped and draped in the usual sterile fashion. The correct patient, procedure, and site was verified.   Injection Procedure Details:   Procedure diagnoses: Lumbar radiculopathy  [M54.16]    Meds Administered:  Meds ordered this encounter  Medications   methylPREDNISolone acetate (DEPO-MEDROL) injection 80 mg    Laterality: Right  Location/Site: L4  Needle:5.0 in., 22 ga.  Short bevel or Quincke spinal needle  Needle Placement: Transforaminal  Findings:    -Comments: Excellent flow of contrast along the nerve, nerve root and into the epidural space.  Procedure Details: After squaring off the end-plates to get a true AP view, the C-arm was positioned so that an oblique view of the foramen as noted above was visualized. The target area is just inferior to the "nose of the scotty dog" or sub pedicular. The soft tissues overlying this structure were infiltrated with 2-3 ml. of 1% Lidocaine without Epinephrine.  The spinal needle was inserted toward the target using a "trajectory" view along the fluoroscope beam.  Under AP and lateral visualization, the needle was advanced so it did not puncture dura and was located close the 6 O'Clock position of the pedical in AP tracterory. Biplanar projections were used to confirm position. Aspiration was confirmed to be negative for CSF and/or blood. A 1-2 ml. volume of Isovue-250 was injected and flow of contrast was noted at each level. Radiographs were obtained for documentation purposes.   After attaining the desired flow of contrast documented above, a 0.5 to 1.0 ml test dose of 0.25% Marcaine was injected into each respective transforaminal space.  The patient was observed for 90 seconds post injection.  After no sensory deficits were reported, and normal lower extremity motor function was noted,   the above injectate was  administered so that equal amounts of the injectate were placed at each foramen (level) into the transforaminal epidural space.   Additional Comments:  No complications occurred Dressing: 2 x 2 sterile gauze and Band-Aid    Post-procedure details: Patient was observed during the  procedure. Post-procedure instructions were reviewed.  Patient left the clinic in stable condition.    Clinical History: EXAM: MRI LUMBAR SPINE WITHOUT CONTRAST   TECHNIQUE: Multiplanar, multisequence MR imaging of the lumbar spine was performed. No intravenous contrast was administered.   COMPARISON:  CT abdomen and pelvis 05/01/2019   FINDINGS: Segmentation:  Standard.   Alignment:  Trace facet mediated anterolisthesis of L4 on L5.   Vertebrae: Mild chronic T12 and L2 superior endplate compression fractures, unchanged. No acute fracture or suspicious marrow lesion. Mild marrow edema about the L4-5 facets bilaterally, degenerative in appearance.   Conus medullaris and cauda equina: Conus extends to the L1 level. Conus and cauda equina appear normal.   Paraspinal and other soft tissues: Unremarkable.   Disc levels:   Disc desiccation throughout the lumbar spine. Severe disc space narrowing at L3-4 and L5-S1 and mild narrowing at L4-5.   L1-2: Mild disc bulging and mild facet and ligamentum flavum hypertrophy without stenosis.   L2-3: Mild disc bulging and mild facet and ligamentum flavum hypertrophy without stenosis.   L3-4: Circumferential disc bulging and mild-to-moderate facet and ligamentum flavum hypertrophy result in mild to moderate left greater than right lateral recess stenosis and mild spinal stenosis without neural foraminal stenosis.   L4-5: Anterolisthesis with mild bulging of uncovered disc and moderate to severe facet and ligamentum flavum hypertrophy result in moderate spinal stenosis and mild bilateral lateral recess stenosis without neural foraminal stenosis. Small synovial cysts posterior to the facet joints bilaterally, not in a position to cause neural impingement.   L5-S1: Disc bulging, endplate spurring, severe disc space height loss, and mild facet hypertrophy result in mild bilateral lateral recess stenosis and mild left greater than  right neural foraminal stenosis without spinal stenosis.   IMPRESSION: 1. Multilevel lumbar disc and facet degeneration, most notable at L4-5 where there is moderate spinal stenosis. 2. Mild-to-moderate lateral recess stenosis and mild spinal stenosis at L3-4. 3. Mild lateral recess and neural foraminal stenosis at L5-S1.     Electronically Signed   By: Sebastian Ache M.D.   On: 03/07/2020 17:12     Objective:  VS:  HT:    WT:   BMI:     BP:(!) 142/97  HR:(!) 108bpm  TEMP: ( )  RESP:  Physical Exam Vitals and nursing note reviewed.  Constitutional:      General: She is not in acute distress.    Appearance: Normal appearance. She is not ill-appearing.  HENT:     Head: Normocephalic and atraumatic.     Right Ear: External ear normal.     Left Ear: External ear normal.  Eyes:     Extraocular Movements: Extraocular movements intact.  Cardiovascular:     Rate and Rhythm: Normal rate.     Pulses: Normal pulses.  Pulmonary:     Effort: Pulmonary effort is normal. No respiratory distress.  Abdominal:     General: There is no distension.     Palpations: Abdomen is soft.  Musculoskeletal:        General: Tenderness present.     Cervical back: Neck supple.     Right lower leg: No edema.     Left lower leg: No edema.  Comments: Patient has good distal strength with no pain over the greater trochanters.  No clonus or focal weakness.  Skin:    Findings: No erythema, lesion or rash.  Neurological:     General: No focal deficit present.     Mental Status: She is alert and oriented to person, place, and time.     Sensory: No sensory deficit.     Motor: No weakness or abnormal muscle tone.     Coordination: Coordination normal.  Psychiatric:        Mood and Affect: Mood normal.        Behavior: Behavior normal.      Imaging: No results found.

## 2022-09-03 ENCOUNTER — Telehealth: Payer: Self-pay | Admitting: Orthopedic Surgery

## 2022-09-03 ENCOUNTER — Encounter: Payer: 59 | Admitting: Physical Therapy

## 2022-09-03 NOTE — Telephone Encounter (Signed)
Kayla Shah called in requesting that I send a message back to Dr. Warren Danes team clarifying her ask for pain medication.  She would like it known that she did not ask for stronger narcotics, she just stated that she cannot take Tylenol with codeine because it makes her very sick.  She states that she "got pissed off and threw that prescription in the trash" because she knew she was unable to take it. She has an appt with Dr. Roda Shutters tomorrow.

## 2022-09-03 NOTE — Telephone Encounter (Signed)
Ok, thanks.  I just looked at the message thread.Marland KitchenMarland Kitchen

## 2022-09-04 ENCOUNTER — Other Ambulatory Visit (INDEPENDENT_AMBULATORY_CARE_PROVIDER_SITE_OTHER): Payer: 59

## 2022-09-04 ENCOUNTER — Ambulatory Visit (INDEPENDENT_AMBULATORY_CARE_PROVIDER_SITE_OTHER): Payer: 59 | Admitting: Orthopaedic Surgery

## 2022-09-04 ENCOUNTER — Encounter: Payer: Self-pay | Admitting: Orthopaedic Surgery

## 2022-09-04 DIAGNOSIS — G8929 Other chronic pain: Secondary | ICD-10-CM

## 2022-09-04 DIAGNOSIS — M25561 Pain in right knee: Secondary | ICD-10-CM | POA: Diagnosis not present

## 2022-09-04 DIAGNOSIS — M25562 Pain in left knee: Secondary | ICD-10-CM

## 2022-09-04 MED ORDER — CELECOXIB 200 MG PO CAPS
200.0000 mg | ORAL_CAPSULE | Freq: Two times a day (BID) | ORAL | 3 refills | Status: DC
Start: 2022-09-04 — End: 2022-09-30

## 2022-09-04 NOTE — Progress Notes (Signed)
Office Visit Note   Patient: [facility]           Date of Birth: [birth year]           MRN: [id number] Visit Date: 09/04/2022              Requested by: Dorcas Carrow, DO [address] [place],  [place] [address] PCP: Dorcas Carrow, DO   Assessment & Plan: Visit Diagnoses:  1. Chronic pain of both knees     Plan: [name] is a 67 year old female with chronic bilateral knee pain.  No focal findings on exam.  Impression is probable bilateral knee osteoarthritis.  Treatment options explained and she would like to try Celebrex.  She declined cortisone injection.  Will provide her with [place].  I explained that we do not treat chronic pain with painkillers.  Will see her back as needed.  Follow-Up Instructions: No follow-ups on file.   Orders:  Orders Placed This Encounter  Procedures   XR KNEE 3 VIEW LEFT   XR KNEE 3 VIEW RIGHT   Meds ordered this encounter  Medications   celecoxib (CELEBREX) 200 MG capsule    Sig: Take 1 capsule (200 mg total) by mouth 2 (two) times daily.    Dispense:  30 capsule    Refill:  3      Procedures: No procedures performed   Clinical Data: No additional findings.   Subjective: Chief Complaint  Patient presents with   Right Knee - Pain   Left Knee - Pain    HPI [name] is a 67 year old female here for evaluation of bilateral knee pain for about 10 months.  Denies any injuries.  Uses a cane for ambulation.  Feels pain throughout the knee.  Denies any mechanical symptoms. Review of Systems  Constitutional: Negative.   HENT: Negative.    Eyes: Negative.   Respiratory: Negative.    Cardiovascular: Negative.   Endocrine: Negative.   Musculoskeletal: Negative.   Neurological: Negative.   Hematological: Negative.   Psychiatric/Behavioral: Negative.    All other systems reviewed and are negative.    Objective: Vital Signs: There were no vitals taken for this visit.  Physical Exam Vitals and nursing note reviewed.   Constitutional:      Appearance: She is well-developed.  HENT:     Head: Atraumatic.     Nose: Nose normal.  Eyes:     Extraocular Movements: Extraocular movements intact.  Cardiovascular:     Pulses: Normal pulses.  Pulmonary:     Effort: Pulmonary effort is normal.  Abdominal:     Palpations: Abdomen is soft.  Musculoskeletal:     Cervical back: Neck supple.  Skin:    General: Skin is warm.     Capillary Refill: Capillary refill takes less than 2 seconds.  Neurological:     Mental Status: She is alert. Mental status is at baseline.  Psychiatric:        Behavior: Behavior normal.        Thought Content: Thought content normal.        Judgment: Judgment normal.     Ortho Exam Examination of bilateral knees show diffuse tenderness to touch.  No joint effusion.  Nonfocal exam. Specialty Comments:  EXAM: MRI LUMBAR SPINE WITHOUT CONTRAST   TECHNIQUE: Multiplanar, multisequence MR imaging of the lumbar spine was performed. No intravenous contrast was administered.   COMPARISON:  CT abdomen and pelvis 05/01/2019   FINDINGS: Segmentation:  Standard.  Alignment:  Trace facet mediated anterolisthesis of L4 on L5.   Vertebrae: Mild chronic T12 and L2 superior endplate compression fractures, unchanged. No acute fracture or suspicious marrow lesion. Mild marrow edema about the L4-5 facets bilaterally, degenerative in appearance.   Conus medullaris and cauda equina: Conus extends to the L1 level. Conus and cauda equina appear normal.   Paraspinal and other soft tissues: Unremarkable.   Disc levels:   Disc desiccation throughout the lumbar spine. Severe disc space narrowing at L3-4 and L5-S1 and mild narrowing at L4-5.   L1-2: Mild disc bulging and mild facet and ligamentum flavum hypertrophy without stenosis.   L2-3: Mild disc bulging and mild facet and ligamentum flavum hypertrophy without stenosis.   L3-4: Circumferential disc bulging and mild-to-moderate  facet and ligamentum flavum hypertrophy result in mild to moderate left greater than right lateral recess stenosis and mild spinal stenosis without neural foraminal stenosis.   L4-5: Anterolisthesis with mild bulging of uncovered disc and moderate to severe facet and ligamentum flavum hypertrophy result in moderate spinal stenosis and mild bilateral lateral recess stenosis without neural foraminal stenosis. Small synovial cysts posterior to the facet joints bilaterally, not in a position to cause neural impingement.   L5-S1: Disc bulging, endplate spurring, severe disc space height loss, and mild facet hypertrophy result in mild bilateral lateral recess stenosis and mild left greater than right neural foraminal stenosis without spinal stenosis.   IMPRESSION: 1. Multilevel lumbar disc and facet degeneration, most notable at L4-5 where there is moderate spinal stenosis. 2. Mild-to-moderate lateral recess stenosis and mild spinal stenosis at L3-4. 3. Mild lateral recess and neural foraminal stenosis at L5-S1.     Electronically Signed   By: Sebastian Ache M.D.   On: 03/07/2020 17:12  Imaging: XR KNEE 3 VIEW LEFT  Result Date: 09/04/2022 X-rays demonstrate generalized osteopenia.  No acute or structural abnormalities.  XR KNEE 3 VIEW RIGHT  Result Date: 09/04/2022 X-rays demonstrate generalized osteopenia.  No acute or structural abnormalities.    PMFS History: Patient Active Problem List   Diagnosis Date Noted   Caregiver stress 09/10/2021   Vitamin B12 deficiency 02/01/2021   Chronic pain of right knee 02/01/2021   Status post total replacement of right hip 11/19/2020   Cigarette nicotine dependence without complication 08/30/2020   Chronic HFrEF (heart failure with reduced ejection fraction) (HCC) 08/19/2020   Aortic atherosclerosis (HCC) 05/01/2020   Chronic bilateral low back pain without sciatica 02/17/2020   Anxiety and depression 07/30/2017   CAD (coronary artery  disease) 07/30/2017   Insomnia 07/30/2017   Osteoporosis 02/27/2016   Seizures (HCC) 06/07/2015   Gastroesophageal reflux disease without esophagitis 06/07/2015   COPD (chronic obstructive pulmonary disease) with chronic bronchitis 08/14/2014   Essential hypertension 08/14/2014   Pure hypercholesterolemia 08/14/2014   Past Medical History:  Diagnosis Date   Allergy    Anxiety    agoraphobia    Arthritis    Chicken pox    COPD (chronic obstructive pulmonary disease) (HCC)    Depression    Family history of adverse reaction to anesthesia    PONV   GERD (gastroesophageal reflux disease)    Headache    History of blood transfusion    1984   Hyperlipidemia    Hypertension    Migraine    PONV (postoperative nausea and vomiting)    Seizure (HCC)    last seizure 04/2019    Family History  Problem Relation Age of Onset   Stroke Mother  Depression Mother    Arthritis Mother    Heart disease Father    Alcohol abuse Father    Diabetes Father    Osteoporosis Sister    Kidney disease Sister    Arthritis Sister    Dementia Sister    Arthritis Maternal Grandmother    Heart disease Maternal Grandmother    Hyperlipidemia Maternal Grandmother    Diabetes Paternal Grandmother    Breast cancer Neg Hx     Past Surgical History:  Procedure Laterality Date   APPENDECTOMY  1984   CATARACT EXTRACTION, BILATERAL Bilateral    COLONOSCOPY WITH PROPOFOL N/A 03/26/2017   Procedure: COLONOSCOPY WITH PROPOFOL;  Surgeon: Wyline Mood, MD;  Location: [facility] ENDOSCOPY;  Service: Gastroenterology;  Laterality: N/A;   TOTAL ABDOMINAL HYSTERECTOMY  1984   Total   TOTAL HIP ARTHROPLASTY Right 11/19/2020   Procedure: RIGHT TOTAL HIP ARTHROPLASTY ANTERIOR APPROACH;  Surgeon: Tarry Kos, MD;  Location: [facility] OR;  Service: Orthopedics;  Laterality: Right;  C-3   VENTRAL HERNIA REPAIR N/A 09/16/2019   Procedure: HERNIA REPAIR VENTRAL ADULT, Open;  Surgeon: Duanne Guess, MD;  Location: [facility] ORS;   Service: General;  Laterality: N/A;   WISDOM TOOTH EXTRACTION     Social History   Occupational History    Comment: disabled  Tobacco Use   Smoking status: Every Day    Current packs/day: 1.00    Average packs/day: 1 pack/day for 45.0 years (45.0 ttl pk-yrs)    Types: Cigarettes   Smokeless tobacco: Never   Tobacco comments:    reports quitting in feb 2021  Vaping Use   Vaping status: Never Used  Substance and Sexual Activity   Alcohol use: No   Drug use: Yes    Types: Marijuana    Comment: every day   Sexual activity: Never

## 2022-09-08 ENCOUNTER — Encounter: Payer: 59 | Admitting: Physical Therapy

## 2022-09-10 ENCOUNTER — Encounter: Payer: 59 | Admitting: Physical Therapy

## 2022-09-30 ENCOUNTER — Ambulatory Visit (INDEPENDENT_AMBULATORY_CARE_PROVIDER_SITE_OTHER): Payer: 59 | Admitting: Family Medicine

## 2022-09-30 ENCOUNTER — Telehealth: Payer: Self-pay

## 2022-09-30 ENCOUNTER — Ambulatory Visit: Payer: Self-pay | Admitting: *Deleted

## 2022-09-30 VITALS — BP 142/85 | HR 93 | Temp 98.4°F | Wt 112.2 lb

## 2022-09-30 DIAGNOSIS — M5416 Radiculopathy, lumbar region: Secondary | ICD-10-CM | POA: Diagnosis not present

## 2022-09-30 MED ORDER — KETOROLAC TROMETHAMINE 60 MG/2ML IM SOLN
30.0000 mg | Freq: Once | INTRAMUSCULAR | Status: AC
Start: 2022-09-30 — End: 2022-09-30
  Administered 2022-09-30: 30 mg via INTRAMUSCULAR

## 2022-09-30 MED ORDER — DICLOFENAC SODIUM 75 MG PO TBEC: 75.0000 mg | DELAYED_RELEASE_TABLET | Freq: Two times a day (BID) | ORAL | 0 refills | Status: DC

## 2022-09-30 NOTE — Progress Notes (Signed)
BP (!) 142/85   Pulse 93   Temp 98.4 F (36.9 C) (Oral)   Wt 112 lb 3.2 oz (50.9 kg)   SpO2 98%   BMI 18.67 kg/m    Subjective:    Patient ID: Kayla Shah, female    DOB: [birth year], 67 y.o.   MRN: [id number]  HPI: Kayla Shah is a 67 y.o. female  Chief Complaint  Patient presents with   Hip Pain    Right hip   HIP PAIN Patient has history of right total hip arthoplasty in 2022 and lumbar radiculopathy with improvements lasting for over a year after surgery. She complains her pain has gradually returned. Previous lumbar spine xray done in May 2024 showed Lower lumbar spondylosis and degenerative disc disease.   She recently visited ED twice this year for exacerbation of lumbar radiculopathy, the same pain she is experiencing today. In the ED she was treated with prednisone taper and oxycodone 5 MG Q8 hours for..........ED recommended follow up with orthopedic Patient has history of steroid back injections with [facility]. Visited ortho in 07/01/22 and was given 5 day course of Norco until able to get in with pain management, advised to return for injections. MRI done at this visit showing Multilevel lumbar disc and facet degeneration, most notable at L4-5 where there is moderate spinal stenosis, Mild-to-moderate lateral recess stenosis and mild spinal stenosis at L3-4, Mild lateral recess and neural foraminal stenosis at L5-S1.   She recently visited ortho month for back injection but states it relieved pain for 12 hours.   X-rays in October 2023 demonstrate interval collapse and fragmentation of the right  femoral head with secondary degenerative joint disease.  There has been  significant progression of the avascular necrosis of the femoral head. She had imaging of her L-spine which revealed significant degenerative disc disease and grade 1 anterior listhesis at L4-L5. Waking her up from sleep. Duration:5 months Involved Location: Right lower back with radiation  to right hip Mechanism of injury: unknown Location: right lateral Onset: gradual  Severity: 9/10  Quality: sharp, aching, sore, and tender Frequency: constant Radiation: yes down to  Aggravating factors: walking, stairs, bending, movement, and prolonged sitting   Alleviating factors:  Oxycodone    Status: worse Treatments attempted:  oxycodone and ice   Relief with NSAIDs?: no Weakness with weight bearing: Yes Weakness with walking: yes Paresthesias / decreased sensation: No Swelling: no Redness:no Fevers: no  She is unable to do straight leg testing or movement limited rotation of hip, using walker.   As leaving room overheard patient say "Ill just go to my drug dealer"   Relevant past medical, surgical, family and social history reviewed and updated as indicated. Interim medical history since our last visit reviewed. Allergies and medications reviewed and updated.  Review of Systems  Constitutional:  Negative for fever.  Genitourinary:  Negative for dysuria, flank pain and frequency.  Musculoskeletal:  Positive for arthralgias, back pain and gait problem. Negative for joint swelling, myalgias, neck pain and neck stiffness.  Skin:  Negative for color change.  Neurological:  Negative for numbness.    Per HPI unless specifically indicated above     Objective:    BP (!) 142/85   Pulse 93   Temp 98.4 F (36.9 C) (Oral)   Wt 112 lb 3.2 oz (50.9 kg)   SpO2 98%   BMI 18.67 kg/m   Wt Readings from Last 3 Encounters:  09/30/22 112 lb 3.2 oz (50.9 kg)  07/23/22 125 lb (56.7 kg)  06/27/22 125 lb (56.7 kg)    Physical Exam Vitals and nursing note reviewed.  Constitutional:      General: She is awake. She is not in acute distress.    Appearance: Normal appearance. She is well-developed and well-groomed. She is not ill-appearing.  HENT:     Head: Normocephalic and atraumatic.     Right Ear: Hearing and external ear normal. No drainage.     Left Ear: Hearing and external  ear normal. No drainage.     Nose: Nose normal.  Eyes:     General: Lids are normal.        Right eye: No discharge.        Left eye: No discharge.     Conjunctiva/sclera: Conjunctivae normal.  Cardiovascular:     Rate and Rhythm: Normal rate and regular rhythm.     Heart sounds: Normal heart sounds, S1 normal and S2 normal. No murmur heard.    No gallop.  Pulmonary:     Effort: Pulmonary effort is normal. No accessory muscle usage or respiratory distress.     Breath sounds: Normal breath sounds.  Musculoskeletal:        General: Normal range of motion.     Cervical back: Full passive range of motion without pain and normal range of motion.     Right lower leg: No edema.     Left lower leg: No edema.  Skin:    General: Skin is warm and dry.     Capillary Refill: Capillary refill takes less than 2 seconds.  Neurological:     Mental Status: She is alert and oriented to person, place, and time.  Psychiatric:        Attention and Perception: Attention normal.        Mood and Affect: Mood normal.        Speech: Speech normal.        Behavior: Behavior normal. Behavior is cooperative.        Thought Content: Thought content normal.     Results for orders placed or performed in visit on 10/15/21  Comprehensive metabolic panel  Result Value Ref Range   Glucose 95 70 - 99 mg/dL   BUN 16 8 - 27 mg/dL   Creatinine, Ser 1.61 (H) 0.57 - 1.00 mg/dL   eGFR 58 (L) >09 UE/AVW/0.98   BUN/Creatinine Ratio 15 12 - 28   Sodium 138 134 - 144 mmol/L   Potassium 4.0 3.5 - 5.2 mmol/L   Chloride 97 96 - 106 mmol/L   CO2 26 20 - 29 mmol/L   Calcium 9.6 8.7 - 10.3 mg/dL   Total Protein 7.1 6.0 - 8.5 g/dL   Albumin 4.9 3.9 - 4.9 g/dL   Globulin, Total 2.2 1.5 - 4.5 g/dL   Albumin/Globulin Ratio 2.2 1.2 - 2.2   Bilirubin Total 0.3 0.0 - 1.2 mg/dL   Alkaline Phosphatase 74 44 - 121 IU/L   AST 10 0 - 40 IU/L   ALT 8 0 - 32 IU/L      Assessment & Plan:   Problem List Items Addressed This  Visit   None Visit Diagnoses     Lumbar radiculopathy, chronic    -  Primary   Relevant Medications   ketorolac (TORADOL) injection 30 mg (Completed)   Other Relevant Orders   Ambulatory referral to Pain Clinic        Follow up plan: Return if symptoms worsen or fail to  improve.

## 2022-09-30 NOTE — Patient Instructions (Addendum)
Call ortho and schedule appointment with them for follow up as soon as available  Try heat and ice alternately  Try Voltaren tablets twice daily   Expect call for scheduling with pain clinic

## 2022-09-30 NOTE — Telephone Encounter (Signed)
  Chief Complaint: Patient was seen in office today for back pain and states she was not given Rx to help her with her pain. Patient states she developed a migraine as she was leaving and started vomiting in the parking lot.  Symptoms: migraine- not responding to medication- patient has taken her first dose of Maxalt and has used her Zofran- but they have not helped. She has reached out to neurology for assistance and will try a second dose when time- although she has never had to take a second dose and does not know if she can keep it down. Patient called because she is very upset about her back pain and feels she got no help- no medication to even get her to specialist appointment.  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 Patient advised care for migraine and ED if her home treatment does not help. Patient advised I would send message for PCP review. Patient states she is sorry she can only take certain medications due to her health history and she is not requesting pain medication for no reason-she does have diagnosed problem.

## 2022-09-30 NOTE — Telephone Encounter (Signed)
Reason for Disposition  [1] Vomiting AND [2] 2 or more times  (Exception: Similar to previous migraines.)  Answer Assessment - Initial Assessment Questions 1. LOCATION: "Where does it hurt?"      Migraine- vomiting 2. ONSET: "When did the headache start?" (Minutes, hours or days)      At end of visit patient started feeling headache coming on 3. PATTERN: "Does the pain come and go, or has it been constant since it started?"     Constant- worse 4. SEVERITY: "How bad is the pain?" and "What does it keep you from doing?"  (e.g., Scale 1-10; mild, moderate, or severe)   - MILD (1-3): doesn't interfere with normal activities    - MODERATE (4-7): interferes with normal activities or awakens from sleep    - SEVERE (8-10): excruciating pain, unable to do any normal activities        severe 5. RECURRENT SYMPTOM: "Have you ever had headaches before?" If Yes, ask: "When was the last time?" and "What happened that time?"      yes 6. CAUSE: "What do you think is causing the headache?"     Not sure 7. MIGRAINE: "Have you been diagnosed with migraine headaches?" If Yes, ask: "Is this headache similar?"      Yes- Maxalt used- not helping 8. HEAD INJURY: "Has there been any recent injury to the head?"      no 9. OTHER SYMPTOMS: "Do you have any other symptoms?" (fever, stiff neck, eye pain, sore throat, cold symptoms)     Vomiting  Protocols used: Headache-A-AH

## 2022-10-02 DIAGNOSIS — M5416 Radiculopathy, lumbar region: Secondary | ICD-10-CM | POA: Insufficient documentation

## 2022-10-02 NOTE — Telephone Encounter (Addendum)
Called patient to discuss concerns regarding office visit on 09/30/22. LMOM for patient to return my call.

## 2022-10-02 NOTE — Assessment & Plan Note (Addendum)
Chronic, ongoing. Toradol 30 MG given in office today for relief. Voltaren 75 MG ordered, recommend alternate with heat and ice to area, follow up with ortho for further pain management options such as back injections as previously had before. Referral placed for pain clinic.

## 2022-10-08 ENCOUNTER — Ambulatory Visit: Payer: Self-pay | Admitting: *Deleted

## 2022-10-08 NOTE — Telephone Encounter (Signed)
Routed to [facility]

## 2022-10-08 NOTE — Telephone Encounter (Signed)
Summary: requesting zofran for nausea   Patient called and stated when she is in pain, she gets very nauseated and can not eat. She is requesting ZOFRAN to be called in so she can eat. Patient stated she has tried  Dramamine and it just knocks her completely out. Patient stated she has lost weight and needs Zofran to help control her nausea so she can eat. Patient stated she is trying to avoid surgery right now, for her pain. She stated she has constant back pain but nothing severe right now.   [facility] callback # [phone]

## 2022-10-08 NOTE — Telephone Encounter (Signed)
  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 Patient advised due to class of this medication -she may need to have discussion with provider about better alternatives. I will send request and patient does have appointment   Reason for Disposition . Prescription request for new medicine (not a refill)  Answer Assessment - Initial Assessment Questions 1. NAME of MEDICINE: "What medicine(s) are you calling about?"     Request Rx Zofran- last Rx 07/14/22 2. QUESTION: "What is your question?" (e.g., double dose of medicine, side effect)     Patient is unable to eat due to nausea- unable to eat- weight loss- 112lb 3. PRESCRIBER: "Who prescribed the medicine?" Reason: if prescribed by specialist, call should be referred to that group.     Appointment- 11/12/22 4. 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 Patient has "spit-up" not vomiting. (Patient may need GI referral) Patient informed she may not be able to have this medication prescribed without seeing provider first due to class of the medication- she was unaware. Patient advised I would send request- she does have appointment 11/12/22 with PCP.  Protocols used: Medication Question Call-A-AH

## 2022-10-09 ENCOUNTER — Telehealth: Payer: Self-pay | Admitting: Family Medicine

## 2022-10-09 ENCOUNTER — Ambulatory Visit: Payer: 59 | Admitting: Orthopaedic Surgery

## 2022-10-09 NOTE — Telephone Encounter (Signed)
Leave for Dr. Johnson review. °

## 2022-10-09 NOTE — Telephone Encounter (Signed)
Copied from CRM [id number]. Topic: General - Inquiry >> Oct 09, 2022 10:15 AM Kayla Shah wrote: Reason for CRM: pt is wanting to transfer her care to [facility].  If that is ok then we need to have her call back and schedule her an appt at [facility].  CB@ [phone]

## 2022-10-09 NOTE — Telephone Encounter (Signed)
Called patient to schedule an appointment, she stated that she did not want to schedule an appointment as she is wanting to change providers.

## 2022-10-12 NOTE — Telephone Encounter (Signed)
I have never met this patient. I'm 100% fine with her transferring care and am a little confused why we need to sign off on this

## 2022-10-13 ENCOUNTER — Other Ambulatory Visit: Payer: Self-pay | Admitting: Physician Assistant

## 2022-10-13 ENCOUNTER — Telehealth: Payer: Self-pay

## 2022-10-13 NOTE — Telephone Encounter (Signed)
Patient called in and requested a referral to pain management. She would prefer one in [place], but [place] is okay. Please advise.

## 2022-10-13 NOTE — Telephone Encounter (Signed)
Medication Refill - Medication: ondansetron (ZOFRAN) 4 MG tablet   Has the patient contacted their pharmacy? No.  Preferred Pharmacy (with phone number or street name):  [facility] - [place], [place] - [address] Phone: [phone]  Fax: [phone]     Has the patient been seen for an appointment in the last year OR does the patient have an upcoming appointment? No.  Agent: Please be advised that RX refills may take up to 3 business days. We ask that you follow-up with your pharmacy.  Patient changed providers and her prev provider advised she would need to have her current provider fill her script. Patient will not be seen until 11/7.

## 2022-10-14 ENCOUNTER — Other Ambulatory Visit: Payer: Self-pay | Admitting: Physical Medicine and Rehabilitation

## 2022-10-14 DIAGNOSIS — G894 Chronic pain syndrome: Secondary | ICD-10-CM

## 2022-10-14 DIAGNOSIS — M5416 Radiculopathy, lumbar region: Secondary | ICD-10-CM

## 2022-10-14 DIAGNOSIS — M48062 Spinal stenosis, lumbar region with neurogenic claudication: Secondary | ICD-10-CM

## 2022-10-14 MED ORDER — ONDANSETRON HCL 4 MG PO TABS: 4.0000 mg | ORAL_TABLET | Freq: Three times a day (TID) | ORAL | 0 refills | Status: DC | PRN

## 2022-10-14 NOTE — Telephone Encounter (Signed)
Requested medications are due for refill today.  yes  Requested medications are on the active medications list.  yes  Last refill. 07/14/2022 #20 0 rf  Future visit scheduled.   yes  Notes to clinic.  Refill not delegated.    Requested Prescriptions  Pending Prescriptions Disp Refills   ondansetron (ZOFRAN) 4 MG tablet 20 tablet 0    Sig: Take 1 tablet (4 mg total) by mouth every 8 (eight) hours as needed for nausea or vomiting.     Not Delegated - Gastroenterology: Antiemetics - ondansetron Failed - 10/13/2022  9:31 AM      Failed - This refill cannot be delegated      Failed - AST in normal range and within 360 days    AST  Date Value Ref Range Status  10/15/2021 10 0 - 40 IU/L Final   SGOT(AST)  Date Value Ref Range Status  08/28/2013 23 15 - 37 Unit/L Final         Failed - ALT in normal range and within 360 days    ALT  Date Value Ref Range Status  10/15/2021 8 0 - 32 IU/L Final   SGPT (ALT)  Date Value Ref Range Status  08/28/2013 16 U/L Final    Comment:    14-63 NOTE: New Reference Range 08/23/13          Passed - Valid encounter within last 6 months    Recent Outpatient Visits           2 weeks ago Lumbar radiculopathy, chronic   [place] [facility] [place], Sherran Needs, NP   5 months ago Gastroesophageal reflux disease without esophagitis   [place] [facility] Gabriel Cirri, NP   12 months ago Essential hypertension   [place] [facility] Gabriel Cirri, NP   1 year ago Pure hypercholesterolemia   [place] [facility] Gabriel Cirri, NP   1 year ago Hot flashes due to surgical menopause   [place] [facility] Mecum, Oswaldo Conroy, PA-C       Future Appointments             In 4 weeks [place] Ben[place], Oralia Rud, DO [place] [facility], [facility]   In 1 month Ostwalt, [place], PA-C  [facility], [facility]

## 2022-10-17 ENCOUNTER — Other Ambulatory Visit: Payer: Self-pay

## 2022-10-17 MED ORDER — VALSARTAN 40 MG PO TABS: 40.0000 mg | ORAL_TABLET | Freq: Every day | ORAL | 0 refills | Status: DC

## 2022-10-21 DIAGNOSIS — Z131 Encounter for screening for diabetes mellitus: Secondary | ICD-10-CM | POA: Diagnosis not present

## 2022-10-21 DIAGNOSIS — M129 Arthropathy, unspecified: Secondary | ICD-10-CM | POA: Diagnosis not present

## 2022-10-21 DIAGNOSIS — E559 Vitamin D deficiency, unspecified: Secondary | ICD-10-CM | POA: Diagnosis not present

## 2022-10-21 DIAGNOSIS — R5383 Other fatigue: Secondary | ICD-10-CM | POA: Diagnosis not present

## 2022-10-21 DIAGNOSIS — M545 Low back pain, unspecified: Secondary | ICD-10-CM | POA: Diagnosis not present

## 2022-10-21 DIAGNOSIS — Z681 Body mass index (BMI) 19 or less, adult: Secondary | ICD-10-CM | POA: Diagnosis not present

## 2022-10-21 DIAGNOSIS — E7800 Pure hypercholesterolemia, unspecified: Secondary | ICD-10-CM | POA: Diagnosis not present

## 2022-10-21 DIAGNOSIS — M25552 Pain in left hip: Secondary | ICD-10-CM | POA: Diagnosis not present

## 2022-10-21 DIAGNOSIS — M79604 Pain in right leg: Secondary | ICD-10-CM | POA: Diagnosis not present

## 2022-10-21 DIAGNOSIS — E612 Magnesium deficiency: Secondary | ICD-10-CM | POA: Diagnosis not present

## 2022-10-21 DIAGNOSIS — Z1159 Encounter for screening for other viral diseases: Secondary | ICD-10-CM | POA: Diagnosis not present

## 2022-10-21 DIAGNOSIS — Z79899 Other long term (current) drug therapy: Secondary | ICD-10-CM | POA: Diagnosis not present

## 2022-10-21 DIAGNOSIS — M25551 Pain in right hip: Secondary | ICD-10-CM | POA: Diagnosis not present

## 2022-10-27 ENCOUNTER — Ambulatory Visit: Payer: 59 | Admitting: Family Medicine

## 2022-10-28 DIAGNOSIS — I10 Essential (primary) hypertension: Secondary | ICD-10-CM | POA: Diagnosis not present

## 2022-10-28 DIAGNOSIS — Z79899 Other long term (current) drug therapy: Secondary | ICD-10-CM | POA: Diagnosis not present

## 2022-10-28 DIAGNOSIS — M1990 Unspecified osteoarthritis, unspecified site: Secondary | ICD-10-CM | POA: Diagnosis not present

## 2022-10-28 DIAGNOSIS — Z681 Body mass index (BMI) 19 or less, adult: Secondary | ICD-10-CM | POA: Diagnosis not present

## 2022-10-28 DIAGNOSIS — R110 Nausea: Secondary | ICD-10-CM | POA: Diagnosis not present

## 2022-10-28 DIAGNOSIS — M545 Low back pain, unspecified: Secondary | ICD-10-CM | POA: Diagnosis not present

## 2022-10-28 DIAGNOSIS — M79604 Pain in right leg: Secondary | ICD-10-CM | POA: Diagnosis not present

## 2022-10-28 DIAGNOSIS — M25551 Pain in right hip: Secondary | ICD-10-CM | POA: Diagnosis not present

## 2022-10-28 DIAGNOSIS — M25552 Pain in left hip: Secondary | ICD-10-CM | POA: Diagnosis not present

## 2022-10-31 DIAGNOSIS — Z79899 Other long term (current) drug therapy: Secondary | ICD-10-CM | POA: Diagnosis not present

## 2022-11-04 ENCOUNTER — Other Ambulatory Visit: Payer: Self-pay | Admitting: Family Medicine

## 2022-11-04 NOTE — Telephone Encounter (Addendum)
Medication Refill - Medication:  simvastatin (ZOCOR) 40 MG tablet  *3 pills left   Patient states she has an upcoming NP appointment with [facility] on 12/11/2022, former patient of [facility] & the pharmacy is not refilling this medication and she doesn't want to run out of medication before NP appt.   Has the patient contacted their pharmacy? Yes, advised to contact PCP office  Preferred Pharmacy (with phone number or street name):  [facility] - [place], [place] - [address]  Phone: [phone] Fax: [phone]  Has the patient been seen for an appointment in the last year OR does the patient have an upcoming appointment? Last seen at [facility] on 09/30/22

## 2022-11-04 NOTE — Telephone Encounter (Signed)
Requested medication (s) are due for refill today: yes  Requested medication (s) are on the active medication list: yes yes Last refill:  09/10/21  Future visit scheduled: yes  Notes to clinic:  routing for approval until new patient OV at [facility].     Requested Prescriptions  Pending Prescriptions Disp Refills   simvastatin (ZOCOR) 40 MG tablet 90 tablet 3    Sig: Take 1 tablet (40 mg total) by mouth at bedtime.     Cardiovascular:  Antilipid - Statins Failed - 11/04/2022 10:00 AM      Failed - Lipid Panel in normal range within the last 12 months    Cholesterol, Total  Date Value Ref Range Status  02/01/2021 239 (H) 100 - 199 mg/dL Final   LDL Chol Calc (NIH)  Date Value Ref Range Status  02/01/2021 138 (H) 0 - 99 mg/dL Final   HDL  Date Value Ref Range Status  02/01/2021 81 >39 mg/dL Final   Triglycerides  Date Value Ref Range Status  02/01/2021 115 0 - 149 mg/dL Final         Passed - Patient is not pregnant      Passed - Valid encounter within last 12 months    Recent Outpatient Visits           1 month ago Lumbar radiculopathy, chronic   [place] [facility] [place], Sherran Needs, NP   6 months ago Gastroesophageal reflux disease without esophagitis   [place] [facility] Gabriel Cirri, NP   1 year ago Essential hypertension   [place] [facility] Gabriel Cirri, NP   1 year ago Pure hypercholesterolemia   [place] [facility] Gabriel Cirri, NP   1 year ago Hot flashes due to surgical menopause   [place] [facility] Mecum, Oswaldo Conroy, PA-C       Future Appointments             In 1 month Ostwalt, [place] Crape, PA-C [facility], [facility]

## 2022-11-05 ENCOUNTER — Ambulatory Visit: Payer: 59 | Admitting: Family Medicine

## 2022-11-06 MED ORDER — SIMVASTATIN 40 MG PO TABS: 40.0000 mg | ORAL_TABLET | Freq: Every day | ORAL | 3 refills | Status: DC

## 2022-11-12 ENCOUNTER — Encounter: Payer: 59 | Admitting: Family Medicine

## 2022-11-26 ENCOUNTER — Other Ambulatory Visit: Payer: Self-pay | Admitting: Family Medicine

## 2022-11-26 DIAGNOSIS — I10 Essential (primary) hypertension: Secondary | ICD-10-CM

## 2022-11-27 DIAGNOSIS — M25551 Pain in right hip: Secondary | ICD-10-CM | POA: Diagnosis not present

## 2022-11-27 DIAGNOSIS — M545 Low back pain, unspecified: Secondary | ICD-10-CM | POA: Diagnosis not present

## 2022-11-27 DIAGNOSIS — I10 Essential (primary) hypertension: Secondary | ICD-10-CM | POA: Diagnosis not present

## 2022-11-27 DIAGNOSIS — M79604 Pain in right leg: Secondary | ICD-10-CM | POA: Diagnosis not present

## 2022-11-27 DIAGNOSIS — M1990 Unspecified osteoarthritis, unspecified site: Secondary | ICD-10-CM | POA: Diagnosis not present

## 2022-11-27 DIAGNOSIS — R110 Nausea: Secondary | ICD-10-CM | POA: Diagnosis not present

## 2022-11-27 DIAGNOSIS — Z681 Body mass index (BMI) 19 or less, adult: Secondary | ICD-10-CM | POA: Diagnosis not present

## 2022-11-27 DIAGNOSIS — Z79899 Other long term (current) drug therapy: Secondary | ICD-10-CM | POA: Diagnosis not present

## 2022-11-27 DIAGNOSIS — M25552 Pain in left hip: Secondary | ICD-10-CM | POA: Diagnosis not present

## 2022-11-27 NOTE — Telephone Encounter (Signed)
Requested medication (s) are due for refill today: yes  Requested medication (s) are on the active medication list: yes  Last refill:  valsartan: 10/17/22 #30        verapamil: 08/12/22 #90  Future visit scheduled: yes  Notes to clinic:  both have overdue labs and valsartan was already prescribed with a 30 day courtesy refill   Requested Prescriptions  Pending Prescriptions Disp Refills   verapamil (CALAN-SR) 240 MG CR tablet [Pharmacy Med Name: VERAPAMIL HCL ER 240 MG TAB] 90 tablet 0    Sig: TAKE 1 TABLET BY MOUTH DAILY     Cardiovascular: Calcium Channel Blockers 3 Failed - 11/26/2022 10:49 AM      Failed - ALT in normal range and within 360 days    ALT  Date Value Ref Range Status  10/15/2021 8 0 - 32 IU/L Final   SGPT (ALT)  Date Value Ref Range Status  08/28/2013 16 U/L Final    Comment:    14-63 NOTE: New Reference Range 08/23/13          Failed - AST in normal range and within 360 days    AST  Date Value Ref Range Status  10/15/2021 10 0 - 40 IU/L Final   SGOT(AST)  Date Value Ref Range Status  08/28/2013 23 15 - 37 Unit/L Final         Failed - Cr in normal range and within 360 days    Creatinine  Date Value Ref Range Status  08/28/2013 1.27 0.60 - 1.30 mg/dL Final   Creatinine, Ser  Date Value Ref Range Status  10/15/2021 1.06 (H) 0.57 - 1.00 mg/dL Final         Failed - Last BP in normal range    BP Readings from Last 1 Encounters:  09/30/22 (!) 142/85         Passed - Last Heart Rate in normal range    Pulse Readings from Last 1 Encounters:  09/30/22 93         Passed - Valid encounter within last 6 months    Recent Outpatient Visits           1 month ago Lumbar radiculopathy, chronic   [place] [facility] [place], Sherran Needs, NP   7 months ago Gastroesophageal reflux disease without esophagitis   [place] [facility] Gabriel Cirri, NP   1 year ago Essential hypertension   [place] [facility] Gabriel Cirri, NP   1 year ago Pure hypercholesterolemia   [place] [facility] Gabriel Cirri, NP   1 year ago Hot flashes due to surgical menopause   [place] [facility] Mecum, Oswaldo Conroy, PA-C       Future Appointments             In 2 weeks Debera Lat, PA-C [place] [facility], [facility]             [name] 40 MG tablet [Pharmacy Med Name: VALSARTAN 40 MG TAB] 30 tablet 0    Sig: TAKE 1 TABLET BY MOUTH DAILY     Cardiovascular:  Angiotensin Receptor Blockers Failed - 11/26/2022 10:49 AM      Failed - Cr in normal range and within 180 days    Creatinine  Date Value Ref Range Status  08/28/2013 1.27 0.60 - 1.30 mg/dL Final   Creatinine, Ser  Date Value Ref Range Status  10/15/2021 1.06 (H) 0.57 - 1.00  mg/dL Final         Failed - K in normal range and within 180 days    Potassium  Date Value Ref Range Status  10/15/2021 4.0 3.5 - 5.2 mmol/L Final  08/28/2013 3.5 3.5 - 5.1 mmol/L Final         Failed - Last BP in normal range    BP Readings from Last 1 Encounters:  09/30/22 (!) 142/85         Passed - Patient is not pregnant      Passed - Valid encounter within last 6 months    Recent Outpatient Visits           1 month ago Lumbar radiculopathy, chronic   [place] [facility] [place], Sherran Needs, NP   7 months ago Gastroesophageal reflux disease without esophagitis   [place] [facility] Gabriel Cirri, NP   1 year ago Essential hypertension   [place] [facility] Gabriel Cirri, NP   1 year ago Pure hypercholesterolemia   [place] [facility] Gabriel Cirri, NP   1 year ago Hot flashes due to surgical menopause   [place] [facility] Mecum, Oswaldo Conroy, PA-C       Future Appointments             In 2 weeks Debera Lat, PA-C [facility], [facility]

## 2022-12-01 DIAGNOSIS — Z79899 Other long term (current) drug therapy: Secondary | ICD-10-CM | POA: Diagnosis not present

## 2022-12-01 NOTE — Telephone Encounter (Signed)
Needs a FOLLOW-UP appointment with PCP. Courtesy refills

## 2022-12-05 ENCOUNTER — Telehealth: Payer: Self-pay

## 2022-12-05 NOTE — Patient Outreach (Addendum)
Successful call to patient on today regarding preventative mammogram screening. Patient declined at this time and will follow up with new PCP at later this month.  Baruch Gouty [facility] Assistant [facility] Population Health [phone]

## 2022-12-07 NOTE — Progress Notes (Signed)
New patient visit  Patient: Kayla Shah [facility]   DOB: 1955-11-23   67 y.o. Female  MRN: [id number] Visit Date: 12/11/2022  Today's healthcare provider: Debera Lat, PA-C   Chief Complaint  Patient presents with   Establish Care   Subjective    Kayla Shah is a 67 y.o. female who presents today as a new patient to establish care.   Discussed the use of AI scribe software for clinical note transcription with the patient, who gave verbal consent to proceed.  History of Present Illness   The patient, with a history of hypertension, COPD, acid reflux, elevated cholesterol, seizures, anxiety, depression, insomnia, and lower back pain, reports being stable on her current regimen. She sees her primary care provider every six months and has regular appointments with a psychiatrist and a pain management doctor. The patient's seizures have been well-controlled for years with Lamictal. Despite acknowledging the health risks, the patient continues to smoke. She is on multiple medications including Lamictal, Maxalt, verapamil, valsartan, simvastatin, pantoprazole, and pain medication. The patient has been experiencing night sweats, which she attributes to hormonal changes. She has a family history of early hysterectomies and cervical cancer. The patient has a history of cataract surgery in one eye, which has resulted in discomfort and double vision.        Past Medical History:  Diagnosis Date   Allergy    Anxiety    agoraphobia    Arthritis    Cataract    Chicken pox    COPD (chronic obstructive pulmonary disease) (HCC)    Depression    Family history of adverse reaction to anesthesia    PONV   GERD (gastroesophageal reflux disease)    Headache    History of blood transfusion    1984   Hypertension    Migraine    Osteoporosis    PONV (postoperative nausea and vomiting)    Seizure (HCC)    last seizure 04/2019   Past Surgical History:  Procedure Laterality Date    APPENDECTOMY  1984   CATARACT EXTRACTION, BILATERAL Bilateral    COLONOSCOPY WITH PROPOFOL N/A 03/26/2017   Procedure: COLONOSCOPY WITH PROPOFOL;  Surgeon: Wyline Mood, MD;  Location: [facility] END[facility]COPY;  Service: Gastroenterology;  Laterality: N/A;   EYE SURGERY     HERNIA REPAIR     JOINT REPLACEMENT     TOTAL ABDOMINAL HYSTERECTOMY  1984   Total   TOTAL HIP ARTHROPLASTY Right 11/19/2020   Procedure: RIGHT TOTAL HIP ARTHROPLASTY ANTERIOR APPROACH;  Surgeon: Tarry Kos, MD;  Location: [facility] OR;  Service: Orthopedics;  Laterality: Right;  C-3   VENTRAL HERNIA REPAIR N/A 09/16/2019   Procedure: HERNIA REPAIR VENTRAL ADULT, Open;  Surgeon: Duanne Guess, MD;  Location: [facility] ORS;  Service: General;  Laterality: N/A;   WISDOM TOOTH EXTRACTION     Family Status  Relation Name Status   Mother  Deceased   Father  Deceased   Sister  Alive       health declining    MGM  Deceased   MGF  Deceased   PGM  Deceased   PGF  Deceased   Neg Hx  (Not Specified)  No partnership data on file   Family History  Problem Relation Age of Onset   Stroke Mother    Depression Mother    Arthritis Mother    Heart disease Father    Alcohol abuse Father    Diabetes Father    Osteoporosis Sister    Kidney  disease Sister    Arthritis Sister    Dementia Sister    Arthritis Maternal Grandmother    Heart disease Maternal Grandmother    Hyperlipidemia Maternal Grandmother    Diabetes Paternal Grandmother    Breast cancer Neg Hx    Social History   Socioeconomic History   Marital status: Divorced    Spouse name: Casimiro Needle   Number of children: 0   Years of education: 12   Highest education level: Not on file  Occupational History    Comment: disabled  Tobacco Use   Smoking status: Every Day    Current packs/day: 1.00    Average packs/day: 1 pack/day for 45.0 years (45.0 ttl pk-yrs)    Types: Cigarettes   Smokeless tobacco: Never   Tobacco comments:    reports quitting in feb 2021  Vaping Use    Vaping status: Never Used  Substance and Sexual Activity   Alcohol use: No   Drug use: Yes    Types: Marijuana    Comment: every day   Sexual activity: Never  Other Topics Concern   Not on file  Social History Narrative   Disabled    Married no kids    No guns, wears seat belt, feels safe in relationship    12th grade ed.    Social Determinants of Health   Financial Resource Strain: Medium Risk (09/17/2021)   Overall Financial Resource Strain (CARDIA)    Difficulty of Paying Living Expenses: Somewhat hard  Food Insecurity: Unknown (09/17/2021)   Hunger Vital Sign    Worried About Running Out of Food in the Last Year: Not on file    Ran Out of Food in the Last Year: Never true  Transportation Needs: No Transportation Needs (02/18/2021)   PRAPARE - Administrator, Civil Service (Medical): No    Lack of Transportation (Non-Medical): No  Physical Activity: Sufficiently Active (02/18/2021)   Exercise Vital Sign    Days of Exercise per Week: 4 days    Minutes of Exercise per Session: 40 min  Stress: Stress Concern Present (09/17/2021)   [employer] of Occupational Health - Occupational Stress Questionnaire    Feeling of Stress : Rather much  Social Connections: Socially Isolated (02/18/2021)   Social Connection and Isolation Panel [NHANES]    Frequency of Communication with Friends and Family: More than three times a week    Frequency of Social Gatherings with Friends and Family: Three times a week    Attends Religious Services: Never    Active Member of Clubs or Organizations: No    Attends Banker Meetings: Never    Marital Status: Divorced   Outpatient Medications Prior to Visit  Medication Sig Note   diclofenac (VOLTAREN) 75 MG EC tablet Take 1 tablet (75 mg total) by mouth 2 (two) times daily.    simvastatin (ZOCOR) 40 MG tablet Take 1 tablet (40 mg total) by mouth at bedtime.    [name] 40 MG tablet TAKE 1 TABLET BY MOUTH DAILY     verapamil (CALAN-SR) 240 MG CR tablet TAKE 1 TABLET BY MOUTH DAILY    vitamin B-12 (CYANOCOBALAMIN) 500 MCG tablet Take 500 mcg by mouth daily.    acetaminophen-codeine (TYLENOL #3) 300-30 MG tablet Take 1 tablet by mouth every 6 (six) hours as needed for moderate pain. (Patient not taking: Reported on 12/11/2022) 12/11/2022: Half at night    diazepam (VALIUM) 5 MG tablet Take 5 mg by mouth 3 (three) times daily  as needed. (Patient not taking: Reported on 12/11/2022)    gabapentin (NEURONTIN) 100 MG capsule Take 1 capsule (100 mg total) by mouth 3 (three) times daily. (Patient not taking: Reported on 09/30/2022)    lamoTRIgine (LAMICTAL) 100 MG tablet TAKE 1 TABLET BY MOUTH TWICE A DAY (Patient not taking: Reported on 12/11/2022)    ondansetron (ZOFRAN) 4 MG tablet Take 1 tablet (4 mg total) by mouth every 8 (eight) hours as needed for nausea or vomiting. 12/11/2022: As needed   oxyCODONE (OXY IR/ROXICODONE) 5 MG immediate release tablet Take 5 mg by mouth every 8 (eight) hours as needed for severe pain. (Patient not taking: Reported on 09/30/2022)    pantoprazole (PROTONIX) 40 MG tablet Take 1 tablet (40 mg total) by mouth daily. (Patient not taking: Reported on 12/11/2022)    predniSONE (STERAPRED UNI-PAK 21 TAB) 10 MG (21) TBPK tablet Take 6 tablets on the first day and decrease by 1 tablet each day until finished. (Patient not taking: Reported on 09/30/2022)    predniSONE (STERAPRED UNI-PAK 21 TAB) 10 MG (21) TBPK tablet Take 6 tablets by mouth on day 1 and decrease by 1 tablet for each subsequent day (Patient not taking: Reported on 09/30/2022)    promethazine (PHENERGAN) 25 MG tablet Take 1 tablet (25 mg) one hour prior to procedure. (Patient not taking: Reported on 09/30/2022)    rizatriptan (MAXALT-MLT) 10 MG disintegrating tablet Take 1 tablet (10 mg total) by mouth as needed for migraine. May repeat in 2 hours if needed (Patient not taking: Reported on 09/30/2022)    No facility-administered medications  prior to visit.   Allergies  Allergen Reactions   Codeine Nausea And Vomiting   Other     No Anti Depressants - "makes me crazy"   Fluoxetine Anxiety   Paroxetine Anxiety    Becomes very aggressive with mood swings.     There is no immunization history on file for this patient.  Health Maintenance  Topic Date Due   DTaP/Tdap/Td (1 - Tdap) Never done   Zoster Vaccines- Shingrix (1 of 2) Never done   DEXA SCAN  02/26/2018   Lung Cancer Screening  10/29/2018   MAMMOGRAM  12/19/2021   Medicare Annual Wellness (AWV)  02/18/2022   INFLUENZA VACCINE  Never done   Pneumonia Vaccine 47+ Years old (1 of 2 - PCV) 04/28/2023 (Originally 06/20/1961)   Colonoscopy  04/28/2023 (Originally 03/26/2022)   Hepatitis C Screening  Completed   HPV VACCINES  Aged Out   COVID-19 Vaccine  Discontinued    Patient Care Team: Debera Lat, PA-C as PCP - General (Physician Assistant)  Review of Systems  All other systems reviewed and are negative.  Except see HPI       Objective    BP 103/63 (BP Location: Left Arm, Patient Position: Sitting, Cuff Size: Normal)   Pulse 73   Temp 98.3 F (36.8 C)   Resp 16   Ht 5\' 4"  (1.626 m)   Wt 118 lb (53.5 kg)   SpO2 96%   BMI 20.25 kg/m     Physical Exam Vitals reviewed.  Constitutional:      General: She is not in acute distress.    Appearance: Normal appearance. She is well-developed. She is not diaphoretic.  HENT:     Head: Normocephalic and atraumatic.  Eyes:     General: No scleral icterus.    Conjunctiva/sclera: Conjunctivae normal.  Neck:     Thyroid: No thyromegaly.  Cardiovascular:  Rate and Rhythm: Normal rate and regular rhythm.     Pulses: Normal pulses.     Heart sounds: Normal heart sounds. No murmur heard. Pulmonary:     Effort: Pulmonary effort is normal. No respiratory distress.     Breath sounds: Normal breath sounds. No wheezing, rhonchi or rales.  Musculoskeletal:     Cervical back: Neck supple.     Right  lower leg: No edema.     Left lower leg: No edema.  Lymphadenopathy:     Cervical: No cervical adenopathy.  Skin:    General: Skin is warm and dry.     Findings: No rash.  Neurological:     Mental Status: She is alert and oriented to person, place, and time. Mental status is at baseline.  Psychiatric:        Mood and Affect: Mood normal.        Behavior: Behavior normal.     Depression Screen    12/11/2022    1:50 PM 09/30/2022    9:32 AM 04/22/2022    8:18 AM 10/15/2021    9:17 AM  PHQ 2/9 Scores  PHQ - 2 Score 0 2 0 1  PHQ- 9 Score  6 0 1   No results found for any visits on 12/11/22.  Assessment & Plan     1. Gastroesophageal reflux disease without esophagitis  Stable on Pantoprazole. -Continue current medication.  2. Seizures (HCC) chronic Stable on Lamictal, no recent seizures reported. -Continue current medication. - Comprehensive metabolic panel - TSH Will follow-up  3. Essential (primary) hypertension chronic Stable on Verapamil and Valsartan. -Continue current medications. - CBC with Differential/Platelet - Comprehensive metabolic panel - Lipid panel - Vitamin B12 - TSH - Hemoglobin A1c Will follow-up  4. Cigarette nicotine dependence without complication Smoking cessation was addressed  Will reassess at the next appointment  Lung Cancer Screening Discussed need for annual CT scan due to smoking history. -Order annual CT scan for lung cancer screening.  5. Anxiety and depression Chronic and stable Managed with Valium as needed. Patient reports occasional sad days but no suicidal ideation. -Continue current management.  6. Aortic atherosclerosis (HCC) 7. Chronic HFrEF (heart failure with reduced ejection fraction) (HCC) Chronic and stable ordered - CBC with Differential/Platelet - Comprehensive metabolic panel - Lipid panel - Vitamin B12 - TSH - Hemoglobin A1c Will Follow-up   Will Follow-up At her next  appointment: Hyperlipidemia Stable on Simvastatin. -Continue current medication.  Migraines No recent episodes, previously on Maxalt. -Continue current management.  Chronic Obstructive Pulmonary Disease Patient continues to smoke despite understanding the risks. No current symptoms of cough or shortness of breath. -Discussed smoking cessation resources and strategies.  Insomnia Not currently on medication. -No changes to management at this time.  Lower Back Pain Managed by pain management specialist. -Continue current management.  Osteopenia/Osteoporosis Discussed need for DEXA scan. -Order DEXA scan.  Breast Cancer Screening Discussed need for mammogram. -Order mammogram.  Vaccinations Discussed need for tetanus and shingles vaccines. -Order tetanus and shingles vaccines.  Eye Health Patient reports issues following cataract surgery. -Refer to ophthalmologist for evaluation.  General Health Maintenance -Order comprehensive blood work. -Schedule follow-up appointment in six weeks.      Encounter to establish care Welcomed to our clinic Reviewed past medical hx, social hx, family hx and surgical hx Pt advised to send all vaccination records or screening   Return in about 6 weeks (around 01/22/2023) for chronic disease f/u, AW with RN.  The patient was advised to call back or seek an in-person evaluation if the symptoms worsen or if the condition fails to improve as anticipated.  I discussed the assessment and treatment plan with the patient. The patient was provided an opportunity to ask questions and all were answered. The patient agreed with the plan and demonstrated an understanding of the instructions.  I, Debera Lat, PA-C have reviewed all documentation for this visit. The documentation on  @11 /7/24  for the exam, diagnosis, procedures, and orders are all accurate and complete.  Debera Lat, [facility], MMS [facility] [phone]  (phone) [phone] (fax)  [facility]

## 2022-12-11 ENCOUNTER — Ambulatory Visit: Payer: 59 | Admitting: Physician Assistant

## 2022-12-11 ENCOUNTER — Encounter: Payer: Self-pay | Admitting: Physician Assistant

## 2022-12-11 VITALS — BP 103/63 | HR 73 | Temp 98.3°F | Resp 16 | Ht 64.0 in | Wt 118.0 lb

## 2022-12-11 DIAGNOSIS — F17210 Nicotine dependence, cigarettes, uncomplicated: Secondary | ICD-10-CM

## 2022-12-11 DIAGNOSIS — I5022 Chronic systolic (congestive) heart failure: Secondary | ICD-10-CM | POA: Diagnosis not present

## 2022-12-11 DIAGNOSIS — F32A Depression, unspecified: Secondary | ICD-10-CM

## 2022-12-11 DIAGNOSIS — F419 Anxiety disorder, unspecified: Secondary | ICD-10-CM

## 2022-12-11 DIAGNOSIS — Z13820 Encounter for screening for osteoporosis: Secondary | ICD-10-CM

## 2022-12-11 DIAGNOSIS — Z7689 Persons encountering health services in other specified circumstances: Secondary | ICD-10-CM

## 2022-12-11 DIAGNOSIS — R569 Unspecified convulsions: Secondary | ICD-10-CM | POA: Diagnosis not present

## 2022-12-11 DIAGNOSIS — K219 Gastro-esophageal reflux disease without esophagitis: Secondary | ICD-10-CM

## 2022-12-11 DIAGNOSIS — Z0000 Encounter for general adult medical examination without abnormal findings: Secondary | ICD-10-CM

## 2022-12-11 DIAGNOSIS — I10 Essential (primary) hypertension: Secondary | ICD-10-CM | POA: Diagnosis not present

## 2022-12-11 DIAGNOSIS — E7800 Pure hypercholesterolemia, unspecified: Secondary | ICD-10-CM | POA: Diagnosis not present

## 2022-12-11 DIAGNOSIS — I700 Atherosclerosis of aorta: Secondary | ICD-10-CM

## 2022-12-11 DIAGNOSIS — Z1231 Encounter for screening mammogram for malignant neoplasm of breast: Secondary | ICD-10-CM | POA: Diagnosis not present

## 2022-12-12 ENCOUNTER — Encounter: Payer: Self-pay | Admitting: Physician Assistant

## 2022-12-12 LAB — HEMOGLOBIN A1C
Est. average glucose Bld gHb Est-mCnc: 105 mg/dL
Hgb A1c MFr Bld: 5.3 % (ref 4.8–5.6)

## 2022-12-12 LAB — COMPREHENSIVE METABOLIC PANEL
ALT: 7 [IU]/L (ref 0–32)
AST: 11 [IU]/L (ref 0–40)
Albumin: 4.1 g/dL (ref 3.9–4.9)
Alkaline Phosphatase: 70 [IU]/L (ref 44–121)
BUN/Creatinine Ratio: 11 — ABNORMAL LOW (ref 12–28)
BUN: 11 mg/dL (ref 8–27)
Bilirubin Total: <0.2 mg/dL (ref 0.0–1.2)
CO2: 27 mmol/L (ref 20–29)
Calcium: 9.1 mg/dL (ref 8.7–10.3)
Chloride: 99 mmol/L (ref 96–106)
Creatinine, Ser: 1.03 mg/dL — ABNORMAL HIGH (ref 0.57–1.00)
Globulin, Total: 2.3 g/dL (ref 1.5–4.5)
Glucose: 88 mg/dL (ref 70–99)
Potassium: 4 mmol/L (ref 3.5–5.2)
Sodium: 140 mmol/L (ref 134–144)
Total Protein: 6.4 g/dL (ref 6.0–8.5)
eGFR: 60 mL/min/{1.73_m2} (ref >59)

## 2022-12-12 LAB — CBC WITH DIFFERENTIAL/PLATELET
Basophils Absolute: 0.1 10*3/uL (ref 0.0–0.2)
Basos: 1 %
EOS (ABSOLUTE): 0.2 10*3/uL (ref 0.0–0.4)
Eos: 3 %
Hematocrit: 38 % (ref 34.0–46.6)
Hemoglobin: 12.5 g/dL (ref 11.1–15.9)
Immature Grans (Abs): 0 10*3/uL (ref 0.0–0.1)
Immature Granulocytes: 0 %
Lymphocytes Absolute: 2.6 10*3/uL (ref 0.7–3.1)
Lymphs: 43 %
MCH: 33.2 pg — ABNORMAL HIGH (ref 26.6–33.0)
MCHC: 32.9 g/dL (ref 31.5–35.7)
MCV: 101 fL — ABNORMAL HIGH (ref 79–97)
Monocytes Absolute: 0.5 10*3/uL (ref 0.1–0.9)
Monocytes: 8 %
Neutrophils Absolute: 2.8 10*3/uL (ref 1.4–7.0)
Neutrophils: 45 %
Platelets: 231 10*3/uL (ref 150–450)
RBC: 3.77 x10E6/uL (ref 3.77–5.28)
RDW: 11.7 % (ref 11.7–15.4)
WBC: 6 10*3/uL (ref 3.4–10.8)

## 2022-12-12 LAB — LIPID PANEL
Chol/HDL Ratio: 3 ratio (ref 0.0–4.4)
Cholesterol, Total: 181 mg/dL (ref 100–199)
HDL: 61 mg/dL (ref >39)
LDL Chol Calc (NIH): 77 mg/dL (ref 0–99)
Triglycerides: 267 mg/dL — ABNORMAL HIGH (ref 0–149)
VLDL Cholesterol Cal: 43 mg/dL — ABNORMAL HIGH (ref 5–40)

## 2022-12-12 LAB — VITAMIN B12: Vitamin B-12: 1807 pg/mL — ABNORMAL HIGH (ref 232–1245)

## 2022-12-12 LAB — TSH: TSH: 1.8 u[IU]/mL (ref 0.450–4.500)

## 2022-12-12 MED ORDER — SIMVASTATIN 80 MG PO TABS: 80.0000 mg | ORAL_TABLET | Freq: Every day | ORAL | 0 refills | Status: DC

## 2022-12-12 NOTE — Progress Notes (Signed)
All labs are stable for you with some variations. We could discuss them in details during your next appointment/ The 10-year ASCVD risk score (risk for Heart attack and stroke) is: 8.9% Continue to take simvastatin but we might need to increase the dose if you will agree.

## 2022-12-15 ENCOUNTER — Other Ambulatory Visit: Payer: Self-pay

## 2022-12-15 MED ORDER — LAMOTRIGINE 100 MG PO TABS: 100.0000 mg | ORAL_TABLET | Freq: Two times a day (BID) | ORAL | 0 refills | Status: DC

## 2022-12-25 DIAGNOSIS — M545 Low back pain, unspecified: Secondary | ICD-10-CM | POA: Diagnosis not present

## 2022-12-25 DIAGNOSIS — Z79899 Other long term (current) drug therapy: Secondary | ICD-10-CM | POA: Diagnosis not present

## 2022-12-25 DIAGNOSIS — M79604 Pain in right leg: Secondary | ICD-10-CM | POA: Diagnosis not present

## 2022-12-25 DIAGNOSIS — Z681 Body mass index (BMI) 19 or less, adult: Secondary | ICD-10-CM | POA: Diagnosis not present

## 2022-12-25 DIAGNOSIS — M25551 Pain in right hip: Secondary | ICD-10-CM | POA: Diagnosis not present

## 2022-12-25 DIAGNOSIS — M25552 Pain in left hip: Secondary | ICD-10-CM | POA: Diagnosis not present

## 2022-12-25 DIAGNOSIS — M1990 Unspecified osteoarthritis, unspecified site: Secondary | ICD-10-CM | POA: Diagnosis not present

## 2022-12-29 DIAGNOSIS — Z79899 Other long term (current) drug therapy: Secondary | ICD-10-CM | POA: Diagnosis not present

## 2022-12-31 ENCOUNTER — Other Ambulatory Visit: Payer: Self-pay | Admitting: Physician Assistant

## 2022-12-31 NOTE — Telephone Encounter (Signed)
Requested medication (s) are due for refill today - no  Requested medication (s) are on the active medication list -no  Future visit scheduled -yes  Last refill: 07/10/21  Notes to clinic: medication not listed on current medication list  Requested Prescriptions  Pending Prescriptions Disp Refills   [name] 1.25 MG tablet [Pharmacy Med Name: [name] 1.25 MG TAB] 90 tablet     Sig: TAKE 1 TABLET BY MOUTH DAILY     OB/GYN:  Estrogens Failed - 12/31/2022 11:25 AM      Failed - Mammogram is up-to-date per Health Maintenance      Passed - Last BP in normal range    BP Readings from Last 1 Encounters:  12/11/22 103/63         Passed - Valid encounter within last 12 months    Recent Outpatient Visits           2 weeks ago Essential (primary) hypertension   [place] [facility] [place], [place], PA-C   3 months ago Lumbar radiculopathy, chronic   [place] [facility] Pearley, Sherran Needs, NP   8 months ago Gastroesophageal reflux disease without esophagitis   [place] [facility] Gabriel Cirri, NP   1 year ago Essential hypertension   [place] [facility] Gabriel Cirri, NP   1 year ago Pure hypercholesterolemia   [place] [facility] Gabriel Cirri, NP       Future Appointments             In 1 week Debera Lat, PA-C [place] [facility], [facility]            Signed Prescriptions Disp Refills   [name] 40 MG tablet 30 tablet 0    Sig: TAKE 1 TABLET BY MOUTH DAILY     Cardiovascular:  Angiotensin Receptor Blockers Failed - 12/31/2022 11:25 AM      Failed - Cr in normal range and within 180 days    Creatinine  Date Value Ref Range Status  08/28/2013 1.27 0.60 - 1.30 mg/dL Final   Creatinine, Ser  Date Value Ref Range Status  12/11/2022 1.03 (H) 0.57 - 1.00 mg/dL Final         Passed - K in normal range and within 180 days    Potassium  Date Value  Ref Range Status  12/11/2022 4.0 3.5 - 5.2 mmol/L Final  08/28/2013 3.5 3.5 - 5.1 mmol/L Final         Passed - Patient is not pregnant      Passed - Last BP in normal range    BP Readings from Last 1 Encounters:  12/11/22 103/63         Passed - Valid encounter within last 6 months    Recent Outpatient Visits           2 weeks ago Essential (primary) hypertension   [place] [facility] [place], [place], PA-C   3 months ago Lumbar radiculopathy, chronic   [place] [facility] [place], Sherran Needs, NP   8 months ago Gastroesophageal reflux disease without esophagitis   [place] [facility] Gabriel Cirri, NP   1 year ago Essential hypertension    [facility] Gabriel Cirri, NP   1 year ago Pure hypercholesterolemia    [facility] Gabriel Cirri, NP       Future Appointments  In 1 week Ostwalt, Janna, PA-C [place] [facility], [facility]               Requested Prescriptions  Pending Prescriptions Disp Refills   [name] 1.25 MG tablet [Pharmacy Med Name: [name] 1.25 MG TAB] 90 tablet     Sig: TAKE 1 TABLET BY MOUTH DAILY     OB/GYN:  Estrogens Failed - 12/31/2022 11:25 AM      Failed - Mammogram is up-to-date per Health Maintenance      Passed - Last BP in normal range    BP Readings from Last 1 Encounters:  12/11/22 103/63         Passed - Valid encounter within last 12 months    Recent Outpatient Visits           2 weeks ago Essential (primary) hypertension   [place] [facility] [place], [place], PA-C   3 months ago Lumbar radiculopathy, chronic   [place] [facility] Pearley, Sherran Needs, NP   8 months ago Gastroesophageal reflux disease without esophagitis   [place] [facility] Gabriel Cirri, NP   1 year ago Essential hypertension   [place] [facility]  Gabriel Cirri, NP   1 year ago Pure hypercholesterolemia   [place] [facility] Gabriel Cirri, NP       Future Appointments             In 1 week Debera Lat, PA-C [place] [facility], [facility]            Signed Prescriptions Disp Refills   [name] 40 MG tablet 30 tablet 0    Sig: TAKE 1 TABLET BY MOUTH DAILY     Cardiovascular:  Angiotensin Receptor Blockers Failed - 12/31/2022 11:25 AM      Failed - Cr in normal range and within 180 days    Creatinine  Date Value Ref Range Status  08/28/2013 1.27 0.60 - 1.30 mg/dL Final   Creatinine, Ser  Date Value Ref Range Status  12/11/2022 1.03 (H) 0.57 - 1.00 mg/dL Final         Passed - K in normal range and within 180 days    Potassium  Date Value Ref Range Status  12/11/2022 4.0 3.5 - 5.2 mmol/L Final  08/28/2013 3.5 3.5 - 5.1 mmol/L Final         Passed - Patient is not pregnant      Passed - Last BP in normal range    BP Readings from Last 1 Encounters:  12/11/22 103/63         Passed - Valid encounter within last 6 months    Recent Outpatient Visits           2 weeks ago Essential (primary) hypertension   [place] [facility] [place], [place], PA-C   3 months ago Lumbar radiculopathy, chronic   [place] [facility] [place], Sherran Needs, NP   8 months ago Gastroesophageal reflux disease without esophagitis   [place] [facility] Gabriel Cirri, NP   1 year ago Essential hypertension   [place] [facility] Gabriel Cirri, NP   1 year ago Pure hypercholesterolemia   [place] [facility] Gabriel Cirri, NP       Future Appointments             In 1 week Debera Lat, PA-C [facility], [facility]

## 2022-12-31 NOTE — Telephone Encounter (Signed)
Requested Prescriptions  Pending Prescriptions Disp Refills   [name] 1.25 MG tablet [Pharmacy Med Name: [name] 1.25 MG TAB] 90 tablet     Sig: TAKE 1 TABLET BY MOUTH DAILY     OB/GYN:  Estrogens Failed - 12/31/2022 11:25 AM      Failed - Mammogram is up-to-date per Health Maintenance      Passed - Last BP in normal range    BP Readings from Last 1 Encounters:  12/11/22 103/63         Passed - Valid encounter within last 12 months    Recent Outpatient Visits           2 weeks ago Essential (primary) hypertension   [place] [facility] [place], [place], PA-C   3 months ago Lumbar radiculopathy, chronic   [place] [facility] Pearley, Sherran Needs, NP   8 months ago Gastroesophageal reflux disease without esophagitis   [place] [facility] Gabriel Cirri, NP   1 year ago Essential hypertension   [place] [facility] Gabriel Cirri, NP   1 year ago Pure hypercholesterolemia   [place] [facility] Gabriel Cirri, NP       Future Appointments             In 1 week Ostwalt, Edmon [place], PA-C [place] [facility]             [name] 40 MG tablet [Pharmacy Med Name: VALSARTAN 40 MG TAB] 30 tablet 0    Sig: TAKE 1 TABLET BY MOUTH DAILY     Cardiovascular:  Angiotensin Receptor Blockers Failed - 12/31/2022 11:25 AM      Failed - Cr in normal range and within 180 days    Creatinine  Date Value Ref Range Status  08/28/2013 1.27 0.60 - 1.30 mg/dL Final   Creatinine, Ser  Date Value Ref Range Status  12/11/2022 1.03 (H) 0.57 - 1.00 mg/dL Final         Passed - K in normal range and within 180 days    Potassium  Date Value Ref Range Status  12/11/2022 4.0 3.5 - 5.2 mmol/L Final  08/28/2013 3.5 3.5 - 5.1 mmol/L Final         Passed - Patient is not pregnant      Passed - Last BP in normal range    BP Readings from Last 1 Encounters:  12/11/22 103/63          Passed - Valid encounter within last 6 months    Recent Outpatient Visits           2 weeks ago Essential (primary) hypertension   [place] [facility] [place], [place], PA-C   3 months ago Lumbar radiculopathy, chronic   [place] [facility] [place], Sherran Needs, NP   8 months ago Gastroesophageal reflux disease without esophagitis   [place] [facility] Gabriel Cirri, NP   1 year ago Essential hypertension   [place] [facility] Gabriel Cirri, NP   1 year ago Pure hypercholesterolemia   [place] [facility] Gabriel Cirri, NP       Future Appointments             In 1 week Debera Lat, PA-C [facility], [facility]

## 2023-01-11 NOTE — Progress Notes (Unsigned)
Established patient visit  Patient: Kayla Shah [facility]   DOB: 1955/09/16   67 y.o. Female  MRN: [id number] Visit Date: 01/13/2023  Today's healthcare provider: Debera Lat, PA-C   Chief Complaint  Patient presents with   Immunizations    Tetanus Vaccine- Shingles Vaccine-    Medical Management of Chronic Issues   Subjective      Discussed the use of AI scribe software for clinical note transcription with the patient, who gave verbal consent to proceed.  History of Present Illness   The patient, with a history of osteoporosis, COPD due to smoking, and seizures, presents for a routine follow-up. The patient has been managing her chronic conditions with prescribed medications and reports no new symptoms. She has a history of a hysterectomy and has been taking [name] for hot flashes, which she describes as severe. She reports still smoking about half a pack of cigarettes a day. 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 She had to cancel her annual wellness visit due to her sister's health issues. The patient expresses a desire to avoid surgery for her chronic conditions and prefers to manage her symptoms with medication. She also mentions that she has been advised to take vitamin D and calcium for her osteoporosis.           12/11/2022    1:50 PM 09/30/2022    9:32 AM 04/22/2022    8:18 AM  Depression screen PHQ 2/9  Decreased Interest 0 1 0  Down, Depressed, Hopeless 0 1 0  PHQ - 2 Score 0 2 0  Altered sleeping  1 0  Tired, decreased energy  0 0  Change in appetite  1 0  Feeling bad or failure about yourself   1 0  Trouble concentrating  0 0  Moving slowly or fidgety/restless  1 0  Suicidal thoughts  0 0  PHQ-9 Score  6 0  Difficult doing work/chores  Very difficult Not difficult at all      09/30/2022    9:33 AM 04/22/2022    8:18 AM 10/15/2021     9:17 AM 09/10/2021    8:24 AM  GAD 7 : Generalized Anxiety Score  Nervous, Anxious, on Edge 1 0 0 1  Control/stop worrying 1 0 0 1  Worry too much - different things 1 0 0 1  Trouble relaxing 3 0 0 1  Restless 0 0 0 0  Easily annoyed or irritable 0 0 0 1  Afraid - awful might happen 0 0 0 0  Total GAD 7 Score 6 0 0 5  Anxiety Difficulty Somewhat difficult Not difficult at all Not difficult at all Not difficult at all    Medications: Outpatient Medications Prior to Visit  Medication Sig   diazepam (VALIUM) 5 MG tablet Take 5 mg by mouth 3 (three) times daily as needed.   diclofenac (VOLTAREN) 75 MG EC tablet Take 1 tablet (75 mg total) by mouth 2 (two) times daily.   estrogens, conjugated, ([name]) 1.25 MG tablet Take 1.25 mg by mouth daily.   lamoTRIgine (LAMICTAL) 100 MG tablet Take 1 tablet (100 mg total) by mouth 2 (two) times daily.   ondansetron (ZOFRAN) 4 MG tablet Take 1 tablet (4 mg total) by mouth every 8 (eight) hours as needed for nausea or vomiting.   oxyCODONE (OXY IR/ROXICODONE) 5 MG immediate release tablet Take 5  mg by mouth every 8 (eight) hours as needed for severe pain (pain score 7-10).   pantoprazole (PROTONIX) 40 MG tablet Take 1 tablet (40 mg total) by mouth daily.   rizatriptan (MAXALT-MLT) 10 MG disintegrating tablet Take 1 tablet (10 mg total) by mouth as needed for migraine. May repeat in 2 hours if needed   simvastatin (ZOCOR) 80 MG tablet Take 1 tablet (80 mg total) by mouth daily.   [name] 40 MG tablet TAKE 1 TABLET BY MOUTH DAILY   verapamil (CALAN-SR) 240 MG CR tablet TAKE 1 TABLET BY MOUTH DAILY   vitamin B-12 (CYANOCOBALAMIN) 500 MCG tablet Take 500 mcg by mouth daily.   [DISCONTINUED] acetaminophen-codeine (TYLENOL #3) 300-30 MG tablet Take 1 tablet by mouth every 6 (six) hours as needed for moderate pain. (Patient not taking: Reported on 12/11/2022)   [DISCONTINUED] gabapentin (NEURONTIN) 100 MG capsule Take 1 capsule (100 mg total) by  mouth 3 (three) times daily. (Patient not taking: Reported on 09/30/2022)   [DISCONTINUED] predniSONE (STERAPRED UNI-PAK 21 TAB) 10 MG (21) TBPK tablet Take 6 tablets on the first day and decrease by 1 tablet each day until finished. (Patient not taking: Reported on 09/30/2022)   [DISCONTINUED] predniSONE (STERAPRED UNI-PAK 21 TAB) 10 MG (21) TBPK tablet Take 6 tablets by mouth on day 1 and decrease by 1 tablet for each subsequent day (Patient not taking: Reported on 09/30/2022)   [DISCONTINUED] promethazine (PHENERGAN) 25 MG tablet Take 1 tablet (25 mg) one hour prior to procedure. (Patient not taking: Reported on 09/30/2022)   [DISCONTINUED] simvastatin (ZOCOR) 40 MG tablet Take 1 tablet (40 mg total) by mouth at bedtime. (Patient not taking: Reported on 01/13/2023)   No facility-administered medications prior to visit.    Review of Systems All negative Except see HPI       Objective    BP 107/70 (BP Location: Right Arm, Patient Position: Sitting, Cuff Size: Normal)   Pulse (!) 54   Ht 5\' 5"  (1.651 m)   Wt 122 lb 4.8 oz (55.5 kg)   SpO2 98%   BMI 20.35 kg/m     Physical Exam Vitals reviewed.  Constitutional:      General: She is not in acute distress.    Appearance: Normal appearance. She is well-developed. She is not diaphoretic.  HENT:     Head: Normocephalic and atraumatic.  Eyes:     General: No scleral icterus.    Conjunctiva/sclera: Conjunctivae normal.  Neck:     Thyroid: No thyromegaly.  Cardiovascular:     Rate and Rhythm: Normal rate and regular rhythm.     Pulses: Normal pulses.     Heart sounds: Normal heart sounds. No murmur heard. Pulmonary:     Effort: Pulmonary effort is normal. No respiratory distress.     Breath sounds: Normal breath sounds. No wheezing, rhonchi or rales.  Musculoskeletal:     Cervical back: Neck supple.     Right lower leg: No edema.     Left lower leg: No edema.  Lymphadenopathy:     Cervical: No cervical adenopathy.  Skin:     General: Skin is warm and dry.     Findings: No rash.  Neurological:     Mental Status: She is alert and oriented to person, place, and time. Mental status is at baseline.  Psychiatric:        Mood and Affect: Mood normal.        Behavior: Behavior normal.     No  results found for any visits on 01/13/23.      Assessment and Plan    Vit B12 deficiency improved On Vitamin B12 Supplementation Elevated B12 levels with concurrent macrocytosis. No anemia present. Considering her extensive cardiovascular history, CKD and osteoporosis, advised to d/c daily B12 supplementation. High intake of vit B12 has been associated with an increased risk of hip fracture among postmenopausal women, which is particularly relevant given the pat's osteoporosis Will follow-up  Hyperlipidemia chronic Elevated triglycerides and increased risk for heart attack and stroke. -Continue Simvastatin 80mg  daily. Check with pt why she d/c simvastatin? Refill will be sent -Start over-the-counter fish oil supplementation, up to 4 grams daily. -Check lipid panel at next visit. Will follow-up  Dehydration Could be due to dehydration Slight increase in creatinine, still within normal limits and GFR of 60, previously it was 74 and 58 -Avoid NSAIDs. -Increase water intake. Will follow-up  Osteoporosis chronic Patient compliant with Vitamin D and Calcium supplementation. -Continue Vitamin D and Calcium supplementation. -Order DEXA scan to assess bone density in nov of 2024 Will reassess after  receiving lab results  Menopausal Symptoms chronic Patient experiencing severe hot flashes, currently on [name]. -Consider discontinuing [name] due to increased risk of VTE in smoker.  -Reevaluate at next visit after labs and imaging. Will follow-up  Smoking chronic Patient is a current smoker, half a pack per day. -Consider starting chantix for smoking cessation, cannot use bupropion due to seizure  disorder Will follow-up  Seizure Disorder chronic Seizure-free for 4-5 years on Lamictal. -Continue Lamictal as prescribed by neurologist. Will follow-up   CHF/CAD/Aortic atherosclerosis HTN Chronic and currently stable Continue taking diovan 40mg , verapamil 240mg  Continue low salt diet and daily exercise Patient has not seen a cardiologist recently. -Consider referral to cardiology for annual check-up if pt agrees. Will FOLLOW-UP  COPD chronic Patient is a smoker but reports no symptoms of COPD, no SOB. -Monitor for development of symptoms such as shortness of breath, wheezing, or coughing. Will monitor  Caregiver stress Dedicated to her sister's care Will refer to social worker if pt agrees  General Health Maintenance -Schedule annual wellness visit. -Schedule mammogram and lung cancer screening. -Continue to monitor blood pressure and kidney function.     No orders of the defined types were placed in this encounter.   No follow-ups on file.   The patient was advised to call back or seek an in-person evaluation if the symptoms worsen or if the condition fails to improve as anticipated.  I discussed the assessment and treatment plan with the patient. The patient was provided an opportunity to ask questions and all were answered. The patient agreed with the plan and demonstrated an understanding of the instructions.  I, Debera Lat, PA-C have reviewed all documentation for this visit. The documentation on 01/13/2023  for the exam, diagnosis, procedures, and orders are all accurate and complete.  Debera Lat, [facility], MMS [facility] [phone] (phone) [phone] (fax)  [facility]

## 2023-01-13 ENCOUNTER — Encounter: Payer: Self-pay | Admitting: Physician Assistant

## 2023-01-13 ENCOUNTER — Ambulatory Visit (INDEPENDENT_AMBULATORY_CARE_PROVIDER_SITE_OTHER): Payer: 59 | Admitting: Physician Assistant

## 2023-01-13 VITALS — BP 107/70 | HR 54 | Ht 65.0 in | Wt 122.3 lb

## 2023-01-13 DIAGNOSIS — I5022 Chronic systolic (congestive) heart failure: Secondary | ICD-10-CM | POA: Diagnosis not present

## 2023-01-13 DIAGNOSIS — I700 Atherosclerosis of aorta: Secondary | ICD-10-CM

## 2023-01-13 DIAGNOSIS — I10 Essential (primary) hypertension: Secondary | ICD-10-CM | POA: Diagnosis not present

## 2023-01-13 DIAGNOSIS — M810 Age-related osteoporosis without current pathological fracture: Secondary | ICD-10-CM | POA: Diagnosis not present

## 2023-01-13 DIAGNOSIS — F17210 Nicotine dependence, cigarettes, uncomplicated: Secondary | ICD-10-CM | POA: Diagnosis not present

## 2023-01-13 DIAGNOSIS — Z1231 Encounter for screening mammogram for malignant neoplasm of breast: Secondary | ICD-10-CM

## 2023-01-13 DIAGNOSIS — E7800 Pure hypercholesterolemia, unspecified: Secondary | ICD-10-CM | POA: Diagnosis not present

## 2023-01-13 DIAGNOSIS — J449 Chronic obstructive pulmonary disease, unspecified: Secondary | ICD-10-CM | POA: Diagnosis not present

## 2023-01-13 DIAGNOSIS — Z636 Dependent relative needing care at home: Secondary | ICD-10-CM

## 2023-01-13 DIAGNOSIS — R569 Unspecified convulsions: Secondary | ICD-10-CM | POA: Diagnosis not present

## 2023-01-13 DIAGNOSIS — N189 Chronic kidney disease, unspecified: Secondary | ICD-10-CM | POA: Diagnosis not present

## 2023-01-13 DIAGNOSIS — E538 Deficiency of other specified B group vitamins: Secondary | ICD-10-CM

## 2023-01-13 DIAGNOSIS — E8941 Symptomatic postprocedural ovarian failure: Secondary | ICD-10-CM

## 2023-01-13 NOTE — Patient Instructions (Signed)
Please contact [phone] to schedule your mammogram. You will be asked your location preference to have procedure performed. You have two options listed below.  1) [facility] located at [address] [place], [place] [address] 2) [facility] [place] located at [address] [place],  [address]  Upon results being received our office will contact you. As well as all results can be viewed through your MyChart. Please feel free to contact us if you have any further questions or concerns.   

## 2023-01-13 NOTE — Patient Instructions (Signed)
Below is our plan:  We will continue lamotrigine 100mg  twice daily and rizatriptan as needed.   Please make sure you are consistent with timing of seizure medication. I recommend annual visit with primary care provider (PCP) for complete physical and routine blood work. I recommend daily intake of vitamin D (400-800iu) and calcium (800-1000mg ) for bone health. Discuss Dexa screening with PCP.   According to [place] law, you can not drive unless you are seizure / syncope free for at least 6 months and under physician's care.  Please maintain precautions. Do not participate in activities where a loss of awareness could harm you or someone else. No swimming alone, no tub bathing, no hot tubs, no driving, no operating motorized vehicles (cars, ATVs, motocycles, etc), lawnmowers, power tools or firearms. No standing at heights, such as rooftops, ladders or stairs. Avoid hot objects such as stoves, heaters, open fires. Wear a helmet when riding a bicycle, scooter, skateboard, etc. and avoid areas of traffic. Set your water heater to 120 degrees or less.  SUDEP is the sudden, unexpected death of someone with epilepsy, who was otherwise healthy. In SUDEP cases, no other cause of death is found when an autopsy is done. Each year, more than 1 in 1,000 people with epilepsy die from SUDEP. This is the leading cause of death in people with uncontrolled seizures. Until further answers are available, the best way to prevent SUDEP is to lower your risk by controlling seizures. Research has found that people with all types of epilepsy that experience convulsive seizures can be at risk.  Please make sure you are staying well hydrated. I recommend 50-60 ounces daily. Well balanced diet and regular exercise encouraged. Consistent sleep schedule with 6-8 hours recommended.   Please continue follow up with care team as directed.   Follow up with me in 1 year   You may receive a survey regarding today's visit. I encourage you  to leave honest feed back as I do use this information to improve patient care. Thank you for seeing me today!   GENERAL HEADACHE INFORMATION:   Natural supplements: Magnesium Oxide or Magnesium Glycinate 500 mg at bed (up to 800 mg daily) Coenzyme Q10 300 mg in AM Vitamin B2- 200 mg twice a day   Add 1 supplement at a time since even natural supplements can have undesirable side effects. You can sometimes buy supplements cheaper (especially Coenzyme Q10) at www.WebmailGuide.co.za or at [employer]  Migraine with aura: There is increased risk for stroke in women with migraine with aura and a contraindication for the combined contraceptive pill for use by women who have migraine with aura. The risk for women with migraine without aura is lower. However other risk factors like smoking are far more likely to increase stroke risk than migraine. There is a recommendation for no smoking and for the use of OCPs without estrogen such as progestogen only pills particularly for women with migraine with aura.Marland Kitchen People who have migraine headaches with auras may be 3 times more likely to have a stroke caused by a blood clot, compared to migraine patients who don't see auras. Women who take hormone-replacement therapy may be 30 percent more likely to suffer a clot-based stroke than women not taking medication containing estrogen. Other risk factors like smoking and high blood pressure may be  much more important.    Vitamins and herbs that show potential:   Magnesium: Magnesium (250 mg twice a day or 500 mg at bed) has a relaxant effect  on smooth muscles such as blood vessels. Individuals suffering from frequent or daily headache usually have low magnesium levels which can be increase with daily supplementation of 400-750 mg. Three trials found 40-90% average headache reduction  when used as a preventative. Magnesium may help with headaches are aura, the best evidence for magnesium is for migraine with aura is its thought to  stop the cortical spreading depression we believe is the pathophysiology of migraine aura.Magnesium also demonstrated the benefit in menstrually related migraine.  Magnesium is part of the messenger system in the serotonin cascade and it is a good muscle relaxant.  It is also useful for constipation which can be a side effect of other medications used to treat migraine. Good sources include nuts, whole grains, and tomatoes. Side Effects: loose stool/diarrhea  Riboflavin (vitamin B 2) 200 mg twice a day. This vitamin assists nerve cells in the production of ATP a principal energy storing molecule.  It is necessary for many chemical reactions in the body.  There have been at least 3 clinical trials of riboflavin using 400 mg per day all of which suggested that migraine frequency can be decreased.  All 3 trials showed significant improvement in over half of migraine sufferers.  The supplement is found in bread, cereal, milk, meat, and poultry.  Most Americans get more riboflavin than the recommended daily allowance, however riboflavin deficiency is not necessary for the supplements to help prevent headache. Side effects: energizing, green urine   Coenzyme Q10: This is present in almost all cells in the body and is critical component for the conversion of energy.  Recent studies have shown that a nutritional supplement of CoQ10 can reduce the frequency of migraine attacks by improving the energy production of cells as with riboflavin.  Doses of 150 mg twice a day have been shown to be effective.   Melatonin: Increasing evidence shows correlation between melatonin secretion and headache conditions.  Melatonin supplementation has decreased headache intensity and duration.  It is widely used as a sleep aid.  Sleep is natures way of dealing with migraine.  A dose of 3 mg is recommended to start for headaches including cluster headache. Higher doses up to 15 mg has been reviewed for use in Cluster headache and have  been used. The rationale behind using melatonin for cluster is that many theories regarding the cause of Cluster headache center around the disruption of the normal circadian rhythm in the brain.  This helps restore the normal circadian rhythm.   HEADACHE DIET: Foods and beverages which may trigger migraine Note that only 20% of headache patients are food sensitive. You will know if you are food sensitive if you get a headache consistently 20 minutes to 2 hours after eating a certain food. Only cut out a food if it causes headaches, otherwise you might remove foods you enjoy! What matters most for diet is to eat a well balanced healthy diet full of vegetables and low fat protein, and to not miss meals.   Chocolate, other sweets ALL cheeses except cottage and cream cheese Dairy products, yogurt, sour cream, ice cream Liver Meat extracts (Bovril, Marmite, meat tenderizers) Meats or fish which have undergone aging, fermenting, pickling or smoking. These include: Hotdogs,salami,Lox,sausage, mortadellas,smoked salmon, pepperoni, Pickled herring Pods of broad bean (English beans, Chinese pea pods, [place] (fava) beans, lima and navy beans Ripe avocado, ripe banana Yeast extracts or active yeast preparations such as Brewer's or Fleishman's (commercial bakes goods are permitted) Tomato based foods, pizza (lasagna,  etc.)   MSG (monosodium glutamate) is disguised as many things; look for these common aliases: Monopotassium glutamate Autolysed yeast Hydrolysed protein Sodium caseinate "flavorings" "all natural preservatives" Nutrasweet   Avoid all other foods that convincingly provoke headaches.   Resources: The Dizzy [place] Laundry Your Headache Diet, migrainestrong.com  https://zamora-andrews.com/   Caffeine and Migraine For patients that have migraine, caffeine intake more than 3 days per week can lead to dependency and increased migraine frequency.  I would recommend cutting back on your caffeine intake as best you can. The recommended amount of caffeine is 200-300 mg daily, although migraine patients may experience dependency at even lower doses. While you may notice an increase in headache temporarily, cutting back will be helpful for headaches in the long run. For more information on caffeine and migraine, visit: https://americanmigrainefoundation.org/resource-library/caffeine-and-migraine/   Headache Prevention Strategies:   1. Maintain a headache diary; learn to identify and avoid triggers.  - This can be a simple note where you log when you had a headache, associated symptoms, and medications used - There are several smartphone apps developed to help track migraines: Migraine Buddy, Migraine Monitor, Curelator N1-Headache App   Common triggers include: Emotional triggers: Emotional/Upset family or friends Emotional/Upset occupation Business reversal/success Anticipation anxiety Crisis-serious Post-crisis periodNew job/position   Physical triggers: Vacation Day Weekend Strenuous Exercise High Altitude Location New Move Menstrual Day Physical Illness Oversleep/Not enough sleep Weather changes Light: Photophobia or light sesnitivity treatment involves a balance between desensitization and reduction in overly strong input. Use dark polarized glasses outside, but not inside. Avoid bright or fluorescent light, but do not dim environment to the point that going into a normally lit room hurts. Consider FL-41 tint lenses, which reduce the most irritating wavelengths without blocking too much light.  These can be obtained at axonoptics.com or theraspecs.com Foods: see list above.   2. Limit use of acute treatments (over-the-counter medications, triptans, etc.) to no more than 2 days per week or 10 days per month to prevent medication overuse headache (rebound headache).     3. Follow a regular schedule (including weekends and  holidays): Don't skip meals. Eat a balanced diet. 8 hours of sleep nightly. Minimize stress. Exercise 30 minutes per day. Being overweight is associated with a 5 times increased risk of chronic migraine. Keep well hydrated and drink 6-8 glasses of water per day.   4. Initiate non-pharmacologic measures at the earliest onset of your headache. Rest and quiet environment. Relax and reduce stress. Breathe2Relax is a free app that can instruct you on    some simple relaxtion and breathing techniques. Http://Dawnbuse.com is a    free website that provides teaching videos on relaxation.  Also, there are  many apps that   can be downloaded for "mindful" relaxation.  An app called YOGA NIDRA will help walk you through mindfulness. Another app called Calm can be downloaded to give you a structured mindfulness guide with daily reminders and skill development. Headspace for guided meditation Mindfulness Based Stress Reduction Online Course: www.palousemindfulness.com Cold compresses.   5. Don't wait!! Take the maximum allowable dosage of prescribed medication at the first sign of migraine.   6. Compliance:  Take prescribed medication regularly as directed and at the first sign of a migraine.   7. Communicate:  Call your physician when problems arise, especially if your headaches change, increase in frequency/severity, or become associated with neurological symptoms (weakness, numbness, slurred speech, etc.). Proceed to emergency room if you experience new or worsening symptoms or symptoms  do not resolve, if you have new neurologic symptoms or if headache is severe, or for any concerning symptom.   8. Headache/pain management therapies: Consider various complementary methods, including medication, behavioral therapy, psychological counselling, biofeedback, massage therapy, acupuncture, dry needling, and other modalities.  Such measures may reduce the need for medications. Counseling for pain management, where  patients learn to function and ignore/minimize their pain, seems to work very well.   9. Recommend changing family's attention and focus away from patient's headaches. Instead, emphasize daily activities. If first question of day is 'How are your headaches/Do you have a headache today?', then patient will constantly think about headaches, thus making them worse. Goal is to re-direct attention away from headaches, toward daily activities and other distractions.   10. Helpful Websites: www.AmericanHeadacheSociety.org PatentHood.ch www.headaches.org TightMarket.nl www.achenet.org

## 2023-01-13 NOTE — Progress Notes (Unsigned)
PATIENT: Kayla Shah Kayla Shah DOB: [birth year]  REASON FOR VISIT: follow up HISTORY FROM: patient  No chief complaint on file.    HISTORY OF PRESENT ILLNESS:  01/13/23 ALL:  Kayla Shah returns for follow up for seizures and migraines. She continues lamotrigine 100mg  BID and rizatriptan as needed.   10/23/2021 ALL:  Kayla Shah returns for follow up for seizures and migraines. She continues lamotrigine 100mg  twice daily. No seizure activity. She rarely has a headache. She has not used rizatriptan recently. She has recovered well following hip replacement surgery.   09/19/2020 ALL: Kayla Shah returns for follow up for seizures and migraines. She reports doing very well. She continues lamotrigine 100mg  BID and rizatriptan as needed. She rarely has headaches. She can not remember the last time she needed rizatriptan. She feels tremor is better off divalproex. Some action tremor, especially if anxious or tired. She denies seizure activity. She is having right hip pain. She is trying to find a surgeon who will consider hip replacement.   09/20/2019 ALL:  Kayla Shah is a 67 y.o. female here today for follow up for seizures and migraines. She reports doing well. She has tolerated lamotrigine well. She feels that tremor has significantly improved since discontinuing divalproex. She can write again. She can hold a coffee cup. She feels that migraines are well managed. She can't remember the last time she took rizatriptan. She is very happy with current treatment regimen. She is also very proud to report that she recently quit smoking. She is feeling better physically.   04/27/2019 ALL: Kayla Shah is a 67 y.o. female here today for follow up for seizures. She continues divalproex 750mg  twice daily. She reports that tremors have significantly worsened. She is unable to hold a coffee cup. She has had significant weight loss as well. She does endorse depression and anxiety but has been followed closely by  PCP and decline medications for this. She denies difficulty swallowing. No changes in gait. She denies changes in movements with exception of tremors. No family history of PD. Symptoms have progressively worsened since starting divalproex in May 2020. No seizure activity. She does not drive. PCP started Megace last month. Headaches are well managed at this time.    09/06/2018 ALL:  Kayla Shah is a 67 y.o. female here today for follow up for seizure. She was admitted to [facility] on 06/12/2018 following two seizures at home. Depakote was subtherapeutic on admission. She denies missed doses. Dose was increased to 750mg  twice daily. She has noticed an increase in tremor since increasing dose but is tolerating well otherwise. She denies seizure activity. She is feeling well today and without complaints.    Upon review of ER notes, she had elevated troponin and abnormal EKG (inferior ST depression). Cardiology consulted and advised additional testing. She has extensive family history of cardiovascular disease. She is treated for HTN and told that lipids are elevated (not currently treated). She is smoking with no desire to quit.    HISTORY: (copied from Dr Richrd Humbles note on 11/18/2017)   67 year old female here for evaluation of seizure and migraine.   Patient first developed seizures in 43s.  She was on Keppra at that time.  At some point she was taken off of Keppra.  2014 she was hospitalized for seizure.  She may have been on and off of Keppra following 2014 but she is not sure.  Patient reports grand mal seizures and postictal confusion symptoms but is not quite sure  about the frequency.  She had another seizure possibly in September 2019.  She is not on antiseizure medication at this time.   Patient has history of migraine headaches at age 51 years old with pounding throbbing headaches, nausea, photophobia, typically unilateral right greater than left-sided headaches.  She averages 4-5  headaches per month.  She is not sure what type of medication she has been on the past.   Patient also has history of depression, anxiety, stress, weight loss, chronic pain.    REVIEW OF SYSTEMS: Out of a complete 14 system review of symptoms, the patient complains only of the following symptoms, hip pain and all other reviewed systems are negative.   ALLERGIES: Allergies  Allergen Reactions   Codeine Nausea And Vomiting   Other     No Anti Depressants - "makes me crazy"   Fluoxetine Anxiety   Paroxetine Anxiety    Becomes very aggressive with mood swings.    HOME MEDICATIONS: Outpatient Medications Prior to Visit  Medication Sig Dispense Refill   diazepam (VALIUM) 5 MG tablet Take 5 mg by mouth 3 (three) times daily as needed.     diclofenac (VOLTAREN) 75 MG EC tablet Take 1 tablet (75 mg total) by mouth 2 (two) times daily. 30 tablet 0   estrogens, conjugated, ([name]) 1.25 MG tablet Take 1.25 mg by mouth daily.     lamoTRIgine (LAMICTAL) 100 MG tablet Take 1 tablet (100 mg total) by mouth 2 (two) times daily. 180 tablet 0   ondansetron (ZOFRAN) 4 MG tablet Take 1 tablet (4 mg total) by mouth every 8 (eight) hours as needed for nausea or vomiting. 20 tablet 0   oxyCODONE (OXY IR/ROXICODONE) 5 MG immediate release tablet Take 5 mg by mouth every 8 (eight) hours as needed for severe pain (pain score 7-10).     pantoprazole (PROTONIX) 40 MG tablet Take 1 tablet (40 mg total) by mouth daily. 90 tablet 3   rizatriptan (MAXALT-MLT) 10 MG disintegrating tablet Take 1 tablet (10 mg total) by mouth as needed for migraine. May repeat in 2 hours if needed 9 tablet 11   simvastatin (ZOCOR) 80 MG tablet Take 1 tablet (80 mg total) by mouth daily. 30 tablet 0   [name] 40 MG tablet TAKE 1 TABLET BY MOUTH DAILY 30 tablet 0   verapamil (CALAN-SR) 240 MG CR tablet TAKE 1 TABLET BY MOUTH DAILY 90 tablet 0   vitamin B-12 (CYANOCOBALAMIN) 500 MCG tablet Take 500 mcg by mouth daily.      No facility-administered medications prior to visit.    PAST MEDICAL HISTORY: Past Medical History:  Diagnosis Date   Allergy    Anxiety    agoraphobia    Arthritis    Cataract    Chicken pox    COPD (chronic obstructive pulmonary disease) (HCC)    Depression    Family history of adverse reaction to anesthesia    PONV   GERD (gastroesophageal reflux disease)    Headache    History of blood transfusion    1984   Hypertension    Migraine    Osteoporosis    PONV (postoperative nausea and vomiting)    Seizure (HCC)    last seizure 04/2019    PAST SURGICAL HISTORY: Past Surgical History:  Procedure Laterality Date   APPENDECTOMY  1984   CATARACT EXTRACTION, BILATERAL Bilateral    COLONOSCOPY WITH PROPOFOL N/A 03/26/2017   Procedure: COLONOSCOPY WITH PROPOFOL;  Surgeon: Wyline Mood, MD;  Location: [facility]  ENDOSCOPY;  Service: Gastroenterology;  Laterality: N/A;   EYE SURGERY     HERNIA REPAIR     JOINT REPLACEMENT     TOTAL ABDOMINAL HYSTERECTOMY  1984   Total   TOTAL HIP ARTHROPLASTY Right 11/19/2020   Procedure: RIGHT TOTAL HIP ARTHROPLASTY ANTERIOR APPROACH;  Surgeon: Tarry Kos, MD;  Location: [facility] OR;  Service: Orthopedics;  Laterality: Right;  C-3   VENTRAL HERNIA REPAIR N/A 09/16/2019   Procedure: HERNIA REPAIR VENTRAL ADULT, Open;  Surgeon: Duanne Guess, MD;  Location: [facility] ORS;  Service: General;  Laterality: N/A;   WISDOM TOOTH EXTRACTION      FAMILY HISTORY: Family History  Problem Relation Age of Onset   Stroke Mother    Depression Mother    Arthritis Mother    Heart disease Father    Alcohol abuse Father    Diabetes Father    Osteoporosis Sister    Kidney disease Sister    Arthritis Sister    Dementia Sister    Arthritis Maternal Grandmother    Heart disease Maternal Grandmother    Hyperlipidemia Maternal Grandmother    Diabetes Paternal Grandmother    Breast cancer Neg Hx     SOCIAL HISTORY: Social History   Socioeconomic History    Marital status: Divorced    Spouse name: Casimiro Needle   Number of children: 0   Years of education: 12   Highest education level: Not on file  Occupational History    Comment: disabled  Tobacco Use   Smoking status: Every Day    Current packs/day: 1.00    Average packs/day: 1 pack/day for 45.0 years (45.0 ttl pk-yrs)    Types: Cigarettes   Smokeless tobacco: Never   Tobacco comments:    reports quitting in feb 2021  Vaping Use   Vaping status: Never Used  Substance and Sexual Activity   Alcohol use: No   Drug use: Yes    Types: Marijuana    Comment: every day   Sexual activity: Never  Other Topics Concern   Not on file  Social History Narrative   Disabled    Married no kids    No guns, wears seat belt, feels safe in relationship    12th grade ed.    Social Determinants of Health   Financial Resource Strain: Medium Risk (09/17/2021)   Overall Financial Resource Strain (CARDIA)    Difficulty of Paying Living Expenses: Somewhat hard  Food Insecurity: Unknown (09/17/2021)   Hunger Vital Sign    Worried About Running Out of Food in the Last Year: Not on file    Ran Out of Food in the Last Year: Never true  Transportation Needs: No Transportation Needs (02/18/2021)   PRAPARE - Administrator, Civil Service (Medical): No    Lack of Transportation (Non-Medical): No  Physical Activity: Sufficiently Active (02/18/2021)   Exercise Vital Sign    Days of Exercise per Week: 4 days    Minutes of Exercise per Session: 40 min  Stress: Stress Concern Present (09/17/2021)   [employer] of Occupational Health - Occupational Stress Questionnaire    Feeling of Stress : Rather much  Social Connections: Socially Isolated (02/18/2021)   Social Connection and Isolation Panel [NHANES]    Frequency of Communication with Friends and Family: More than three times a week    Frequency of Social Gatherings with Friends and Family: Three times a week    Attends Religious Services: Never     Active Member of  Clubs or Organizations: No    Attends Banker Meetings: Never    Marital Status: Divorced  Catering manager Violence: Not At Risk (02/18/2021)   Humiliation, Afraid, Rape, and Kick questionnaire    Fear of Current or Ex-Partner: No    Emotionally Abused: No    Physically Abused: No    Sexually Abused: No      PHYSICAL EXAM  There were no vitals filed for this visit.    There is no height or weight on file to calculate BMI.  Generalized: Well developed, in no acute distress  Cardiology: normal rate and rhythm, no murmur noted Respiratory: clear to auscultation bilaterally  Neurological examination  Mentation: Alert oriented to time, place, history taking. Follows all commands speech and language fluent Cranial nerve II-XII: Pupils were equal round reactive to light. Extraocular movements were full, visual field were full  Motor: The motor testing reveals 5 over 5 strength of all 4 extremities. Good symmetric motor tone is noted throughout.  Gait and station: Gait is arthritic  DIAGNOSTIC DATA (LABS, IMAGING, TESTING) - I reviewed patient records, labs, notes, testing and imaging myself where available.      No data to display           Lab Results  Component Value Date   WBC 6.0 12/11/2022   HGB 12.5 12/11/2022   HCT 38.0 12/11/2022   MCV 101 (H) 12/11/2022   PLT 231 12/11/2022      Component Value Date/Time   NA 140 12/11/2022 1450   NA 139 08/28/2013 1805   K 4.0 12/11/2022 1450   K 3.5 08/28/2013 1805   CL 99 12/11/2022 1450   CL 101 08/28/2013 1805   CO2 27 12/11/2022 1450   CO2 34 (H) 08/28/2013 1805   GLUCOSE 88 12/11/2022 1450   GLUCOSE 129 (H) 11/20/2020 0511   GLUCOSE 91 08/28/2013 1805   BUN 11 12/11/2022 1450   BUN 13 08/28/2013 1805   CREATININE 1.03 (H) 12/11/2022 1450   CREATININE 1.27 08/28/2013 1805   CALCIUM 9.1 12/11/2022 1450   CALCIUM 8.8 08/28/2013 1805   PROT 6.4 12/11/2022 1450   PROT 7.8  08/28/2013 1805   ALBUMIN 4.1 12/11/2022 1450   ALBUMIN 4.2 08/28/2013 1805   AST 11 12/11/2022 1450   AST 23 08/28/2013 1805   ALT 7 12/11/2022 1450   ALT 16 08/28/2013 1805   ALKPHOS 70 12/11/2022 1450   ALKPHOS 64 08/28/2013 1805   BILITOT <0.2 12/11/2022 1450   BILITOT 0.3 08/28/2013 1805   GFRNONAA >60 11/20/2020 0511   GFRNONAA 47 (L) 08/28/2013 1805   GFRAA 83 06/28/2019 1312   GFRAA 54 (L) 08/28/2013 1805   Lab Results  Component Value Date   CHOL 181 12/11/2022   HDL 61 12/11/2022   LDLCALC 77 12/11/2022   TRIG 267 (H) 12/11/2022   CHOLHDL 3.0 12/11/2022   Lab Results  Component Value Date   HGBA1C 5.3 12/11/2022   Lab Results  Component Value Date   VITAMINB12 1,807 (H) 12/11/2022   Lab Results  Component Value Date   TSH 1.800 12/11/2022       ASSESSMENT AND PLAN 67 y.o. year old female  has a past medical history of Allergy, Anxiety, Arthritis, Cataract, Chicken pox, COPD (chronic obstructive pulmonary disease) (HCC), Depression, Family history of adverse reaction to anesthesia, GERD (gastroesophageal reflux disease), Headache, History of blood transfusion, Hypertension, Migraine, Osteoporosis, PONV (postoperative nausea and vomiting), and Seizure (HCC). here with  ICD-10-CM   1. Seizures (HCC)  R56.9     2. Migraine without aura and without status migrainosus, not intractable  G43.009          Is doing very well, today.  She has tolerated lamotrigine 100 mg twice daily.  We will continue current treatment plan.  She will continue to use rizatriptan as needed for migraine abortion. She is now a non-smoker.  I have commended her on smoking cessation and continuation of healthy lifestyle habits.  She will continue close follow-up with primary care.  She will follow-up with me in 1 year, sooner if needed.  She verbalizes understanding and agreement with this plan.   No orders of the defined types were placed in this encounter.     No orders of  the defined types were placed in this encounter.     Shawnie Dapper, FNP-C 01/13/2023, 2:01 PM [facility] [address], [address] [place], [place] [address] [phone]

## 2023-01-14 ENCOUNTER — Encounter: Payer: Self-pay | Admitting: Family Medicine

## 2023-01-14 ENCOUNTER — Ambulatory Visit (INDEPENDENT_AMBULATORY_CARE_PROVIDER_SITE_OTHER): Payer: 59 | Admitting: Family Medicine

## 2023-01-14 ENCOUNTER — Telehealth: Payer: Self-pay | Admitting: Family Medicine

## 2023-01-14 VITALS — BP 117/68 | HR 60 | Ht 65.0 in | Wt 125.0 lb

## 2023-01-14 DIAGNOSIS — G43009 Migraine without aura, not intractable, without status migrainosus: Secondary | ICD-10-CM

## 2023-01-14 DIAGNOSIS — R569 Unspecified convulsions: Secondary | ICD-10-CM

## 2023-01-14 MED ORDER — RIZATRIPTAN BENZOATE 10 MG PO TBDP: 10.0000 mg | ORAL_TABLET | ORAL | 11 refills | Status: AC | PRN

## 2023-01-14 MED ORDER — LAMOTRIGINE 100 MG PO TABS: 100.0000 mg | ORAL_TABLET | Freq: Two times a day (BID) | ORAL | 3 refills | Status: AC

## 2023-01-14 MED ORDER — SIMVASTATIN 80 MG PO TABS: 80.0000 mg | ORAL_TABLET | Freq: Every day | ORAL | 1 refills | Status: DC

## 2023-01-14 NOTE — Telephone Encounter (Signed)
Pt called to confirm appt time because she may be late. Advised as long as she's here before her appt time she'll be fine

## 2023-01-21 ENCOUNTER — Ambulatory Visit: Payer: Self-pay | Admitting: *Deleted

## 2023-01-21 NOTE — Telephone Encounter (Signed)
"  I'm on 5 mg Percocet and I'm sitting here smoking a cigarette".   "If I need pain medicine for 2 days".  "She gives me 5".    "Can I half one?"   "I need it every 6 hours".   "You are the only other dr. I have".   [facility])    I asked her who prescribed it.    "A dr, whose name I can't pronounce at [facility]".    I referred her back to [facility] since they are the prescribing provider.   "I did".  Pt. Did not elaborate further.   I also let her know since this is a controlled substance that the prescribing provider would have to advise her on taking this.   She thanked me very much for my time and stated,  "I didn't want to waste your time".    I let her know it was ok but in this situation I needed her to refer to her prescribing provider.     I had to ask her identifying information prior to ending the call because when she answered the phone she started immediately with the above conversation.

## 2023-01-21 NOTE — Telephone Encounter (Signed)
Message from Russell E sent at 01/21/2023 10:09 AM EST  Summary: Questions for a nurse regarding pain management   Pt has questions about pain management, wants to speak to a nurse  Best contact: [phone]          Call History  Contact Date/Time Type Contact Phone/Fax By  01/21/2023 10:08 AM EST Phone (Incoming) [place], Leslian Battaglino (Self) [phone] Rexene Edison) Randol Kern   Reason for Disposition  Caller has medicine question only, adult not sick, AND triager answers question    Referred her to the provider at [facility] who prescribed the Percocet for further directions on how to take it.  Answer Assessment - Initial Assessment Questions 1. NAME of MEDICINE: "What medicine(s) are you calling about?"     Percocet 5 mg 2. QUESTION: "What is your question?" (e.g., double dose of medicine, side effect)     See documentation notes for detailed conversation.    She called asking about taking half of the Percocet.  I referred her back to the provider at [facility] for further directions.     3. PRESCRIBER: "Who prescribed the medicine?" Reason: if prescribed by specialist, call should be referred to that group.     Provider with [facility].    4. SYMPTOMS: "Do you have any symptoms?" If Yes, ask: "What symptoms are you having?"  "How bad are the symptoms (e.g., mild, moderate, severe)     She takes Percocet for pain.  Again see conversation details. 5. PREGNANCY:  "Is there any chance that you are pregnant?" "When was your last menstrual period?"     N/A due to age  Protocols used: Medication Question Call-A-AH  Chief Complaint: Had question on taking Percocet and needing 2 more days of it.   The provider that prescribed it is at [facility] so I referred her to that provider.   Symptoms: "I take it for pain" Frequency: Every 6 hours Pertinent Negatives: Patient denies N/A Disposition: [] ED /[] Urgent Care (no appt availability in office) / [] Appointment(In  office/virtual)/ []  [place]/ [x] Home Care/ [] Refused Recommended Disposition /[]  Mobile Bus/ []  Follow-up with PCP Additional Notes: I referred her to the provider at [facility] who prescribed the Percocet.

## 2023-01-23 DIAGNOSIS — M1990 Unspecified osteoarthritis, unspecified site: Secondary | ICD-10-CM | POA: Diagnosis not present

## 2023-01-23 DIAGNOSIS — R030 Elevated blood-pressure reading, without diagnosis of hypertension: Secondary | ICD-10-CM | POA: Diagnosis not present

## 2023-01-23 DIAGNOSIS — Z79899 Other long term (current) drug therapy: Secondary | ICD-10-CM | POA: Diagnosis not present

## 2023-01-23 DIAGNOSIS — Z681 Body mass index (BMI) 19 or less, adult: Secondary | ICD-10-CM | POA: Diagnosis not present

## 2023-02-03 ENCOUNTER — Other Ambulatory Visit: Payer: Self-pay | Admitting: Physician Assistant

## 2023-02-07 NOTE — Telephone Encounter (Signed)
 Requested Prescriptions  Pending Prescriptions Disp Refills   valsartan  (DIOVAN ) 40 MG tablet [Pharmacy Med Name: VALSARTAN  40 MG TAB] 90 tablet 0    Sig: TAKE 1 TABLET BY MOUTH DAILY     Cardiovascular:  Angiotensin Receptor Blockers Failed - 02/07/2023 11:48 AM      Failed - Cr in normal range and within 180 days    Creatinine  Date Value Ref Range Status  08/28/2013 1.27 0.60 - 1.30 mg/dL Final   Creatinine, Ser  Date Value Ref Range Status  12/11/2022 1.03 (H) 0.57 - 1.00 mg/dL Final         Passed - K in normal range and within 180 days    Potassium  Date Value Ref Range Status  12/11/2022 4.0 3.5 - 5.2 mmol/L Final  08/28/2013 3.5 3.5 - 5.1 mmol/L Final         Passed - Patient is not pregnant      Passed - Last BP in normal range    BP Readings from Last 1 Encounters:  01/14/23 117/68         Passed - Valid encounter within last 6 months    Recent Outpatient Visits           3 weeks ago Chronic HFrEF (heart failure with reduced ejection fraction) (HCC)   [place] [facility] [place], [place], PA-C   1 month ago Essential (primary) hypertension   [place] [facility] [place], [place], PA-C   4 months ago Lumbar radiculopathy, chronic   [place] [facility] Pearley, Hyla Givens, NP   9 months ago Gastroesophageal reflux disease without esophagitis   [place] [facility] Daphane Rosella, NP   1 year ago Essential hypertension   [place] [facility] Daphane Rosella, NP       Future Appointments             In 3 weeks [place], Janna, PA-C [place] [facility]

## 2023-02-11 ENCOUNTER — Telehealth: Payer: Self-pay

## 2023-02-11 NOTE — Telephone Encounter (Signed)
 Copied from CRM [id number]. Topic: General - Other >> Feb 11, 2023  4:52 PM Everette C wrote: Reason for CRM: The patient has called to request follow up regarding previous discussions about their estrogens , conjugated, ([name] ) 1.25 MG tablet [[id number]] prescription   The patient is interested in discussing alternative options  Pease contact the patient further when possible

## 2023-02-12 NOTE — Telephone Encounter (Signed)
 Patient notified and she will discuss at follow up appt on 03/04/23

## 2023-02-25 ENCOUNTER — Other Ambulatory Visit: Payer: Self-pay | Admitting: Physician Assistant

## 2023-02-25 DIAGNOSIS — I10 Essential (primary) hypertension: Secondary | ICD-10-CM

## 2023-02-25 NOTE — Telephone Encounter (Signed)
Requested Prescriptions  Pending Prescriptions Disp Refills   verapamil (CALAN-SR) 240 MG CR tablet [Pharmacy Med Name: VERAPAMIL HCL ER 240 MG TAB] 90 tablet 0    Sig: TAKE 1 TABLET BY MOUTH DAILY     Cardiovascular: Calcium Channel Blockers 3 Failed - 02/25/2023 12:55 PM      Failed - Cr in normal range and within 360 days    Creatinine  Date Value Ref Range Status  08/28/2013 1.27 0.60 - 1.30 mg/dL Final   Creatinine, Ser  Date Value Ref Range Status  12/11/2022 1.03 (H) 0.57 - 1.00 mg/dL Final         Passed - ALT in normal range and within 360 days    ALT  Date Value Ref Range Status  12/11/2022 7 0 - 32 IU/L Final   SGPT (ALT)  Date Value Ref Range Status  08/28/2013 16 U/L Final    Comment:    14-63 NOTE: New Reference Range 08/23/13          Passed - AST in normal range and within 360 days    AST  Date Value Ref Range Status  12/11/2022 11 0 - 40 IU/L Final   SGOT(AST)  Date Value Ref Range Status  08/28/2013 23 15 - 37 Unit/L Final         Passed - Last BP in normal range    BP Readings from Last 1 Encounters:  01/14/23 117/68         Passed - Last Heart Rate in normal range    Pulse Readings from Last 1 Encounters:  01/14/23 60         Passed - Valid encounter within last 6 months    Recent Outpatient Visits           1 month ago Chronic HFrEF (heart failure with reduced ejection fraction) (HCC)   [place] [facility] [place], [place], PA-C   2 months ago Essential (primary) hypertension   [place] [facility] Kinsley, [place], PA-C   4 months ago Lumbar radiculopathy, chronic   [place] [facility] Pearley, Sherran Needs, NP   10 months ago Gastroesophageal reflux disease without esophagitis   [place] [facility] Gabriel Cirri, NP   1 year ago Essential hypertension   [place] [facility] Gabriel Cirri, NP       Future Appointments             In 1  week Debera Lat, PA-C [facility]

## 2023-03-02 ENCOUNTER — Other Ambulatory Visit: Payer: Self-pay | Admitting: Physician Assistant

## 2023-03-02 DIAGNOSIS — Z13820 Encounter for screening for osteoporosis: Secondary | ICD-10-CM

## 2023-03-02 DIAGNOSIS — Z1231 Encounter for screening mammogram for malignant neoplasm of breast: Secondary | ICD-10-CM

## 2023-03-04 ENCOUNTER — Ambulatory Visit: Payer: 59 | Admitting: Physician Assistant

## 2023-03-11 ENCOUNTER — Ambulatory Visit: Payer: 59 | Admitting: Physician Assistant

## 2023-03-20 ENCOUNTER — Other Ambulatory Visit: Payer: Self-pay | Admitting: Physician Assistant

## 2023-03-20 DIAGNOSIS — E7800 Pure hypercholesterolemia, unspecified: Secondary | ICD-10-CM

## 2023-03-29 NOTE — Progress Notes (Unsigned)
 Established patient visit  Patient: Kayla Shah   DOB: 11/02/1955   68 y.o. Female  MRN: [id number] Visit Date: 03/31/2023  Today's healthcare provider: Debera Lat, PA-C   No chief complaint on file.  Subjective       Discussed the use of AI scribe software for clinical note transcription with the patient, who gave verbal consent to proceed.  History of Present Illness               12/11/2022    1:50 PM 09/30/2022    9:32 AM 04/22/2022    8:18 AM  Depression screen PHQ 2/9  Decreased Interest 0 1 0  Down, Depressed, Hopeless 0 1 0  PHQ - 2 Score 0 2 0  Altered sleeping  1 0  Tired, decreased energy  0 0  Change in appetite  1 0  Feeling bad or failure about yourself   1 0  Trouble concentrating  0 0  Moving slowly or fidgety/restless  1 0  Suicidal thoughts  0 0  PHQ-9 Score  6 0  Difficult doing work/chores  Very difficult Not difficult at all      09/30/2022    9:33 AM 04/22/2022    8:18 AM 10/15/2021    9:17 AM 09/10/2021    8:24 AM  GAD 7 : Generalized Anxiety Score  Nervous, Anxious, on Edge 1 0 0 1  Control/stop worrying 1 0 0 1  Worry too much - different things 1 0 0 1  Trouble relaxing 3 0 0 1  Restless 0 0 0 0  Easily annoyed or irritable 0 0 0 1  Afraid - awful might happen 0 0 0 0  Total GAD 7 Score 6 0 0 5  Anxiety Difficulty Somewhat difficult Not difficult at all Not difficult at all Not difficult at all    Medications: Outpatient Medications Prior to Visit  Medication Sig   diazepam (VALIUM) 5 MG tablet Take 5 mg by mouth 3 (three) times daily as needed. 1/2-2 tablet   estrogens, conjugated, ([name]) 1.25 MG tablet Take 1.25 mg by mouth daily.   lamoTRIgine (LAMICTAL) 100 MG tablet Take 1 tablet (100 mg total) by mouth 2 (two) times daily.   ondansetron (ZOFRAN) 4 MG tablet Take 1 tablet (4 mg total) by mouth every 8 (eight) hours as needed for nausea or vomiting.   oxyCODONE (OXY IR/ROXICODONE) 5 MG immediate release tablet Take 5  mg by mouth every 6 (six) hours as needed for severe pain (pain score 7-10).   pantoprazole (PROTONIX) 40 MG tablet Take 1 tablet (40 mg total) by mouth daily.   rizatriptan (MAXALT-MLT) 10 MG disintegrating tablet Take 1 tablet (10 mg total) by mouth as needed for migraine. May repeat in 2 hours if needed   simvastatin (ZOCOR) 80 MG tablet TAKE 1 TABLET BY MOUTH DAILY   [name] 40 MG tablet TAKE 1 TABLET BY MOUTH DAILY   verapamil (CALAN-SR) 240 MG CR tablet TAKE 1 TABLET BY MOUTH DAILY   vitamin B-12 (CYANOCOBALAMIN) 500 MCG tablet Take 500 mcg by mouth daily.   No facility-administered medications prior to visit.    Review of Systems  Constitutional:  Positive for appetite change.   All negative Except see HPI   {Insert previous labs (optional):23779} {See past labs  Heme  Chem  Endocrine  Serology  Results Review (optional):1}   Objective    There were no vitals taken for this visit. {Insert last BP/Wt (optional):23777}{See vitals  history (optional):1}   Physical Exam Vitals reviewed.  Constitutional:      General: She is not in acute distress.    Appearance: Normal appearance. She is well-developed. She is not ill-appearing, toxic-appearing or diaphoretic.  HENT:     Head: Normocephalic and atraumatic.     Right Ear: Tympanic membrane, ear canal and external ear normal.     Left Ear: Tympanic membrane, ear canal and external ear normal.     Nose: Nose normal. No congestion or rhinorrhea.     Mouth/Throat:     Mouth: Mucous membranes are moist.     Pharynx: Oropharynx is clear. No oropharyngeal exudate.  Eyes:     General: No scleral icterus.       Right eye: No discharge.        Left eye: No discharge.     Conjunctiva/sclera: Conjunctivae normal.     Pupils: Pupils are equal, round, and reactive to light.  Neck:     Thyroid: No thyromegaly.     Vascular: No carotid bruit.  Cardiovascular:     Rate and Rhythm: Normal rate and regular rhythm.      Pulses: Normal pulses.     Heart sounds: Normal heart sounds. No murmur heard.    No friction rub. No gallop.  Pulmonary:     Effort: Pulmonary effort is normal. No respiratory distress.     Breath sounds: Normal breath sounds. No wheezing or rales.  Abdominal:     General: Abdomen is flat. Bowel sounds are normal. There is no distension.     Palpations: Abdomen is soft. There is no mass.     Tenderness: There is no abdominal tenderness. There is no right CVA tenderness, left CVA tenderness, guarding or rebound.     Hernia: No hernia is present.  Musculoskeletal:        General: No swelling, tenderness, deformity or signs of injury. Normal range of motion.     Cervical back: Normal range of motion and neck supple. No rigidity or tenderness.     Right lower leg: No edema.     Left lower leg: No edema.  Lymphadenopathy:     Cervical: No cervical adenopathy.  Skin:    General: Skin is warm and dry.     Coloration: Skin is not jaundiced or pale.     Findings: No bruising, erythema, lesion or rash.  Neurological:     Mental Status: She is alert and oriented to person, place, and time. Mental status is at baseline.     Gait: Gait normal.  Psychiatric:        Mood and Affect: Mood normal.        Behavior: Behavior normal.        Thought Content: Thought content normal.        Judgment: Judgment normal.      No results found for any visits on 03/31/23.      Assessment and Plan             No orders of the defined types were placed in this encounter.   No follow-ups on file.   The patient was advised to call back or seek an in-person evaluation if the symptoms worsen or if the condition fails to improve as anticipated.  I discussed the assessment and treatment plan with the patient. The patient was provided an opportunity to ask questions and all were answered. The patient agreed with the plan and demonstrated an understanding of the instructions.  I, Debera Lat, PA-C  have reviewed all documentation for this visit. The documentation on 03/31/2023  for the exam, diagnosis, procedures, and orders are all accurate and complete.  Debera Lat, [facility], MMS [facility] [phone] (phone) [phone] (fax)  [facility]

## 2023-03-31 ENCOUNTER — Ambulatory Visit (INDEPENDENT_AMBULATORY_CARE_PROVIDER_SITE_OTHER): Payer: 59 | Admitting: Physician Assistant

## 2023-03-31 VITALS — BP 145/92 | HR 78 | Wt 121.5 lb

## 2023-03-31 DIAGNOSIS — M810 Age-related osteoporosis without current pathological fracture: Secondary | ICD-10-CM

## 2023-03-31 DIAGNOSIS — R5383 Other fatigue: Secondary | ICD-10-CM

## 2023-03-31 DIAGNOSIS — I10 Essential (primary) hypertension: Secondary | ICD-10-CM

## 2023-03-31 DIAGNOSIS — M5416 Radiculopathy, lumbar region: Secondary | ICD-10-CM

## 2023-03-31 DIAGNOSIS — J449 Chronic obstructive pulmonary disease, unspecified: Secondary | ICD-10-CM

## 2023-03-31 DIAGNOSIS — I5022 Chronic systolic (congestive) heart failure: Secondary | ICD-10-CM

## 2023-03-31 DIAGNOSIS — F17210 Nicotine dependence, cigarettes, uncomplicated: Secondary | ICD-10-CM

## 2023-03-31 DIAGNOSIS — E7800 Pure hypercholesterolemia, unspecified: Secondary | ICD-10-CM

## 2023-03-31 DIAGNOSIS — Z780 Asymptomatic menopausal state: Secondary | ICD-10-CM

## 2023-03-31 DIAGNOSIS — M25551 Pain in right hip: Secondary | ICD-10-CM

## 2023-03-31 DIAGNOSIS — E538 Deficiency of other specified B group vitamins: Secondary | ICD-10-CM

## 2023-03-31 DIAGNOSIS — R569 Unspecified convulsions: Secondary | ICD-10-CM

## 2023-03-31 DIAGNOSIS — Z636 Dependent relative needing care at home: Secondary | ICD-10-CM

## 2023-04-02 ENCOUNTER — Encounter: Payer: Self-pay | Admitting: Physician Assistant

## 2023-04-02 DIAGNOSIS — M25551 Pain in right hip: Secondary | ICD-10-CM | POA: Insufficient documentation

## 2023-04-02 DIAGNOSIS — G8929 Other chronic pain: Secondary | ICD-10-CM | POA: Insufficient documentation

## 2023-04-07 ENCOUNTER — Telehealth: Payer: Self-pay | Admitting: Physician Assistant

## 2023-04-07 NOTE — Telephone Encounter (Signed)
 Copied from CRM [id number]. Topic: General - Other >> Apr 07, 2023  8:35 AM Alfred Levins wrote: Patient Kayla Shah said, "If I stop asking about the mediation estrogens, conjugated, ([name]) 1.25 MG tablet, do I still need to get a GYN provider, because I do not want another doctor".  Please advise.

## 2023-04-22 ENCOUNTER — Other Ambulatory Visit: Payer: 59

## 2023-05-05 ENCOUNTER — Encounter: Payer: 59 | Admitting: Certified Nurse Midwife

## 2023-05-09 ENCOUNTER — Other Ambulatory Visit: Payer: Self-pay | Admitting: Physician Assistant

## 2023-05-11 NOTE — Telephone Encounter (Signed)
 Requested Prescriptions  Pending Prescriptions Disp Refills   [name] 40 MG tablet [Pharmacy Med Name: VALSARTAN 40 MG TAB] 90 tablet 0    Sig: TAKE 1 TABLET BY MOUTH DAILY     Cardiovascular:  Angiotensin Receptor Blockers Failed - 05/11/2023  1:40 PM      Failed - Cr in normal range and within 180 days    Creatinine  Date Value Ref Range Status  08/28/2013 1.27 0.60 - 1.30 mg/dL Final   Creatinine, Ser  Date Value Ref Range Status  12/11/2022 1.03 (H) 0.57 - 1.00 mg/dL Final         Failed - Last BP in normal range    BP Readings from Last 1 Encounters:  03/31/23 (!) 145/92         Passed - K in normal range and within 180 days    Potassium  Date Value Ref Range Status  12/11/2022 4.0 3.5 - 5.2 mmol/L Final  08/28/2013 3.5 3.5 - 5.1 mmol/L Final         Passed - Patient is not pregnant      Passed - Valid encounter within last 6 months    Recent Outpatient Visits           1 month ago Pure hypercholesterolemia   [facility] [place], [place], PA-C       Future Appointments             In 1 month Ostwalt, Edmon Crape, PA-C [facility]

## 2023-05-12 ENCOUNTER — Encounter: Payer: 59 | Admitting: Physician Assistant

## 2023-05-26 ENCOUNTER — Telehealth: Payer: Self-pay

## 2023-05-26 NOTE — Progress Notes (Deleted)
 Kayla Shah

## 2023-05-26 NOTE — Telephone Encounter (Signed)
 Copied from CRM [id number]. Topic: Referral - Question >> May 26, 2023  8:02 AM Georgeann Kindred wrote: Reason for CRM: Patient wants to know is PA-C Ostwalt, Janna will refer her to a pain clinic in [place]. Please contact patient at [phone].

## 2023-05-27 NOTE — Progress Notes (Signed)
 err

## 2023-05-29 ENCOUNTER — Telehealth: Payer: Self-pay

## 2023-05-29 NOTE — Telephone Encounter (Signed)
 Copied from CRM [id number]. Topic: Clinical - Medication Question >> May 29, 2023  3:07 PM Georgeann Kindred wrote: Reason for CRM: Patient is calling to get a prescription for ondansetron  (ZOFRAN ) 4 MG tablet and oxyCODONE  (OXY IR/ROXICODONE ) 5 MG immediate release tablet. As her pain management specialist will not be back in office until 06/05. Please contact patient at [phone].

## 2023-06-01 ENCOUNTER — Telehealth: Payer: Self-pay

## 2023-06-01 NOTE — Telephone Encounter (Signed)
 Kayla Shah

## 2023-06-07 DIAGNOSIS — G894 Chronic pain syndrome: Secondary | ICD-10-CM | POA: Insufficient documentation

## 2023-06-07 DIAGNOSIS — Z79899 Other long term (current) drug therapy: Secondary | ICD-10-CM | POA: Insufficient documentation

## 2023-06-07 DIAGNOSIS — M899 Disorder of bone, unspecified: Secondary | ICD-10-CM | POA: Insufficient documentation

## 2023-06-07 DIAGNOSIS — Z789 Other specified health status: Secondary | ICD-10-CM | POA: Insufficient documentation

## 2023-06-07 NOTE — Patient Instructions (Signed)

## 2023-06-07 NOTE — Progress Notes (Unsigned)
 PROVIDER NOTE: Interpretation of information contained herein should be left to medically-trained personnel. Specific patient instructions are provided elsewhere under "Patient Instructions" section of medical record. This document was created in part using AI and STT-dictation technology, any transcriptional errors that may result from this process are unintentional.  Patient: Kayla Shah  Service: E/M Encounter  Provider: Candi Chafe, MD  DOB: 1955/03/13  Delivery: Face-to-face  Specialty: Interventional Pain Management  MRN: [id number]  Setting: Ambulatory outpatient facility  Specialty designation: 09  Type: New Patient  Location: Outpatient office facility  PCP: Ostwalt, Janna, PA-C  DOS: 06/08/2023    Referring Prov.: Ostwalt, Janna, PA-C   Primary Reason(s) for Visit: Encounter for initial evaluation of one or more chronic problems (new to examiner) potentially causing chronic pain, and posing a threat to normal musculoskeletal function. (Level of risk: High) CC: No chief complaint on file.  HPI  Kayla Shah is a 68 y.o. year old, female patient, who comes for the first time to our practice referred by Ostwalt, Janna, PA-C for our initial evaluation of her chronic pain. She has COPD (chronic obstructive pulmonary disease) with chronic bronchitis (HCC); Essential hypertension; Pure hypercholesterolemia; Seizures (HCC); Gastroesophageal reflux disease without esophagitis; Osteoporosis; Anxiety and depression; CAD (coronary artery disease); Insomnia; Chronic bilateral low back pain without sciatica; Aortic atherosclerosis (HCC); Chronic HFrEF (heart failure with reduced ejection fraction) (HCC); Cigarette nicotine  dependence without complication; Status post total replacement of right hip; Vitamin B12 deficiency; Chronic pain of right knee; Caregiver stress; Lumbar radiculopathy, chronic; Pain of right hip; Chronic pain syndrome; Pharmacologic therapy; Disorder of skeletal system; and  Problems influencing health status on their problem list. Today she comes in for evaluation of her No chief complaint on file.  Pain Assessment: Location:     Radiating:   Onset:   Duration:   Quality:   Severity:  /10 (subjective, self-reported pain score)  Effect on ADL:   Timing:   Modifying factors:   BP:    HR:    Onset and Duration: {Hx; Onset and Duration:210120511} Cause of pain: {Hx; Cause:210120521} Severity: {Pain Severity:210120502} Timing: {Symptoms; Timing:210120501} Aggravating Factors: {Causes; Aggravating pain factors:210120507} Alleviating Factors: {Causes; Alleviating Factors:210120500} Associated Problems: {Hx; Associated problems:210120515} Quality of Pain: {Hx; Symptom quality or Descriptor:210120531} Previous Examinations or Tests: {Hx; Previous examinations or test:210120529} Previous Treatments: {Hx; Previous Treatment:210120503}  Kayla Shah is being evaluated for possible interventional pain management therapies for the treatment of her chronic pain.  Discussed the use of AI scribe software for clinical note transcription with the patient, who gave verbal consent to proceed.  History of Present Illness           ***  Kayla Shah has been informed that this initial visit was an evaluation only.  On the follow up appointment I will go over the results, including ordered tests and available interventional therapies. At that time she will have the opportunity to decide whether to proceed with offered therapies or not. In the event that Kayla Shah prefers avoiding interventional options, this will conclude our involvement in the case.  Medication management recommendations may be provided upon request.  Patient informed that diagnostic tests may be ordered to assist in identifying underlying causes, narrow the list of differential diagnoses and aid in determining candidacy for (or contraindications to) planned therapeutic interventions.  Historic  Controlled Substance Pharmacotherapy Review  PMP and historical list of controlled substances: Diazepam 5 Mg Table (60/mo) ; Oxycodone  Hcl (Ir) 5 Mg Tablet (120/mo) Most recently prescribed  opioid analgesics: Oxycodone  Hcl (Ir) 5 Mg Tablet, 1 tab PO QID MME/day: 30 mg/day  Historical Monitoring: The patient  reports current drug use. Drug: Marijuana. List of prior UDS Testing: Lab Results  Component Value Date   MDMA NONE DETECTED 09/16/2019   MDMA NONE DETECTED 05/01/2019   MDMA NONE DETECTED 06/12/2018   MDMA NONE DETECTED 11/01/2017   MDMA NEGATIVE 07/01/2012   MDMA NEGATIVE 02/04/2012   COCAINSCRNUR NONE DETECTED 09/16/2019   COCAINSCRNUR NONE DETECTED 05/01/2019   COCAINSCRNUR NONE DETECTED 06/12/2018   COCAINSCRNUR NONE DETECTED 11/01/2017   COCAINSCRNUR NEGATIVE 07/01/2012   COCAINSCRNUR NEGATIVE 02/04/2012   PCPSCRNUR NONE DETECTED 09/16/2019   PCPSCRNUR NONE DETECTED 05/01/2019   PCPSCRNUR NONE DETECTED 06/12/2018   PCPSCRNUR NONE DETECTED 11/01/2017   PCPSCRNUR NEGATIVE 07/01/2012   PCPSCRNUR NEGATIVE 02/04/2012   THCU POSITIVE (A) 09/16/2019   THCU POSITIVE (A) 05/01/2019   THCU POSITIVE (A) 06/12/2018   THCU POSITIVE (A) 11/01/2017   THCU POSITIVE 07/01/2012   THCU POSITIVE 02/04/2012   ETH <10 05/01/2019   ETH <10 06/12/2018   ETH <10 11/01/2017   Historical Background Evaluation: [place] PMP: PDMP reviewed during this encounter. Review of the past 24-months conducted.             PMP NARX Score Report:  Narcotic: 490 Sedative: 431 Stimulant: 000 [place] Department of public safety, offender search: Engineer, mining Information) Non-contributory Risk Assessment Profile: Aberrant behavior: None observed or detected today Risk factors for fatal opioid overdose: None identified today PMP NARX Overdose Risk Score: 170 Fatal overdose hazard ratio (HR): Calculation deferred Non-fatal overdose hazard ratio (HR): Calculation deferred Risk of opioid abuse or dependence: 0.7-3.0%  with doses <= 36 MME/day and 6.1-26% with doses >= 120 MME/day. Substance use disorder (SUD) risk level: See below Personal History of Substance Abuse (SUD-Substance use disorder):  Alcohol:    Illegal Drugs:    Rx Drugs:    ORT Risk Level calculation:    ORT Scoring interpretation table:  Score <3 = Low Risk for SUD  Score between 4-7 = Moderate Risk for SUD  Score >8 = High Risk for Opioid Abuse   PHQ-2 Depression Scale:  Total score:    PHQ-2 Scoring interpretation table: (Score and probability of major depressive disorder)  Score 0 = No depression  Score 1 = 15.4% Probability  Score 2 = 21.1% Probability  Score 3 = 38.4% Probability  Score 4 = 45.5% Probability  Score 5 = 56.4% Probability  Score 6 = 78.6% Probability   PHQ-9 Depression Scale:  Total score:    PHQ-9 Scoring interpretation table:  Score 0-4 = No depression  Score 5-9 = Mild depression  Score 10-14 = Moderate depression  Score 15-19 = Moderately severe depression  Score 20-27 = Severe depression (2.4 times higher risk of SUD and 2.89 times higher risk of overuse)   Pharmacologic Plan: As per protocol, I have not taken over any controlled substance management, pending the results of ordered tests and/or consults.            Initial impression: Pending review of available data and ordered tests.  Meds   Current Outpatient Medications:    diazepam (VALIUM) 5 MG tablet, Take 5 mg by mouth 3 (three) times daily as needed. 1/2-2 tablet, Disp: , Rfl:    estrogens , conjugated, ([name] ) 1.25 MG tablet, Take 1.25 mg by mouth daily. (Patient not taking: Reported on 03/31/2023), Disp: , Rfl:    lamoTRIgine  (LAMICTAL ) 100 MG tablet, Take 1  tablet (100 mg total) by mouth 2 (two) times daily., Disp: 180 tablet, Rfl: 3   ondansetron  (ZOFRAN ) 4 MG tablet, Take 1 tablet (4 mg total) by mouth every 8 (eight) hours as needed for nausea or vomiting., Disp: 20 tablet, Rfl: 0   oxyCODONE  (OXY IR/ROXICODONE ) 5 MG immediate  release tablet, Take 5 mg by mouth every 6 (six) hours as needed for severe pain (pain score 7-10)., Disp: , Rfl:    pantoprazole  (PROTONIX ) 40 MG tablet, Take 1 tablet (40 mg total) by mouth daily., Disp: 90 tablet, Rfl: 3   rizatriptan  (MAXALT -MLT) 10 MG disintegrating tablet, Take 1 tablet (10 mg total) by mouth as needed for migraine. May repeat in 2 hours if needed, Disp: 9 tablet, Rfl: 11   simvastatin  (ZOCOR ) 80 MG tablet, TAKE 1 TABLET BY MOUTH DAILY, Disp: 30 tablet, Rfl: 1   valsartan  (DIOVAN ) 40 MG tablet, TAKE 1 TABLET BY MOUTH DAILY, Disp: 90 tablet, Rfl: 0   verapamil  (CALAN -SR) 240 MG CR tablet, TAKE 1 TABLET BY MOUTH DAILY, Disp: 90 tablet, Rfl: 0   vitamin B-12 (CYANOCOBALAMIN ) 500 MCG tablet, Take 500 mcg by mouth daily., Disp: , Rfl:   Imaging Review  Lumbosacral Imaging: Lumbar MR wo contrast: Results for orders placed during the hospital encounter of 03/07/20 MR LUMBAR SPINE WO CONTRAST  Narrative CLINICAL DATA:  Low back pain. Right lower extremity pain extending from the hip to the knee.  EXAM: MRI LUMBAR SPINE WITHOUT CONTRAST  TECHNIQUE: Multiplanar, multisequence MR imaging of the lumbar spine was performed. No intravenous contrast was administered.  COMPARISON:  CT abdomen and pelvis 05/01/2019  FINDINGS: Segmentation:  Standard.  Alignment:  Trace facet mediated anterolisthesis of L4 on L5.  Vertebrae: Mild chronic T12 and L2 superior endplate compression fractures, unchanged. No acute fracture or suspicious marrow lesion. Mild marrow edema about the L4-5 facets bilaterally, degenerative in appearance.  Conus medullaris and cauda equina: Conus extends to the L1 level. Conus and cauda equina appear normal.  Paraspinal and other soft tissues: Unremarkable.  Disc levels:  Disc desiccation throughout the lumbar spine. Severe disc space narrowing at L3-4 and L5-S1 and mild narrowing at L4-5.  L1-2: Mild disc bulging and mild facet and ligamentum  flavum hypertrophy without stenosis.  L2-3: Mild disc bulging and mild facet and ligamentum flavum hypertrophy without stenosis.  L3-4: Circumferential disc bulging and mild-to-moderate facet and ligamentum flavum hypertrophy result in mild to moderate left greater than right lateral recess stenosis and mild spinal stenosis without neural foraminal stenosis.  L4-5: Anterolisthesis with mild bulging of uncovered disc and moderate to severe facet and ligamentum flavum hypertrophy result in moderate spinal stenosis and mild bilateral lateral recess stenosis without neural foraminal stenosis. Small synovial cysts posterior to the facet joints bilaterally, not in a position to cause neural impingement.  L5-S1: Disc bulging, endplate spurring, severe disc space height loss, and mild facet hypertrophy result in mild bilateral lateral recess stenosis and mild left greater than right neural foraminal stenosis without spinal stenosis.  IMPRESSION: 1. Multilevel lumbar disc and facet degeneration, most notable at L4-5 where there is moderate spinal stenosis. 2. Mild-to-moderate lateral recess stenosis and mild spinal stenosis at L3-4. 3. Mild lateral recess and neural foraminal stenosis at L5-S1.   Electronically Signed By: Aundra Lee M.D. On: 03/07/2020 17:12  Lumbar CT wo contrast: Results for orders placed during the hospital encounter of 04/28/20 CT Lumbar Spine Wo Contrast  Narrative CLINICAL DATA:  Low back pain.  Fall.  EXAM: CT LUMBAR SPINE WITHOUT CONTRAST  TECHNIQUE: Multidetector CT imaging of the lumbar spine was performed without intravenous contrast administration. Multiplanar CT image reconstructions were also generated.  COMPARISON:  Lumbar spine MRI 03/07/2020  FINDINGS: Segmentation: Normal  Alignment: Grade 1 anterolisthesis at L4-5.  Vertebrae: Mild chronic height loss at T12 and L2, unchanged. No acute fracture.  Paraspinal and other soft  tissues: Calcific aortic atherosclerosis.  Disc levels: L3-4: There is mild spinal canal narrowing due to disc bulge and ligamentum flavum infolding.  L4-5: Grade 1 anterolisthesis due to severe facet arthrosis. Right asymmetric disc bulge with moderate spinal canal stenosis and mild right foraminal stenosis. Extraforaminal component of the disc is in close proximity to the exiting right L4 nerve root (series 9, image 81).  L5-S1: Moderate facet hypertrophy with right asymmetric disc bulge and bilateral endplate spurring. Mild bilateral foraminal stenosis no spinal canal stenosis.  IMPRESSION: 1. No acute fracture of the lumbar spine. 2. Grade 1 anterolisthesis at L4-5 due to severe facet arthrosis, which may be a source of local low back pain. 3. Moderate spinal canal stenosis and mild right foraminal stenosis at L4-5. Extraforaminal disc component is in close proximity to the exiting right L4 nerve root.  Aortic Atherosclerosis (ICD10-I70.0).   Electronically Signed By: Juanetta Nordmann M.D. On: 04/28/2020 19:51  Lumbar DG 2-3 views: Results for orders placed during the hospital encounter of 06/27/22 DG Lumbar Spine 2-3 Views  Narrative CLINICAL DATA:  Right low back pain over the last 3-4 months  EXAM: LUMBAR SPINE - 2-3 VIEW  COMPARISON:  CT lumbar spine 04/28/2020  FINDINGS: Stable mild superior endplate concavity at the L2 level. Stable 4 mm degenerative anterolisthesis at L4-5 with associated degenerative facet arthropathy bilaterally at L4-5 and also at L5-S1. Loss of disc height and disc desiccation noted at L3-4 and L5-S1 as on the prior exam.  Mild bony demineralization. Right total hip prosthesis. Atherosclerosis is present, including aortoiliac atherosclerotic disease.  No acute fracture or acute abnormality is identified.  IMPRESSION: 1. Lower lumbar spondylosis and degenerative disc disease with grade 1 degenerative anterolisthesis at L4-5. 2.  Stable mild superior endplate concavity at L2. 3. No acute findings. 4.  Aortic Atherosclerosis (ICD10-I70.0). 5. Right total hip prosthesis.   Electronically Signed By: Freida Jes M.D. On: 06/27/2022 11:11  Hip Imaging: Hip-R DG 2-3 views: Results for orders placed during the hospital encounter of 04/28/20 DG Hip Unilat W or Wo Pelvis 2-3 Views Right  Narrative CLINICAL DATA:  Marvell Slider, lower back pain, right lower extremity radiculopathy  EXAM: DG HIP (WITH OR WITHOUT PELVIS) 2-3V RIGHT  COMPARISON:  None.  FINDINGS: Frontal view of the pelvis as well as frontal and frogleg lateral views of the right hip are obtained. No acute fracture, subluxation, or dislocation. There is significant joint space narrowing within the right hip, with asymmetric sclerosis of the right femoral head. This may reflect avascular necrosis of femoral head and secondary osteoarthritis. Sacroiliac joints are normal. Soft tissues are unremarkable.  IMPRESSION: 1. No acute displaced fracture. 2. Asymmetric sclerosis along the right femoral head, with associated joint space narrowing. Favor chronic femoral head avascular necrosis and secondary osteoarthritis.   Electronically Signed By: Bobbye Burrow M.D. On: 04/28/2020 18:22  Complexity Note: Imaging results reviewed.                         ROS  Cardiovascular: {Hx; Cardiovascular History:210120525} Pulmonary or Respiratory: {Hx; Pumonary and/or Respiratory  History:210120523} Neurological: {Hx; Neurological:210120504} Psychological-Psychiatric: {Hx; Psychological-Psychiatric History:210120512} Gastrointestinal: {Hx; Gastrointestinal:210120527} Genitourinary: {Hx; Genitourinary:210120506} Hematological: {Hx; Hematological:210120510} Endocrine: {Hx; Endocrine history:210120509} Rheumatologic: {Hx; Rheumatological:210120530} Musculoskeletal: {Hx; Musculoskeletal:210120528} Work History: {Hx; Work history:210120514}  Allergies   Kayla Shah is allergic to codeine , other, fluoxetine, and paroxetine.  Laboratory Chemistry Profile   Renal Lab Results  Component Value Date   BUN 11 12/11/2022   CREATININE 1.03 (H) 12/11/2022   BCR 11 (L) 12/11/2022   GFR 59.88 (L) 08/05/2017   GFRAA 83 06/28/2019   GFRNONAA >60 11/20/2020   SPECGRAV >1.030 (H) 09/13/2020   PHUR 5.5 09/13/2020   PROTEINUR NEGATIVE 11/15/2020     Electrolytes Lab Results  Component Value Date   NA 140 12/11/2022   K 4.0 12/11/2022   CL 99 12/11/2022   CALCIUM 9.1 12/11/2022   MG 1.6 (L) 05/03/2019     Hepatic Lab Results  Component Value Date   AST 11 12/11/2022   ALT 7 12/11/2022   ALBUMIN 4.1 12/11/2022   ALKPHOS 70 12/11/2022   LIPASE 25 05/01/2019     ID Lab Results  Component Value Date   HIV Non Reactive 06/12/2018   SARSCOV2NAA NEGATIVE 11/15/2020   STAPHAUREUS NEGATIVE 11/15/2020   MRSAPCR NEGATIVE 11/15/2020   HCVAB NON REACTIVE 05/02/2019     Bone Lab Results  Component Value Date   VD25OH 47.4 06/22/2015     Endocrine Lab Results  Component Value Date   GLUCOSE 88 12/11/2022   GLUCOSEU NEGATIVE 11/15/2020   HGBA1C 5.3 12/11/2022   TSH 1.800 12/11/2022   FREET4 0.66 08/05/2017     Neuropathy Lab Results  Component Value Date   VITAMINB12 1,807 (H) 12/11/2022   FOLATE 8.3 08/30/2020   HGBA1C 5.3 12/11/2022   HIV Non Reactive 06/12/2018     CNS No results found for: "COLORCSF", "APPEARCSF", "RBCCOUNTCSF", "WBCCSF", "POLYSCSF", "LYMPHSCSF", "EOSCSF", "PROTEINCSF", "GLUCCSF", "JCVIRUS", "CSFOLI", "IGGCSF", "LABACHR", "ACETBL"   Inflammation (CRP: Acute  ESR: Chronic) Lab Results  Component Value Date   ESRSEDRATE 12 08/28/2013   LATICACIDVEN 13.2 (HH) 11/01/2017     Rheumatology No results found for: "RF", "ANA", "LABURIC", "URICUR", "LYMEIGGIGMAB", "LYMEABIGMQN", "HLAB27"   Coagulation Lab Results  Component Value Date   INR 1.0 11/15/2020   LABPROT 13.5 11/15/2020   APTT 27 11/15/2020    PLT 231 12/11/2022     Cardiovascular Lab Results  Component Value Date   TROPONINI 0.79 (HH) 06/13/2018   HGB 12.5 12/11/2022   HCT 38.0 12/11/2022     Screening Lab Results  Component Value Date   SARSCOV2NAA NEGATIVE 11/15/2020   STAPHAUREUS NEGATIVE 11/15/2020   MRSAPCR NEGATIVE 11/15/2020   HCVAB NON REACTIVE 05/02/2019   HIV Non Reactive 06/12/2018     Cancer No results found for: "CEA", "CA125", "LABCA2"   Allergens No results found for: "ALMOND", "APPLE", "ASPARAGUS", "AVOCADO", "BANANA", "BARLEY", "BASIL", "BAYLEAF", "GREENBEAN", "LIMABEAN", "WHITEBEAN", "BEEFIGE", "REDBEET", "BLUEBERRY", "BROCCOLI", "CABBAGE", "MELON", "CARROT", "CASEIN", "CASHEWNUT", "CAULIFLOWER", "CELERY"     Note: Lab results reviewed.  PFSH  Drug: Kayla Shah  reports current drug use. Drug: Marijuana. Alcohol:  reports no history of alcohol use. Tobacco:  reports that she has been smoking cigarettes. She has a 45 pack-year smoking history. She has never used smokeless tobacco. Medical:  has a past medical history of Allergy, Anxiety, Arthritis, Cataract, Chicken pox, COPD (chronic obstructive pulmonary disease) (HCC), Depression, Family history of adverse reaction to anesthesia, GERD (gastroesophageal reflux disease), Headache, History of blood transfusion, Hypertension, Migraine, Osteoporosis, PONV (postoperative nausea and vomiting),  and Seizure (HCC). Family: family history includes Alcohol abuse in her father; Arthritis in her maternal grandmother, mother, and sister; Dementia in her sister; Depression in her mother; Diabetes in her father and paternal grandmother; Heart disease in her father and maternal grandmother; Hyperlipidemia in her maternal grandmother; Kidney disease in her sister; Osteoporosis in her sister; Stroke in her mother.  Past Surgical History:  Procedure Laterality Date   APPENDECTOMY  1984   CATARACT EXTRACTION, BILATERAL Bilateral    COLONOSCOPY WITH PROPOFOL  N/A  03/26/2017   Procedure: COLONOSCOPY WITH PROPOFOL ;  Surgeon: Luke Salaam, MD;  Location: [facility];  Service: Gastroenterology;  Laterality: N/A;   EYE SURGERY     HERNIA REPAIR     JOINT REPLACEMENT     TOTAL ABDOMINAL HYSTERECTOMY  1984   Total   TOTAL HIP ARTHROPLASTY Right 11/19/2020   Procedure: RIGHT TOTAL HIP ARTHROPLASTY ANTERIOR APPROACH;  Surgeon: Wes Hamman, MD;  Location: [facility] OR;  Service: Orthopedics;  Laterality: Right;  C-3   VENTRAL HERNIA REPAIR N/A 09/16/2019   Procedure: HERNIA REPAIR VENTRAL ADULT, Open;  Surgeon: Mercy Stall, MD;  Location: [facility] ORS;  Service: General;  Laterality: N/A;   WISDOM TOOTH EXTRACTION     Active Ambulatory Problems    Diagnosis Date Noted   COPD (chronic obstructive pulmonary disease) with chronic bronchitis (HCC) 08/14/2014   Essential hypertension 08/14/2014   Pure hypercholesterolemia 08/14/2014   Seizures (HCC) 06/07/2015   Gastroesophageal reflux disease without esophagitis 06/07/2015   Osteoporosis 02/27/2016   Anxiety and depression 07/30/2017   CAD (coronary artery disease) 07/30/2017   Insomnia 07/30/2017   Chronic bilateral low back pain without sciatica 02/17/2020   Aortic atherosclerosis (HCC) 05/01/2020   Chronic HFrEF (heart failure with reduced ejection fraction) (HCC) 08/19/2020   Cigarette nicotine  dependence without complication 08/30/2020   Status post total replacement of right hip 11/19/2020   Vitamin B12 deficiency 02/01/2021   Chronic pain of right knee 02/01/2021   Caregiver stress 09/10/2021   Lumbar radiculopathy, chronic 10/02/2022   Pain of right hip 04/02/2023   Chronic pain syndrome 06/07/2023   Pharmacologic therapy 06/07/2023   Disorder of skeletal system 06/07/2023   Problems influencing health status 06/07/2023   Resolved Ambulatory Problems    Diagnosis Date Noted   Abnormal brain scan 08/29/2013   Depression 08/29/2013   Migraine 06/07/2015   Smoker 06/07/2015   Primary  osteoarthritis of left elbow 10/11/2015   Personal history of tobacco use, presenting hazards to health 10/23/2015   Agoraphobia 07/30/2017   Hot flashes due to surgical menopause 07/30/2017   Prediabetes 07/30/2017   Lung nodule 10/08/2017   Elevated troponin 06/12/2018   Intractable nausea and vomiting 05/01/2019   Hypokalemia 05/01/2019   Underweight 06/02/2019   Ventral hernia without obstruction or gangrene 08/30/2019   Status post repair of ventral hernia 10/13/2019   Encounter to establish care 11/11/2019   Elevated glucose level 11/11/2019   Encounter for screening mammogram for malignant neoplasm of breast 11/11/2019   Nausea 12/08/2019   Annual physical exam 02/17/2020   Severe episode of recurrent major depressive disorder, without psychotic features (HCC) 05/31/2020   Osteoarthritis of right hip 08/30/2020   Urinary frequency 09/13/2020   Avascular necrosis of right femoral head (HCC) 10/30/2020   Neck swelling 07/02/2021   Rash 07/02/2021   Past Medical History:  Diagnosis Date   Allergy    Anxiety    Arthritis    Cataract    Chicken pox  COPD (chronic obstructive pulmonary disease) (HCC)    Family history of adverse reaction to anesthesia    GERD (gastroesophageal reflux disease)    Headache    History of blood transfusion    Hypertension    PONV (postoperative nausea and vomiting)    Seizure (HCC)    Constitutional Exam  General appearance: Well nourished, well developed, and well hydrated. In no apparent acute distress There were no vitals filed for this visit. BMI Assessment: Estimated body mass index is 20.22 kg/m as calculated from the following:   Height as of 01/14/23: 5\' 5"  (1.651 m).   Weight as of 03/31/23: 121 lb 8 oz (55.1 kg).  BMI interpretation table: BMI level Category Range association with higher incidence of chronic pain  <18 kg/m2 Underweight   18.5-24.9 kg/m2 Ideal body weight   25-29.9 kg/m2 Overweight Increased incidence by 20%   30-34.9 kg/m2 Obese (Class I) Increased incidence by 68%  35-39.9 kg/m2 Severe obesity (Class II) Increased incidence by 136%  >40 kg/m2 Extreme obesity (Class III) Increased incidence by 254%   Patient's current BMI Ideal Body weight  There is no height or weight on file to calculate BMI. Patient weight not recorded   BMI Readings from Last 4 Encounters:  03/31/23 20.22 kg/m  01/14/23 20.80 kg/m  01/13/23 20.35 kg/m  12/11/22 20.25 kg/m   Wt Readings from Last 4 Encounters:  03/31/23 121 lb 8 oz (55.1 kg)  01/14/23 125 lb (56.7 kg)  01/13/23 122 lb 4.8 oz (55.5 kg)  12/11/22 118 lb (53.5 kg)    Psych/Mental status: Alert, oriented x 3 (person, place, & time)       Eyes: PERLA Respiratory: No evidence of acute respiratory distress  Assessment  Primary Diagnosis & Pertinent Problem List: The primary encounter diagnosis was Chronic pain syndrome. Diagnoses of Pharmacologic therapy, Disorder of skeletal system, and Problems influencing health status were also pertinent to this visit.  Visit Diagnosis (New problems to examiner): 1. Chronic pain syndrome   2. Pharmacologic therapy   3. Disorder of skeletal system   4. Problems influencing health status    Plan of Care (Initial workup plan)  Note: Kayla Shah was reminded that as per protocol, today's visit has been an evaluation only. We have not taken over the patient's controlled substance management.  Problem-specific plan: Assessment and Plan            Lab Orders  No laboratory test(s) ordered today   Imaging Orders  No imaging studies ordered today   Referral Orders  No referral(s) requested today   Procedure Orders    No procedure(s) ordered today   Pharmacotherapy (current): Medications ordered:  No orders of the defined types were placed in this encounter.  Medications administered during this visit: Kayla Shah had no medications administered during this visit.   Analgesic  Pharmacotherapy:  Opioid Analgesics: For patients currently taking or requesting to take opioid analgesics, in accordance with [place]  Medical Board Guidelines, we will assess their risks and indications for the use of these substances. After completing our evaluation, we may offer recommendations, but we no longer take patients for medication management. The prescribing physician will ultimately decide, based on his/her training and level of comfort whether to adopt any of the recommendations, including whether or not to prescribe such medicines.  Membrane stabilizer: To be determined at a later time  Muscle relaxant: To be determined at a later time  NSAID: To be determined at a later time  Other analgesic(s): To be determined at a later time   Interventional management options: Kayla Shah was informed that there is no guarantee that she would be a candidate for interventional therapies. The decision will be based on the results of diagnostic studies, as well as Kayla Shah's risk profile.  Procedure(s) under consideration:  Pending results of ordered studies     Interventional Therapies  Risk Factors  Considerations  Medical Comorbidities:     Planned  Pending:      Under consideration:   Pending   Completed:   None at this time   Therapeutic  Palliative (PRN) options:   None established   Completed by other providers:   None reported     Provider-requested follow-up: No follow-ups on file.  Future Appointments  Date Time Provider Department Center  06/08/2023  8:20 AM Renaldo Caroli, MD [facility] None  06/10/2023  9:00 AM [facility]  06/10/2023  9:40 AM [facility]  06/15/2023  8:20 AM Ostwalt, Monalisa Angles, PA-C [facility]  12/16/2023 10:50 AM [facility]AL WELLNESS VISIT [facility]  01/18/2024 11:00 AM Lomax, [name], NP [facility] None   I discussed the assessment and treatment plan with the patient. The patient was provided an opportunity  to ask questions and all were answered. The patient agreed with the plan and demonstrated an understanding of the instructions.  Patient advised to call back or seek an in-person evaluation if the symptoms or condition worsens.  Duration of encounter: *** minutes.  Total time on encounter, as per AMA guidelines included both the face-to-face and non-face-to-face time personally spent by the physician and/or other qualified health care professional(s) on the day of the encounter (includes time in activities that require the physician or other qualified health care professional and does not include time in activities normally performed by clinical staff). Physician's time may include the following activities when performed: Preparing to see the patient (e.g., pre-charting review of records, searching for previously ordered imaging, lab work, and nerve conduction tests) Review of prior analgesic pharmacotherapies. Reviewing PMP Interpreting ordered tests (e.g., lab work, imaging, nerve conduction tests) Performing post-procedure evaluations, including interpretation of diagnostic procedures Obtaining and/or reviewing separately obtained history Performing a medically appropriate examination and/or evaluation Counseling and educating the patient/family/caregiver Ordering medications, tests, or procedures Referring and communicating with other health care professionals (when not separately reported) Documenting clinical information in the electronic or other health record Independently interpreting results (not separately reported) and communicating results to the patient/ family/caregiver Care coordination (not separately reported)  Note by: Candi Chafe, MD (TTS and AI technology used. I apologize for any typographical errors that were not detected and corrected.) Date: 06/08/2023; Time: 10:45 AM

## 2023-06-08 ENCOUNTER — Encounter: Payer: Self-pay | Admitting: Pain Medicine

## 2023-06-08 ENCOUNTER — Ambulatory Visit: Attending: Pain Medicine | Admitting: Pain Medicine

## 2023-06-08 VITALS — BP 122/56 | HR 77 | Temp 97.3°F | Resp 16 | Ht 65.0 in | Wt 125.0 lb

## 2023-06-08 DIAGNOSIS — G894 Chronic pain syndrome: Secondary | ICD-10-CM

## 2023-06-08 DIAGNOSIS — Z79891 Long term (current) use of opiate analgesic: Secondary | ICD-10-CM | POA: Diagnosis present

## 2023-06-08 DIAGNOSIS — M25562 Pain in left knee: Secondary | ICD-10-CM | POA: Diagnosis present

## 2023-06-08 DIAGNOSIS — Z79899 Other long term (current) drug therapy: Secondary | ICD-10-CM | POA: Diagnosis present

## 2023-06-08 DIAGNOSIS — R892 Abnormal level of other drugs, medicaments and biological substances in specimens from other organs, systems and tissues: Secondary | ICD-10-CM | POA: Diagnosis not present

## 2023-06-08 DIAGNOSIS — F1291 Cannabis use, unspecified, in remission: Secondary | ICD-10-CM | POA: Diagnosis present

## 2023-06-08 DIAGNOSIS — G8929 Other chronic pain: Secondary | ICD-10-CM

## 2023-06-08 DIAGNOSIS — R937 Abnormal findings on diagnostic imaging of other parts of musculoskeletal system: Secondary | ICD-10-CM

## 2023-06-08 DIAGNOSIS — M899 Disorder of bone, unspecified: Secondary | ICD-10-CM | POA: Diagnosis not present

## 2023-06-08 DIAGNOSIS — Z789 Other specified health status: Secondary | ICD-10-CM | POA: Diagnosis present

## 2023-06-08 NOTE — Progress Notes (Signed)
 Safety precautions to be maintained throughout the outpatient stay will include: orient to surroundings, keep bed in low position, maintain call bell within reach at all times, provide assistance with transfer out of bed and ambulation.

## 2023-06-09 ENCOUNTER — Other Ambulatory Visit: Payer: Self-pay | Admitting: Physician Assistant

## 2023-06-09 DIAGNOSIS — I10 Essential (primary) hypertension: Secondary | ICD-10-CM

## 2023-06-09 DIAGNOSIS — E7800 Pure hypercholesterolemia, unspecified: Secondary | ICD-10-CM

## 2023-06-09 MED ORDER — ONDANSETRON HCL 4 MG PO TABS: 4.0000 mg | ORAL_TABLET | Freq: Three times a day (TID) | ORAL | 0 refills | Status: DC | PRN

## 2023-06-09 NOTE — Telephone Encounter (Signed)
 Copied from CRM [id number]. Topic: Clinical - Medication Refill >> Jun 09, 2023 11:16 AM Phil Braun wrote: Most Recent Primary Care Visit:  Provider: OSTWALT, JANNA  Department[facility]  Visit Type: OFFICE VISIT  Date: 03/31/2023  Medication: ondansetron  (ZOFRAN ) 4 MG tablet  Has the patient contacted their pharmacy? Yes   Is this the correct pharmacy for this prescription?   This is the patient's preferred pharmacy:   [facility] - [place], [place] - [address] [place] [address] Phone: [phone] Fax: [phone]    Has the prescription been filled recently? Yes  Is the patient out of the medication? Yes  Has the patient been seen for an appointment in the last year OR does the patient have an upcoming appointment? Yes  Can we respond through MyChart? No  Agent: Please be advised that Rx refills may take up to 3 business days. We ask that you follow-up with your pharmacy.

## 2023-06-10 ENCOUNTER — Other Ambulatory Visit

## 2023-06-10 ENCOUNTER — Encounter

## 2023-06-15 ENCOUNTER — Ambulatory Visit: Admitting: Physician Assistant

## 2023-06-30 ENCOUNTER — Other Ambulatory Visit: Payer: Self-pay | Admitting: Physician Assistant

## 2023-07-13 ENCOUNTER — Ambulatory Visit: Admitting: Physician Assistant

## 2023-08-24 ENCOUNTER — Ambulatory Visit (INDEPENDENT_AMBULATORY_CARE_PROVIDER_SITE_OTHER): Admitting: Physician Assistant

## 2023-08-24 VITALS — BP 144/83 | HR 61 | Resp 16 | Ht 65.0 in | Wt 118.9 lb

## 2023-08-24 DIAGNOSIS — F17210 Nicotine dependence, cigarettes, uncomplicated: Secondary | ICD-10-CM

## 2023-08-24 DIAGNOSIS — J449 Chronic obstructive pulmonary disease, unspecified: Secondary | ICD-10-CM

## 2023-08-24 DIAGNOSIS — G894 Chronic pain syndrome: Secondary | ICD-10-CM

## 2023-08-24 DIAGNOSIS — R569 Unspecified convulsions: Secondary | ICD-10-CM

## 2023-08-24 DIAGNOSIS — I5022 Chronic systolic (congestive) heart failure: Secondary | ICD-10-CM

## 2023-08-24 DIAGNOSIS — I10 Essential (primary) hypertension: Secondary | ICD-10-CM

## 2023-08-24 DIAGNOSIS — E7800 Pure hypercholesterolemia, unspecified: Secondary | ICD-10-CM

## 2023-08-24 NOTE — Progress Notes (Signed)
 " Established patient visit  Patient: Kayla Shah [facility]   DOB: [birth year]   68 y.o. Female  MRN: [id number] Visit Date: 08/24/2023  Today's healthcare provider: Jolynn Spencer, PA-C   Chief Complaint  Patient presents with   Follow-up    F/u no other concerns   Subjective     HPI     Follow-up    Additional comments: F/u no other concerns      Last edited by Marylen Odella CROME, CMA on 08/24/2023  1:15 PM.       Discussed the use of AI scribe software for clinical note transcription with the patient, who gave verbal consent to proceed.  History of Present Illness Kayla Shah is a 68 year old female who presents for management of chronic pain and follow-up on her cholesterol medication.  She experiences chronic pain and is dissatisfied with previous pain management, including ineffective treatments at a pain clinic and 5 mg Percocet. She seeks alternatives that do not involve injections. She continues simvastatin  for cholesterol management. She has not completed a recent bone density test or colonoscopy, with the last colonoscopy in 2019.  She has a history of seizures but has not had one in years. She does not drive due to this history, affecting her ability to attend appointments. She experiences night sweats but has not sought OBGYN care. Occasional anxiety is attributed to work and family stress. She believes quitting smoking would help alleviate symptoms. She has not measured her blood pressure recently and prioritizes other responsibilities over her health needs. No chest pain, shortness of breath, rapid heart rate, or cough. No inhaler use.       08/24/2023    1:23 PM 06/08/2023    8:48 AM 03/31/2023    9:58 AM  Depression screen PHQ 2/9  Decreased Interest 0 0 0  Down, Depressed, Hopeless 0 0 0  PHQ - 2 Score 0 0 0  Altered sleeping 0  0  Tired, decreased energy 0  0  Change in appetite 0  0  Feeling bad or failure about yourself  0  0  Trouble concentrating 0  0   Moving slowly or fidgety/restless 0  0  Suicidal thoughts 0  0  PHQ-9 Score 0  0  Difficult doing work/chores Not difficult at all  Not difficult at all      08/24/2023    1:23 PM 03/31/2023    9:58 AM 09/30/2022    9:33 AM 04/22/2022    8:18 AM  GAD 7 : Generalized Anxiety Score  Nervous, Anxious, on Edge 3 0 1 0  Control/stop worrying 1 0 1 0  Worry too much - different things 1 0 1 0  Trouble relaxing 0 0 3 0  Restless 0 0 0 0  Easily annoyed or irritable 1 0 0 0  Afraid - awful might happen 0 0 0 0  Total GAD 7 Score 6 0 6 0  Anxiety Difficulty Not difficult at all Not difficult at all Somewhat difficult Not difficult at all    Medications: Outpatient Medications Prior to Visit  Medication Sig   lamoTRIgine  (LAMICTAL ) 100 MG tablet Take 1 tablet (100 mg total) by mouth 2 (two) times daily.   ondansetron  (ZOFRAN ) 4 MG tablet TAKE 1 TABLET BY MOUTH EVERY 8 HOURS AS NEEDED FOR NAUSEA OR VOMITING   rizatriptan  (MAXALT -MLT) 10 MG disintegrating tablet Take 1 tablet (10 mg total) by mouth as needed for migraine. May repeat in 2 hours if needed  simvastatin  (ZOCOR ) 80 MG tablet TAKE 1 TABLET BY MOUTH DAILY   valsartan  (DIOVAN ) 40 MG tablet TAKE 1 TABLET BY MOUTH DAILY   verapamil  (CALAN -SR) 240 MG CR tablet TAKE 1 TABLET BY MOUTH DAILY   vitamin B-12 (CYANOCOBALAMIN ) 500 MCG tablet Take 500 mcg by mouth daily.   pantoprazole  (PROTONIX ) 40 MG tablet Take 1 tablet (40 mg total) by mouth daily. (Patient not taking: Reported on 08/24/2023)   No facility-administered medications prior to visit.    Review of Systems All negative Except see HPI       Objective    BP (!) 148/83 (BP Location: Right Arm, Patient Position: Sitting, Cuff Size: Normal)   Pulse 74   Resp 16   Ht 5' 5 (1.651 m)   Wt 118 lb 14.4 oz (53.9 kg)   SpO2 95%   BMI 19.79 kg/m     Physical Exam Vitals reviewed.  Constitutional:      General: She is not in acute distress.    Appearance: Normal  appearance. She is well-developed. She is not diaphoretic.  HENT:     Head: Normocephalic and atraumatic.  Eyes:     General: No scleral icterus.    Conjunctiva/sclera: Conjunctivae normal.  Neck:     Thyroid : No thyromegaly.  Cardiovascular:     Rate and Rhythm: Normal rate and regular rhythm.     Pulses: Normal pulses.     Heart sounds: Normal heart sounds. No murmur heard. Pulmonary:     Effort: Pulmonary effort is normal. No respiratory distress.     Breath sounds: Normal breath sounds. No wheezing, rhonchi or rales.  Musculoskeletal:     Cervical back: Neck supple.     Right lower leg: No edema.     Left lower leg: No edema.  Lymphadenopathy:     Cervical: No cervical adenopathy.  Skin:    General: Skin is warm and dry.     Findings: No rash.  Neurological:     Mental Status: She is alert and oriented to person, place, and time. Mental status is at baseline.  Psychiatric:        Mood and Affect: Mood normal.        Behavior: Behavior normal.      No results found for any visits on 08/24/23.      Assessment & Plan Nicotine  addiction Chronic Acknowledged addiction and health risks. Smoking cessation expected to improve health and reduce anxiety. - Encouraged continued smoking cessation efforts.  Chronic pain Chronic pain with ineffective past treatments. Prefers non-injection pain management and open to medication management clinics. - Discuss potential referral to a suitable pain management clinic. - Encourage exploration of patient reviews for pain management options.  Hypertension Chronic and unstable today Concern about elevated blood pressure.  Could be attributed to the chronic pain syndrome   plan to reassess and adjust medication if necessary. Possible cardiology referral if hypertension persists. Currently on valsartan  40, verapamil  240 - Recheck blood pressure during the visit. - Consider adding or adjusting antihypertensive medication based on  readings.  General Health Maintenance Incomplete health screenings. Aware of need but unable to prioritize due to obligations. - Encourage scheduling a bone density test. - Discuss timing of next colonoscopy based on previous results.  Follow-up Personal and family issues may affect follow-up attendance. Prefers follow-up in two months. - Schedule follow-up appointment in two months. - Monitor blood pressure and consider cardiology referral if hypertension persists.  Seizures (HCC) Hx of seizures, no  recent episodes Takes Lamictal  100 mg No driving Shah follow-up  Pure hypercholesterolemia Chronic Continue simvastatin  80 Mg daily Last therapy was Continue lifestyle modifications The 10-year ASCVD risk score (Arnett DK, et al., 2019) is: 18.2% Lab work from 03/31/2023 pending Last LDL was 77 from 12/11/2022 Shah follow-up  No orders of the defined types were placed in this encounter.   No follow-ups on file.   The patient was advised to call back or seek an in-person evaluation if the symptoms worsen or if the condition fails to improve as anticipated.  I discussed the assessment and treatment plan with the patient. The patient was provided an opportunity to ask questions and all were answered. The patient agreed with the plan and demonstrated an understanding of the instructions.  I, Shamari Trostel, PA-C have reviewed all documentation for this visit. The documentation on 08/24/2023  for the exam, diagnosis, procedures, and orders are all accurate and complete.  Jolynn Spencer, [facility], MMS [facility] [phone] (phone) [phone] (fax)  [facility] "

## 2023-08-28 ENCOUNTER — Encounter: Payer: Self-pay | Admitting: Physician Assistant

## 2023-09-09 ENCOUNTER — Other Ambulatory Visit: Payer: Self-pay | Admitting: Physician Assistant

## 2023-09-15 ENCOUNTER — Other Ambulatory Visit: Payer: Self-pay | Admitting: Physician Assistant

## 2023-09-15 DIAGNOSIS — E7800 Pure hypercholesterolemia, unspecified: Secondary | ICD-10-CM

## 2023-09-22 ENCOUNTER — Other Ambulatory Visit: Payer: Self-pay | Admitting: Physician Assistant

## 2023-09-22 DIAGNOSIS — I10 Essential (primary) hypertension: Secondary | ICD-10-CM

## 2023-10-26 ENCOUNTER — Ambulatory Visit: Admitting: Physician Assistant

## 2023-11-05 ENCOUNTER — Ambulatory Visit: Admitting: Physician Assistant

## 2023-11-12 NOTE — Progress Notes (Deleted)
 Established patient visit  Patient: Kayla Shah   DOB: Apr 08, 1955   68 y.o. Female  MRN: [id number] Visit Date: 11/13/2023  Today's healthcare provider: Jolynn Spencer, PA-C   No chief complaint on file.  Subjective       Discussed the use of AI scribe software for clinical note transcription with the patient, who gave verbal consent to proceed.  History of Present Illness        08/24/2023    1:23 PM 06/08/2023    8:48 AM 03/31/2023    9:58 AM  Depression screen PHQ 2/9  Decreased Interest 0 0 0  Down, Depressed, Hopeless 0 0 0  PHQ - 2 Score 0 0 0  Altered sleeping 0  0  Tired, decreased energy 0  0  Change in appetite 0  0  Feeling bad or failure about yourself  0  0  Trouble concentrating 0  0  Moving slowly or fidgety/restless 0  0  Suicidal thoughts 0  0  PHQ-9 Score 0  0  Difficult doing work/chores Not difficult at all  Not difficult at all      08/24/2023    1:23 PM 03/31/2023    9:58 AM 09/30/2022    9:33 AM 04/22/2022    8:18 AM  GAD 7 : Generalized Anxiety Score  Nervous, Anxious, on Edge 3 0 1 0  Control/stop worrying 1 0 1 0  Worry too much - different things 1 0 1 0  Trouble relaxing 0 0 3 0  Restless 0 0 0 0  Easily annoyed or irritable 1 0 0 0  Afraid - awful might happen 0 0 0 0  Total GAD 7 Score 6 0 6 0  Anxiety Difficulty Not difficult at all Not difficult at all Somewhat difficult Not difficult at all    Medications: Outpatient Medications Prior to Visit  Medication Sig  . lamoTRIgine  (LAMICTAL ) 100 MG tablet Take 1 tablet (100 mg total) by mouth 2 (two) times daily.  . ondansetron  (ZOFRAN ) 4 MG tablet TAKE 1 TABLET BY MOUTH EVERY 8 HOURS AS NEEDED FOR NAUSEA OR VOMITING  . pantoprazole  (PROTONIX ) 40 MG tablet Take 1 tablet (40 mg total) by mouth daily. (Patient not taking: Reported on 08/24/2023)  . rizatriptan  (MAXALT -MLT) 10 MG disintegrating tablet Take 1 tablet (10 mg total) by mouth as needed for migraine. May repeat in 2 hours  if needed  . simvastatin  (ZOCOR ) 80 MG tablet TAKE 1 TABLET BY MOUTH DAILY  . valsartan  (DIOVAN ) 40 MG tablet TAKE 1 TABLET BY MOUTH DAILY  . verapamil  (CALAN -SR) 240 MG CR tablet TAKE 1 TABLET BY MOUTH DAILY  . vitamin B-12 (CYANOCOBALAMIN ) 500 MCG tablet Take 500 mcg by mouth daily.   No facility-administered medications prior to visit.    Review of Systems  All other systems reviewed and are negative.  All negative Except see HPI   {Insert previous labs (optional):23779} {See past labs  Heme  Chem  Endocrine  Serology  Results Review (optional):1}   Objective    There were no vitals taken for this visit. {Insert last BP/Wt (optional):23777}{See vitals history (optional):1}   Physical Exam Vitals reviewed.  Constitutional:      General: She is not in acute distress.    Appearance: Normal appearance. She is well-developed. She is not diaphoretic.  HENT:     Head: Normocephalic and atraumatic.  Eyes:     General: No scleral icterus.    Conjunctiva/sclera: Conjunctivae normal.  Neck:  Thyroid : No thyromegaly.  Cardiovascular:     Rate and Rhythm: Normal rate and regular rhythm.     Pulses: Normal pulses.     Heart sounds: Normal heart sounds. No murmur heard. Pulmonary:     Effort: Pulmonary effort is normal. No respiratory distress.     Breath sounds: Normal breath sounds. No wheezing, rhonchi or rales.  Musculoskeletal:     Cervical back: Neck supple.     Right lower leg: No edema.     Left lower leg: No edema.  Lymphadenopathy:     Cervical: No cervical adenopathy.  Skin:    General: Skin is warm and dry.     Findings: No rash.  Neurological:     Mental Status: She is alert and oriented to person, place, and time. Mental status is at baseline.  Psychiatric:        Mood and Affect: Mood normal.        Behavior: Behavior normal.     No results found for any visits on 11/13/23.      Assessment and Plan Assessment & Plan     No orders of the  defined types were placed in this encounter.   No follow-ups on file.   The patient was advised to call back or seek an in-person evaluation if the symptoms worsen or if the condition fails to improve as anticipated.  I discussed the assessment and treatment plan with the patient. The patient was provided an opportunity to ask questions and all were answered. The patient agreed with the plan and demonstrated an understanding of the instructions.  I, Kaliegh Willadsen, PA-C have reviewed all documentation for this visit. The documentation on 11/13/2023  for the exam, diagnosis, procedures, and orders are all accurate and complete.  Jolynn Spencer, [facility], MMS [facility] [phone] (phone) [phone] (fax)  [facility]

## 2023-11-13 ENCOUNTER — Ambulatory Visit: Admitting: Physician Assistant

## 2023-11-13 DIAGNOSIS — F17210 Nicotine dependence, cigarettes, uncomplicated: Secondary | ICD-10-CM

## 2023-11-13 DIAGNOSIS — G894 Chronic pain syndrome: Secondary | ICD-10-CM

## 2023-11-13 DIAGNOSIS — Z780 Asymptomatic menopausal state: Secondary | ICD-10-CM

## 2023-11-13 DIAGNOSIS — E7800 Pure hypercholesterolemia, unspecified: Secondary | ICD-10-CM

## 2023-11-13 DIAGNOSIS — R569 Unspecified convulsions: Secondary | ICD-10-CM

## 2023-11-13 DIAGNOSIS — I10 Essential (primary) hypertension: Secondary | ICD-10-CM

## 2023-11-16 ENCOUNTER — Other Ambulatory Visit: Payer: Self-pay | Admitting: Physician Assistant

## 2023-11-16 DIAGNOSIS — E7800 Pure hypercholesterolemia, unspecified: Secondary | ICD-10-CM

## 2023-11-20 ENCOUNTER — Ambulatory Visit: Admitting: Physician Assistant

## 2023-12-08 ENCOUNTER — Other Ambulatory Visit: Payer: Self-pay | Admitting: Physician Assistant

## 2023-12-15 ENCOUNTER — Other Ambulatory Visit: Payer: Self-pay | Admitting: Physician Assistant

## 2023-12-15 DIAGNOSIS — E7800 Pure hypercholesterolemia, unspecified: Secondary | ICD-10-CM

## 2023-12-15 NOTE — Telephone Encounter (Signed)
 Called pt regarding blood work from Feb that has not been done if pt could complete. Pt has an appt 12/28/23 and will have it done that day. I will send in a courtesy refill until appt due to blood work not being done for 1 year on Lipid panel. Pt verbalized understanding of courtesy refill.

## 2023-12-16 ENCOUNTER — Ambulatory Visit: Payer: 59

## 2023-12-17 ENCOUNTER — Ambulatory Visit: Payer: Self-pay | Admitting: *Deleted

## 2023-12-17 NOTE — Telephone Encounter (Signed)
 FYI Only or Action Required?: FYI only for provider: ED advised.  Patient was last seen in primary care on 08/24/2023 by Ostwalt, Janna, PA-C.  Called Nurse Triage reporting Vomiting.  Symptoms began yesterday.  Interventions attempted: Rest, hydration, or home remedies.  Symptoms are: unchanged.  Triage Disposition: Go to ED Now (or PCP Triage)  Patient/caregiver understands and will follow disposition?: Patient declines ED- requesting medictaion  Patient is requesting Zofran  for vomiting- patient states she does not drive and is not calling 088 for vomiting. Patient advised of s/sx of dehydration and advised I would inform her PCP.   Copied from CRM [id number]. Topic: Clinical - Red Word Triage >> Dec 17, 2023  8:15 AM Kayla Shah wrote: Red Word that prompted transfer to Nurse Triage: The patient is experiencing uncontrolled vomiting that began yesterday. Reason for Disposition  [1] MODERATE to SEVERE vomiting (e.g., 3 or more times/day) AND [2] age > 60 years  Answer Assessment - Initial Assessment Questions 1. VOMITING SEVERITY: How many times have you vomited in the past 24 hours?      10- did not sleep last night 2. ONSET: When did the vomiting begin?      yesterday 3. FLUIDS: What fluids or food have you vomited up today? Have you been able to keep any fluids down?     Not tried since 4 am 4. ABDOMEN PAIN: Are your having any abdomen pain? If Yes : How bad is it and what does it feel like? (e.g., crampy, dull, intermittent, constant)      no 5. DIARRHEA: Is there any diarrhea? If Yes, ask: How many times today?      no 6. CONTACTS: Is there anyone else in the family with the same symptoms?      no 7. CAUSE: What do you think is causing your vomiting?     Unsure- hx of n/v 8. HYDRATION STATUS: Any signs of dehydration? (e.g., dry mouth [not only dry lips], too weak to stand) When did you last urinate?     Patient states she is afraid she is headed  that way 9. OTHER SYMPTOMS: Do you have any other symptoms? (e.g., fever, headache, vertigo, vomiting blood or coffee grounds, recent head injury)     no  Protocols used: Vomiting-A-AH

## 2023-12-22 ENCOUNTER — Other Ambulatory Visit: Payer: Self-pay | Admitting: Physician Assistant

## 2023-12-22 NOTE — Telephone Encounter (Unsigned)
 Copied from CRM [id number]. Topic: Clinical - Medication Refill >> Dec 22, 2023  9:22 AM [name] wrote: Medication:  pantoprazole  (PROTONIX ) 40 MG tablet   Has the patient contacted their pharmacy? Yes (Agent: If no, request that the patient contact the pharmacy for the refill. If patient does not wish to contact the pharmacy document the reason why and proceed with request.) (Agent: If yes, when and what did the pharmacy advise?)  This is the patient's preferred pharmacy:  [facility] - [place], [place] - [address] [place] [address] Phone: [phone] Fax: [phone]  Is this the correct pharmacy for this prescription? Yes If no, delete pharmacy and type the correct one.   Has the prescription been filled recently? No  Is the patient out of the medication? Yes  Has the patient been seen for an appointment in the last year OR does the patient have an upcoming appointment? Yes  Can we respond through MyChart? No  Agent: Please be advised that Rx refills may take up to 3 business days. We ask that you follow-up with your pharmacy.

## 2023-12-24 MED ORDER — PANTOPRAZOLE SODIUM 40 MG PO TBEC
40.0000 mg | DELAYED_RELEASE_TABLET | Freq: Every day | ORAL | 0 refills | Status: AC
Start: 2023-12-24 — End: ?

## 2023-12-24 NOTE — Telephone Encounter (Signed)
 Requested Prescriptions  Pending Prescriptions Disp Refills   pantoprazole  (PROTONIX ) 40 MG tablet 90 tablet 0    Sig: Take 1 tablet (40 mg total) by mouth daily.     Gastroenterology: Proton Pump Inhibitors Passed - 12/24/2023  4:03 PM      Passed - Valid encounter within last 12 months    Recent Outpatient Visits           4 months ago Cigarette nicotine  dependence without complication   [facility] [place], Hilda, PA-C   8 months ago Pure hypercholesterolemia   [facility] [place], Janna, PA-C

## 2023-12-26 ENCOUNTER — Other Ambulatory Visit: Payer: Self-pay | Admitting: Physician Assistant

## 2023-12-26 DIAGNOSIS — E7800 Pure hypercholesterolemia, unspecified: Secondary | ICD-10-CM

## 2023-12-27 NOTE — Progress Notes (Deleted)
 Established patient visit  Patient: Kayla Shah [facility]   DOB: 09/15/1955   68 y.o. Female  MRN: [id number] Visit Date: 12/28/2023  Today's healthcare provider: Jolynn Spencer, PA-C   No chief complaint on file.  Subjective       Discussed the use of AI scribe software for clinical note transcription with the patient, who gave verbal consent to proceed.  History of Present Illness        08/24/2023    1:23 PM 06/08/2023    8:48 AM 03/31/2023    9:58 AM  Depression screen PHQ 2/9  Decreased Interest 0 0 0  Down, Depressed, Hopeless 0 0 0  PHQ - 2 Score 0 0 0  Altered sleeping 0  0  Tired, decreased energy 0  0  Change in appetite 0  0  Feeling bad or failure about yourself  0  0  Trouble concentrating 0  0  Moving slowly or fidgety/restless 0  0  Suicidal thoughts 0  0  PHQ-9 Score 0   0   Difficult doing work/chores Not difficult at all  Not difficult at all     Data saved with a previous flowsheet row definition      08/24/2023    1:23 PM 03/31/2023    9:58 AM 09/30/2022    9:33 AM 04/22/2022    8:18 AM  GAD 7 : Generalized Anxiety Score  Nervous, Anxious, on Edge 3 0 1 0  Control/stop worrying 1 0 1 0  Worry too much - different things 1 0 1 0  Trouble relaxing 0 0 3 0  Restless 0 0 0 0  Easily annoyed or irritable 1 0 0 0  Afraid - awful might happen 0 0 0 0  Total GAD 7 Score 6 0 6 0  Anxiety Difficulty Not difficult at all Not difficult at all Somewhat difficult Not difficult at all    Medications: Outpatient Medications Prior to Visit  Medication Sig  . lamoTRIgine  (LAMICTAL ) 100 MG tablet Take 1 tablet (100 mg total) by mouth 2 (two) times daily.  . ondansetron  (ZOFRAN ) 4 MG tablet TAKE 1 TABLET BY MOUTH EVERY 8 HOURS AS NEEDED FOR NAUSEA OR VOMITING  . pantoprazole  (PROTONIX ) 40 MG tablet Take 1 tablet (40 mg total) by mouth daily.  . rizatriptan  (MAXALT -MLT) 10 MG disintegrating tablet Take 1 tablet (10 mg total) by mouth as needed for migraine. May  repeat in 2 hours if needed  . simvastatin  (ZOCOR ) 80 MG tablet TAKE 1 TABLET BY MOUTH DAILY  . valsartan  (DIOVAN ) 40 MG tablet TAKE 1 TABLET BY MOUTH DAILY  . verapamil  (CALAN -SR) 240 MG CR tablet TAKE 1 TABLET BY MOUTH DAILY  . vitamin B-12 (CYANOCOBALAMIN ) 500 MCG tablet Take 500 mcg by mouth daily.   No facility-administered medications prior to visit.    Review of Systems  All other systems reviewed and are negative.  All negative Except see HPI   {Insert previous labs (optional):23779} {See past labs  Heme  Chem  Endocrine  Serology  Results Review (optional):1}   Objective    There were no vitals taken for this visit. {Insert last BP/Wt (optional):23777}{See vitals history (optional):1}   Physical Exam Vitals reviewed.  Constitutional:      General: She is not in acute distress.    Appearance: Normal appearance. She is well-developed. She is not diaphoretic.  HENT:     Head: Normocephalic and atraumatic.  Eyes:     General: No scleral icterus.  Conjunctiva/sclera: Conjunctivae normal.  Neck:     Thyroid : No thyromegaly.  Cardiovascular:     Rate and Rhythm: Normal rate and regular rhythm.     Pulses: Normal pulses.     Heart sounds: Normal heart sounds. No murmur heard. Pulmonary:     Effort: Pulmonary effort is normal. No respiratory distress.     Breath sounds: Normal breath sounds. No wheezing, rhonchi or rales.  Musculoskeletal:     Cervical back: Neck supple.     Right lower leg: No edema.     Left lower leg: No edema.  Lymphadenopathy:     Cervical: No cervical adenopathy.  Skin:    General: Skin is warm and dry.     Findings: No rash.  Neurological:     Mental Status: She is alert and oriented to person, place, and time. Mental status is at baseline.  Psychiatric:        Mood and Affect: Mood normal.        Behavior: Behavior normal.     No results found for any visits on 12/28/23.      Assessment and Plan Assessment &  Plan     No orders of the defined types were placed in this encounter.   No follow-ups on file.   The patient was advised to call back or seek an in-person evaluation if the symptoms worsen or if the condition fails to improve as anticipated.  I discussed the assessment and treatment plan with the patient. The patient was provided an opportunity to ask questions and all were answered. The patient agreed with the plan and demonstrated an understanding of the instructions.  I, Jaileen Janelle, PA-C have reviewed all documentation for this visit. The documentation on 12/28/2023  for the exam, diagnosis, procedures, and orders are all accurate and complete.  Jolynn Spencer, [facility], MMS [facility] [phone] (phone) [phone] (fax)  [facility]

## 2023-12-28 ENCOUNTER — Ambulatory Visit: Admitting: Physician Assistant

## 2023-12-28 DIAGNOSIS — E7800 Pure hypercholesterolemia, unspecified: Secondary | ICD-10-CM

## 2023-12-28 DIAGNOSIS — F17210 Nicotine dependence, cigarettes, uncomplicated: Secondary | ICD-10-CM

## 2023-12-28 DIAGNOSIS — G894 Chronic pain syndrome: Secondary | ICD-10-CM

## 2023-12-28 DIAGNOSIS — I10 Essential (primary) hypertension: Secondary | ICD-10-CM

## 2024-01-14 ENCOUNTER — Other Ambulatory Visit: Payer: Self-pay | Admitting: Physician Assistant

## 2024-01-14 DIAGNOSIS — E7800 Pure hypercholesterolemia, unspecified: Secondary | ICD-10-CM

## 2024-01-18 ENCOUNTER — Telehealth: Payer: Self-pay | Admitting: Family Medicine

## 2024-01-18 ENCOUNTER — Ambulatory Visit: Payer: 59 | Admitting: Family Medicine

## 2024-01-18 NOTE — Progress Notes (Deleted)
 PATIENT: Kayla Shah DOB: 09-May-1955  REASON FOR VISIT: follow up HISTORY FROM: patient  No chief complaint on file.    HISTORY OF PRESENT ILLNESS:  01/18/2024 ALL:  Kayla Shah returns for follow up for seizures and migraines. She was last seen 01/2023 and doing well on lamotrigine  and rizatriptan . Since,   01/14/2023 ALL:  Kayla Shah returns for follow up for seizures and migraines. She continues lamotrigine  100mg  BID and rizatriptan  as needed. She denies seizure activity and has had no migraines since last visit. She has not needed rizatriptan . She continues follow up with PCP. Labs with CPE unremarkable 12/2022. She continues to see ortho for chronic hip and low back pain.   10/23/2021 ALL:  Kayla Shah returns for follow up for seizures and migraines. She continues lamotrigine  100mg  twice daily. No seizure activity. She rarely has a headache. She has not used rizatriptan  recently. She has recovered well following hip replacement surgery.   09/19/2020 ALL: Kayla Shah returns for follow up for seizures and migraines. She reports doing very well. She continues lamotrigine  100mg  BID and rizatriptan  as needed. She rarely has headaches. She can not remember the last time she needed rizatriptan . She feels tremor is better off divalproex . Some action tremor, especially if anxious or tired. She denies seizure activity. She is having right hip pain. She is trying to find a surgeon who will consider hip replacement.   09/20/2019 ALL:  Kayla Shah is a 68 y.o. female here today for follow up for seizures and migraines. She reports doing well. She has tolerated lamotrigine  well. She feels that tremor has significantly improved since discontinuing divalproex . She can write again. She can hold a coffee cup. She feels that migraines are well managed. She can't remember the last time she took rizatriptan . She is very happy with current treatment regimen. She is also very proud to report that she recently  quit smoking. She is feeling better physically.   04/27/2019 ALL: Kayla Shah is a 68 y.o. female here today for follow up for seizures. She continues divalproex  750mg  twice daily. She reports that tremors have significantly worsened. She is unable to hold a coffee cup. She has had significant weight loss as well. She does endorse depression and anxiety but has been followed closely by PCP and decline medications for this. She denies difficulty swallowing. No changes in gait. She denies changes in movements with exception of tremors. No family history of PD. Symptoms have progressively worsened since starting divalproex  in May 2020. No seizure activity. She does not drive. PCP started Megace  last month. Headaches are well managed at this time.    09/06/2018 ALL:  Kayla Shah is a 68 y.o. female here today for follow up for seizure. She was admitted to [facility] on 06/12/2018 following two seizures at home. Depakote  was subtherapeutic on admission. She denies missed doses. Dose was increased to 750mg  twice daily. She has noticed an increase in tremor since increasing dose but is tolerating well otherwise. She denies seizure activity. She is feeling well today and without complaints.    Upon review of ER notes, she had elevated troponin and abnormal EKG (inferior ST depression). Cardiology consulted and advised additional testing. She has extensive family history of cardiovascular disease. She is treated for HTN and told that lipids are elevated (not currently treated). She is smoking with no desire to quit.    HISTORY: (copied from Dr Chancy note on 11/18/2017)   68 year old female here for evaluation of seizure  and migraine.   Patient first developed seizures in 27s.  She was on Keppra  at that time.  At some point she was taken off of Keppra .  2014 she was hospitalized for seizure.  She may have been on and off of Keppra  following 2014 but she is not sure.  Patient reports grand  mal seizures and postictal confusion symptoms but is not quite sure about the frequency.  She had another seizure possibly in September 2019.  She is not on antiseizure medication at this time.   Patient has history of migraine headaches at age 63 years old with pounding throbbing headaches, nausea, photophobia, typically unilateral right greater than left-sided headaches.  She averages 4-5 headaches per month.  She is not sure what type of medication she has been on the past.   Patient also has history of depression, anxiety, stress, weight loss, chronic pain.    REVIEW OF SYSTEMS: Out of a complete 14 system review of symptoms, the patient complains only of the following symptoms, chronic pain and all other reviewed systems are negative.   ALLERGIES: Allergies  Allergen Reactions   Codeine  Nausea And Vomiting   Other     No Anti Depressants - makes me crazy   Fluoxetine Anxiety   Paroxetine Anxiety    Becomes very aggressive with mood swings.    HOME MEDICATIONS: Outpatient Medications Prior to Visit  Medication Sig Dispense Refill   lamoTRIgine  (LAMICTAL ) 100 MG tablet Take 1 tablet (100 mg total) by mouth 2 (two) times daily. 180 tablet 3   ondansetron  (ZOFRAN ) 4 MG tablet TAKE 1 TABLET BY MOUTH EVERY 8 HOURS AS NEEDED FOR NAUSEA OR VOMITING 20 tablet 0   pantoprazole  (PROTONIX ) 40 MG tablet Take 1 tablet (40 mg total) by mouth daily. 90 tablet 0   rizatriptan  (MAXALT -MLT) 10 MG disintegrating tablet Take 1 tablet (10 mg total) by mouth as needed for migraine. May repeat in 2 hours if needed 9 tablet 11   simvastatin  (ZOCOR ) 80 MG tablet TAKE 1 TABLET BY MOUTH DAILY 15 tablet 0   valsartan  (DIOVAN ) 40 MG tablet TAKE 1 TABLET BY MOUTH DAILY 90 tablet 0   verapamil  (CALAN -SR) 240 MG CR tablet TAKE 1 TABLET BY MOUTH DAILY 90 tablet 0   vitamin B-12 (CYANOCOBALAMIN ) 500 MCG tablet Take 500 mcg by mouth daily.     No facility-administered medications prior to visit.    PAST  MEDICAL HISTORY: Past Medical History:  Diagnosis Date   Allergy    Anxiety    agoraphobia    Arthritis    Cataract    Chicken pox    COPD (chronic obstructive pulmonary disease) (HCC)    Depression    Family history of adverse reaction to anesthesia    PONV   GERD (gastroesophageal reflux disease)    Headache    History of blood transfusion    1984   Hypertension    Migraine    Osteoporosis    PONV (postoperative nausea and vomiting)    Seizure (HCC)    last seizure 04/2019    PAST SURGICAL HISTORY: Past Surgical History:  Procedure Laterality Date   APPENDECTOMY  1984   CATARACT EXTRACTION, BILATERAL Bilateral    COLONOSCOPY WITH PROPOFOL  N/A 03/26/2017   Procedure: COLONOSCOPY WITH PROPOFOL ;  Surgeon: Therisa Bi, MD;  Location: [facility] END[facility]COPY;  Service: Gastroenterology;  Laterality: N/A;   EYE SURGERY     HERNIA REPAIR     JOINT REPLACEMENT     TOTAL  ABDOMINAL HYSTERECTOMY  1984   Total   TOTAL HIP ARTHROPLASTY Right 11/19/2020   Procedure: RIGHT TOTAL HIP ARTHROPLASTY ANTERIOR APPROACH;  Surgeon: Jerri Kay HERO, MD;  Location: [facility] OR;  Service: Orthopedics;  Laterality: Right;  C-3   VENTRAL HERNIA REPAIR N/A 09/16/2019   Procedure: HERNIA REPAIR VENTRAL ADULT, Open;  Surgeon: Marolyn Nest, MD;  Location: [facility] ORS;  Service: General;  Laterality: N/A;   WISDOM TOOTH EXTRACTION      FAMILY HISTORY: Family History  Problem Relation Age of Onset   Stroke Mother    Depression Mother    Arthritis Mother    Heart disease Father    Alcohol abuse Father    Diabetes Father    Osteoporosis Sister    Kidney disease Sister    Arthritis Sister    Dementia Sister    Arthritis Maternal Grandmother    Heart disease Maternal Grandmother    Hyperlipidemia Maternal Grandmother    Diabetes Paternal Grandmother    Breast cancer Neg Hx     SOCIAL HISTORY: Social History   Socioeconomic History   Marital status: Divorced    Spouse name: Ozell   Number of  children: 0   Years of education: 12   Highest education level: Not on file  Occupational History    Comment: disabled  Tobacco Use   Smoking status: Every Day    Current packs/day: 1.00    Average packs/day: 1 pack/day for 45.0 years (45.0 ttl pk-yrs)    Types: Cigarettes   Smokeless tobacco: Never   Tobacco comments:    reports quitting in feb 2021  Vaping Use   Vaping status: Never Used  Substance and Sexual Activity   Alcohol use: No   Drug use: Yes    Types: Marijuana    Comment: every day   Sexual activity: Never  Other Topics Concern   Not on file  Social History Narrative   Disabled    Married no kids    No guns, wears seat belt, feels safe in relationship    12th grade ed.    Social Drivers of Health   Tobacco Use: High Risk (08/28/2023)   Patient History    Smoking Tobacco Use: Every Day    Smokeless Tobacco Use: Never    Passive Exposure: Not on file  Financial Resource Strain: Medium Risk (09/17/2021)   Overall Financial Resource Strain (CARDIA)    Difficulty of Paying Living Expenses: Somewhat hard  Food Insecurity: Unknown (09/17/2021)   Hunger Vital Sign    Worried About Running Out of Food in the Last Year: Not on file    Ran Out of Food in the Last Year: Never true  Transportation Needs: No Transportation Needs (02/18/2021)   PRAPARE - Administrator, Civil Service (Medical): No    Lack of Transportation (Non-Medical): No  Physical Activity: Sufficiently Active (02/18/2021)   Exercise Vital Sign    Days of Exercise per Week: 4 days    Minutes of Exercise per Session: 40 min  Stress: Stress Concern Present (09/17/2021)   [employer] of Occupational Health - Occupational Stress Questionnaire    Feeling of Stress : Rather much  Social Connections: Socially Isolated (02/18/2021)   Social Connection and Isolation Panel    Frequency of Communication with Friends and Family: More than three times a week    Frequency of Social Gatherings  with Friends and Family: Three times a week    Attends Religious Services: Never  Active Member of Clubs or Organizations: No    Attends Banker Meetings: Never    Marital Status: Divorced  Catering Manager Violence: Not At Risk (02/18/2021)   Humiliation, Afraid, Rape, and Kick questionnaire    Fear of Current or Ex-Partner: No    Emotionally Abused: No    Physically Abused: No    Sexually Abused: No  Depression (PHQ2-9): Low Risk (08/24/2023)   Depression (PHQ2-9)    PHQ-2 Score: 0  Alcohol Screen: Low Risk (02/18/2021)   Alcohol Screen    Last Alcohol Screening Score (AUDIT): 0  Housing: Low Risk (09/17/2021)   Housing    Last Housing Risk Score: 0  Utilities: Not on file  Health Literacy: Not on file      PHYSICAL EXAM  There were no vitals filed for this visit.     There is no height or weight on file to calculate BMI.  Generalized: Well developed, in no acute distress  Cardiology: normal rate and rhythm, no murmur noted Respiratory: clear to auscultation bilaterally  Neurological examination  Mentation: Alert oriented to time, place, history taking. Follows all commands speech and language fluent Cranial nerve II-XII: Pupils were equal round reactive to light. Extraocular movements were full, visual field were full  Motor: The motor testing reveals 5 over 5 strength of all 4 extremities. Good symmetric motor tone is noted throughout.  Gait and station: Gait is arthritic, slow and cautious with cane.   DIAGNOSTIC DATA (LABS, IMAGING, TESTING) - I reviewed patient records, labs, notes, testing and imaging myself where available.      No data to display           Lab Results  Component Value Date   WBC 6.0 12/11/2022   HGB 12.5 12/11/2022   HCT 38.0 12/11/2022   MCV 101 (H) 12/11/2022   PLT 231 12/11/2022      Component Value Date/Time   NA 140 12/11/2022 1450   NA 139 08/28/2013 1805   K 4.0 12/11/2022 1450   K 3.5 08/28/2013 1805    CL 99 12/11/2022 1450   CL 101 08/28/2013 1805   CO2 27 12/11/2022 1450   CO2 34 (H) 08/28/2013 1805   GLUCOSE 88 12/11/2022 1450   GLUCOSE 129 (H) 11/20/2020 0511   GLUCOSE 91 08/28/2013 1805   BUN 11 12/11/2022 1450   BUN 13 08/28/2013 1805   CREATININE 1.03 (H) 12/11/2022 1450   CREATININE 1.27 08/28/2013 1805   CALCIUM 9.1 12/11/2022 1450   CALCIUM 8.8 08/28/2013 1805   PROT 6.4 12/11/2022 1450   PROT 7.8 08/28/2013 1805   ALBUMIN 4.1 12/11/2022 1450   ALBUMIN 4.2 08/28/2013 1805   AST 11 12/11/2022 1450   AST 23 08/28/2013 1805   ALT 7 12/11/2022 1450   ALT 16 08/28/2013 1805   ALKPHOS 70 12/11/2022 1450   ALKPHOS 64 08/28/2013 1805   BILITOT <0.2 12/11/2022 1450   BILITOT 0.3 08/28/2013 1805   GFRNONAA >60 11/20/2020 0511   GFRNONAA 47 (L) 08/28/2013 1805   GFRAA 83 06/28/2019 1312   GFRAA 54 (L) 08/28/2013 1805   Lab Results  Component Value Date   CHOL 181 12/11/2022   HDL 61 12/11/2022   LDLCALC 77 12/11/2022   TRIG 267 (H) 12/11/2022   CHOLHDL 3.0 12/11/2022   Lab Results  Component Value Date   HGBA1C 5.3 12/11/2022   Lab Results  Component Value Date   VITAMINB12 1,807 (H) 12/11/2022   Lab Results  Component  Value Date   TSH 1.800 12/11/2022       ASSESSMENT AND PLAN 68 y.o. year old female  has a past medical history of Allergy, Anxiety, Arthritis, Cataract, Chicken pox, COPD (chronic obstructive pulmonary disease) (HCC), Depression, Family history of adverse reaction to anesthesia, GERD (gastroesophageal reflux disease), Headache, History of blood transfusion, Hypertension, Migraine, Osteoporosis, PONV (postoperative nausea and vomiting), and Seizure (HCC). here with   No diagnosis found.   Is doing very well, today.  She has tolerated lamotrigine  100 mg twice daily.  We will continue current treatment plan. Labs reviewed in Epic. She will continue to use rizatriptan  as needed for migraine abortion. She is now a non-smoker. Healthy lifestyle  habits encouraged.  She will continue close follow-up with primary care.  She will follow-up with me in 1 year, sooner if needed.  She verbalizes understanding and agreement with this plan.   No orders of the defined types were placed in this encounter.     No orders of the defined types were placed in this encounter.     Greig Forbes, FNP-C 01/18/2024, 7:46 AM [facility] [address] [place], [place] [address] [phone]

## 2024-01-18 NOTE — Telephone Encounter (Signed)
 Pt cx, her sister passed this morning

## 2024-01-18 NOTE — Patient Instructions (Incomplete)
 Below is our plan:  We will continue lamotrigine  100mg  twice daily and rizatriptan  as needed   Please make sure you are consistent with timing of seizure medication. I recommend annual visit with primary care provider (PCP) for complete physical and routine blood work. I recommend daily intake of vitamin D  (400-800iu) and calcium (800-1000mg ) for bone health. Discuss Dexa screening with PCP.   According to [place] law, you can not drive unless you are seizure / syncope free for at least 6 months and under physician's care.  Please maintain precautions. Do not participate in activities where a loss of awareness could harm you or someone else. No swimming alone, no tub bathing, no hot tubs, no driving, no operating motorized vehicles (cars, ATVs, motocycles, etc), lawnmowers, power tools or firearms. No standing at heights, such as rooftops, ladders or stairs. Avoid hot objects such as stoves, heaters, open fires. Wear a helmet when riding a bicycle, scooter, skateboard, etc. and avoid areas of traffic. Set your water  heater to 120 degrees or less.  SUDEP is the sudden, unexpected death of someone with epilepsy, who was otherwise healthy. In SUDEP cases, no other cause of death is found when an autopsy is done. Each year, more than 1 in 1,000 people with epilepsy die from SUDEP. This is the leading cause of death in people with uncontrolled seizures. Until further answers are available, the best way to prevent SUDEP is to lower your risk by controlling seizures. Research has found that people with all types of epilepsy that experience convulsive seizures can be at risk.  Please make sure you are staying well hydrated. I recommend 50-60 ounces daily. Well balanced diet and regular exercise encouraged. Consistent sleep schedule with 6-8 hours recommended.   Please continue follow up with care team as directed.   Follow up with me in 1 year  You may receive a survey regarding today's visit. I encourage you  to leave honest feed back as I do use this information to improve patient care. Thank you for seeing me today!

## 2024-01-21 ENCOUNTER — Other Ambulatory Visit: Payer: Self-pay | Admitting: Physician Assistant

## 2024-01-21 DIAGNOSIS — I10 Essential (primary) hypertension: Secondary | ICD-10-CM

## 2024-01-26 ENCOUNTER — Other Ambulatory Visit: Payer: Self-pay | Admitting: Physician Assistant

## 2024-01-26 DIAGNOSIS — E7800 Pure hypercholesterolemia, unspecified: Secondary | ICD-10-CM

## 2024-02-10 ENCOUNTER — Other Ambulatory Visit: Payer: Self-pay | Admitting: Physician Assistant

## 2024-02-10 DIAGNOSIS — E7800 Pure hypercholesterolemia, unspecified: Secondary | ICD-10-CM

## 2024-02-17 ENCOUNTER — Ambulatory Visit

## 2024-02-18 ENCOUNTER — Ambulatory Visit: Payer: Self-pay | Admitting: Physician Assistant

## 2024-02-18 LAB — CBC WITH DIFFERENTIAL/PLATELET
Basophils Absolute: 0 x10E3/uL (ref 0.0–0.2)
Basos: 1 %
EOS (ABSOLUTE): 0.1 x10E3/uL (ref 0.0–0.4)
Eos: 1 %
Hematocrit: 38.4 % (ref 34.0–46.6)
Hemoglobin: 12.4 g/dL (ref 11.1–15.9)
Immature Grans (Abs): 0 x10E3/uL (ref 0.0–0.1)
Immature Granulocytes: 0 %
Lymphocytes Absolute: 2.9 x10E3/uL (ref 0.7–3.1)
Lymphs: 41 %
MCH: 32.9 pg (ref 26.6–33.0)
MCHC: 32.3 g/dL (ref 31.5–35.7)
MCV: 102 fL — ABNORMAL HIGH (ref 79–97)
Monocytes Absolute: 0.4 x10E3/uL (ref 0.1–0.9)
Monocytes: 6 %
Neutrophils Absolute: 3.6 x10E3/uL (ref 1.4–7.0)
Neutrophils: 51 %
Platelets: 224 x10E3/uL (ref 150–450)
RBC: 3.77 x10E6/uL (ref 3.77–5.28)
RDW: 12.3 % (ref 11.7–15.4)
WBC: 6.9 x10E3/uL (ref 3.4–10.8)

## 2024-02-18 LAB — COMPREHENSIVE METABOLIC PANEL WITH GFR
ALT: 5 IU/L (ref 0–32)
AST: 11 IU/L (ref 0–40)
Albumin: 4.5 g/dL (ref 3.9–4.9)
Alkaline Phosphatase: 64 IU/L (ref 49–135)
BUN/Creatinine Ratio: 9 — ABNORMAL LOW (ref 12–28)
BUN: 11 mg/dL (ref 8–27)
Bilirubin Total: <0.2 mg/dL (ref 0.0–1.2)
CO2: 26 mmol/L (ref 20–29)
Calcium: 9.7 mg/dL (ref 8.7–10.3)
Chloride: 97 mmol/L (ref 96–106)
Creatinine, Ser: 1.16 mg/dL — ABNORMAL HIGH (ref 0.57–1.00)
Globulin, Total: 2.5 g/dL (ref 1.5–4.5)
Glucose: 66 mg/dL — ABNORMAL LOW (ref 70–99)
Potassium: 3.4 mmol/L — ABNORMAL LOW (ref 3.5–5.2)
Sodium: 141 mmol/L (ref 134–144)
Total Protein: 7 g/dL (ref 6.0–8.5)
eGFR: 52 mL/min/1.73 — ABNORMAL LOW

## 2024-02-18 LAB — LIPID PANEL
Chol/HDL Ratio: 2.7 ratio (ref 0.0–4.4)
Cholesterol, Total: 165 mg/dL (ref 100–199)
HDL: 62 mg/dL
LDL Chol Calc (NIH): 84 mg/dL (ref 0–99)
Triglycerides: 109 mg/dL (ref 0–149)
VLDL Cholesterol Cal: 19 mg/dL (ref 5–40)

## 2024-02-19 ENCOUNTER — Ambulatory Visit: Admitting: Physician Assistant

## 2024-02-19 DIAGNOSIS — I700 Atherosclerosis of aorta: Secondary | ICD-10-CM

## 2024-02-19 DIAGNOSIS — F32A Depression, unspecified: Secondary | ICD-10-CM

## 2024-02-19 DIAGNOSIS — M810 Age-related osteoporosis without current pathological fracture: Secondary | ICD-10-CM

## 2024-02-19 DIAGNOSIS — I5022 Chronic systolic (congestive) heart failure: Secondary | ICD-10-CM

## 2024-02-19 DIAGNOSIS — E7800 Pure hypercholesterolemia, unspecified: Secondary | ICD-10-CM

## 2024-02-19 DIAGNOSIS — Z23 Encounter for immunization: Secondary | ICD-10-CM

## 2024-02-19 DIAGNOSIS — K219 Gastro-esophageal reflux disease without esophagitis: Secondary | ICD-10-CM

## 2024-02-19 DIAGNOSIS — R569 Unspecified convulsions: Secondary | ICD-10-CM

## 2024-02-19 DIAGNOSIS — E538 Deficiency of other specified B group vitamins: Secondary | ICD-10-CM

## 2024-02-19 DIAGNOSIS — J4489 Other specified chronic obstructive pulmonary disease: Secondary | ICD-10-CM

## 2024-02-19 DIAGNOSIS — I10 Essential (primary) hypertension: Secondary | ICD-10-CM

## 2024-02-23 NOTE — Progress Notes (Signed)
 All labs are stable except Slightly enlarged red blood cells. Will need to repeat at the follow-up and do some extra tests. Slightly decreased potassium. Advised to consume potassium rich food. Decreased kidney function. Drink plenty of water , avoid taking nsaids/ibuprofen , aleve . Decreased blood sugar. Needs to recheck blood sugar and follow-up with patient.

## 2024-02-26 ENCOUNTER — Other Ambulatory Visit: Payer: Self-pay | Admitting: Physician Assistant

## 2024-02-26 DIAGNOSIS — E7800 Pure hypercholesterolemia, unspecified: Secondary | ICD-10-CM

## 2024-04-01 ENCOUNTER — Ambulatory Visit: Admitting: Physician Assistant

## 2024-05-18 ENCOUNTER — Ambulatory Visit
